# Patient Record
Sex: Male | Born: 1945 | Race: Black or African American | Hispanic: No | Smoking: Never smoker
Health system: Southern US, Community
[De-identification: ages and names within clinical notes are randomized; demographics above are authoritative.]

## PROBLEM LIST (undated history)

## (undated) DIAGNOSIS — G35D Multiple sclerosis, unspecified: Secondary | ICD-10-CM

## (undated) DIAGNOSIS — H409 Unspecified glaucoma: Secondary | ICD-10-CM

## (undated) DIAGNOSIS — F329 Major depressive disorder, single episode, unspecified: Secondary | ICD-10-CM

## (undated) DIAGNOSIS — I10 Essential (primary) hypertension: Secondary | ICD-10-CM

## (undated) DIAGNOSIS — B20 Human immunodeficiency virus [HIV] disease: Secondary | ICD-10-CM

## (undated) DIAGNOSIS — F32A Depression, unspecified: Secondary | ICD-10-CM

## (undated) DIAGNOSIS — Z21 Asymptomatic human immunodeficiency virus [HIV] infection status: Secondary | ICD-10-CM

## (undated) DIAGNOSIS — E781 Pure hyperglyceridemia: Secondary | ICD-10-CM

## (undated) DIAGNOSIS — R748 Abnormal levels of other serum enzymes: Secondary | ICD-10-CM

## (undated) DIAGNOSIS — M1990 Unspecified osteoarthritis, unspecified site: Secondary | ICD-10-CM

## (undated) DIAGNOSIS — Z8739 Personal history of other diseases of the musculoskeletal system and connective tissue: Secondary | ICD-10-CM

## (undated) DIAGNOSIS — I219 Acute myocardial infarction, unspecified: Secondary | ICD-10-CM

## (undated) DIAGNOSIS — G35 Multiple sclerosis: Secondary | ICD-10-CM

## (undated) DIAGNOSIS — F419 Anxiety disorder, unspecified: Secondary | ICD-10-CM

## (undated) DIAGNOSIS — B451 Cerebral cryptococcosis: Secondary | ICD-10-CM

## (undated) DIAGNOSIS — C61 Malignant neoplasm of prostate: Secondary | ICD-10-CM

## (undated) DIAGNOSIS — D72819 Decreased white blood cell count, unspecified: Secondary | ICD-10-CM

## (undated) HISTORY — DX: Cerebral cryptococcosis: B45.1

## (undated) HISTORY — DX: Pure hyperglyceridemia: E78.1

## (undated) HISTORY — PX: NECK SURGERY: SHX720

## (undated) HISTORY — DX: Abnormal levels of other serum enzymes: R74.8

## (undated) HISTORY — PX: PROSTATE BIOPSY: SHX241

## (undated) HISTORY — DX: Malignant neoplasm of prostate: C61

## (undated) HISTORY — DX: Decreased white blood cell count, unspecified: D72.819

---

## 2001-03-15 ENCOUNTER — Inpatient Hospital Stay (HOSPITAL_COMMUNITY): Admission: EM | Admit: 2001-03-15 | Discharge: 2001-03-16 | Payer: Self-pay | Admitting: *Deleted

## 2001-03-15 ENCOUNTER — Encounter: Payer: Self-pay | Admitting: *Deleted

## 2001-06-16 ENCOUNTER — Ambulatory Visit (HOSPITAL_COMMUNITY): Admission: RE | Admit: 2001-06-16 | Discharge: 2001-06-17 | Payer: Self-pay | Admitting: Family Medicine

## 2001-06-17 ENCOUNTER — Encounter: Payer: Self-pay | Admitting: Family Medicine

## 2003-03-16 ENCOUNTER — Ambulatory Visit (HOSPITAL_COMMUNITY): Admission: RE | Admit: 2003-03-16 | Discharge: 2003-03-16 | Payer: Self-pay | Admitting: Family Medicine

## 2003-03-16 ENCOUNTER — Encounter: Payer: Self-pay | Admitting: Family Medicine

## 2004-01-05 ENCOUNTER — Ambulatory Visit (HOSPITAL_COMMUNITY): Admission: RE | Admit: 2004-01-05 | Discharge: 2004-01-05 | Payer: Self-pay | Admitting: Internal Medicine

## 2005-03-16 ENCOUNTER — Emergency Department (HOSPITAL_COMMUNITY): Admission: EM | Admit: 2005-03-16 | Discharge: 2005-03-16 | Payer: Self-pay | Admitting: Emergency Medicine

## 2005-11-01 ENCOUNTER — Encounter (INDEPENDENT_AMBULATORY_CARE_PROVIDER_SITE_OTHER): Payer: Self-pay | Admitting: *Deleted

## 2005-11-01 ENCOUNTER — Other Ambulatory Visit: Admission: RE | Admit: 2005-11-01 | Discharge: 2005-11-01 | Payer: Self-pay | Admitting: Urology

## 2005-11-03 ENCOUNTER — Emergency Department (HOSPITAL_COMMUNITY): Admission: EM | Admit: 2005-11-03 | Discharge: 2005-11-03 | Payer: Self-pay | Admitting: Emergency Medicine

## 2006-01-03 ENCOUNTER — Emergency Department (HOSPITAL_COMMUNITY): Admission: EM | Admit: 2006-01-03 | Discharge: 2006-01-03 | Payer: Self-pay | Admitting: Emergency Medicine

## 2006-02-16 DIAGNOSIS — H469 Unspecified optic neuritis: Secondary | ICD-10-CM | POA: Insufficient documentation

## 2008-03-09 ENCOUNTER — Ambulatory Visit (HOSPITAL_COMMUNITY): Admission: RE | Admit: 2008-03-09 | Discharge: 2008-03-09 | Payer: Self-pay | Admitting: Family Medicine

## 2008-06-16 ENCOUNTER — Ambulatory Visit: Payer: Self-pay | Admitting: Gastroenterology

## 2008-06-16 LAB — CONVERTED CEMR LAB
ALT: 112 units/L — ABNORMAL HIGH (ref 0–53)
AST: 86 units/L — ABNORMAL HIGH (ref 0–37)
Absolute CD4: 297 #/uL — ABNORMAL LOW (ref 381–1469)
Albumin: 4.3 g/dL (ref 3.5–5.2)
Alkaline Phosphatase: 84 units/L (ref 39–117)
Anti Nuclear Antibody(ANA): NEGATIVE
Bilirubin, Direct: 0.1 mg/dL (ref 0.0–0.3)
CD4 T Helper %: 26 % — ABNORMAL LOW (ref 32–62)
Ferritin: 252 ng/mL (ref 22–322)
HCV Ab: NEGATIVE
Hep A Total Ab: NEGATIVE
Hepatitis B Surface Ag: NEGATIVE
Hepatitis B-Post: 5 milliintl units/mL
IgA: 398 mg/dL — ABNORMAL HIGH (ref 68–378)
IgG (Immunoglobin G), Serum: 1100 mg/dL (ref 694–1618)
IgM, Serum: 104 mg/dL (ref 60–263)
Indirect Bilirubin: 0.2 mg/dL (ref 0.0–0.9)
Iron: 98 ug/dL (ref 42–165)
Saturation Ratios: 23 % (ref 20–55)
TIBC: 418 ug/dL (ref 215–435)
Total Bilirubin: 0.3 mg/dL (ref 0.3–1.2)
Total Lymphocytes %: 44 % (ref 12–46)
Total Protein: 7.5 g/dL (ref 6.0–8.3)
Total lymphocyte count: 1144 cells/mcL (ref 700–3300)
UIBC: 320 ug/dL
WBC, lymph enumeration: 2.6 10*3/uL — ABNORMAL LOW (ref 4.0–10.5)

## 2008-06-23 ENCOUNTER — Ambulatory Visit: Payer: Self-pay | Admitting: Gastroenterology

## 2008-06-23 ENCOUNTER — Encounter: Payer: Self-pay | Admitting: Gastroenterology

## 2008-06-23 ENCOUNTER — Ambulatory Visit (HOSPITAL_COMMUNITY): Admission: RE | Admit: 2008-06-23 | Discharge: 2008-06-23 | Payer: Self-pay | Admitting: Gastroenterology

## 2008-06-23 HISTORY — PX: ESOPHAGOGASTRODUODENOSCOPY: SHX1529

## 2008-12-13 ENCOUNTER — Encounter: Payer: Self-pay | Admitting: Gastroenterology

## 2009-01-04 ENCOUNTER — Encounter: Payer: Self-pay | Admitting: Gastroenterology

## 2009-02-11 ENCOUNTER — Encounter (INDEPENDENT_AMBULATORY_CARE_PROVIDER_SITE_OTHER): Payer: Self-pay | Admitting: *Deleted

## 2009-02-14 DIAGNOSIS — Z8719 Personal history of other diseases of the digestive system: Secondary | ICD-10-CM | POA: Insufficient documentation

## 2009-02-14 DIAGNOSIS — H547 Unspecified visual loss: Secondary | ICD-10-CM | POA: Insufficient documentation

## 2009-02-14 DIAGNOSIS — M7150 Other bursitis, not elsewhere classified, unspecified site: Secondary | ICD-10-CM | POA: Insufficient documentation

## 2009-02-14 DIAGNOSIS — R7401 Elevation of levels of liver transaminase levels: Secondary | ICD-10-CM | POA: Insufficient documentation

## 2009-02-14 DIAGNOSIS — R1030 Lower abdominal pain, unspecified: Secondary | ICD-10-CM | POA: Insufficient documentation

## 2009-02-14 DIAGNOSIS — K5900 Constipation, unspecified: Secondary | ICD-10-CM | POA: Insufficient documentation

## 2009-02-14 DIAGNOSIS — I10 Essential (primary) hypertension: Secondary | ICD-10-CM | POA: Insufficient documentation

## 2009-02-14 DIAGNOSIS — R740 Nonspecific elevation of levels of transaminase and lactic acid dehydrogenase [LDH]: Secondary | ICD-10-CM

## 2009-02-14 DIAGNOSIS — K921 Melena: Secondary | ICD-10-CM | POA: Insufficient documentation

## 2009-02-14 DIAGNOSIS — B20 Human immunodeficiency virus [HIV] disease: Secondary | ICD-10-CM | POA: Insufficient documentation

## 2009-02-14 DIAGNOSIS — R109 Unspecified abdominal pain: Secondary | ICD-10-CM | POA: Insufficient documentation

## 2009-02-15 ENCOUNTER — Ambulatory Visit: Payer: Self-pay | Admitting: Gastroenterology

## 2009-02-15 DIAGNOSIS — K625 Hemorrhage of anus and rectum: Secondary | ICD-10-CM | POA: Insufficient documentation

## 2009-02-18 ENCOUNTER — Ambulatory Visit (HOSPITAL_COMMUNITY): Admission: RE | Admit: 2009-02-18 | Discharge: 2009-02-18 | Payer: Self-pay | Admitting: Psychiatry

## 2009-03-10 ENCOUNTER — Ambulatory Visit (HOSPITAL_COMMUNITY): Admission: RE | Admit: 2009-03-10 | Discharge: 2009-03-10 | Payer: Self-pay | Admitting: Gastroenterology

## 2009-03-10 ENCOUNTER — Ambulatory Visit: Payer: Self-pay | Admitting: Gastroenterology

## 2009-03-10 ENCOUNTER — Encounter: Payer: Self-pay | Admitting: Gastroenterology

## 2009-03-10 HISTORY — PX: COLONOSCOPY: SHX174

## 2009-03-14 ENCOUNTER — Encounter (INDEPENDENT_AMBULATORY_CARE_PROVIDER_SITE_OTHER): Payer: Self-pay

## 2009-09-05 ENCOUNTER — Ambulatory Visit (HOSPITAL_COMMUNITY): Admission: RE | Admit: 2009-09-05 | Discharge: 2009-09-05 | Payer: Self-pay | Admitting: Family Medicine

## 2010-07-18 NOTE — Letter (Signed)
Summary: TCS ORDER  TCS ORDER   Imported By: Ave Filter 02/15/2009 13:28:21  _____________________________________________________________________  External Attachment:    Type:   Image     Comment:   External Document

## 2010-07-18 NOTE — Letter (Signed)
Summary: OFFICE  NOTES/LABS/[facility]  OFFICE  NOTES/[facility]S/[facility]   Imported By: Diana Eves 01/04/2009 11:02:31  _____________________________________________________________________  External Attachment:    Type:   Image     Comment:   External Document

## 2010-07-18 NOTE — Medication Information (Signed)
Summary: Kerry Solis rx  Kerry Solis rx   Imported By: Elinor Parkinson 12/13/2008 10:40:34  _____________________________________________________________________  External Attachment:    Type:   Image     Comment:   External Document  Appended Document: [name] rx - omeprazole    Prescriptions: OMEPRAZOLE 20 MG CPDR (OMEPRAZOLE) one by mouth 30 mins before breakfast  #30 x 11   Entered and Authorized by:   Leanna Battles. Dixon Boos   Signed by:   Leanna Battles Dixon Boos on 12/14/2008   Method used:   Electronically to        [facility] (retail)       [address]       [place], [place]  [address]       Ph: [phone]       Fax: [phone]   RxID:   [id number]

## 2010-07-18 NOTE — Letter (Signed)
Summary: Patient Notice, Colon Biopsy Results  [facility]  [address]   [place], [place] [address]   Phone: [phone]  Fax: [phone]       March 14, 2009   Kerry Solis [address] [place], [place]  [address] 11-22-1945    Dear [name],  I am pleased to inform you that the biopsies taken during your recent colonoscopy did not show any evidence of cancer upon pathologic examination.  Additional information/recommendations:   __You should have a repeat colonoscopy examination  in 5 years.  Please call us if you are having persistent problems or have questions about your condition that have not been fully answered at this time.  Sincerely,  Hendricks Limes LPN  [facility] Ph: [phone]    Fax: [phone]

## 2010-07-18 NOTE — Letter (Signed)
Summary: Generic Letter, Intro to Referring  [facility]  [address]   [place], [place] [address]   Phone: [phone]  Fax: [phone]      February 11, 2009             RE: Kerry Solis   1945-07-01                 [address]                 [place], [place]  [address]  Dear Appt Secretary,   This pt has been scheduled an appt for 02/15/2009 @ 10:00 w/ Tana Coast, PA.   Lmom with appt.          Sincerely,    Elinor Parkinson  [facility] Ph: [phone]   Fax: [phone]

## 2010-07-18 NOTE — Assessment & Plan Note (Signed)
Summary: ABD PAIN,CONSTIPATION,HEME+STOOL,CONSULT FOR COLONOSCOPY/AS   Visit Type:  f/u Referring Provider:  Lilyan Punt Primary Care Provider:  Lilyan Punt  Chief Complaint:  abd pain/constipation/blood in stool.  History of Present Illness: Patient is here at the request of Dr. Lilyan Punt for further evaluation of recent blood in the stool and constipation/abdominal pain. He had one episode of blood in the stool on August 24. He had been having some constipation and mild lower abdominal pain for about one week prior to this episode. Since this episode his stools have been soft and these had no further bleeding. He generally drinks milk to facilitate bowel movements. He has some mild lactose intolerance. His family history significant for colon cancer in 2 brothers. We tried to triage him for colonoscopy on February 02, 2009 but at that time he was not appear to schedule. His last colonoscopy was in 2005 by Dr. Karilyn Cota. He had 2 small diverticula in the sigmoid colon and external hemorrhoids.  He denies any problems swallowing, anorexia, heartburn, vomiting, melena. He continues to take naproxen every few days for his shoulder pain or leg pain. He no longer takes Advil on a regular basis. He takes aspirin every day.  Current Medications (verified): 1)  Omeprazole 20 Mg Cpdr (Omeprazole) .... One By Mouth 30 Mins Before Breakfast 2)  Alprazolam 0.25 Mg Tabs (Alprazolam) .... Two Times A Day As Needed 3)  Hydrocodone-Acetaminophen 5-500 Mg Tabs (Hydrocodone-Acetaminophen) .... Every 4 Hours As Needed 4)  Naproxen 500 Mg Tabs (Naproxen) .... Two Times A Day 5)  Lisinopril 5 Mg Tabs (Lisinopril) .... As Directed 6)  Amitriptyline Hcl 25 Mg Tabs (Amitriptyline Hcl) .... Take 2 At Bedtime 7)  Vitamin B-6 100 Mg Tabs (Pyridoxine Hcl) .... Once Daily 8)  Hydrochlorothiazide 25 Mg Tabs (Hydrochlorothiazide) .... Once Daily 9)  Multivitamins  Tabs (Multiple Vitamin) .... Once Daily 10)  Folic Acid  400 Mcg Tabs (Folic Acid) .... Once Daily 11)  Aspirin 81 Mg Tabs (Aspirin) .... Once Daily 12)  Advil 200 Mg Tabs (Ibuprofen) .... As Needed 13)  Rebif 22 Mcg/0.78ml Soln (Interferon Beta-1a) .... Take 3 Times A Week 14)  Fish Oil 1000 Mg .... Take Two Tablets in The Am 15)  Allopurinol 300 Mg Tabs (Allopurinol) .... Take 1 Tablet By Mouth Once A Day 16)  Atripla 600-200-300 Mg Tabs (Efavirenz-Emtricitab-Tenofovir) .... 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 Cira Servant, patent Schatzki ring, moderate gastritis with small ulceration, path negative for H.Pylori and c/w chronic gastritis. Gallstones Right shoulder bursitis  Family History: 2 brothers with colon cancer, multiple family members with colon polyps. Sister with liver disease.  Social History: Marital Status:Divorced No tob, alcohol, drug use.  Review of Systems General:  Denies fever, chills, sweats, fatigue, weakness, and weight loss. Eyes:  Complains of vision loss; chronic left eye. ENT:  Denies nasal congestion, loss of smell, hoarseness, and difficulty swallowing. CV:  Denies chest pains, angina, palpitations, dyspnea on exertion, and peripheral edema. Resp:  Denies dyspnea at rest, dyspnea with exercise, and cough. GI:  See HPI. GU:  Denies urinary burning and blood in urine. MS:  Complains of joint pain / LOM and shoulder pain / LOM hand / wrist pain (CTS). Derm:  Denies rash and itching. Neuro:  Complains  of weakness; denies paralysis, frequent headaches, memory loss, and confusion. Psych:  Denies depression and anxiety. Endo:  Denies unusual weight change. Heme:  Complains of bleeding; denies bruising. Allergy:  Denies hives.  Vital Signs:  Patient  profile:   65 year old male Height:      69 inches Weight:      194 pounds BMI:     28.75 Temp:     97.9 degrees F oral Pulse rate:   72 / minute BP sitting:   140 / 90  (left arm) Cuff size:   regular  Vitals Entered By: Cloria Spring LPN (February 15, 2009 9:57 AM)  Physical Exam  General:  Well developed, well nourished, no acute distress. Head:  Normocephalic and atraumatic. Eyes:  Conjunctivae pink, no scleral icterus.  Mouth:  Oropharyngeal mucosa moist, pink.  No lesions, erythema or exudate.    Neck:  Supple; no masses or thyromegaly. Lungs:  Clear throughout to auscultation. Heart:  Regular rate and rhythm; no murmurs, rubs,  or bruits. Abdomen:  Bowel sounds normal.  Abdomen is soft, nontender, nondistended.  No rebound or guarding.  No hepatosplenomegaly, masses or hernias.  No abdominal bruits.  Extremities:  No clubbing, cyanosis, edema or deformities noted. Neurologic:  Alert and  oriented x4;  grossly normal neurologically. Skin:  Intact without significant lesions or rashes. Psych:  Alert and cooperative. Normal mood and affect.  Impression & Recommendations:  Problem # 1:  RECTAL BLEEDING (ICD-569.3)  recent rectal bleeding, single episode, in the setting of constipation. Constipation and abdominal pain have resolved. Family history significant for colon cancer in 2 first-degree relatives. Patient is due for high risk screening colonoscopy. Given recent events would pursue diagnostic colonoscopy at this time. He likely had bleeding from benign source such as hemorrhoids or fissure.  Colonoscopy to be performed in near future.  Risks, alternatives, and benefits including but not limited to the risk of reaction to medication, bleeding, infection, and perforation were addressed.  Patient voiced understanding and provided verbal consent.   Orders: Est. Patient Level IV ([address])  Problem # 2:  CONSTIPATION (ICD-564.00)  encouraged high fiber diet. He may use Colace 100  mg b.i.d. as needed for MiraLax 17 g daily p.r.n.  Orders: Est. Patient Level IV ([address])  Problem # 3:  LIVER FUNCTION TESTS, ABNORMAL, HX OF (ICD-V12.2)  he has history of elevated transaminases. He is due for recheck now. He likely has increase in transaminases secondary to steatosis and/or change in his antiviral medication for HIV. Labs from December 2009 showed lack of immunity for hepatitis A or hepatitis B. Patient does not recall whether or not he completed hepatitis B vaccination previously, however his hepatitis B surface antibody level was only 5 (level greater than 10 suggest immunity). He has an upcoming appointment at [facility] for followup of his HIV. I've asked that he verify whether or not he has been vaccinated previously for hepatitis B and if he has underwent vaccination to discuss whether or not he should be revaccinated. He could have vaccination done through his local PCP for hepatitis A and B. if needed.  Orders: Est. Patient Level IV ([address]) T-Hepatic Function ([id number]) CC:  Dr. Demetria Pore at [facility]  Appended Document: ABD PAIN,CONSTIPATION,HEME+STOOL,CONSULT FOR COLONOSCOPY/AS called pt to get him to have labs done. he stated he just had them done at Dr. Cathlyn Parsons office and at [facility]. He stated he would call and have them send [place] a copy to see if he needed it  done again.   Appended Document: ABD PAIN,CONSTIPATION,HEME+STOOL,CONSULT FOR COLONOSCOPY/AS Please f/u.  We have not received labs from PCP or [facility].  Let's call for most recent labs.  Appended Document: ABD PAIN,CONSTIPATION,HEME+STOOL,CONSULT FOR COLONOSCOPY/AS Labs received from PCP were old.  Patient says he had bw done at [facility] in the last two weeks.  Let's see if we can get a copy from them.  He says they are taking care of the hep a and b vaccines at [facility].  Appended Document: ABD PAIN,CONSTIPATION,HEME+STOOL,CONSULT FOR COLONOSCOPY/AS I've requested labs from [facility] again. Sent request as ASAP  Appended  Document: ABD PAIN,CONSTIPATION,HEME+STOOL,CONSULT FOR COLONOSCOPY/AS I called pt to let him know that I needed a signed release of info in order to get [facility] records sent to [place]. He told me to call "Dr Lorin Picket" that he had everything that I needed on him. I called Dr Fletcher Anon office and they are not able to release records that didnt originate from their practice.Marland KitchenMarland KitchenI instead mailed Mr Bottger a release for him to sign.

## 2010-10-31 NOTE — Consult Note (Signed)
NAME:  Kerry Solis, Kerry Solis               ACCOUNT NO.:  [id number]   MEDICAL RECORD NO.:  [id number]          PATIENT TYPE:  AMB   LOCATION:  DAY                           FACILITY:  [facility]   PHYSICIAN:  Kassie Mends, M.D.      DATE OF BIRTH:  1945-08-01   DATE OF CONSULTATION:  DATE OF DISCHARGE:                                 CONSULTATION   REFERRING PHYSICIAN:  Scott A. Luking, MD.   REASON FOR CONSULTATION:  Abdominal pain and elevated liver enzymes.   HISTORY OF PRESENT ILLNESS:  Kerry Solis is a 65 year old male who was  diagnosed with HIV in 1998.  He is currently on antiretroviral therapy.  He reports that his CD4 count was greater than 200.  He has been having  problems with his teeth and when he has pain in his belly, he needs to  eat to make it go away.  He denies any nausea, vomiting, or problems of  swallowing.  He reports having a colonoscopy 2 years ago at [facility]  He denies any rectal bleeding or blood in his stool.  The abdominal pain is a soreness which is located around his navel.  It  does not radiate.  He has had the symptoms for 5-6 months.  He describes  them as a little worrisome due to the pain being more diffuse.  He has  1-2 bowel movements a day which are hard and he has to strain to get  them out.  Denies any heartburn or indigestion.  He does sometimes get a  burning sensation if he is hungry and when he eats, it gets better.  He  is on aspirin.  He uses a 2-8 Advil daily.  He is also taking naproxen.  He has a known history of an AST of 49 and an ALT of 42 in 2002.  In  2006, he had abdominal imaging, showed sigmoid colon diverticulosis.   PAST MEDICAL HISTORY:  1. Gallstones.  2. Right shoulder bursitis causing recurrent right shoulder pain.  3. 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  4. Hypertension.   PAST SURGICAL HISTORY:  None.   ALLERGIES:  No known drug allergies.   MEDICATIONS:  1. Fluconazole  200 mg daily.  2. Roxicet 5/325  daily.  3. Alprazolam.  4. Hydrocodone 5/500 every 4 hours as need.  5. Naproxen 500 mg b.i.d.  6. Lisinopril 2.5 mg daily.  7. Amitriptyline 50 mg every night.  8. Vitamin B6.  9. Hydrochlorothiazide 25 mg daily.  10.Multivitamin.  11.Folic acid.  12.Advil 2-8 per day.  13.Aspirin 81 mg daily.  14.Norvir 100 mg daily.  15.Rebif injection 3 times a week.   FAMILY HISTORY:  His sister had cystic liver disease.  He reports having  2 brothers with colon cancer.  He has a family history of colon polyps.   SOCIAL HISTORY:  He is divorced.  He used to work in Engineering geologist.  He has  never smoked.  He states he drinks a beer to help him go to sleep.  His  last consumption was a month  ago.  He denies any blood transfusion, IV  drug use, or snorting cocaine.   PHYSICAL EXAMINATION:  VITAL SIGNS:  Weight 260 pounds, height 55.9  inches, BMI 30.4 (obese ) temperature 97.9, blood pressure 142/90, and  pulse 80.  GENERAL:  He is in no apparent distress.  He is alert and oriented  x4.HEENT:  Atraumatic and normocephalic.  Pupils equal, react to light.  Mouth, no oral lesions.  Posterior oropharynx without erythema or  exudate.NECK:  Full range of ration.  No lymphadenopathy.LUNGS:  Clear  to auscultation bilaterally.  CARDIOVASCULAR:  Regular rhythm.  No murmur.  Normal S1 and S2.ABDOMEN:  He has mild tenderness to palpation in the epigastrium without rebound  or guarding.  EXTREMITIES:  No cyanosis or edema.NEUROLOGIC:  No focal neurologic  deficit.   LABORATORY DATA:  In November 2009, AST is 76 ( 0-37), ALT 88 (0-53),  albumin 4.4, total bilirubin 0.3, and alk phos is 79.   RADIOGRAPHIC STUDIES:  In September 2009, abdominal ultrasound showed  fatty liver disease and multiple gallstones.  Common bile duct is 2.7  mm.   ASSESSMENT:  Kerry Solis is a 65 year old male who has evidence of  hepatic steatosis on ultrasound and mildly elevated liver enzymes.  He  has steatohepatitis likely  secondary to multiple factors.  The factors  include:  periodic alcohol use, obesity, and chronic antiretroviral  therapy. Thank you for allowing me to see Mr. Wileman in consultation.  My recommendations follow.   RECOMMENDATION:  1. For his complaint of abdominal pain which was related by food and      the likelihood of H. pylori gastritis or peptic ulcer disease and a      low likelihood of gastric malignancy, he will be scheduled for an      EGD with biopsy next week.  2. He is to add omeprazole 20 mg daily.  3. I did advise him that he should discontinue the use of Advil.  4. He may continue the aspirin and the naproxen until his evaluation      for his abdominal pain is complete.  5. Will check hepatitis C antibody, hepatitis A, IgG, and hepatitis B      surface antigen.  He also will have quantitative immunoglobulin and      ANA and anti-smooth muscle antibody.  6. He has a follow up appointment to see me in 2 months.  7. He has 2 first degree relatives with colon cancer, and he should      have a screening colonoscopy every 5 years. He dclined to have a      colonoscopy at this time.  8. He also has constipation and he is given discharge instructions on      the management of constipation in a handout.  He is asked to drink      6-8 cups of water daily.  He should follow a high-fiber diet.  He      is given a      handout on a high-fiber diet.  He is asked to use Fibersure 2 times      a day.  He may add milk of magnesia or MiraLax once or twice a day      if he is not having a satisfactory bowel movement.  I also checked      his CD4 count.  I will obtain records from [facility] as well      as [facility]  Kassie Mends, M.D.  Electronically Signed     SM/MEDQ  D:  06/16/2008  T:  06/17/2008  Job:  [id number]   cc:   Lorin Picket A. Gerda Diss, MD  Fax: [phone]

## 2010-10-31 NOTE — Op Note (Signed)
NAME:  Kerry Solis, Kerry Solis               ACCOUNT NO.:  [id number]   MEDICAL RECORD NO.:  [id number]          PATIENT TYPE:  AMB   LOCATION:  DAY                           FACILITY:  [facility]   PHYSICIAN:  Kassie Mends, M.D.      DATE OF BIRTH:  [birth year]   DATE OF PROCEDURE:  06/23/2008  DATE OF DISCHARGE:                               OPERATIVE REPORT   REFERRING PHYSICIAN:  Scott A. Gerda Diss, MD   PROCEDURE:  Esophagogastroduodenoscopy with cold forceps biopsy.   INDICATIONS FOR EXAM:  Kerry Solis is a 65 year old male who presents  with abdominal pain, which is located in the center of his abdomen.  He  uses aspirin, Advil, and naproxen.  He occasionally consumes alcohol.  He has also seen as an outpatient for elevated liver enzymes with an AST  of 49 and ALT of 42 in 2002 and repeat liver enzymes in November 2009  showed an AST of 76 and an ALT of 88.  This lab evaluation included an  iron saturation of 23, total bili 0.3, alk phos 84, AST 86, ALT 112,  albumin 4.3, ferritin 252.  Hepatitis B surface antigen negative.  Total  hepatitis A antibody negative.  Hepatitis C antibody negative.  Hepatitis B surface antibody 5.0.  IgG 1100.  IgA slightly elevated at  398 (see 68-378), IgM 104.  ANA negative.  Absolute CD4 count 297.  Smooth muscle antibody IgG less than 20.   FINDINGS:  1. Patent Schatzki ring.  Otherwise, no evidence of Barrett's, mass,      erosions, or ulcerations in the esophagus.  2. Multiple gastric erosions and small ulcers seen in the proximal      stomach and in the antrum.  There was evidence of active oozing.      Biopsies were obtained via cold forceps to evaluate for H. Pylori      gastritis.  3. Mild erythema at the junction of D1 and D2.  Otherwise, the      duodenal bulb and the second portion of the duodenum were without      ulceration or erosion.   DIAGNOSES:  1. Moderate gastritis.  2. Patent Schatzki ring.   RECOMMENDATIONS:  1. He should continue  omeprazole indefinitely.  He is asked not to      take Advil or naproxen until July 08, 2008.  He should hold his      aspirin until July 25, 2008.  2. He should avoid alcohol completely and we will recheck his liver      enzymes when he follows up in 8 weeks for his abdominal pain.  If      his liver enzyme elevation persist, then we would consider liver      biopsy, although I think that he has steatohepatitis secondary to      obesity and anti-retroviral therapy.  3. It is okay for him to use Tylenol 650 mg every 6 hours as needed      for pain.   MEDICATIONS:  1. Demerol 50 mg IV.  2. Versed 4 mg IV.  PROCEDURE TECHNIQUE:  Physical exam was performed.  Informed consent was  obtained from the patient after explaining the benefits, risks, and  alternatives to the procedure.  The patient was connected to monitor and  placed in left lateral position.  Continuous oxygen was provided by  nasal cannula and IV medicine administered through an indwelling  cannula.  After administration of sedation, the patient's esophagus was  intubated and scope was advanced under direct visualization to the  second portion of the duodenum.  The scope was removed slowly by  carefully examining the color, texture, anatomy, and integrity of the  mucosa on the way out.  The patient was recovered in endoscopy and  discharged home in satisfactory condition.   PATH:  Chronic gastritis.      Kassie Mends, M.D.  Electronically Signed     SM/MEDQ  D:  06/23/2008  T:  06/23/2008  Job:  [id number]   cc:   Lorin Picket A. Gerda Diss, MD  Fax: [phone]   [facility]

## 2010-11-03 NOTE — Op Note (Signed)
NAME:  Kerry Solis, Kerry Solis                         ACCOUNT NO.:  [id number]   MEDICAL RECORD NO.:  [id number]                   PATIENT TYPE:  AMB   LOCATION:  DAY                                  FACILITY:  [facility]   PHYSICIAN:  Lionel December, M.D.                 DATE OF BIRTH:  1945/12/15   DATE OF PROCEDURE:  01/05/2004  DATE OF DISCHARGE:                                 OPERATIVE REPORT   PROCEDURE:  Total colonoscopy.   INDICATIONS:  Kerry Solis is a 65 year old African American male with a recent  history of constipation who has responded to a high fiber diet and Colace.  He is undergoing high risk colonoscopy.  His last exam was in 1997.  The  family history is positive for colon carcinoma in two of his older brothers.  One is deceased.   The procedure is reviewed with the patient.  Informed consent was obtained.  He has a family history of colon carcinoma.  Two of his brothers have had  this diagnosis.  He also has intermittent hematochezia felt to be secondary  to hemorrhoids.  The procedure is reviewed with the patient.  Informed  consent was obtained.   PREOPERATIVE MEDICATIONS:  Demerol 50 mg IV, Versed 5 mg IV.   FINDINGS:  The procedure is performed in the endoscopy suite.  The patient's  vital signs and O2 saturations were monitored during the procedure and  remained stable.  The patient was placed in the left lateral decubitus  position and the rectal exam was performed.  No abnormality noted on  external or digital exam.  The Olympus video scope was placed in the rectum  and advanced under direct vision into the sigmoid colon and beyond.  The  preparation was satisfactory.  Two small diverticula were noted at the  sigmoid colon.  The scope was advanced to the cecum which was identified by  the ileocecal valve and appendiceal orifice.  Pictures were taken for the  record.  As the scope was withdrawn, the colonic mucosa was once again  carefully examined.  There were no polyps  and/or tumor masses.  The rectal  mucosa was normal.  The scope was retroflexed to examine the anorectal  junction and small hemorrhoids were noted below the dentate line.  The  endoscope was straightened and withdrawn.  The patient tolerated the  procedure well.   FINAL DIAGNOSIS:  Two small diverticula at the sigmoid colon and external  hemorrhoids, otherwise normal colonoscopy.   RECOMMENDATIONS:  He should continue high fiber diet and Colace as before.  He should consider having the next screening exam in five years from now  given positive family history.      ___________________________________________  Lionel December, M.D.   NR/MEDQ  D:  01/05/2004  T:  01/05/2004  Job:  [id number]   cc:   Lorin Picket A. Gerda Diss, M.D.  [address]  [place]  [place] [address]  Fax: [phone]

## 2011-06-06 ENCOUNTER — Emergency Department (HOSPITAL_COMMUNITY): Payer: Medicare Other

## 2011-06-06 ENCOUNTER — Emergency Department (HOSPITAL_COMMUNITY)
Admission: EM | Admit: 2011-06-06 | Discharge: 2011-06-06 | Disposition: A | Discharge status: Home / Self Care | Payer: Medicare Other | Attending: Emergency Medicine | Admitting: Emergency Medicine

## 2011-06-06 ENCOUNTER — Encounter: Payer: Self-pay | Admitting: Emergency Medicine

## 2011-06-06 DIAGNOSIS — Z79899 Other long term (current) drug therapy: Secondary | ICD-10-CM | POA: Insufficient documentation

## 2011-06-06 DIAGNOSIS — G35 Multiple sclerosis: Secondary | ICD-10-CM | POA: Insufficient documentation

## 2011-06-06 DIAGNOSIS — I10 Essential (primary) hypertension: Secondary | ICD-10-CM | POA: Insufficient documentation

## 2011-06-06 DIAGNOSIS — K8020 Calculus of gallbladder without cholecystitis without obstruction: Secondary | ICD-10-CM | POA: Insufficient documentation

## 2011-06-06 DIAGNOSIS — K5730 Diverticulosis of large intestine without perforation or abscess without bleeding: Secondary | ICD-10-CM | POA: Insufficient documentation

## 2011-06-06 DIAGNOSIS — R109 Unspecified abdominal pain: Secondary | ICD-10-CM | POA: Insufficient documentation

## 2011-06-06 DIAGNOSIS — Z862 Personal history of diseases of the blood and blood-forming organs and certain disorders involving the immune mechanism: Secondary | ICD-10-CM

## 2011-06-06 DIAGNOSIS — K7689 Other specified diseases of liver: Secondary | ICD-10-CM | POA: Insufficient documentation

## 2011-06-06 DIAGNOSIS — B20 Human immunodeficiency virus [HIV] disease: Secondary | ICD-10-CM | POA: Insufficient documentation

## 2011-06-06 HISTORY — DX: Asymptomatic human immunodeficiency virus (hiv) infection status: Z21

## 2011-06-06 HISTORY — DX: Human immunodeficiency virus (HIV) disease: B20

## 2011-06-06 HISTORY — DX: Multiple sclerosis, unspecified: G35.D

## 2011-06-06 HISTORY — DX: Essential (primary) hypertension: I10

## 2011-06-06 HISTORY — DX: Multiple sclerosis: G35

## 2011-06-06 LAB — DIFFERENTIAL
Basophils Absolute: 0 10*3/uL (ref 0.0–0.1)
Basophils Relative: 0 % (ref 0–1)
Eosinophils Absolute: 0 10*3/uL (ref 0.0–0.7)
Eosinophils Relative: 1 % (ref 0–5)
Lymphocytes Relative: 20 % (ref 12–46)
Lymphs Abs: 0.9 10*3/uL (ref 0.7–4.0)
Monocytes Absolute: 0.4 10*3/uL (ref 0.1–1.0)
Monocytes Relative: 9 % (ref 3–12)
Neutro Abs: 3.2 10*3/uL (ref 1.7–7.7)
Neutrophils Relative %: 71 % (ref 43–77)

## 2011-06-06 LAB — COMPREHENSIVE METABOLIC PANEL
ALT: 64 U/L — ABNORMAL HIGH (ref 0–53)
AST: 72 U/L — ABNORMAL HIGH (ref 0–37)
Albumin: 4 g/dL (ref 3.5–5.2)
Alkaline Phosphatase: 100 U/L (ref 39–117)
BUN: 18 mg/dL (ref 6–23)
CO2: 26 mEq/L (ref 19–32)
Calcium: 10.1 mg/dL (ref 8.4–10.5)
Chloride: 102 mEq/L (ref 96–112)
Creatinine, Ser: 1 mg/dL (ref 0.50–1.35)
GFR calc Af Amer: 88 mL/min — ABNORMAL LOW (ref >90)
GFR calc non Af Amer: 76 mL/min — ABNORMAL LOW (ref >90)
Glucose, Bld: 157 mg/dL — ABNORMAL HIGH (ref 70–99)
Potassium: 3.7 mEq/L (ref 3.5–5.1)
Sodium: 138 mEq/L (ref 135–145)
Total Bilirubin: 0.2 mg/dL — ABNORMAL LOW (ref 0.3–1.2)
Total Protein: 8.2 g/dL (ref 6.0–8.3)

## 2011-06-06 LAB — URINALYSIS, ROUTINE W REFLEX MICROSCOPIC
Bilirubin Urine: NEGATIVE
Glucose, UA: NEGATIVE mg/dL
Hgb urine dipstick: NEGATIVE
Ketones, ur: NEGATIVE mg/dL
Leukocytes, UA: NEGATIVE
Nitrite: NEGATIVE
Protein, ur: NEGATIVE mg/dL
Specific Gravity, Urine: 1.015 (ref 1.005–1.030)
Urobilinogen, UA: 0.2 mg/dL (ref 0.0–1.0)
pH: 7 (ref 5.0–8.0)

## 2011-06-06 LAB — CBC
HCT: 39 % (ref 39.0–52.0)
Hemoglobin: 12.9 g/dL — ABNORMAL LOW (ref 13.0–17.0)
MCH: 30.4 pg (ref 26.0–34.0)
MCHC: 33.1 g/dL (ref 30.0–36.0)
MCV: 91.8 fL (ref 78.0–100.0)
Platelets: 214 10*3/uL (ref 150–400)
RBC: 4.25 MIL/uL (ref 4.22–5.81)
RDW: 14.7 % (ref 11.5–15.5)
WBC: 4.5 10*3/uL (ref 4.0–10.5)

## 2011-06-06 LAB — LIPASE, BLOOD: Lipase: 49 U/L (ref 11–59)

## 2011-06-06 MED ORDER — FAMOTIDINE IN NACL 20-0.9 MG/50ML-% IV SOLN
20.0000 mg | Freq: Once | INTRAVENOUS | Status: AC
  Administered 2011-06-06: 20 mg via INTRAVENOUS
  Filled 2011-06-06: qty 50

## 2011-06-06 MED ORDER — HYDROMORPHONE HCL PF 1 MG/ML IJ SOLN
1.0000 mg | Freq: Once | INTRAMUSCULAR | Status: AC
  Administered 2011-06-06: 1 mg via INTRAVENOUS
  Filled 2011-06-06: qty 1

## 2011-06-06 MED ORDER — ONDANSETRON HCL 4 MG/2ML IJ SOLN
4.0000 mg | Freq: Once | INTRAMUSCULAR | Status: AC
  Administered 2011-06-06: 4 mg via INTRAVENOUS
  Filled 2011-06-06: qty 2

## 2011-06-06 MED ORDER — ONDANSETRON HCL 4 MG PO TABS: 8.0000 mg | ORAL_TABLET | Freq: Four times a day (QID) | ORAL | Status: AC

## 2011-06-06 MED ORDER — IOHEXOL 300 MG/ML  SOLN
100.0000 mL | Freq: Once | INTRAMUSCULAR | Status: AC | PRN
  Administered 2011-06-06: 100 mL via INTRAVENOUS

## 2011-06-06 MED ORDER — SODIUM CHLORIDE 0.9 % IV SOLN: 999.0000 mL | INTRAVENOUS | Status: DC

## 2011-06-06 MED ORDER — MORPHINE SULFATE 4 MG/ML IJ SOLN
4.0000 mg | Freq: Once | INTRAMUSCULAR | Status: AC
  Administered 2011-06-06: 4 mg via INTRAVENOUS
  Filled 2011-06-06: qty 1

## 2011-06-06 MED ORDER — OXYCODONE-ACETAMINOPHEN 5-325 MG PO TABS: 2.0000 | ORAL_TABLET | ORAL | Status: DC | PRN

## 2011-06-06 NOTE — ED Notes (Signed)
Pt states abd pain started 12/17. Pain increased 12/18 along with nausea and vomiting that started at 2200

## 2011-06-06 NOTE — ED Provider Notes (Signed)
History     CSN: [id number] Arrival date & time: 06/06/2011 12:52 AM   First MD Initiated Contact with Patient 06/06/11 0128      Chief Complaint  Patient presents with  . Abdominal Pain  . Emesis    (Consider location/radiation/quality/duration/timing/severity/associated sxs/prior treatment) HPI  Past Medical History  Diagnosis Date  . Multiple sclerosis   . Hypertension   . HIV (human immunodeficiency virus infection)     History reviewed. No pertinent past surgical history.  History reviewed. No pertinent family history.  History  Substance Use Topics  . Smoking status: Never Smoker   . Smokeless tobacco: Not on file  . Alcohol Use: No      Review of Systems  Allergies  Review of patient's allergies indicates no known allergies.  Home Medications   Current Outpatient Rx  Name Route Sig Dispense Refill  . ALLOPURINOL 300 MG PO TABS Oral Take 300 mg by mouth daily.      Marland Kitchen AMITRIPTYLINE HCL 50 MG PO TABS Oral Take 50 mg by mouth at bedtime.      . ASPIRIN 81 MG PO TABS Oral Take 81 mg by mouth daily.      Marland Kitchen CARBAMAZEPINE ER 100 MG PO CP12 Oral Take 100 mg by mouth 2 (two) times daily.      . EFAVIRENZ-EMTRICITAB-TENOFOVIR 600-200-300 MG PO TABS Oral Take 1 tablet by mouth at bedtime.      . OMEGA-3 FATTY ACIDS 1000 MG PO CAPS Oral Take 2 g by mouth daily.      Marland Kitchen FOLIC ACID 400 MCG PO TABS Oral Take 400 mcg by mouth daily.      Marland Kitchen HYDROCHLOROTHIAZIDE 25 MG PO TABS Oral Take 25 mg by mouth daily.      . INTERFERON BETA-1B 0.3 MG [place] SOLR Subcutaneous Inject 44 mg into the skin every Monday, Wednesday, and Friday.      Marland Kitchen LISINOPRIL 10 MG PO TABS Oral Take 10 mg by mouth daily.      . CENTRUM PO CHEW Oral Chew 1 tablet by mouth daily.      Marland Kitchen OMEPRAZOLE 20 MG PO CPDR Oral Take 20 mg by mouth daily.      Marland Kitchen PRAVASTATIN SODIUM 20 MG PO TABS Oral Take 20 mg by mouth daily.      Marland Kitchen PYRIDOXINE HCL 100 MG PO TABS Oral Take 100 mg by mouth daily.      Marland Kitchen ONDANSETRON HCL 4  MG PO TABS Oral Take 2 tablets (8 mg total) by mouth every 6 (six) hours. 12 tablet 0  . OXYCODONE-ACETAMINOPHEN 5-325 MG PO TABS Oral Take 2 tablets by mouth every 4 (four) hours as needed for pain. 20 tablet 0    BP 174/90  Pulse 83  Temp(Src) 98.6 F (37 C) (Oral)  Resp 18  Ht 5\' 8"  (1.727 m)  Wt 210 lb (95.255 kg)  BMI 31.93 kg/m2  SpO2 95%  Physical Exam  ED Course  Procedures (including critical care time)  Labs Reviewed  CBC - Abnormal; Notable for the following:    Hemoglobin 12.9 (*)    All other components within normal limits  COMPREHENSIVE METABOLIC PANEL - Abnormal; Notable for the following:    Glucose, Bld 157 (*)    AST 72 (*)    ALT 64 (*)    Total Bilirubin 0.2 (*)    GFR calc non Af Amer 76 (*)    GFR calc Af Amer 88 (*)    All  other components within normal limits  URINALYSIS, ROUTINE W REFLEX MICROSCOPIC - Abnormal; Notable for the following:    Color, Urine AMBER (*) BIOCHEMICALS MAY BE AFFECTED BY COLOR   All other components within normal limits  DIFFERENTIAL  LIPASE, BLOOD   US Abdomen Complete  06/06/2011  *RADIOLOGY REPORT*  Clinical Data:  Gallstones.  Abdominal pain.  COMPLETE ABDOMINAL ULTRASOUND  Comparison:  CT abdomen and pelvis 06/06/2011 5:07 a.m.  Findings:  Gallbladder:  As on CT scan, a few small stones are identified within the gallbladder.  Sludge is also present.  There is some pericholecystic fluid.  Gallbladder wall  is thickened at 0.6 cm. Sonographer reports negative Murphy's sign.  Common bile duct:  Measures 0.5 cm.  Liver:  No focal lesion identified.  Within normal limits in parenchymal echogenicity.  IVC:  Appears normal.  Pancreas:  Not visualized due to overlying bowel gas.  Spleen:  Measures 5.3 cm and appears normal.  Right Kidney:  Measures 11.0 cm and appears normal.  Left Kidney:  Measures 10.7 cm and appears normal.  Abdominal aorta:  No aneurysm identified.  IMPRESSION: Despite reported negative sonographic Murphy's  sign, sludge and stones in the gallbladder with wall thickening and pericholecystic fluid are compatible with acute cholecystitis.  Original Report Authenticated By: Bernadene Bell. Maricela Curet, M.D.   Ct Abdomen Pelvis W Contrast  06/06/2011  *RADIOLOGY REPORT*  Clinical Data: Abdominal pain, emesis.  CT ABDOMEN AND PELVIS WITH CONTRAST  Technique:  Multidetector CT imaging of the abdomen and pelvis was performed following the standard protocol during bolus administration of intravenous contrast.  Contrast: OMNIPAQUE IOHEXOL 300 MG/ML IV SOLN  Comparison: 03/16/2005  Findings: Calcified nodule left lower lobe.  Heart size within normal limits.  Left hilar calcified lymph node.  Low attenuation of the liver is in keeping with fatty infiltration. There are several tiny gallstones.  No gallbladder wall thickening or pericholecystic fluid.  Unremarkable spleen, pancreas, adrenal glands.  Lobular renal contours.  Punctate calcification within the right kidney periphery is similar to prior.  No hydronephrosis or hydroureter.  Nonobstructing lower pole left renal stone.  Too small further characterize hypodensity of the lower pole of the left kidney.  No bowel obstruction.  Colonic diverticulosis without CT evidence for diverticulitis.  Appendix within normal limits.  No lymphadenopathy.  Thin-walled bladder.  Bilateral fat containing inguinal hernias.  There is scattered atherosclerotic calcification of the aorta and its branches. No aneurysmal dilatation.  No acute osseous abnormality.  IMPRESSION: No acute CT abnormality identified.  Hepatic steatosis.  Cholelithiasis.  Colonic diverticulosis.  Nonobstructing lower pole left renal stone.  Original Report Authenticated By: Waneta Martins, M.D.     1. Abdominal pain   2. HIV INFECTION   3. LIVER FUNCTION TESTS, ABNORMAL, HX OF       MDM  Recheck at 0900:  No acute abdomen. Feeling much better. Discussed ultrasound findings with general surgeon. We feel  patient can be discharged home.  Medications for pain and nausea. Will return if worse.        Donnetta Hutching, MD 06/06/11 [phone]

## 2011-06-06 NOTE — ED Notes (Signed)
Patient complaining of abdominal pain x 2 days, vomiting started tonight.

## 2011-06-06 NOTE — ED Provider Notes (Signed)
History     CSN: [id number] Arrival date & time: 06/06/2011 12:52 AM   First MD Initiated Contact with Patient 06/06/11 0128      Chief Complaint  Patient presents with  . Abdominal Pain  . Emesis    (Consider location/radiation/quality/duration/timing/severity/associated sxs/prior treatment) Patient is a 65 y.o. male presenting with abdominal pain and vomiting. The history is provided by the patient.  Abdominal Pain The primary symptoms of the illness include abdominal pain, nausea and vomiting. The primary symptoms of the illness do not include fever, fatigue, diarrhea or dysuria. The current episode started yesterday. The onset of the illness was gradual. The problem has been gradually worsening.  Pain Location: upper and lower abdomen. The abdominal pain does not radiate. The severity of the abdominal pain is 10/10. Exacerbated by: does not get worse with eating or drinking.  Vomiting occurs 2 to 5 times per day. The emesis contains stomach contents.  Emesis  Associated symptoms include abdominal pain. Pertinent negatives include no diarrhea and no fever.    Past Medical History  Diagnosis Date  . Multiple sclerosis   . Hypertension   . HIV (human immunodeficiency virus infection)     History reviewed. No pertinent past surgical history.  History reviewed. No pertinent family history.  History  Substance Use Topics  . Smoking status: Never Smoker   . Smokeless tobacco: Not on file  . Alcohol Use: No      Review of Systems  Constitutional: Negative for fever and fatigue.  Gastrointestinal: Positive for nausea, vomiting and abdominal pain. Negative for diarrhea.  Genitourinary: Negative for dysuria.  All other systems reviewed and are negative.    Allergies  Review of patient's allergies indicates no known allergies.  Home Medications   Current Outpatient Rx  Name Route Sig Dispense Refill  . ALLOPURINOL 300 MG PO TABS Oral Take 300 mg by mouth daily.       Marland Kitchen AMITRIPTYLINE HCL 50 MG PO TABS Oral Take 50 mg by mouth at bedtime.      . ASPIRIN 81 MG PO TABS Oral Take 81 mg by mouth daily.      Marland Kitchen CARBAMAZEPINE ER 100 MG PO CP12 Oral Take 100 mg by mouth 2 (two) times daily.      . EFAVIRENZ-EMTRICITAB-TENOFOVIR 600-200-300 MG PO TABS Oral Take 1 tablet by mouth at bedtime.      . OMEGA-3 FATTY ACIDS 1000 MG PO CAPS Oral Take 2 g by mouth daily.      Marland Kitchen FOLIC ACID 400 MCG PO TABS Oral Take 400 mcg by mouth daily.      Marland Kitchen HYDROCHLOROTHIAZIDE 25 MG PO TABS Oral Take 25 mg by mouth daily.      . INTERFERON BETA-1B 0.3 MG [place] SOLR Subcutaneous Inject 44 mg into the skin every Monday, Wednesday, and Friday.      Marland Kitchen LISINOPRIL 10 MG PO TABS Oral Take 10 mg by mouth daily.      . CENTRUM PO CHEW Oral Chew 1 tablet by mouth daily.      Marland Kitchen OMEPRAZOLE 20 MG PO CPDR Oral Take 20 mg by mouth daily.      Marland Kitchen PRAVASTATIN SODIUM 20 MG PO TABS Oral Take 20 mg by mouth daily.      Marland Kitchen PYRIDOXINE HCL 100 MG PO TABS Oral Take 100 mg by mouth daily.        BP 157/79  Pulse 86  Temp(Src) 98.6 F (37 C) (Oral)  Resp 14  Ht  5\' 8"  (1.727 m)  Wt 210 lb (95.255 kg)  BMI 31.93 kg/m2  SpO2 100%  Physical Exam  Nursing note and vitals reviewed. Constitutional: He appears well-developed and well-nourished. No distress.  HENT:  Head: Normocephalic and atraumatic.  Right Ear: External ear normal.  Left Ear: External ear normal.  Eyes: Conjunctivae are normal. Right eye exhibits no discharge. Left eye exhibits no discharge. No scleral icterus.  Neck: Neck supple. No tracheal deviation present.  Cardiovascular: Normal rate, regular rhythm and intact distal pulses.   Pulmonary/Chest: Effort normal and breath sounds normal. No stridor. No respiratory distress. He has no wheezes. He has no rales.  Abdominal: Soft. Bowel sounds are normal. He exhibits no distension, no ascites, no pulsatile midline mass and no mass. There is generalized tenderness. There is no rebound and no  guarding. No hernia.       Diffusely tender  Musculoskeletal: He exhibits no edema and no tenderness.  Neurological: He is alert. He has normal strength. No sensory deficit. Cranial nerve deficit:  no gross defecits noted. He exhibits normal muscle tone. He displays no seizure activity. Coordination normal.  Skin: Skin is warm and dry. No rash noted.  Psychiatric: He has a normal mood and affect.    ED Course  Procedures (including critical care time)  Labs Reviewed  CBC - Abnormal; Notable for the following:    Hemoglobin 12.9 (*)    All other components within normal limits  COMPREHENSIVE METABOLIC PANEL - Abnormal; Notable for the following:    Glucose, Bld 157 (*)    AST 72 (*)    ALT 64 (*)    Total Bilirubin 0.2 (*)    GFR calc non Af Amer 76 (*)    GFR calc Af Amer 88 (*)    All other components within normal limits  URINALYSIS, ROUTINE W REFLEX MICROSCOPIC - Abnormal; Notable for the following:    Color, Urine AMBER (*) BIOCHEMICALS MAY BE AFFECTED BY COLOR   All other components within normal limits  DIFFERENTIAL  LIPASE, BLOOD   Ct Abdomen Pelvis W Contrast  06/06/2011  *RADIOLOGY REPORT*  Clinical Data: Abdominal pain, emesis.  CT ABDOMEN AND PELVIS WITH CONTRAST  Technique:  Multidetector CT imaging of the abdomen and pelvis was performed following the standard protocol during bolus administration of intravenous contrast.  Contrast: OMNIPAQUE IOHEXOL 300 MG/ML IV SOLN  Comparison: 03/16/2005  Findings: Calcified nodule left lower lobe.  Heart size within normal limits.  Left hilar calcified lymph node.  Low attenuation of the liver is in keeping with fatty infiltration. There are several tiny gallstones.  No gallbladder wall thickening or pericholecystic fluid.  Unremarkable spleen, pancreas, adrenal glands.  Lobular renal contours.  Punctate calcification within the right kidney periphery is similar to prior.  No hydronephrosis or hydroureter.  Nonobstructing lower  pole left renal stone.  Too small further characterize hypodensity of the lower pole of the left kidney.  No bowel obstruction.  Colonic diverticulosis without CT evidence for diverticulitis.  Appendix within normal limits.  No lymphadenopathy.  Thin-walled bladder.  Bilateral fat containing inguinal hernias.  There is scattered atherosclerotic calcification of the aorta and its branches. No aneurysmal dilatation.  No acute osseous abnormality.  IMPRESSION: No acute CT abnormality identified.  Hepatic steatosis.  Cholelithiasis.  Colonic diverticulosis.  Nonobstructing lower pole left renal stone.  Original Report Authenticated By: Waneta Martins, M.D.       MDM  Pt with slight increase in LFTs and  cholelithiasis noted on ct scan.  Pain is diffuse and not located solely in RUQ.  Will hold pt in the ED and obtain and [place] this am for further evaluation. 7:07 AM Pt still having pain.  Pending [place].    Will turn over case to Dr Adriana Simas.       Celene Kras, MD 06/06/11 [phone]

## 2011-06-08 ENCOUNTER — Inpatient Hospital Stay (HOSPITAL_COMMUNITY)
Admission: EM | Admit: 2011-06-08 | Discharge: 2011-06-12 | DRG: 417 | Disposition: A | Discharge status: Home / Self Care | Payer: Medicare Other | Attending: General Surgery | Admitting: General Surgery

## 2011-06-08 ENCOUNTER — Encounter (HOSPITAL_COMMUNITY): Payer: Self-pay | Admitting: Emergency Medicine

## 2011-06-08 ENCOUNTER — Other Ambulatory Visit: Payer: Self-pay

## 2011-06-08 DIAGNOSIS — I10 Essential (primary) hypertension: Secondary | ICD-10-CM | POA: Diagnosis present

## 2011-06-08 DIAGNOSIS — R6510 Systemic inflammatory response syndrome (SIRS) of non-infectious origin without acute organ dysfunction: Secondary | ICD-10-CM

## 2011-06-08 DIAGNOSIS — G35 Multiple sclerosis: Secondary | ICD-10-CM | POA: Diagnosis present

## 2011-06-08 DIAGNOSIS — E876 Hypokalemia: Secondary | ICD-10-CM | POA: Diagnosis present

## 2011-06-08 DIAGNOSIS — K810 Acute cholecystitis: Secondary | ICD-10-CM

## 2011-06-08 DIAGNOSIS — E860 Dehydration: Secondary | ICD-10-CM | POA: Diagnosis present

## 2011-06-08 DIAGNOSIS — K8000 Calculus of gallbladder with acute cholecystitis without obstruction: Principal | ICD-10-CM | POA: Diagnosis present

## 2011-06-08 DIAGNOSIS — B20 Human immunodeficiency virus [HIV] disease: Secondary | ICD-10-CM | POA: Diagnosis present

## 2011-06-08 HISTORY — DX: Acute myocardial infarction, unspecified: I21.9

## 2011-06-08 LAB — URINE MICROSCOPIC-ADD ON

## 2011-06-08 LAB — URINALYSIS, ROUTINE W REFLEX MICROSCOPIC
Glucose, UA: NEGATIVE mg/dL
Ketones, ur: NEGATIVE mg/dL
Leukocytes, UA: NEGATIVE
Nitrite: NEGATIVE
Protein, ur: 100 mg/dL — AB
Specific Gravity, Urine: >1.03 — ABNORMAL HIGH (ref 1.005–1.030)
Urobilinogen, UA: 0.2 mg/dL (ref 0.0–1.0)
pH: 5.5 (ref 5.0–8.0)

## 2011-06-08 LAB — LACTIC ACID, PLASMA: Lactic Acid, Venous: 1.4 mmol/L (ref 0.5–2.2)

## 2011-06-08 LAB — COMPREHENSIVE METABOLIC PANEL
ALT: 41 U/L (ref 0–53)
AST: 55 U/L — ABNORMAL HIGH (ref 0–37)
Albumin: 3.3 g/dL — ABNORMAL LOW (ref 3.5–5.2)
Alkaline Phosphatase: 81 U/L (ref 39–117)
BUN: 40 mg/dL — ABNORMAL HIGH (ref 6–23)
CO2: 21 mEq/L (ref 19–32)
Calcium: 9.2 mg/dL (ref 8.4–10.5)
Chloride: 100 mEq/L (ref 96–112)
Creatinine, Ser: 1.89 mg/dL — ABNORMAL HIGH (ref 0.50–1.35)
GFR calc Af Amer: 41 mL/min — ABNORMAL LOW (ref >90)
GFR calc non Af Amer: 35 mL/min — ABNORMAL LOW (ref >90)
Glucose, Bld: 159 mg/dL — ABNORMAL HIGH (ref 70–99)
Potassium: 3.5 mEq/L (ref 3.5–5.1)
Sodium: 136 mEq/L (ref 135–145)
Total Bilirubin: 0.7 mg/dL (ref 0.3–1.2)
Total Protein: 8.3 g/dL (ref 6.0–8.3)

## 2011-06-08 LAB — CBC
HCT: 39.7 % (ref 39.0–52.0)
Hemoglobin: 12.8 g/dL — ABNORMAL LOW (ref 13.0–17.0)
MCH: 29.6 pg (ref 26.0–34.0)
MCHC: 32.2 g/dL (ref 30.0–36.0)
MCV: 91.9 fL (ref 78.0–100.0)
Platelets: 194 10*3/uL (ref 150–400)
RBC: 4.32 MIL/uL (ref 4.22–5.81)
RDW: 14.9 % (ref 11.5–15.5)
WBC: 17.4 10*3/uL — ABNORMAL HIGH (ref 4.0–10.5)

## 2011-06-08 LAB — CARDIAC PANEL(CRET KIN+CKTOT+MB+TROPI)
CK, MB: 1.7 ng/mL (ref 0.3–4.0)
Relative Index: 0.2 (ref 0.0–2.5)
Total CK: 696 U/L — ABNORMAL HIGH (ref 7–232)
Troponin I: <0.3 ng/mL (ref <0.30)

## 2011-06-08 MED ORDER — CHLORHEXIDINE GLUCONATE 0.12 % MT SOLN
15.0000 mL | Freq: Two times a day (BID) | OROMUCOSAL | Status: DC
  Administered 2011-06-09 – 2011-06-10 (×2): 15 mL via OROMUCOSAL
  Filled 2011-06-08 (×2): qty 15

## 2011-06-08 MED ORDER — LACTATED RINGERS IV SOLN
INTRAVENOUS | Status: DC
  Administered 2011-06-08: 950 mL via INTRAVENOUS
  Administered 2011-06-09: via INTRAVENOUS
  Administered 2011-06-09 – 2011-06-10 (×3): 950 mL via INTRAVENOUS

## 2011-06-08 MED ORDER — ONDANSETRON HCL 4 MG/2ML IJ SOLN: 4.0000 mg | Freq: Three times a day (TID) | INTRAMUSCULAR | Status: AC | PRN

## 2011-06-08 MED ORDER — HYDROMORPHONE HCL PF 1 MG/ML IJ SOLN
1.0000 mg | INTRAMUSCULAR | Status: DC | PRN
  Administered 2011-06-09 – 2011-06-10 (×5): 1 mg via INTRAVENOUS
  Filled 2011-06-08 (×5): qty 1

## 2011-06-08 MED ORDER — INTERFERON BETA-1A 44 MCG/0.5ML ~~LOC~~ SOLN
44.0000 ug | SUBCUTANEOUS | Status: DC
  Administered 2011-06-08 – 2011-06-11 (×2): 44 ug via SUBCUTANEOUS

## 2011-06-08 MED ORDER — SODIUM CHLORIDE 0.9 % IV SOLN: INTRAVENOUS | Status: AC

## 2011-06-08 MED ORDER — BIOTENE DRY MOUTH MT LIQD
15.0000 mL | Freq: Two times a day (BID) | OROMUCOSAL | Status: DC
  Administered 2011-06-08 – 2011-06-09 (×3): 15 mL via OROMUCOSAL

## 2011-06-08 MED ORDER — SODIUM CHLORIDE 0.9 % IV SOLN
1.0000 g | INTRAVENOUS | Status: DC
  Administered 2011-06-08 – 2011-06-11 (×4): 1 g via INTRAVENOUS
  Filled 2011-06-08 (×6): qty 1

## 2011-06-08 MED ORDER — ENOXAPARIN SODIUM 40 MG/0.4ML ~~LOC~~ SOLN
40.0000 mg | SUBCUTANEOUS | Status: DC
  Administered 2011-06-08 – 2011-06-11 (×4): 40 mg via SUBCUTANEOUS
  Filled 2011-06-08 (×4): qty 0.4

## 2011-06-08 MED ORDER — ONDANSETRON HCL 4 MG/2ML IJ SOLN: 4.0000 mg | Freq: Four times a day (QID) | INTRAMUSCULAR | Status: DC | PRN

## 2011-06-08 MED ORDER — HYDROMORPHONE HCL PF 1 MG/ML IJ SOLN
1.0000 mg | INTRAMUSCULAR | Status: AC | PRN
  Administered 2011-06-08: 1 mg via INTRAVENOUS
  Filled 2011-06-08: qty 1

## 2011-06-08 MED ORDER — SODIUM CHLORIDE 0.9 % IV SOLN: 1000.0000 mL | Freq: Once | INTRAVENOUS | Status: DC

## 2011-06-08 MED ORDER — SODIUM CHLORIDE 0.9 % IV BOLUS (SEPSIS)
1000.0000 mL | Freq: Once | INTRAVENOUS | Status: AC
  Administered 2011-06-08: 1000 mL via INTRAVENOUS

## 2011-06-08 MED ORDER — PANTOPRAZOLE SODIUM 40 MG IV SOLR
40.0000 mg | Freq: Every day | INTRAVENOUS | Status: DC
  Administered 2011-06-08 – 2011-06-11 (×4): 40 mg via INTRAVENOUS
  Filled 2011-06-08 (×4): qty 40

## 2011-06-08 MED ORDER — PIPERACILLIN-TAZOBACTAM 3.375 G IVPB
3.3750 g | Freq: Once | INTRAVENOUS | Status: AC
  Administered 2011-06-08: 3.375 g via INTRAVENOUS
  Filled 2011-06-08: qty 50

## 2011-06-08 NOTE — ED Provider Notes (Signed)
I saw and evaluated the patient, reviewed the resident's note and I agree with the findings and plan.   .Face to face Exam:  General:  Awake HEENT:  Atraumatic Resp:  Normal effort Abd:  Nondistended Neuro:No focal weakness Lymph: No adenopathy     Date: 06/08/2011  Rate: 123  Rhythm: sinus tachycardia  QRS Axis: left  Intervals: normal  ST/T Wave abnormalities: normal  Conduction Disutrbances:left anterior fascicular block:   Old EKG Reviewed: none available      Nelia Shi, MD 06/08/11 1530

## 2011-06-08 NOTE — ED Notes (Signed)
Pt c/o weakness since 12/19, pt seen here for the same then.

## 2011-06-08 NOTE — ED Provider Notes (Signed)
History     CSN: [id number]  Arrival date & time 06/08/11  0807   First MD Initiated Contact with Patient 06/08/11 0818      Chief Complaint  Patient presents with  . Weakness   HPI Pt is a 65 year old male with HIV and MS who presented to the ED on 12/19 with abdominal pain.  He had imaging that was consistent with acute cholecystitis but he was very resistant to the idea of admission and so went home with meds for nausea and pain control.  Beginning yesterday afternoon his friend noticed that he seemed weaker and not quite himself.  This morning he was not really able to get up and around so his friend insisted that he come in to the ER.  Pt was resistant to this idea as he has an appointment with his PCP this afternoon, but his friend managed to get him to come in.  Pt currently denies abd pain, although he will admit to diffuse abd pain on and off for the last several days, denies SOB, emesis, cough, visual changes, diarrhea, or edema.  Past Medical History  Diagnosis Date  . Multiple sclerosis   . Hypertension   . HIV (human immunodeficiency virus infection)     History reviewed. No pertinent past surgical history.  History reviewed. No pertinent family history.  History  Substance Use Topics  . Smoking status: Never Smoker   . Smokeless tobacco: Not on file  . Alcohol Use: No      Review of Systems  Constitutional: Positive for appetite change and fatigue. Negative for fever, chills and diaphoresis.  HENT: Negative.   Eyes: Negative.   Respiratory: Negative.   Cardiovascular: Negative.   Gastrointestinal: Positive for abdominal pain. Negative for nausea, vomiting, diarrhea, blood in stool and abdominal distention.  Genitourinary: Negative.   Musculoskeletal: Negative.   Skin: Negative.   Neurological: Positive for weakness. Negative for dizziness, seizures, facial asymmetry, speech difficulty and headaches.  Hematological: Negative.     Allergies  Review of  patient's allergies indicates no known allergies.  Home Medications   Current Outpatient Rx  Name Route Sig Dispense Refill  . ASPIRIN EC 81 MG PO TBEC Oral Take 81 mg by mouth daily.      . OMEGA-3 FATTY ACIDS 1000 MG PO CAPS Oral Take 2 g by mouth daily.      Marland Kitchen FOLIC ACID 400 MCG PO TABS Oral Take 400 mcg by mouth daily.      . CENTRUM PO CHEW Oral Chew 1 tablet by mouth daily.      Marland Kitchen ONDANSETRON HCL 4 MG PO TABS Oral Take 2 tablets (8 mg total) by mouth every 6 (six) hours. 12 tablet 0  . OXYCODONE-ACETAMINOPHEN 5-325 MG PO TABS Oral Take 2 tablets by mouth every 4 (four) hours as needed for pain. 20 tablet 0  . PYRIDOXINE HCL 100 MG PO TABS Oral Take 100 mg by mouth daily.      . ALLOPURINOL 300 MG PO TABS Oral Take 300 mg by mouth daily.      Marland Kitchen AMITRIPTYLINE HCL 50 MG PO TABS Oral Take 50 mg by mouth at bedtime.      Marland Kitchen CARBAMAZEPINE ER 100 MG PO CP12 Oral Take 100 mg by mouth 2 (two) times daily.      . EFAVIRENZ-EMTRICITAB-TENOFOVIR 600-200-300 MG PO TABS Oral Take 1 tablet by mouth at bedtime.      Marland Kitchen HYDROCHLOROTHIAZIDE 25 MG PO TABS Oral Take  25 mg by mouth daily.      . INTERFERON BETA-1B 0.3 MG [employer] SOLR Subcutaneous Inject 44 mg into the skin every Monday, Wednesday, and Friday.      Marland Kitchen LISINOPRIL 10 MG PO TABS Oral Take 10 mg by mouth daily.      Marland Kitchen OMEPRAZOLE 20 MG PO CPDR Oral Take 20 mg by mouth daily.      Marland Kitchen PRAVASTATIN SODIUM 20 MG PO TABS Oral Take 20 mg by mouth daily.        BP 115/39  Pulse 111  Temp(Src) 97.3 F (36.3 C) (Oral)  Resp 39  Ht 5\' 8"  (1.727 m)  Wt 200 lb (90.719 kg)  BMI 30.41 kg/m2  SpO2 94%  Physical Exam  Constitutional: He appears well-developed and well-nourished. No distress.  HENT:  Head: Normocephalic and atraumatic.  Right Ear: External ear normal.  Left Ear: External ear normal.  Nose: Nose normal.  Mouth/Throat: Oropharynx is clear and moist.  Eyes: Conjunctivae and EOM are normal.  Neck: Normal range of motion. Neck supple.   Cardiovascular: Regular rhythm and normal heart sounds.        Mildly tachycardic  Pulmonary/Chest: Effort normal.       Bilateral basal crackles, no other focal findings  Abdominal: Soft. Bowel sounds are normal. He exhibits no distension. There is no tenderness. There is no rebound and no guarding.  Musculoskeletal: Normal range of motion. He exhibits no edema.  Neurological:       Pt is sleepy but arousible, very weak throughout but no focal neuro findingins  Skin: Skin is warm and dry.    ED Course  Procedures (including critical care time)  Labs Reviewed  CBC - Abnormal; Notable for the following:    WBC 17.4 (*)    Hemoglobin 12.8 (*)    All other components within normal limits  COMPREHENSIVE METABOLIC PANEL - Abnormal; Notable for the following:    Glucose, Bld 159 (*)    BUN 40 (*)    Creatinine, Ser 1.89 (*)    Albumin 3.3 (*)    AST 55 (*)    GFR calc non Af Amer 35 (*)    GFR calc Af Amer 41 (*)    All other components within normal limits  URINALYSIS, ROUTINE W REFLEX MICROSCOPIC - Abnormal; Notable for the following:    Specific Gravity, Urine >1.030 (*)    Hgb urine dipstick LARGE (*)    Bilirubin Urine SMALL (*)    Protein, ur 100 (*)    All other components within normal limits  CARDIAC PANEL(CRET KIN+CKTOT+MB+TROPI) - Abnormal; Notable for the following:    Total CK 696 (*)    All other components within normal limits  URINE MICROSCOPIC-ADD ON - Abnormal; Notable for the following:    Casts HYALINE CASTS (*)    All other components within normal limits  LACTIC ACID, PLASMA  CULTURE, BLOOD (ROUTINE X 2)  CULTURE, BLOOD (ROUTINE X 2)    Date: 06/08/2011  Rate: 123  Rhythm: sinus tachycardia  QRS Axis: normal  Intervals: normal  ST/T Wave abnormalities: normal  Conduction Disutrbances:none  Narrative Interpretation: Sinus tach with no acute ST segment changes  Old EKG Reviewed: none available   No results found.   No diagnosis  found.    MDM  Pt appears to have acute cholecystitis and is worsening.  Have spoken with Dr. Dian Situ who will be admitting the patient.  I have also spoken with the patient's PCP who  would be happy to answer any questions that may arise.  If the patient requires additional medical intervention, he requests that the hospitalist service be consulted.        Majel Homer, MD 06/08/11 1150

## 2011-06-09 LAB — CBC
HCT: 32.3 % — ABNORMAL LOW (ref 39.0–52.0)
Hemoglobin: 10.7 g/dL — ABNORMAL LOW (ref 13.0–17.0)
MCH: 30.7 pg (ref 26.0–34.0)
MCHC: 33.1 g/dL (ref 30.0–36.0)
MCV: 92.8 fL (ref 78.0–100.0)
Platelets: 167 10*3/uL (ref 150–400)
RBC: 3.48 MIL/uL — ABNORMAL LOW (ref 4.22–5.81)
RDW: 15.2 % (ref 11.5–15.5)
WBC: 12.7 10*3/uL — ABNORMAL HIGH (ref 4.0–10.5)

## 2011-06-09 LAB — BASIC METABOLIC PANEL
BUN: 31 mg/dL — ABNORMAL HIGH (ref 6–23)
CO2: 25 mEq/L (ref 19–32)
Calcium: 8.9 mg/dL (ref 8.4–10.5)
Chloride: 110 mEq/L (ref 96–112)
Creatinine, Ser: 1.33 mg/dL (ref 0.50–1.35)
GFR calc Af Amer: 62 mL/min — ABNORMAL LOW (ref >90)
GFR calc non Af Amer: 54 mL/min — ABNORMAL LOW (ref >90)
Glucose, Bld: 121 mg/dL — ABNORMAL HIGH (ref 70–99)
Potassium: 3.4 mEq/L — ABNORMAL LOW (ref 3.5–5.1)
Sodium: 143 mEq/L (ref 135–145)

## 2011-06-09 LAB — SURGICAL PCR SCREEN
MRSA, PCR: NEGATIVE
Staphylococcus aureus: POSITIVE — AB

## 2011-06-09 MED ORDER — CHLORHEXIDINE GLUCONATE 4 % EX LIQD
1.0000 "application " | Freq: Once | CUTANEOUS | Status: DC
  Filled 2011-06-09: qty 15

## 2011-06-09 MED ORDER — POTASSIUM CHLORIDE 10 MEQ/100ML IV SOLN
10.0000 meq | INTRAVENOUS | Status: AC
  Administered 2011-06-09 (×4): 10 meq via INTRAVENOUS
  Filled 2011-06-09 (×4): qty 100

## 2011-06-09 MED ORDER — ACETAMINOPHEN 325 MG PO TABS
650.0000 mg | ORAL_TABLET | Freq: Once | ORAL | Status: AC
  Administered 2011-06-09: 650 mg via ORAL
  Filled 2011-06-09: qty 2

## 2011-06-09 NOTE — H&P (Signed)
Kerry Solis is an 65 y.o. male.   Chief Complaint: RUQ pain  HPI: Patient has had some RUQ pain over the last several weeks.  No similar symptoms in the past.  Was seen in the ED earlier in the week and was noted to have acute cholecystitis the patient requested outpatient management. His symptomatology continued to worsen and based on family encouragement return to the emergency department. He has had some subjective fevers and chills other better now. Some associated nausea but no emesis. No change in bowel movements. No melena no hematochezia. No acholic stools. No history of jaundice. Pain is described as colicky in the right upper quadrant. Has improved since bowel rest. No significant family history of biliary disease. No significant change noted with fatty greasy foods.  Past Medical History  Diagnosis Date  . Multiple sclerosis   . Hypertension   . HIV (human immunodeficiency virus infection)   . Myocardial infarction     History reviewed. No pertinent past surgical history.  History reviewed. No pertinent family history. Social History:  reports that he has never smoked. He does not have any smokeless tobacco history on file. He reports that he does not drink alcohol or use illicit drugs.  Allergies: No Known Allergies  Medications Prior to Admission  Medication Dose Route Frequency Provider Last Rate Last Dose  . 0.9 %  sodium chloride infusion   Intravenous STAT Majel Homer, MD      . 0.9 %  sodium chloride infusion  1,000 mL Intravenous Once Majel Homer, MD      . antiseptic oral rinse (BIOTENE) solution 15 mL  15 mL Mouth Rinse q12n4p Fabio Bering, MD   15 mL at 06/08/11 1720  . chlorhexidine (PERIDEX) 0.12 % solution 15 mL  15 mL Mouth Rinse BID Fabio Bering, MD   15 mL at 06/09/11 0854  . enoxaparin (LOVENOX) injection 40 mg  40 mg Subcutaneous Q24H Fabio Bering, MD   40 mg at 06/08/11 1719  . ertapenem (INVANZ) 1 g in sodium chloride 0.9 % 50 mL IVPB  1 g  Intravenous Q24H Fabio Bering, MD   1 g at 06/08/11 1720  . HYDROmorphone (DILAUDID) injection 1 mg  1 mg Intravenous Q4H PRN Majel Homer, MD   1 mg at 06/08/11 2158  . HYDROmorphone (DILAUDID) injection 1-2 mg  1-2 mg Intravenous Q4H PRN Fabio Bering, MD   1 mg at 06/09/11 1049  . interferon beta-1a (REBIF) injection 44 mcg  44 mcg Subcutaneous 3 times weekly Fabio Bering, MD   44 mcg at 06/08/11 1643  . lactated ringers infusion   Intravenous Continuous Fabio Bering, MD 125 mL/hr at 06/09/11 0906 950 mL at 06/09/11 0906  . ondansetron (ZOFRAN) injection 4 mg  4 mg Intravenous Q8H PRN Majel Homer, MD      . ondansetron [facility]) injection 4 mg  4 mg Intravenous Q6H PRN Fabio Bering, MD      . pantoprazole (PROTONIX) injection 40 mg  40 mg Intravenous QHS Fabio Bering, MD   40 mg at 06/08/11 2251  . piperacillin-tazobactam (ZOSYN) IVPB 3.375 g  3.375 g Intravenous Once Majel Homer, MD 12.5 mL/hr at 06/08/11 1109 3.375 g at 06/08/11 1109  . potassium chloride 10 mEq in 100 mL IVPB  10 mEq Intravenous Q1 Hr x 4 Fabio Bering, MD   10 mEq at 06/09/11 1050  . sodium chloride 0.9 % bolus 1,000 mL  1,000 mL Intravenous Once Nelia Shi, MD   1,000 mL at 06/08/11 0858  . sodium chloride 0.9 % bolus 1,000 mL  1,000 mL Intravenous Once Majel Homer, MD   1,000 mL at 06/08/11 1030   Medications Prior to Admission  Medication Sig Dispense Refill  . allopurinol (ZYLOPRIM) 300 MG tablet Take 300 mg by mouth daily.        Marland Kitchen amitriptyline (ELAVIL) 50 MG tablet Take 50 mg by mouth at bedtime.        Marland Kitchen efavirenz-emtrictabine-tenofovir (ATRIPLA) 600-200-300 MG per tablet Take 1 tablet by mouth at bedtime.        . fish oil-omega-3 fatty acids 1000 MG capsule Take 2 g by mouth daily.        . folic acid (FOLVITE) 400 MCG tablet Take 400 mcg by mouth daily.        . hydrochlorothiazide (HYDRODIURIL) 25 MG tablet Take 25 mg by mouth daily.        . interferon beta-1b (BETASERON) 0.3 MG injection  Inject 44 mg into the skin every Monday, Wednesday, and Friday.        Marland Kitchen lisinopril (PRINIVIL,ZESTRIL) 10 MG tablet Take 10 mg by mouth daily.        . multivitamin-iron-minerals-folic acid (CENTRUM) chewable tablet Chew 1 tablet by mouth daily.        Marland Kitchen omeprazole (PRILOSEC) 20 MG capsule Take 20 mg by mouth daily.        . ondansetron (ZOFRAN) 4 MG tablet Take 2 tablets (8 mg total) by mouth every 6 (six) hours.  12 tablet  0  . oxyCODONE-acetaminophen (PERCOCET) 5-325 MG per tablet Take 2 tablets by mouth every 4 (four) hours as needed for pain.  20 tablet  0  . pravastatin (PRAVACHOL) 20 MG tablet Take 20 mg by mouth daily.        Marland Kitchen pyridoxine (B-6) 100 MG tablet Take 100 mg by mouth daily.          Results for orders placed during the hospital encounter of 06/08/11 (from the past 48 hour(s))  CBC     Status: Abnormal   Collection Time   06/08/11  8:38 AM      Component Value Range Comment   WBC 17.4 (*) 4.0 - 10.5 (K/uL)    RBC 4.32  4.22 - 5.81 (MIL/uL)    Hemoglobin 12.8 (*) 13.0 - 17.0 (g/dL)    HCT 16.1  09.6 - 04.5 (%)    MCV 91.9  78.0 - 100.0 (fL)    MCH 29.6  26.0 - 34.0 (pg)    MCHC 32.2  30.0 - 36.0 (g/dL)    RDW 40.9  81.1 - 91.4 (%)    Platelets 194  150 - 400 (K/uL)   COMPREHENSIVE METABOLIC PANEL     Status: Abnormal   Collection Time   06/08/11  8:38 AM      Component Value Range Comment   Sodium 136  135 - 145 (mEq/L)    Potassium 3.5  3.5 - 5.1 (mEq/L)    Chloride 100  96 - 112 (mEq/L)    CO2 21  19 - 32 (mEq/L)    Glucose, Bld 159 (*) 70 - 99 (mg/dL)    BUN 40 (*) 6 - 23 (mg/dL)    Creatinine, Ser 7.82 (*) 0.50 - 1.35 (mg/dL)    Calcium 9.2  8.4 - 10.5 (mg/dL)    Total Protein 8.3  6.0 - 8.3 (g/dL)  Albumin 3.3 (*) 3.5 - 5.2 (g/dL)    AST 55 (*) 0 - 37 (U/L)    ALT 41  0 - 53 (U/L)    Alkaline Phosphatase 81  39 - 117 (U/L)    Total Bilirubin 0.7  0.3 - 1.2 (mg/dL)    GFR calc non Af Amer 35 (*) >90 (mL/min)    GFR calc Af Amer 41 (*) >90 (mL/min)     CARDIAC PANEL(CRET KIN+CKTOT+MB+TROPI)     Status: Abnormal   Collection Time   06/08/11  8:38 AM      Component Value Range Comment   Total CK 696 (*) 7 - 232 (U/L)    CK, MB 1.7  0.3 - 4.0 (ng/mL)    Troponin I <0.30  <0.30 (ng/mL)    Relative Index 0.2  0.0 - 2.5    CULTURE, BLOOD (ROUTINE X 2)     Status: Normal (Preliminary result)   Collection Time   06/08/11  9:04 AM      Component Value Range Comment   Specimen Description Blood LEFT ARM      Special Requests BOTTLES DRAWN AEROBIC AND ANAEROBIC 5 CC EACH      Culture NO GROWTH 1 DAY      Report Status PENDING     LACTIC ACID, PLASMA     Status: Normal   Collection Time   06/08/11  9:11 AM      Component Value Range Comment   Lactic Acid, Venous 1.4  0.5 - 2.2 (mmol/L)   CULTURE, BLOOD (ROUTINE X 2)     Status: Normal (Preliminary result)   Collection Time   06/08/11  9:16 AM      Component Value Range Comment   Specimen Description Blood RIGHT WRIST      Special Requests BOTTLES DRAWN AEROBIC AND ANAEROBIC 5 CC EACH      Culture NO GROWTH 1 DAY      Report Status PENDING     URINALYSIS, ROUTINE W REFLEX MICROSCOPIC     Status: Abnormal   Collection Time   06/08/11  9:53 AM      Component Value Range Comment   Color, Urine YELLOW  YELLOW     APPearance CLEAR  CLEAR     Specific Gravity, Urine >1.030 (*) 1.005 - 1.030     pH 5.5  5.0 - 8.0     Glucose, UA NEGATIVE  NEGATIVE (mg/dL)    Hgb urine dipstick LARGE (*) NEGATIVE     Bilirubin Urine SMALL (*) NEGATIVE     Ketones, ur NEGATIVE  NEGATIVE (mg/dL)    Protein, ur 213 (*) NEGATIVE (mg/dL)    Urobilinogen, UA 0.2  0.0 - 1.0 (mg/dL)    Nitrite NEGATIVE  NEGATIVE     Leukocytes, UA NEGATIVE  NEGATIVE    URINE MICROSCOPIC-ADD ON     Status: Abnormal   Collection Time   06/08/11  9:53 AM      Component Value Range Comment   Squamous Epithelial / LPF RARE  RARE     WBC, UA 0-2  <3 (WBC/hpf)    RBC / HPF 21-50  <3 (RBC/hpf)    Bacteria, UA RARE  RARE     Casts  HYALINE CASTS (*) NEGATIVE    BASIC METABOLIC PANEL     Status: Abnormal   Collection Time   06/09/11  6:35 AM      Component Value Range Comment   Sodium 143  135 - 145 (mEq/L) DELTA  CHECK NOTED   Potassium 3.4 (*) 3.5 - 5.1 (mEq/L)    Chloride 110  96 - 112 (mEq/L) DELTA CHECK NOTED   CO2 25  19 - 32 (mEq/L)    Glucose, Bld 121 (*) 70 - 99 (mg/dL)    BUN 31 (*) 6 - 23 (mg/dL)    Creatinine, Ser 1.61  0.50 - 1.35 (mg/dL)    Calcium 8.9  8.4 - 10.5 (mg/dL)    GFR calc non Af Amer 54 (*) >90 (mL/min)    GFR calc Af Amer 62 (*) >90 (mL/min)   CBC     Status: Abnormal   Collection Time   06/09/11  6:35 AM      Component Value Range Comment   WBC 12.7 (*) 4.0 - 10.5 (K/uL)    RBC 3.48 (*) 4.22 - 5.81 (MIL/uL)    Hemoglobin 10.7 (*) 13.0 - 17.0 (g/dL)    HCT 09.6 (*) 04.5 - 52.0 (%)    MCV 92.8  78.0 - 100.0 (fL)    MCH 30.7  26.0 - 34.0 (pg)    MCHC 33.1  30.0 - 36.0 (g/dL)    RDW 40.9  81.1 - 91.4 (%)    Platelets 167  150 - 400 (K/uL)    No results found.  Review of Systems  Constitutional: Negative for fever, chills and weight loss.  Eyes: Negative.  Negative for blurred vision and double vision.  Respiratory: Negative.   Cardiovascular: Negative.   Gastrointestinal: Positive for heartburn, nausea and abdominal pain (Better now but with 1-2 wk history of RUQ pain). Negative for vomiting, diarrhea, constipation, blood in stool and melena.  Genitourinary: Negative.   Musculoskeletal: Negative.   Skin: Negative.   Neurological: Positive for headaches.  Endo/Heme/Allergies: Negative.   Psychiatric/Behavioral: Negative.     Blood pressure 120/70, pulse 109, temperature 100.9 F (38.3 C), temperature source Oral, resp. rate 28, height 5\' 9"  (1.753 m), weight 90.71 kg (199 lb 15.7 oz), SpO2 92.00%. Physical Exam  Constitutional: He is oriented to person, place, and time. He appears well-developed and well-nourished. No distress.       obese  HENT:  Head: Normocephalic and  atraumatic.  Eyes: Conjunctivae are normal. Pupils are equal, round, and reactive to light. No scleral icterus.  Neck: Normal range of motion. Neck supple. No tracheal deviation present. No thyromegaly present.  Cardiovascular: Normal rate, regular rhythm and normal heart sounds.   Respiratory: Effort normal and breath sounds normal. No respiratory distress. He has no wheezes.  GI: Soft. Bowel sounds are normal. He exhibits no distension and no mass. There is tenderness (mild RUQ tenderness.  No Murphy's sign..No peritoneal signs.). There is no rebound and no guarding.  Musculoskeletal: Normal range of motion.  Lymphadenopathy:    He has no cervical adenopathy.  Neurological: He is alert and oriented to person, place, and time.  Skin: Skin is warm and dry.     Assessment/Plan Acute cholecystitis. At this point did discuss with the patient his previous ultrasound and CT evaluations. Patient initially presented with dehydration but this has improved fluid resuscitation. He remains hypokalemic and this is currently being replaced. At this point we'll continue bowel rest. Surgical options were discussed with the patient including but not limited to risk of bleeding infection bile leak common bile duct injury as well as intraoperative cardiac and pulmonary events. At this point upon further resuscitation will plan to proceed to the operating room tomorrow. We'll continue IV antibiotics. At this point we'll  continue clears as tolerated until midnight tonight. Patient's and family 's questions and concerns were addressed at length.  Anissia Wessells C 06/09/2011, 11:47 AM

## 2011-06-10 ENCOUNTER — Other Ambulatory Visit: Payer: Self-pay | Admitting: General Surgery

## 2011-06-10 ENCOUNTER — Encounter (HOSPITAL_COMMUNITY): Payer: Self-pay | Admitting: Anesthesiology

## 2011-06-10 ENCOUNTER — Encounter (HOSPITAL_COMMUNITY)
Admission: EM | Disposition: A | Discharge status: Home / Self Care | Payer: Self-pay | Source: Home / Self Care | Attending: General Surgery

## 2011-06-10 ENCOUNTER — Other Ambulatory Visit: Payer: Self-pay

## 2011-06-10 ENCOUNTER — Inpatient Hospital Stay (HOSPITAL_COMMUNITY): Payer: Medicare Other | Admitting: Anesthesiology

## 2011-06-10 HISTORY — PX: CHOLECYSTECTOMY: SHX55

## 2011-06-10 LAB — BASIC METABOLIC PANEL
BUN: 21 mg/dL (ref 6–23)
CO2: 23 mEq/L (ref 19–32)
Calcium: 8.6 mg/dL (ref 8.4–10.5)
Chloride: 104 mEq/L (ref 96–112)
Creatinine, Ser: 1.04 mg/dL (ref 0.50–1.35)
GFR calc Af Amer: 84 mL/min — ABNORMAL LOW (ref >90)
GFR calc non Af Amer: 72 mL/min — ABNORMAL LOW (ref >90)
Glucose, Bld: 108 mg/dL — ABNORMAL HIGH (ref 70–99)
Potassium: 3.3 mEq/L — ABNORMAL LOW (ref 3.5–5.1)
Sodium: 137 mEq/L (ref 135–145)

## 2011-06-10 LAB — CBC
HCT: 30.4 % — ABNORMAL LOW (ref 39.0–52.0)
Hemoglobin: 10.1 g/dL — ABNORMAL LOW (ref 13.0–17.0)
MCH: 30.6 pg (ref 26.0–34.0)
MCHC: 33.2 g/dL (ref 30.0–36.0)
MCV: 92.1 fL (ref 78.0–100.0)
Platelets: 172 10*3/uL (ref 150–400)
RBC: 3.3 MIL/uL — ABNORMAL LOW (ref 4.22–5.81)
RDW: 15 % (ref 11.5–15.5)
WBC: 8.7 10*3/uL (ref 4.0–10.5)

## 2011-06-10 LAB — DIFFERENTIAL
Basophils Absolute: 0 10*3/uL (ref 0.0–0.1)
Basophils Relative: 0 % (ref 0–1)
Eosinophils Absolute: 0 10*3/uL (ref 0.0–0.7)
Eosinophils Relative: 0 % (ref 0–5)
Lymphocytes Relative: 9 % — ABNORMAL LOW (ref 12–46)
Lymphs Abs: 0.8 10*3/uL (ref 0.7–4.0)
Monocytes Absolute: 1 10*3/uL (ref 0.1–1.0)
Monocytes Relative: 12 % (ref 3–12)
Neutro Abs: 6.9 10*3/uL (ref 1.7–7.7)
Neutrophils Relative %: 79 % — ABNORMAL HIGH (ref 43–77)

## 2011-06-10 SURGERY — LAPAROSCOPIC CHOLECYSTECTOMY
Anesthesia: General

## 2011-06-10 SURGERY — LAPAROSCOPIC CHOLECYSTECTOMY
Anesthesia: General | Wound class: Contaminated

## 2011-06-10 MED ORDER — BUPIVACAINE HCL (PF) 0.5 % IJ SOLN
INTRAMUSCULAR | Status: AC
  Filled 2011-06-10: qty 30

## 2011-06-10 MED ORDER — GLYCOPYRROLATE 0.2 MG/ML IJ SOLN
INTRAMUSCULAR | Status: AC
  Filled 2011-06-10: qty 1

## 2011-06-10 MED ORDER — CARBAMAZEPINE 100 MG PO CHEW
100.0000 mg | CHEWABLE_TABLET | Freq: Two times a day (BID) | ORAL | Status: DC
  Administered 2011-06-10 – 2011-06-12 (×5): 100 mg via ORAL
  Filled 2011-06-10 (×7): qty 1

## 2011-06-10 MED ORDER — ONDANSETRON HCL 4 MG/2ML IJ SOLN
INTRAMUSCULAR | Status: AC
  Filled 2011-06-10: qty 2

## 2011-06-10 MED ORDER — SUCCINYLCHOLINE CHLORIDE 20 MG/ML IJ SOLN
INTRAMUSCULAR | Status: AC
  Filled 2011-06-10: qty 1

## 2011-06-10 MED ORDER — GLYCOPYRROLATE 0.2 MG/ML IJ SOLN
INTRAMUSCULAR | Status: DC | PRN
  Administered 2011-06-10: .4 mg via INTRAVENOUS

## 2011-06-10 MED ORDER — ONDANSETRON HCL 4 MG/2ML IJ SOLN
INTRAMUSCULAR | Status: DC | PRN
  Administered 2011-06-10: 4 mg via INTRAVENOUS

## 2011-06-10 MED ORDER — HYDROCHLOROTHIAZIDE 25 MG PO TABS
25.0000 mg | ORAL_TABLET | Freq: Every day | ORAL | Status: DC
  Administered 2011-06-10 – 2011-06-12 (×3): 25 mg via ORAL
  Filled 2011-06-10 (×3): qty 1

## 2011-06-10 MED ORDER — HEMOSTATIC AGENTS (NO CHARGE) OPTIME
TOPICAL | Status: DC | PRN
  Administered 2011-06-10: 1 via TOPICAL

## 2011-06-10 MED ORDER — FENTANYL CITRATE 0.05 MG/ML IJ SOLN
INTRAMUSCULAR | Status: AC
  Filled 2011-06-10: qty 2

## 2011-06-10 MED ORDER — ROCURONIUM BROMIDE 100 MG/10ML IV SOLN
INTRAVENOUS | Status: DC | PRN
  Administered 2011-06-10: 25 mg via INTRAVENOUS
  Administered 2011-06-10: 10 mg via INTRAVENOUS
  Administered 2011-06-10: 5 mg via INTRAVENOUS

## 2011-06-10 MED ORDER — BUPIVACAINE HCL (PF) 0.5 % IJ SOLN
INTRAMUSCULAR | Status: DC | PRN
  Administered 2011-06-10: 10 mL

## 2011-06-10 MED ORDER — AMITRIPTYLINE HCL 25 MG PO TABS
50.0000 mg | ORAL_TABLET | Freq: Every day | ORAL | Status: DC
  Administered 2011-06-10 – 2011-06-11 (×2): 50 mg via ORAL
  Filled 2011-06-10 (×2): qty 2

## 2011-06-10 MED ORDER — NEOSTIGMINE METHYLSULFATE 1 MG/ML IJ SOLN
INTRAMUSCULAR | Status: DC | PRN
  Administered 2011-06-10: 3 mg via INTRAVENOUS

## 2011-06-10 MED ORDER — FENTANYL CITRATE 0.05 MG/ML IJ SOLN
INTRAMUSCULAR | Status: DC | PRN
  Administered 2011-06-10: 25 ug via INTRAVENOUS
  Administered 2011-06-10: 50 ug via INTRAVENOUS
  Administered 2011-06-10: 100 ug via INTRAVENOUS
  Administered 2011-06-10: 75 ug via INTRAVENOUS
  Administered 2011-06-10: 50 ug via INTRAVENOUS

## 2011-06-10 MED ORDER — FENTANYL CITRATE 0.05 MG/ML IJ SOLN
INTRAMUSCULAR | Status: AC
  Filled 2011-06-10: qty 5

## 2011-06-10 MED ORDER — SODIUM CHLORIDE 0.9 % IR SOLN
Status: DC | PRN
  Administered 2011-06-10: 1000 mL

## 2011-06-10 MED ORDER — LIDOCAINE HCL 1 % IJ SOLN
INTRAMUSCULAR | Status: DC | PRN
  Administered 2011-06-10: 25 mg via INTRADERMAL

## 2011-06-10 MED ORDER — SIMVASTATIN 10 MG PO TABS
10.0000 mg | ORAL_TABLET | Freq: Every day | ORAL | Status: DC
  Administered 2011-06-10 – 2011-06-11 (×2): 10 mg via ORAL
  Filled 2011-06-10 (×2): qty 1

## 2011-06-10 MED ORDER — CARBAMAZEPINE ER 100 MG PO CP12: 100.0000 mg | ORAL_CAPSULE | Freq: Two times a day (BID) | ORAL | Status: DC

## 2011-06-10 MED ORDER — OXYCODONE-ACETAMINOPHEN 5-325 MG PO TABS
1.0000 | ORAL_TABLET | ORAL | Status: DC | PRN
  Administered 2011-06-10 – 2011-06-11 (×2): 2 via ORAL
  Administered 2011-06-12: 1 via ORAL
  Filled 2011-06-10 (×2): qty 2
  Filled 2011-06-10: qty 1

## 2011-06-10 MED ORDER — CELECOXIB 100 MG PO CAPS
200.0000 mg | ORAL_CAPSULE | Freq: Two times a day (BID) | ORAL | Status: DC
  Administered 2011-06-10: 200 mg via ORAL
  Filled 2011-06-10 (×2): qty 1

## 2011-06-10 MED ORDER — MIDAZOLAM HCL 5 MG/5ML IJ SOLN
INTRAMUSCULAR | Status: DC | PRN
  Administered 2011-06-10: 2 mg via INTRAVENOUS

## 2011-06-10 MED ORDER — LISINOPRIL 10 MG PO TABS
10.0000 mg | ORAL_TABLET | Freq: Every day | ORAL | Status: DC
Start: 2011-06-10 — End: 2011-06-12
  Administered 2011-06-10 – 2011-06-12 (×3): 10 mg via ORAL
  Filled 2011-06-10 (×3): qty 1

## 2011-06-10 MED ORDER — ALLOPURINOL 300 MG PO TABS
300.0000 mg | ORAL_TABLET | Freq: Every day | ORAL | Status: DC
  Administered 2011-06-10 – 2011-06-12 (×3): 300 mg via ORAL
  Filled 2011-06-10 (×3): qty 1

## 2011-06-10 MED ORDER — SUCCINYLCHOLINE CHLORIDE 20 MG/ML IJ SOLN
INTRAMUSCULAR | Status: DC | PRN
  Administered 2011-06-10: 140 mg via INTRAVENOUS

## 2011-06-10 MED ORDER — MIDAZOLAM HCL 2 MG/2ML IJ SOLN
INTRAMUSCULAR | Status: AC
  Filled 2011-06-10: qty 2

## 2011-06-10 MED ORDER — CELECOXIB 100 MG PO CAPS
ORAL_CAPSULE | ORAL | Status: AC
  Filled 2011-06-10: qty 2

## 2011-06-10 MED ORDER — EFAVIRENZ-EMTRICITAB-TENOFOVIR 600-200-300 MG PO TABS
1.0000 | ORAL_TABLET | Freq: Every day | ORAL | Status: DC
  Administered 2011-06-10 – 2011-06-11 (×2): 1 via ORAL
  Filled 2011-06-10 (×3): qty 1

## 2011-06-10 MED ORDER — SODIUM CHLORIDE 0.9 % IR SOLN
Status: DC | PRN
  Administered 2011-06-10: 3000 mL

## 2011-06-10 MED ORDER — PROPOFOL 10 MG/ML IV BOLUS
INTRAVENOUS | Status: DC | PRN
  Administered 2011-06-10: 150 mg via INTRAVENOUS

## 2011-06-10 MED ORDER — HYDROMORPHONE HCL PF 1 MG/ML IJ SOLN
1.0000 mg | INTRAMUSCULAR | Status: DC | PRN
  Administered 2011-06-10: 2 mg via INTRAVENOUS
  Filled 2011-06-10: qty 2

## 2011-06-10 MED ORDER — LACTATED RINGERS IV SOLN
INTRAVENOUS | Status: DC | PRN
  Administered 2011-06-10 (×3): via INTRAVENOUS

## 2011-06-10 SURGICAL SUPPLY — 35 items
APL SKNCLS STERI-STRIP NONHPOA (GAUZE/BANDAGES/DRESSINGS)
APPLIER CLIP UNV 5X34 EPIX (ENDOMECHANICALS) ×1 IMPLANT
APR XCLPCLP 20M/L UNV 34X5 (ENDOMECHANICALS)
BAG HAMPER (MISCELLANEOUS) ×1 IMPLANT
BAG SPEC RTRVL LRG 6X4 10 (ENDOMECHANICALS)
BENZOIN TINCTURE PRP APPL 2/3 (GAUZE/BANDAGES/DRESSINGS) ×1 IMPLANT
CLOTH BEACON ORANGE TIMEOUT ST (SAFETY) ×1 IMPLANT
COVER LIGHT HANDLE STERIS (MISCELLANEOUS) ×2 IMPLANT
DECANTER SPIKE VIAL GLASS SM (MISCELLANEOUS) ×1 IMPLANT
DEVICE TROCAR PUNCTURE CLOSURE (ENDOMECHANICALS) ×1 IMPLANT
DURAPREP 26ML APPLICATOR (WOUND CARE) ×1 IMPLANT
ELECT REM PT RETURN 9FT ADLT (ELECTROSURGICAL)
ELECTRODE REM PT RTRN 9FT ADLT (ELECTROSURGICAL) ×1 IMPLANT
FILTER SMOKE EVAC LAPAROSHD (FILTER) ×1 IMPLANT
FORMALIN 10 PREFIL 120ML (MISCELLANEOUS) ×1 IMPLANT
GLOVE BIOGEL PI IND STRL 7.5 (GLOVE) ×1 IMPLANT
GLOVE BIOGEL PI INDICATOR 7.5 (GLOVE)
GLOVE ECLIPSE 7.0 STRL STRAW (GLOVE) ×1 IMPLANT
GOWN STRL REIN XL XLG (GOWN DISPOSABLE) ×3 IMPLANT
HEMOSTAT SNOW SURGICEL 2X4 (HEMOSTASIS) ×1 IMPLANT
INST SET LAPROSCOPIC AP (KITS) ×1 IMPLANT
IV NS IRRIG 3000ML ARTHROMATIC (IV SOLUTION) ×1 IMPLANT
KIT ROOM TURNOVER APOR (KITS) ×1 IMPLANT
KIT TROCAR LAP CHOLE (TROCAR) ×1 IMPLANT
MANIFOLD NEPTUNE II (INSTRUMENTS) ×1 IMPLANT
PACK LAP CHOLE LZT030E (CUSTOM PROCEDURE TRAY) ×1 IMPLANT
PAD ARMBOARD 7.5X6 YLW CONV (MISCELLANEOUS) ×1 IMPLANT
POUCH SPECIMEN RETRIEVAL 10MM (ENDOMECHANICALS) ×1 IMPLANT
SET BASIN LINEN APH (SET/KITS/TRAYS/PACK) ×1 IMPLANT
SET TUBE IRRIG SUCTION NO TIP (IRRIGATION / IRRIGATOR) IMPLANT
SLEEVE Z-THREAD 5X100MM (TROCAR) ×1 IMPLANT
STRIP CLOSURE SKIN 1/2X4 (GAUZE/BANDAGES/DRESSINGS) ×1 IMPLANT
SUT MNCRL AB 4-0 PS2 18 (SUTURE) ×2 IMPLANT
SUT VIC AB 2-0 CT2 27 (SUTURE) ×2 IMPLANT
WARMER LAPAROSCOPE (MISCELLANEOUS) ×1 IMPLANT

## 2011-06-10 SURGICAL SUPPLY — 45 items
APL SKNCLS STERI-STRIP NONHPOA (GAUZE/BANDAGES/DRESSINGS) ×1
APPLIER CLIP UNV 5X34 EPIX (ENDOMECHANICALS) ×2 IMPLANT
APR XCLPCLP 20M/L UNV 34X5 (ENDOMECHANICALS) ×1
BAG HAMPER (MISCELLANEOUS) ×2 IMPLANT
BAG SPEC RTRVL LRG 6X4 10 (ENDOMECHANICALS) ×1
BENZOIN TINCTURE PRP APPL 2/3 (GAUZE/BANDAGES/DRESSINGS) ×2 IMPLANT
CLOTH BEACON ORANGE TIMEOUT ST (SAFETY) ×2 IMPLANT
COVER LIGHT HANDLE STERIS (MISCELLANEOUS) ×4 IMPLANT
DECANTER SPIKE VIAL GLASS SM (MISCELLANEOUS) ×1 IMPLANT
DEVICE TROCAR PUNCTURE CLOSURE (ENDOMECHANICALS) ×2 IMPLANT
DURAPREP 26ML APPLICATOR (WOUND CARE) ×2 IMPLANT
ELECT REM PT RETURN 9FT ADLT (ELECTROSURGICAL) ×2
ELECTRODE REM PT RTRN 9FT ADLT (ELECTROSURGICAL) ×1 IMPLANT
FILTER SMOKE EVAC LAPAROSHD (FILTER) ×2 IMPLANT
FORMALIN 10 PREFIL 120ML (MISCELLANEOUS) ×2 IMPLANT
GAUZE SPONGE 2X2 8PLY STRL LF (GAUZE/BANDAGES/DRESSINGS) IMPLANT
GLOVE BIOGEL PI IND STRL 7.0 (GLOVE) IMPLANT
GLOVE BIOGEL PI IND STRL 7.5 (GLOVE) ×1 IMPLANT
GLOVE BIOGEL PI INDICATOR 7.0 (GLOVE) ×1
GLOVE BIOGEL PI INDICATOR 7.5 (GLOVE) ×1
GLOVE ECLIPSE 7.0 STRL STRAW (GLOVE) ×2 IMPLANT
GLOVE SS BIOGEL STRL SZ 6.5 (GLOVE) IMPLANT
GLOVE SUPERSENSE BIOGEL SZ 6.5 (GLOVE) ×1
GOWN BRE IMP SLV AUR XL STRL (GOWN DISPOSABLE) ×3 IMPLANT
GOWN STRL REIN XL XLG (GOWN DISPOSABLE) ×3 IMPLANT
HEMOSTAT SNOW SURGICEL 2X4 (HEMOSTASIS) ×2 IMPLANT
INST SET LAPROSCOPIC AP (KITS) ×2 IMPLANT
IV NS IRRIG 3000ML ARTHROMATIC (IV SOLUTION) ×3 IMPLANT
KIT ROOM TURNOVER APOR (KITS) ×2 IMPLANT
KIT TROCAR LAP CHOLE (TROCAR) ×2 IMPLANT
MANIFOLD NEPTUNE II (INSTRUMENTS) ×2 IMPLANT
NS IRRIG 1000ML POUR BTL (IV SOLUTION) ×1 IMPLANT
PACK LAP CHOLE LZT030E (CUSTOM PROCEDURE TRAY) ×2 IMPLANT
PAD ARMBOARD 7.5X6 YLW CONV (MISCELLANEOUS) ×2 IMPLANT
POUCH SPECIMEN RETRIEVAL 10MM (ENDOMECHANICALS) ×2 IMPLANT
SET BASIN LINEN APH (SET/KITS/TRAYS/PACK) ×2 IMPLANT
SET TUBE IRRIG SUCTION NO TIP (IRRIGATION / IRRIGATOR) ×1 IMPLANT
SLEEVE Z-THREAD 5X100MM (TROCAR) ×3 IMPLANT
SPONGE GAUZE 2X2 STER 10/PKG (GAUZE/BANDAGES/DRESSINGS) ×4
STAPLER VISISTAT (STAPLE) ×1 IMPLANT
STRIP CLOSURE SKIN 1/2X4 (GAUZE/BANDAGES/DRESSINGS) ×2 IMPLANT
SUT MNCRL AB 4-0 PS2 18 (SUTURE) ×3 IMPLANT
SUT VIC AB 2-0 CT2 27 (SUTURE) ×3 IMPLANT
TAPE CLOTH SURG 4X10 WHT LF (GAUZE/BANDAGES/DRESSINGS) ×1 IMPLANT
WARMER LAPAROSCOPE (MISCELLANEOUS) ×2 IMPLANT

## 2011-06-10 NOTE — Progress Notes (Signed)
PT SPIKED A TEMP OF 102.6 ORAL AND WRITER CALLED DR. ZIEGLER TO MAKE AWARE AND RECEIVED NEW ORDERS AND FOLLOWED.  PT MADE AWARE OF NEW ORDERS.

## 2011-06-10 NOTE — Transfer of Care (Signed)
Immediate Anesthesia Transfer of Care Note  Patient: Kerry Solis  Procedure(s) Performed:  LAPAROSCOPIC CHOLECYSTECTOMY  Patient Location: PACU  Anesthesia Type: General  Level of Consciousness: awake and patient cooperative  Airway & Oxygen Therapy: Patient Spontanous Breathing and Patient connected to face mask oxygen  Post-op Assessment: Report given to PACU RN, Post -op Vital signs reviewed and stable and Patient moving all extremities  Post vital signs: Reviewed and stable  Complications: No apparent anesthesia complications

## 2011-06-10 NOTE — Anesthesia Postprocedure Evaluation (Signed)
  Anesthesia Post-op Note  Patient: Kerry Solis  Procedure(s) Performed:  LAPAROSCOPIC CHOLECYSTECTOMY  Patient Location: PACU  Anesthesia Type: General  Level of Consciousness: awake, alert , oriented and patient cooperative  Airway and Oxygen Therapy: Patient Spontanous Breathing  Post-op Pain: none  Post-op Assessment: Post-op Vital signs reviewed, Patient's Cardiovascular Status Stable, Respiratory Function Stable, Patent Airway and No signs of Nausea or vomiting  Post-op Vital Signs: Reviewed and stable  Complications: No apparent anesthesia complications

## 2011-06-10 NOTE — Anesthesia Preprocedure Evaluation (Addendum)
Anesthesia Evaluation   Patient awake    Reviewed: Allergy & Precautions, H&P , NPO status , Patient's Chart, lab work & pertinent test results  History of Anesthesia Complications Negative for: history of anesthetic complications  Airway       Dental   Pulmonary shortness of breath and with exertion,          Cardiovascular Exercise Tolerance: Poor hypertension, Pt. on medications + Past MI     Neuro/Psych  Neuromuscular disease  C-spine cleared    GI/Hepatic GERD-  Medicated and Controlled,  Endo/Other    Renal/GU      Musculoskeletal   Abdominal   Peds  Hematology   Anesthesia Other Findings   Reproductive/Obstetrics                           Anesthesia Physical Anesthesia Plan  ASA: III  Anesthesia Plan: General   Post-op Pain Management:    Induction: Intravenous, Rapid sequence and Cricoid pressure planned  Airway Management Planned: Oral ETT  Additional Equipment:   Intra-op Plan:   Post-operative Plan: Extubation in OR  Informed Consent:   Plan Discussed with: Anesthesiologist  Anesthesia Plan Comments: (ROM neck is excellent;Hx of MS presents with minimal leg weakness and Left eye decreased vision.)        Anesthesia Quick Evaluation

## 2011-06-10 NOTE — Op Note (Signed)
Patient:  Kerry Solis  DOB:  11/01/1945  MRN:  [id number]   Preop Diagnosis:  Acute cholecystitis  Postop Diagnosis:  Same  Procedure:  Laparoscopic cholecystectomy  Surgeon:  Dr. Tilford Pillar  Anes:  General endotracheal, 0.5% Sensorcaine plain  Indications:  Patient is a 65 year old male presented to [facility] emergency department right upper quadrant abdominal pain. Workup was consistent for acute cholecystitis. Patient was admitted for continued resuscitation and treatment. He was advised the risks benefits and alternatives of a laparoscopic possible open cholecystectomy. Risks benefits alternatives were discussed at length including but not limited to risk of bleeding, infection, bile leak, small bowel injury, common bile duct injury, intraoperative cardiac and pulmonary events. Patient's questions and concerns were addressed the patient was consented for the planned procedure.  Procedure note:  Patient was taken to the or was placed in supine position on the or table. At this point the general anesthetic is administered and the patient was endotracheally intubated by the nurse anesthetist. At this point his abdomen is prepped with DuraPrep solution and draped in standard fashion. A stab incision was created supraumbilically with 11 blade scalpel. Additional dissection down through subcuticular tissues carried using a Coker clamp was utilized grasp the anterior abdominal wall fascia and lift this anteriorly. A Veress needle is inserted. Saline drop test is utilized firm intraperitoneal placement and then pneumoperitoneum was initiated. Once sufficient pneumoperitoneum was obtained an 11 mm trochars inserted over a laparoscope allowing visualization the trocar entering into the peritoneal cavity. At this point the inner cannula was removed the laparoscope was reinserted there is no evidence of any varus or trocar placement injury. At this point the remaining trochars replaced with a 5 mm  trocar in the epigastrium a 5 mm troponin midline and a 5 mm trocar in the right lateral abdominal wall. Patient's placed into a reverse Trendelenburg left lateral decubitus position. Due to the tight nature of the gallbladder it was aspirated with a Weck needle. The returning aspirate was quite odorous and thickened with a purulent green appearance. At this point the fundus is grasped and lifted up and over the right lobe the liver with a regular grasper. Blunt dissection is carried out to dissect the peritoneal flexion off the infundibulum exposing both the cystic duct and cystic artery. A window was created behind both the structures. 3 endoclips placed proximally one distally and the cystic duct and the cystic duct was divided changed to 2 most distal clips. Similarly the cystic artery is identified. 2 endoclips placed proximally one distally and the cystic artery was divided between 2 most distal clips. At this point electrocautery was utilized dissect the gallbladder free from the gallbladder fossa. Hemostasis obtained using electrocautery during this dissection. At this point the 10 mm scope is exchanged for a 5 mm scope and the Endo Catch bag was inserted. The gallbladder is placed into an Endo Catch bag and placed into the right lower quadrant. At this point inspection of the gallbladder fossa indicated good hemostasis. The surgical field was copiously irrigated with sterile saline. At the returning aspirate returning clear. At this point the endoclips were inspected there is no SA bleeding or bile leak. Due to the raw nature of the gallbladder fossa I did opt to place a piece of Surgicel snow into the gallbladder fossa.  At this point I turned my attention to closure. Using Endo Close suture passing device a 2-0 Vicryl suture was passed through the umbilical trocar site. The gallbladder was  retrieved was removed through the umbilical trocar site and intact Endo Catch bag. Due to the size of the  gallbladder the trocar site did have to be enlarge both sharply and bluntly to adequately remove the gallbladder. Once free the gallbladder was placed in the back table was sent as a perm specimen to pathology. At this point the pneumoperitoneum was evacuated the trochars were removed. The local anesthetic was instilled. The Vicryl suture was secured. The skin edges reapproximated with skin staples. The skin was washed dried moist dry towel and sterile dressings were placed. The drapes removed the dressings were secured the patient was allowed to come out of general anesthetic was transferred to PACU in stable condition. At the conclusion of procedure all instrument, sponge, needle counts are correct. Patient tolerated procedure extremely well.  Complications:  None apparent  EBL:  150 ML's  Specimen:  Gallbladder

## 2011-06-10 NOTE — Progress Notes (Signed)
Day of Surgery  Subjective: No acute change   Objective: Vital signs in last 24 hours: Temp:  [99.5 F (37.5 Solis)-102.2 F (39 Solis)] 102.2 F (39 Solis) (12/23 0518) Pulse Rate:  [96-100] 96  (12/23 0518) Resp:  [20] 20  (12/23 0518) BP: (116-149)/(68-77) 149/77 mmHg (12/23 0518) SpO2:  [90 %-96 %] 92 % (12/23 0518) Last BM Date: 06/09/11  Intake/Output from previous day: 12/22 0701 - 12/23 0700 In: 4541.3 [I.V.:4531.3; IV Piggyback:10] Out: 1400 [Urine:1400] Intake/Output this shift:    General appearance: alert and no distress Resp: clear to auscultation bilaterally Cardio: regular rate and rhythm GI: mild RUQ pain.  No peritoneal signs  Lab Results:   [facility] 06/10/11 0620 06/09/11 0635  WBC 8.7 12.7*  HGB 10.1* 10.7*  HCT 30.4* 32.3*  PLT 172 167   BMET  Basename 06/10/11 0620 06/09/11 0635  NA 137 143  K 3.3* 3.4*  CL 104 110  CO2 23 25  GLUCOSE 108* 121*  BUN 21 31*  CREATININE 1.04 1.33  CALCIUM 8.6 8.9   PT/INR No results found for this basename: LABPROT:2,INR:2 in the last 72 hours ABG No results found for this basename: PHART:2,PCO2:2,PO2:2,HCO3:2 in the last 72 hours  Studies/Results: No results found.  Anti-infectives: Anti-infectives     Start     Dose/Rate Route Frequency Ordered Stop   06/08/11 1300   ertapenem (INVANZ) 1 g in sodium chloride 0.9 % 50 mL IVPB        1 g 100 mL/hr over 30 Minutes Intravenous Every 24 hours 06/08/11 1301     06/08/11 1100   piperacillin-tazobactam (ZOSYN) IVPB 3.375 g        3.375 g 12.5 mL/hr over 240 Minutes Intravenous  Once 06/08/11 1058 06/08/11 1509          Assessment/Plan: s/p Procedure(s): LAPAROSCOPIC CHOLECYSTECTOMY acute chole.  To the OR as discussed with the patient.  Risks, benefits and alternatives again discussed.  Will proceed.  LOS: 2 days    Kerry Solis 06/10/2011

## 2011-06-10 NOTE — Anesthesia Procedure Notes (Signed)
Procedure Name: Intubation Date/Time: 06/10/2011 9:29 AM Performed by: Despina Hidden Pre-anesthesia Checklist: Suction available, Emergency Drugs available, Patient being monitored and Patient identified Patient Re-evaluated:Patient Re-evaluated prior to inductionOxygen Delivery Method: Circle System Utilized Preoxygenation: Pre-oxygenation with 100% oxygen Intubation Type: Rapid sequence and Circoid Pressure applied Ventilation: Mask ventilation without difficulty Laryngoscope Size: 3 and Mac Grade View: Grade I Tube type: Oral Tube size: 8.0 mm Number of attempts: 1 Airway Equipment and Method: patient positioned with wedge pillow and stylet Placement Confirmation: positive ETCO2,  breath sounds checked- equal and bilateral and ETT inserted through vocal cords under direct vision Secured at: 22 cm Tube secured with: Tape Dental Injury: Teeth and Oropharynx as per pre-operative assessment

## 2011-06-11 LAB — CBC
HCT: 29.2 % — ABNORMAL LOW (ref 39.0–52.0)
Hemoglobin: 9.6 g/dL — ABNORMAL LOW (ref 13.0–17.0)
MCH: 30.2 pg (ref 26.0–34.0)
MCHC: 32.9 g/dL (ref 30.0–36.0)
MCV: 91.8 fL (ref 78.0–100.0)
Platelets: 181 10*3/uL (ref 150–400)
RBC: 3.18 MIL/uL — ABNORMAL LOW (ref 4.22–5.81)
RDW: 14.7 % (ref 11.5–15.5)
WBC: 7.6 10*3/uL (ref 4.0–10.5)

## 2011-06-11 LAB — BASIC METABOLIC PANEL
BUN: 21 mg/dL (ref 6–23)
CO2: 25 mEq/L (ref 19–32)
Calcium: 8.6 mg/dL (ref 8.4–10.5)
Chloride: 105 mEq/L (ref 96–112)
Creatinine, Ser: 1.13 mg/dL (ref 0.50–1.35)
GFR calc Af Amer: 76 mL/min — ABNORMAL LOW (ref >90)
GFR calc non Af Amer: 65 mL/min — ABNORMAL LOW (ref >90)
Glucose, Bld: 118 mg/dL — ABNORMAL HIGH (ref 70–99)
Potassium: 3.3 mEq/L — ABNORMAL LOW (ref 3.5–5.1)
Sodium: 139 mEq/L (ref 135–145)

## 2011-06-11 MED ORDER — OXYCODONE-ACETAMINOPHEN 5-325 MG PO TABS: 1.0000 | ORAL_TABLET | ORAL | Status: AC | PRN

## 2011-06-11 MED ORDER — CELECOXIB 100 MG PO CAPS
200.0000 mg | ORAL_CAPSULE | Freq: Two times a day (BID) | ORAL | Status: DC
  Administered 2011-06-11 – 2011-06-12 (×3): 200 mg via ORAL
  Filled 2011-06-11 (×5): qty 2

## 2011-06-11 NOTE — Progress Notes (Signed)
UR Chart Review Completed  

## 2011-06-11 NOTE — Addendum Note (Signed)
Addendum  created 06/11/11 1048 by Minerva Areola, CRNA   Modules edited:Notes Section

## 2011-06-11 NOTE — Progress Notes (Signed)
1 Day Post-Op  Subjective: Weak. No significant pain.  Tolerating clears.  No nausea.  Objective: Vital signs in last 24 hours: Temp:  [98.4 F (36.9 C)-103 F (39.4 C)] 99.8 F (37.7 C) (12/24 0612) Pulse Rate:  [63-104] 87  (12/24 0612) Resp:  [20-25] 20  (12/24 0612) BP: (106-177)/(57-85) 123/68 mmHg (12/24 0612) SpO2:  [85 %-97 %] 90 % (12/24 0612) Last BM Date: 06/10/11  Intake/Output from previous day: 12/23 0701 - 12/24 0700 In: 1300 [I.V.:1300] Out: 300 [Urine:300] Intake/Output this shift: Total I/O In: 180 [P.O.:180] Out: -   General appearance: alert and no distress GI: Soft, mod expected tenderness.  Inc c/d/i.  Lab Results:   [facility] 06/11/11 0628 06/10/11 0620  WBC 7.6 8.7  HGB 9.6* 10.1*  HCT 29.2* 30.4*  PLT 181 172   BMET  Basename 06/11/11 0628 06/10/11 0620  NA 139 137  K 3.3* 3.3*  CL 105 104  CO2 25 23  GLUCOSE 118* 108*  BUN 21 21  CREATININE 1.13 1.04  CALCIUM 8.6 8.6   PT/INR No results found for this basename: LABPROT:2,INR:2 in the last 72 hours ABG No results found for this basename: PHART:2,PCO2:2,PO2:2,HCO3:2 in the last 72 hours  Studies/Results: No results found.  Anti-infectives: Anti-infectives     Start     Dose/Rate Route Frequency Ordered Stop   06/10/11 2200  efavirenz-emtrictabine-tenofovir (ATRIPLA) 600-200-300 MG per tablet 1 tablet       1 tablet Oral Daily at bedtime 06/10/11 1205     06/08/11 1300   ertapenem (INVANZ) 1 g in sodium chloride 0.9 % 50 mL IVPB        1 g 100 mL/hr over 30 Minutes Intravenous Every 24 hours 06/08/11 1301     06/08/11 1100   piperacillin-tazobactam (ZOSYN) IVPB 3.375 g        3.375 g 12.5 mL/hr over 240 Minutes Intravenous  Once 06/08/11 1058 06/08/11 1509          Assessment/Plan: s/p Procedure(s): LAPAROSCOPIC CHOLECYSTECTOMY Continue to advance diet.  Stable.  Possible d/c in next 24 hours.  LOS: 3 days    Kerry Solis C 06/11/2011

## 2011-06-11 NOTE — Anesthesia Postprocedure Evaluation (Signed)
Anesthesia Post Note  Patient: Kerry Solis  Procedure(s) Performed:  LAPAROSCOPIC CHOLECYSTECTOMY  Anesthesia type: General  Patient location: 334  Post pain: Pain level controlled  Post assessment: Post-op Vital signs reviewed, Patient's Cardiovascular Status Stable, Respiratory Function Stable, Patent Airway, No signs of Nausea or vomiting and Pain level controlled  Last Vitals:  Filed Vitals:   06/11/11 0612  BP: 123/68  Pulse: 87  Temp: 37.7 C  Resp: 20    Post vital signs: Reviewed and stable  Level of consciousness: awake and alert   Complications: No apparent anesthesia complications

## 2011-06-12 LAB — CBC
HCT: 27.6 % — ABNORMAL LOW (ref 39.0–52.0)
Hemoglobin: 9.2 g/dL — ABNORMAL LOW (ref 13.0–17.0)
MCH: 30.5 pg (ref 26.0–34.0)
MCHC: 33.3 g/dL (ref 30.0–36.0)
MCV: 91.4 fL (ref 78.0–100.0)
Platelets: 187 10*3/uL (ref 150–400)
RBC: 3.02 MIL/uL — ABNORMAL LOW (ref 4.22–5.81)
RDW: 14.5 % (ref 11.5–15.5)
WBC: 6.5 10*3/uL (ref 4.0–10.5)

## 2011-06-12 LAB — BASIC METABOLIC PANEL
BUN: 21 mg/dL (ref 6–23)
CO2: 25 mEq/L (ref 19–32)
Calcium: 8.7 mg/dL (ref 8.4–10.5)
Chloride: 103 mEq/L (ref 96–112)
Creatinine, Ser: 1.16 mg/dL (ref 0.50–1.35)
GFR calc Af Amer: 73 mL/min — ABNORMAL LOW (ref >90)
GFR calc non Af Amer: 63 mL/min — ABNORMAL LOW (ref >90)
Glucose, Bld: 98 mg/dL (ref 70–99)
Potassium: 3 mEq/L — ABNORMAL LOW (ref 3.5–5.1)
Sodium: 138 mEq/L (ref 135–145)

## 2011-06-12 MED ORDER — OXYCODONE-ACETAMINOPHEN 5-325 MG PO TABS: 1.0000 | ORAL_TABLET | ORAL | Status: AC | PRN

## 2011-06-12 MED ORDER — PANTOPRAZOLE SODIUM 40 MG PO TBEC: 40.0000 mg | DELAYED_RELEASE_TABLET | Freq: Every day | ORAL | Status: DC

## 2011-06-12 NOTE — Discharge Summary (Signed)
Physician Discharge Summary  Patient ID: Kerry Solis MRN: [id number] DOB/AGE: 67-Sep-1947 67 y.o.  Admit date: 06/08/2011 Discharge date: 06/12/2011  Admission Diagnoses: Acute cholecystitis, cholelithiasis  Discharge Diagnoses: Acute cholecystitis, cholelithiasis Active Problems:  * No active hospital problems. *    Discharged Condition: good  Hospital Course: Patient is a 65 year old black male with multiple medical problems including multiple sclerosis and HIV positivity who was admitted to the hospital on 06/08/2011 with acute cholecystitis secondary to cholelithiasis. He subsequently was taken to the operating room by Dr. Tilford Pillar on 06/10/2011 and underwent laparoscopic cholecystectomy. Tolerated the procedure well. Postoperative course has been unremarkable. His diet was advanced at difficulty. He is being discharged home in good improving condition on 06/12/2011.  Treatments: surgery: Laparoscopic cholecystectomy on 06/10/2011  Discharge Exam: Blood pressure 126/73, pulse 77, temperature 98.2 F (36.8 C), temperature source Oral, resp. rate 18, height 5\' 9"  (1.753 m), weight 90.71 kg (199 lb 15.7 oz), SpO2 93.00%. General appearance: alert, cooperative and no distress Resp: clear to auscultation bilaterally Cardio: regular rate and rhythm, S1, S2 normal, no murmur, click, rub or gallop GI: Soft, nondistended. Incisions healing well. No rigidity noted.  Disposition: Home  Medication List  As of 06/12/2011 10:21 AM   START taking these medications         * oxyCODONE-acetaminophen 5-325 MG per tablet   Commonly known as: PERCOCET   Take 1-2 tablets by mouth every 4 (four) hours as needed.     * Notice: This list has 1 medication(s) that are the same as other medications prescribed for you. Read the directions carefully, and ask your doctor or other care provider to review them with you.       CHANGE how you take these medications         *  oxyCODONE-acetaminophen 5-325 MG per tablet   Commonly known as: PERCOCET   Take 1-2 tablets by mouth every 4 (four) hours as needed for pain.   What changed: dose     * Notice: This list has 1 medication(s) that are the same as other medications prescribed for you. Read the directions carefully, and ask your doctor or other care provider to review them with you.       CONTINUE taking these medications         allopurinol 300 MG tablet   Commonly known as: ZYLOPRIM      amitriptyline 50 MG tablet   Commonly known as: ELAVIL      aspirin EC 81 MG tablet      carbamazepine 100 MG chewable tablet   Commonly known as: TEGRETOL      efavirenz-emtrictabine-tenofovir 600-200-300 MG per tablet   Commonly known as: ATRIPLA      fish oil-omega-3 fatty acids 1000 MG capsule      folic acid 400 MCG tablet   Commonly known as: FOLVITE      hydrochlorothiazide 25 MG tablet   Commonly known as: HYDRODIURIL      interferon beta-1b 0.3 MG injection   Commonly known as: BETASERON      lisinopril 10 MG tablet   Commonly known as: PRINIVIL,ZESTRIL      multivitamin-iron-minerals-folic acid chewable tablet      omeprazole 20 MG capsule   Commonly known as: PRILOSEC      ondansetron 4 MG tablet   Commonly known as: ZOFRAN   Take 2 tablets (8 mg total) by mouth every 6 (six) hours.      pravastatin 20  MG tablet   Commonly known as: PRAVACHOL      pyridoxine 100 MG tablet   Commonly known as: B-6      triamcinolone cream 0.1 %   Commonly known as: KENALOG          Where to get your medications    These are the prescriptions that you need to pick up.   You may get these medications from any pharmacy.         oxyCODONE-acetaminophen 5-325 MG per tablet           Follow-up Information    Follow up with Fabio Bering, MD. Make an appointment on 06/21/2011.   Contact information:   [address] [place] [address] [phone]           Signed: Dalia Heading 06/12/2011, 10:21 AM

## 2011-06-12 NOTE — Progress Notes (Signed)
Writer discussed and reviewed discharge instructions and medications, verbalized understanding.  Encouraged to call with any questions that may arise.  Pt took all belongings and wheel chaired out in stable condition via staff member.  Pt in stable condition at time of discharge and in no distress.

## 2011-06-13 LAB — CULTURE, BLOOD (ROUTINE X 2)
Culture: NO GROWTH
Culture: NO GROWTH

## 2011-06-18 ENCOUNTER — Encounter (HOSPITAL_COMMUNITY): Payer: Self-pay | Admitting: General Surgery

## 2011-06-21 ENCOUNTER — Encounter (HOSPITAL_COMMUNITY): Payer: Self-pay | Admitting: General Surgery

## 2011-08-23 DIAGNOSIS — G35 Multiple sclerosis: Secondary | ICD-10-CM | POA: Diagnosis not present

## 2011-09-26 DIAGNOSIS — H5700 Unspecified anomaly of pupillary function: Secondary | ICD-10-CM | POA: Diagnosis not present

## 2011-10-10 DIAGNOSIS — B20 Human immunodeficiency virus [HIV] disease: Secondary | ICD-10-CM | POA: Diagnosis not present

## 2011-10-10 DIAGNOSIS — Z792 Long term (current) use of antibiotics: Secondary | ICD-10-CM | POA: Diagnosis not present

## 2011-10-10 DIAGNOSIS — K7689 Other specified diseases of liver: Secondary | ICD-10-CM | POA: Diagnosis not present

## 2011-11-14 DIAGNOSIS — B354 Tinea corporis: Secondary | ICD-10-CM | POA: Diagnosis not present

## 2011-11-14 DIAGNOSIS — I10 Essential (primary) hypertension: Secondary | ICD-10-CM | POA: Diagnosis not present

## 2012-01-14 DIAGNOSIS — I10 Essential (primary) hypertension: Secondary | ICD-10-CM | POA: Diagnosis not present

## 2012-01-14 DIAGNOSIS — N400 Enlarged prostate without lower urinary tract symptoms: Secondary | ICD-10-CM | POA: Diagnosis not present

## 2012-01-14 DIAGNOSIS — E785 Hyperlipidemia, unspecified: Secondary | ICD-10-CM | POA: Diagnosis not present

## 2012-01-18 DIAGNOSIS — R5381 Other malaise: Secondary | ICD-10-CM | POA: Diagnosis not present

## 2012-01-18 DIAGNOSIS — Z79899 Other long term (current) drug therapy: Secondary | ICD-10-CM | POA: Diagnosis not present

## 2012-01-18 DIAGNOSIS — Z125 Encounter for screening for malignant neoplasm of prostate: Secondary | ICD-10-CM | POA: Diagnosis not present

## 2012-01-18 DIAGNOSIS — E782 Mixed hyperlipidemia: Secondary | ICD-10-CM | POA: Diagnosis not present

## 2012-02-11 ENCOUNTER — Emergency Department (HOSPITAL_COMMUNITY)
Admission: EM | Admit: 2012-02-11 | Discharge: 2012-02-11 | Disposition: A | Discharge status: Home / Self Care | Payer: Medicare Other | Attending: Emergency Medicine | Admitting: Emergency Medicine

## 2012-02-11 ENCOUNTER — Encounter (HOSPITAL_COMMUNITY): Payer: Self-pay

## 2012-02-11 ENCOUNTER — Emergency Department (HOSPITAL_COMMUNITY): Payer: Medicare Other

## 2012-02-11 DIAGNOSIS — J984 Other disorders of lung: Secondary | ICD-10-CM | POA: Insufficient documentation

## 2012-02-11 DIAGNOSIS — I10 Essential (primary) hypertension: Secondary | ICD-10-CM | POA: Insufficient documentation

## 2012-02-11 DIAGNOSIS — J40 Bronchitis, not specified as acute or chronic: Secondary | ICD-10-CM | POA: Diagnosis not present

## 2012-02-11 DIAGNOSIS — R05 Cough: Secondary | ICD-10-CM | POA: Insufficient documentation

## 2012-02-11 DIAGNOSIS — R0789 Other chest pain: Secondary | ICD-10-CM | POA: Diagnosis not present

## 2012-02-11 DIAGNOSIS — R059 Cough, unspecified: Secondary | ICD-10-CM | POA: Diagnosis not present

## 2012-02-11 DIAGNOSIS — I252 Old myocardial infarction: Secondary | ICD-10-CM | POA: Insufficient documentation

## 2012-02-11 DIAGNOSIS — G35 Multiple sclerosis: Secondary | ICD-10-CM | POA: Diagnosis not present

## 2012-02-11 DIAGNOSIS — Z79899 Other long term (current) drug therapy: Secondary | ICD-10-CM | POA: Insufficient documentation

## 2012-02-11 DIAGNOSIS — K219 Gastro-esophageal reflux disease without esophagitis: Secondary | ICD-10-CM | POA: Insufficient documentation

## 2012-02-11 DIAGNOSIS — Z21 Asymptomatic human immunodeficiency virus [HIV] infection status: Secondary | ICD-10-CM | POA: Insufficient documentation

## 2012-02-11 LAB — CBC WITH DIFFERENTIAL/PLATELET
Basophils Absolute: 0 10*3/uL (ref 0.0–0.1)
Basophils Relative: 0 % (ref 0–1)
Eosinophils Absolute: 0.1 10*3/uL (ref 0.0–0.7)
Eosinophils Relative: 2 % (ref 0–5)
HCT: 36.5 % — ABNORMAL LOW (ref 39.0–52.0)
Hemoglobin: 11.9 g/dL — ABNORMAL LOW (ref 13.0–17.0)
Lymphocytes Relative: 37 % (ref 12–46)
Lymphs Abs: 1.6 10*3/uL (ref 0.7–4.0)
MCH: 30.2 pg (ref 26.0–34.0)
MCHC: 32.6 g/dL (ref 30.0–36.0)
MCV: 92.6 fL (ref 78.0–100.0)
Monocytes Absolute: 0.4 10*3/uL (ref 0.1–1.0)
Monocytes Relative: 10 % (ref 3–12)
Neutro Abs: 2.2 10*3/uL (ref 1.7–7.7)
Neutrophils Relative %: 50 % (ref 43–77)
Platelets: 205 10*3/uL (ref 150–400)
RBC: 3.94 MIL/uL — ABNORMAL LOW (ref 4.22–5.81)
RDW: 14.7 % (ref 11.5–15.5)
WBC: 4.4 10*3/uL (ref 4.0–10.5)

## 2012-02-11 LAB — COMPREHENSIVE METABOLIC PANEL
ALT: 81 U/L — ABNORMAL HIGH (ref 0–53)
AST: 84 U/L — ABNORMAL HIGH (ref 0–37)
Albumin: 3.7 g/dL (ref 3.5–5.2)
Alkaline Phosphatase: 108 U/L (ref 39–117)
BUN: 20 mg/dL (ref 6–23)
CO2: 23 mEq/L (ref 19–32)
Calcium: 9.6 mg/dL (ref 8.4–10.5)
Chloride: 109 mEq/L (ref 96–112)
Creatinine, Ser: 1.09 mg/dL (ref 0.50–1.35)
GFR calc Af Amer: 79 mL/min — ABNORMAL LOW (ref >90)
GFR calc non Af Amer: 68 mL/min — ABNORMAL LOW (ref >90)
Glucose, Bld: 138 mg/dL — ABNORMAL HIGH (ref 70–99)
Potassium: 3.5 mEq/L (ref 3.5–5.1)
Sodium: 142 mEq/L (ref 135–145)
Total Bilirubin: 0.2 mg/dL — ABNORMAL LOW (ref 0.3–1.2)
Total Protein: 7.8 g/dL (ref 6.0–8.3)

## 2012-02-11 LAB — POCT I-STAT TROPONIN I: Troponin i, poc: 0.02 ng/mL (ref 0.00–0.08)

## 2012-02-11 NOTE — ED Notes (Signed)
Pt also reporting to this nurse that he has a "knot" on his upper right leg. Pt states it is painful to touch and when he walks.

## 2012-02-11 NOTE — ED Notes (Signed)
Pt discharged. Pt stable at time of discharge. pt has no questions regarding discharge at this time. Pt voiced understanding of discharge instructions.  

## 2012-02-11 NOTE — ED Provider Notes (Addendum)
History     CSN: [id number]  Arrival date & time 02/11/12  1324   First MD Initiated Contact with Patient 02/11/12 0455      Chief Complaint  Patient presents with  . Chest Pain    (Consider location/radiation/quality/duration/timing/severity/associated sxs/prior treatment) HPI Kerry Solis is a 66 y.o. male  With a h/o MS, HTN, HIV who presents to the Emergency Department complaining of sudden onset burning pain in the center of his chest that woke him at 3 AM associated with coughing and a nasty taste. Patient got up and drank some water which did not improve the burning. Denies fever, chills, vomiting, shortness of breath  PCP Dr. Gerda Diss   Past Medical History  Diagnosis Date  . Multiple sclerosis   . Hypertension   . HIV (human immunodeficiency virus infection)   . Myocardial infarction     Past Surgical History  Procedure Date  . Cholecystectomy 06/10/2011    Procedure: LAPAROSCOPIC CHOLECYSTECTOMY;  Surgeon: Fabio Bering, MD;  Location: [facility];  Service: General;  Laterality: N/A;  . Cholecystectomy 06/10/2011    Procedure: LAPAROSCOPIC CHOLECYSTECTOMY;  Surgeon: Fabio Bering, MD;  Location: [facility];  Service: General;  Laterality: N/A;    No family history on file.  History  Substance Use Topics  . Smoking status: Never Smoker   . Smokeless tobacco: Not on file  . Alcohol Use: No      Review of Systems  Constitutional: Negative for fever.       10 Systems reviewed and are negative for acute change except as noted in the HPI.  HENT: Negative for congestion.   Eyes: Negative for discharge and redness.  Respiratory: Positive for cough. Negative for shortness of breath.        Burning chest discomfort.  Cardiovascular: Negative for chest pain.  Gastrointestinal: Negative for vomiting and abdominal pain.  Musculoskeletal: Negative for back pain.  Skin: Negative for rash.  Neurological: Negative for syncope, numbness and headaches.    Psychiatric/Behavioral:       No behavior change.    Allergies  Review of patient's allergies indicates no known allergies.  Home Medications   Current Outpatient Rx  Name Route Sig Dispense Refill  . ALLOPURINOL 300 MG PO TABS Oral Take 300 mg by mouth daily.      Marland Kitchen AMITRIPTYLINE HCL 50 MG PO TABS Oral Take 50 mg by mouth at bedtime.      . ASPIRIN EC 81 MG PO TBEC Oral Take 81 mg by mouth daily.      Marland Kitchen CARBAMAZEPINE 100 MG PO CHEW Oral Chew 100 mg by mouth 2 (two) times daily.      . EFAVIRENZ-EMTRICITAB-TENOFOVIR 600-200-300 MG PO TABS Oral Take 1 tablet by mouth at bedtime.      . OMEGA-3 FATTY ACIDS 1000 MG PO CAPS Oral Take 2 g by mouth daily.      Marland Kitchen FOLIC ACID 400 MCG PO TABS Oral Take 400 mcg by mouth daily.      Marland Kitchen HYDROCHLOROTHIAZIDE 25 MG PO TABS Oral Take 25 mg by mouth daily.      . INTERFERON BETA-1B 0.3 MG [place] SOLR Subcutaneous Inject 44 mg into the skin every Monday, Wednesday, and Friday.      Marland Kitchen LISINOPRIL 10 MG PO TABS Oral Take 10 mg by mouth daily.      . CENTRUM PO CHEW Oral Chew 1 tablet by mouth daily.      Marland Kitchen OMEPRAZOLE 20 MG PO  CPDR Oral Take 20 mg by mouth daily.      Marland Kitchen PRAVASTATIN SODIUM 20 MG PO TABS Oral Take 20 mg by mouth daily.      Marland Kitchen PYRIDOXINE HCL 100 MG PO TABS Oral Take 100 mg by mouth daily.      . TRIAMCINOLONE ACETONIDE 0.1 % EX CREA Topical Apply 1 application topically 2 (two) times daily as needed. For itching       BP 175/72  Pulse 97  Temp 98.9 F (37.2 C) (Oral)  SpO2 97%  Physical Exam  Nursing note and vitals reviewed. Constitutional: He appears well-developed and well-nourished.       Awake, alert, nontoxic appearance.  HENT:  Head: Atraumatic.  Right Ear: External ear normal.  Left Ear: External ear normal.  Nose: Nose normal.  Mouth/Throat: Oropharynx is clear and moist.  Eyes: Conjunctivae and EOM are normal. Pupils are equal, round, and reactive to light. Right eye exhibits no discharge. Left eye exhibits no discharge.   Neck: Neck supple.  Cardiovascular: Normal heart sounds.   Pulmonary/Chest: Effort normal and breath sounds normal. He exhibits no tenderness.  Abdominal: Soft. There is no tenderness. There is no rebound.  Musculoskeletal: He exhibits no tenderness.       Baseline ROM, no obvious new focal weakness.  Neurological:       Mental status and motor strength appears baseline for patient and situation.  Skin: No rash noted.       Area of tenderness and swelling to right thigh c/w hematoma from injection site.  Psychiatric: He has a normal mood and affect.    ED Course  Procedures (including critical care time) Results for orders placed during the hospital encounter of 02/11/12  CBC WITH DIFFERENTIAL      Component Value Range   WBC 4.4  4.0 - 10.5 K/uL   RBC 3.94 (*) 4.22 - 5.81 MIL/uL   Hemoglobin 11.9 (*) 13.0 - 17.0 g/dL   HCT 78.2 (*) 95.6 - 21.3 %   MCV 92.6  78.0 - 100.0 fL   MCH 30.2  26.0 - 34.0 pg   MCHC 32.6  30.0 - 36.0 g/dL   RDW 08.6  57.8 - 46.9 %   Platelets 205  150 - 400 K/uL   Neutrophils Relative 50  43 - 77 %   Neutro Abs 2.2  1.7 - 7.7 K/uL   Lymphocytes Relative 37  12 - 46 %   Lymphs Abs 1.6  0.7 - 4.0 K/uL   Monocytes Relative 10  3 - 12 %   Monocytes Absolute 0.4  0.1 - 1.0 K/uL   Eosinophils Relative 2  0 - 5 %   Eosinophils Absolute 0.1  0.0 - 0.7 K/uL   Basophils Relative 0  0 - 1 %   Basophils Absolute 0.0  0.0 - 0.1 K/uL  COMPREHENSIVE METABOLIC PANEL      Component Value Range   Sodium 142  135 - 145 mEq/L   Potassium 3.5  3.5 - 5.1 mEq/L   Chloride 109  96 - 112 mEq/L   CO2 23  19 - 32 mEq/L   Glucose, Bld 138 (*) 70 - 99 mg/dL   BUN 20  6 - 23 mg/dL   Creatinine, Ser 6.29  0.50 - 1.35 mg/dL   Calcium 9.6  8.4 - 52.8 mg/dL   Total Protein 7.8  6.0 - 8.3 g/dL   Albumin 3.7  3.5 - 5.2 g/dL   AST 84 (*) 0 -  37 U/L   ALT 81 (*) 0 - 53 U/L   Alkaline Phosphatase 108  39 - 117 U/L   Total Bilirubin 0.2 (*) 0.3 - 1.2 mg/dL   GFR calc non Af  Amer 68 (*) >90 mL/min   GFR calc Af Amer 79 (*) >90 mL/min  POCT I-STAT TROPONIN I      Component Value Range   Troponin i, poc 0.02  0.00 - 0.08 ng/mL   Comment 3              Date: 02/11/2012  0443  Rate:91  Rhythm: normal sinus rhythm  QRS Axis: left  Intervals: QT prolonged  ST/T Wave abnormalities: normal  Conduction Disutrbances:first-degree A-V block   Narrative Interpretation:   Old EKG Reviewed: changes noted c/w/ 06/10/11 new 1st degree block Dg Chest Portable 1 View  02/11/2012  *RADIOLOGY REPORT*  Clinical Data: Chest pain  PORTABLE CHEST - 1 VIEW  Comparison: None.  Findings: Elevated right hemidiaphragm.  Mild interstitial coarsening/linear scarring at the bases. Mild hazy opacity along the periphery of the lungs bilaterally.  Calcified granuloma left lower lobe.  Cardiomediastinal contours within normal range. Aortic arch atherosclerotic calcification.  No pleural effusion or pneumothorax.  Multilevel degenerative changes.  No acute osseous finding.  IMPRESSION: Mild bronchitic changes and lung base scarring.  Mild hazy opacity along the periphery of the lungs bilaterally is favored to be artifact from overlying soft tissue rather than a lung process.  Consider PA and lateral views when the patient can tolerate for better characterization.   Original Report Authenticated By: Waneta Martins, M.D.     MDM  Patient presents with burning central chest pain associated with cough and reflux. Labs are unremarkable, chest xray negative, EKG normal.Given protonix. Dx testing d/w pt.  Questions answered.  Verb understanding, agreeable to d/c home with outpt f/u.  Pt stable in ED with no significant deterioration in condition.The patient appears reasonably screened and/or stabilized for discharge and I doubt any other medical condition or other [facility] requiring further screening, evaluation, or treatment in the ED at this time prior to discharge.  MDM Reviewed: nursing note and  vitals Interpretation: labs, ECG and x-ray           Nicoletta Dress. Colon Branch, MD 02/11/12 1610  Nicoletta Dress. Colon Branch, MD 02/11/12 0600

## 2012-02-11 NOTE — ED Notes (Signed)
Pt reports approx 30 minutes ago he developed sudden burning type pain in the center of his chest and down into upper abd, states he feels like he may pass out if he coughs, and also states he has a "nasty taste in his mouth"

## 2012-02-13 DIAGNOSIS — L02419 Cutaneous abscess of limb, unspecified: Secondary | ICD-10-CM | POA: Diagnosis not present

## 2012-02-13 DIAGNOSIS — K219 Gastro-esophageal reflux disease without esophagitis: Secondary | ICD-10-CM | POA: Diagnosis not present

## 2012-02-13 DIAGNOSIS — I10 Essential (primary) hypertension: Secondary | ICD-10-CM | POA: Diagnosis not present

## 2012-02-13 DIAGNOSIS — L03119 Cellulitis of unspecified part of limb: Secondary | ICD-10-CM | POA: Diagnosis not present

## 2012-02-25 DIAGNOSIS — G35 Multiple sclerosis: Secondary | ICD-10-CM | POA: Diagnosis not present

## 2012-02-27 DIAGNOSIS — N3943 Post-void dribbling: Secondary | ICD-10-CM | POA: Diagnosis not present

## 2012-02-27 DIAGNOSIS — N400 Enlarged prostate without lower urinary tract symptoms: Secondary | ICD-10-CM | POA: Diagnosis not present

## 2012-03-11 DIAGNOSIS — G35 Multiple sclerosis: Secondary | ICD-10-CM | POA: Diagnosis not present

## 2012-03-11 DIAGNOSIS — M47812 Spondylosis without myelopathy or radiculopathy, cervical region: Secondary | ICD-10-CM | POA: Diagnosis not present

## 2012-03-11 DIAGNOSIS — M4120 Other idiopathic scoliosis, site unspecified: Secondary | ICD-10-CM | POA: Diagnosis not present

## 2012-03-31 DIAGNOSIS — E785 Hyperlipidemia, unspecified: Secondary | ICD-10-CM | POA: Diagnosis not present

## 2012-03-31 DIAGNOSIS — N289 Disorder of kidney and ureter, unspecified: Secondary | ICD-10-CM | POA: Diagnosis not present

## 2012-03-31 DIAGNOSIS — Z23 Encounter for immunization: Secondary | ICD-10-CM | POA: Diagnosis not present

## 2012-03-31 DIAGNOSIS — R7309 Other abnormal glucose: Secondary | ICD-10-CM | POA: Diagnosis not present

## 2012-04-09 DIAGNOSIS — G35 Multiple sclerosis: Secondary | ICD-10-CM | POA: Diagnosis not present

## 2012-04-09 DIAGNOSIS — I10 Essential (primary) hypertension: Secondary | ICD-10-CM | POA: Diagnosis not present

## 2012-04-09 DIAGNOSIS — B20 Human immunodeficiency virus [HIV] disease: Secondary | ICD-10-CM | POA: Diagnosis not present

## 2012-04-09 DIAGNOSIS — Z792 Long term (current) use of antibiotics: Secondary | ICD-10-CM | POA: Diagnosis not present

## 2012-04-30 DIAGNOSIS — M545 Low back pain, unspecified: Secondary | ICD-10-CM | POA: Diagnosis not present

## 2012-04-30 DIAGNOSIS — R262 Difficulty in walking, not elsewhere classified: Secondary | ICD-10-CM | POA: Diagnosis not present

## 2012-05-07 DIAGNOSIS — R262 Difficulty in walking, not elsewhere classified: Secondary | ICD-10-CM | POA: Diagnosis not present

## 2012-05-07 DIAGNOSIS — M545 Low back pain, unspecified: Secondary | ICD-10-CM | POA: Diagnosis not present

## 2012-05-09 DIAGNOSIS — R262 Difficulty in walking, not elsewhere classified: Secondary | ICD-10-CM | POA: Diagnosis not present

## 2012-05-09 DIAGNOSIS — M545 Low back pain, unspecified: Secondary | ICD-10-CM | POA: Diagnosis not present

## 2012-05-13 DIAGNOSIS — M545 Low back pain, unspecified: Secondary | ICD-10-CM | POA: Diagnosis not present

## 2012-05-13 DIAGNOSIS — R262 Difficulty in walking, not elsewhere classified: Secondary | ICD-10-CM | POA: Diagnosis not present

## 2012-05-26 DIAGNOSIS — R262 Difficulty in walking, not elsewhere classified: Secondary | ICD-10-CM | POA: Diagnosis not present

## 2012-05-26 DIAGNOSIS — M545 Low back pain, unspecified: Secondary | ICD-10-CM | POA: Diagnosis not present

## 2012-05-27 DIAGNOSIS — M545 Low back pain, unspecified: Secondary | ICD-10-CM | POA: Diagnosis not present

## 2012-05-27 DIAGNOSIS — R262 Difficulty in walking, not elsewhere classified: Secondary | ICD-10-CM | POA: Diagnosis not present

## 2012-05-30 DIAGNOSIS — M545 Low back pain, unspecified: Secondary | ICD-10-CM | POA: Diagnosis not present

## 2012-05-30 DIAGNOSIS — R262 Difficulty in walking, not elsewhere classified: Secondary | ICD-10-CM | POA: Diagnosis not present

## 2012-06-03 DIAGNOSIS — R262 Difficulty in walking, not elsewhere classified: Secondary | ICD-10-CM | POA: Diagnosis not present

## 2012-06-03 DIAGNOSIS — M545 Low back pain, unspecified: Secondary | ICD-10-CM | POA: Diagnosis not present

## 2012-06-06 DIAGNOSIS — M545 Low back pain, unspecified: Secondary | ICD-10-CM | POA: Diagnosis not present

## 2012-06-06 DIAGNOSIS — R262 Difficulty in walking, not elsewhere classified: Secondary | ICD-10-CM | POA: Diagnosis not present

## 2012-06-17 DIAGNOSIS — R262 Difficulty in walking, not elsewhere classified: Secondary | ICD-10-CM | POA: Diagnosis not present

## 2012-06-17 DIAGNOSIS — M545 Low back pain, unspecified: Secondary | ICD-10-CM | POA: Diagnosis not present

## 2012-06-20 DIAGNOSIS — R262 Difficulty in walking, not elsewhere classified: Secondary | ICD-10-CM | POA: Diagnosis not present

## 2012-06-20 DIAGNOSIS — M545 Low back pain, unspecified: Secondary | ICD-10-CM | POA: Diagnosis not present

## 2012-06-24 DIAGNOSIS — M545 Low back pain, unspecified: Secondary | ICD-10-CM | POA: Diagnosis not present

## 2012-06-24 DIAGNOSIS — R262 Difficulty in walking, not elsewhere classified: Secondary | ICD-10-CM | POA: Diagnosis not present

## 2012-06-27 DIAGNOSIS — M545 Low back pain, unspecified: Secondary | ICD-10-CM | POA: Diagnosis not present

## 2012-06-27 DIAGNOSIS — R262 Difficulty in walking, not elsewhere classified: Secondary | ICD-10-CM | POA: Diagnosis not present

## 2012-07-02 DIAGNOSIS — M545 Low back pain, unspecified: Secondary | ICD-10-CM | POA: Diagnosis not present

## 2012-07-02 DIAGNOSIS — R262 Difficulty in walking, not elsewhere classified: Secondary | ICD-10-CM | POA: Diagnosis not present

## 2012-07-04 DIAGNOSIS — M545 Low back pain, unspecified: Secondary | ICD-10-CM | POA: Diagnosis not present

## 2012-07-04 DIAGNOSIS — R262 Difficulty in walking, not elsewhere classified: Secondary | ICD-10-CM | POA: Diagnosis not present

## 2012-07-11 DIAGNOSIS — M545 Low back pain, unspecified: Secondary | ICD-10-CM | POA: Diagnosis not present

## 2012-07-11 DIAGNOSIS — R262 Difficulty in walking, not elsewhere classified: Secondary | ICD-10-CM | POA: Diagnosis not present

## 2012-07-14 DIAGNOSIS — N289 Disorder of kidney and ureter, unspecified: Secondary | ICD-10-CM | POA: Diagnosis not present

## 2012-07-14 DIAGNOSIS — J069 Acute upper respiratory infection, unspecified: Secondary | ICD-10-CM | POA: Diagnosis not present

## 2012-07-14 DIAGNOSIS — I10 Essential (primary) hypertension: Secondary | ICD-10-CM | POA: Diagnosis not present

## 2012-07-15 DIAGNOSIS — R262 Difficulty in walking, not elsewhere classified: Secondary | ICD-10-CM | POA: Diagnosis not present

## 2012-07-15 DIAGNOSIS — M545 Low back pain, unspecified: Secondary | ICD-10-CM | POA: Diagnosis not present

## 2012-07-18 DIAGNOSIS — R262 Difficulty in walking, not elsewhere classified: Secondary | ICD-10-CM | POA: Diagnosis not present

## 2012-07-18 DIAGNOSIS — M545 Low back pain, unspecified: Secondary | ICD-10-CM | POA: Diagnosis not present

## 2012-07-22 DIAGNOSIS — M545 Low back pain, unspecified: Secondary | ICD-10-CM | POA: Diagnosis not present

## 2012-07-22 DIAGNOSIS — R262 Difficulty in walking, not elsewhere classified: Secondary | ICD-10-CM | POA: Diagnosis not present

## 2012-07-23 DIAGNOSIS — R5381 Other malaise: Secondary | ICD-10-CM | POA: Diagnosis not present

## 2012-07-23 DIAGNOSIS — E782 Mixed hyperlipidemia: Secondary | ICD-10-CM | POA: Diagnosis not present

## 2012-07-23 DIAGNOSIS — R5383 Other fatigue: Secondary | ICD-10-CM | POA: Diagnosis not present

## 2012-07-23 DIAGNOSIS — Z79899 Other long term (current) drug therapy: Secondary | ICD-10-CM | POA: Diagnosis not present

## 2012-07-25 DIAGNOSIS — R262 Difficulty in walking, not elsewhere classified: Secondary | ICD-10-CM | POA: Diagnosis not present

## 2012-07-25 DIAGNOSIS — M545 Low back pain, unspecified: Secondary | ICD-10-CM | POA: Diagnosis not present

## 2012-09-04 DIAGNOSIS — G35 Multiple sclerosis: Secondary | ICD-10-CM | POA: Diagnosis not present

## 2012-09-15 ENCOUNTER — Encounter: Payer: Self-pay | Admitting: *Deleted

## 2012-09-16 ENCOUNTER — Ambulatory Visit (INDEPENDENT_AMBULATORY_CARE_PROVIDER_SITE_OTHER): Payer: Medicare Other | Admitting: Family Medicine

## 2012-09-16 ENCOUNTER — Encounter: Payer: Self-pay | Admitting: Family Medicine

## 2012-09-16 VITALS — BP 150/90 | HR 70 | Ht 69.0 in | Wt 195.0 lb

## 2012-09-16 DIAGNOSIS — I10 Essential (primary) hypertension: Secondary | ICD-10-CM

## 2012-09-16 DIAGNOSIS — K029 Dental caries, unspecified: Secondary | ICD-10-CM

## 2012-09-16 MED ORDER — HYDROCODONE-ACETAMINOPHEN 5-325 MG PO TABS: 1.0000 | ORAL_TABLET | ORAL | Status: DC | PRN

## 2012-09-16 NOTE — Patient Instructions (Signed)
I would recommend connecting with [facility] dent[facility] in order to have this dental problem taking care of.  In July please call us for a wellness exam plus also lab work. Our staff can give you the necessary orders to do your lab work before your visit.

## 2012-09-16 NOTE — Progress Notes (Signed)
  Subjective:    Patient ID: Kerry Solis, male    DOB: April 07, 1946, 67 y.o.   MRN: [id number]  Hypertension Pertinent negatives include no chest pain, headaches or neck pain.  Dental Pain  This is a new problem. The current episode started in the past 7 days. The problem occurs constantly. The problem has been gradually worsening. The pain is at a severity of 5/10. The pain is moderate. Pertinent negatives include no fever. He has tried NSAIDs for the symptoms. The treatment provided mild relief.   Takes med regularly   Review of Systems  Constitutional: Negative for fever, activity change and appetite change.  HENT: Positive for dental problem. Negative for congestion, rhinorrhea and neck pain.   Eyes: Negative for discharge.  Respiratory: Negative for cough and wheezing.   Cardiovascular: Negative for chest pain.  Gastrointestinal: Negative for vomiting, abdominal pain and blood in stool.  Genitourinary: Negative for frequency and difficulty urinating.  Skin: Negative for rash.  Allergic/Immunologic: Negative for environmental allergies and food allergies.  Neurological: Negative for weakness and headaches.  Psychiatric/Behavioral: Negative for agitation.       Objective:   Physical Exam  Constitutional: He appears well-developed and well-nourished.  HENT:  Head: Normocephalic and atraumatic.  Nose: Nose normal.  Mouth/Throat: Oropharynx is clear and moist.  Severe dental caries  Eyes: EOM are normal. Pupils are equal, round, and reactive to light.  Neck: Normal range of motion. Neck supple. No thyromegaly present.  Cardiovascular: Normal rate, regular rhythm and normal heart sounds.   No murmur heard. Pulmonary/Chest: Effort normal and breath sounds normal. No respiratory distress. He has no wheezes.  Abdominal: Soft. Bowel sounds are normal. He exhibits no distension and no mass. There is no tenderness.  Musculoskeletal: Normal range of motion. He exhibits no edema.   Lymphadenopathy:    He has no cervical adenopathy.  Neurological: He is alert. He exhibits normal muscle tone.  Skin: Skin is warm and dry. No erythema.  Psychiatric: He has a normal mood and affect. His behavior is normal. Judgment normal.          Assessment & Plan:  No diagnosis found. He

## 2012-09-30 DIAGNOSIS — E785 Hyperlipidemia, unspecified: Secondary | ICD-10-CM | POA: Diagnosis not present

## 2012-09-30 DIAGNOSIS — K089 Disorder of teeth and supporting structures, unspecified: Secondary | ICD-10-CM | POA: Diagnosis not present

## 2012-09-30 DIAGNOSIS — I10 Essential (primary) hypertension: Secondary | ICD-10-CM | POA: Diagnosis not present

## 2012-09-30 DIAGNOSIS — G35 Multiple sclerosis: Secondary | ICD-10-CM | POA: Diagnosis not present

## 2012-09-30 DIAGNOSIS — Z792 Long term (current) use of antibiotics: Secondary | ICD-10-CM | POA: Diagnosis not present

## 2012-09-30 DIAGNOSIS — K0889 Other specified disorders of teeth and supporting structures: Secondary | ICD-10-CM | POA: Insufficient documentation

## 2012-09-30 DIAGNOSIS — B20 Human immunodeficiency virus [HIV] disease: Secondary | ICD-10-CM | POA: Diagnosis not present

## 2012-10-26 ENCOUNTER — Emergency Department (HOSPITAL_COMMUNITY)
Admission: EM | Admit: 2012-10-26 | Discharge: 2012-10-26 | Disposition: A | Discharge status: Home / Self Care | Payer: Medicare Other | Attending: Emergency Medicine | Admitting: Emergency Medicine

## 2012-10-26 ENCOUNTER — Encounter (HOSPITAL_COMMUNITY): Payer: Self-pay | Admitting: Emergency Medicine

## 2012-10-26 DIAGNOSIS — Z21 Asymptomatic human immunodeficiency virus [HIV] infection status: Secondary | ICD-10-CM | POA: Insufficient documentation

## 2012-10-26 DIAGNOSIS — Z79899 Other long term (current) drug therapy: Secondary | ICD-10-CM | POA: Diagnosis not present

## 2012-10-26 DIAGNOSIS — Z7982 Long term (current) use of aspirin: Secondary | ICD-10-CM | POA: Diagnosis not present

## 2012-10-26 DIAGNOSIS — R197 Diarrhea, unspecified: Secondary | ICD-10-CM | POA: Insufficient documentation

## 2012-10-26 DIAGNOSIS — I252 Old myocardial infarction: Secondary | ICD-10-CM | POA: Insufficient documentation

## 2012-10-26 DIAGNOSIS — N289 Disorder of kidney and ureter, unspecified: Secondary | ICD-10-CM | POA: Diagnosis not present

## 2012-10-26 DIAGNOSIS — Z862 Personal history of diseases of the blood and blood-forming organs and certain disorders involving the immune mechanism: Secondary | ICD-10-CM | POA: Insufficient documentation

## 2012-10-26 DIAGNOSIS — R109 Unspecified abdominal pain: Secondary | ICD-10-CM | POA: Insufficient documentation

## 2012-10-26 DIAGNOSIS — Z8669 Personal history of other diseases of the nervous system and sense organs: Secondary | ICD-10-CM | POA: Insufficient documentation

## 2012-10-26 DIAGNOSIS — Z8661 Personal history of infections of the central nervous system: Secondary | ICD-10-CM | POA: Insufficient documentation

## 2012-10-26 DIAGNOSIS — R5383 Other fatigue: Secondary | ICD-10-CM | POA: Diagnosis not present

## 2012-10-26 DIAGNOSIS — Z8639 Personal history of other endocrine, nutritional and metabolic disease: Secondary | ICD-10-CM | POA: Insufficient documentation

## 2012-10-26 DIAGNOSIS — R5381 Other malaise: Secondary | ICD-10-CM | POA: Insufficient documentation

## 2012-10-26 DIAGNOSIS — I10 Essential (primary) hypertension: Secondary | ICD-10-CM | POA: Insufficient documentation

## 2012-10-26 LAB — CBC WITH DIFFERENTIAL/PLATELET
Basophils Absolute: 0 10*3/uL (ref 0.0–0.1)
Basophils Relative: 0 % (ref 0–1)
Eosinophils Absolute: 0 10*3/uL (ref 0.0–0.7)
Eosinophils Relative: 0 % (ref 0–5)
HCT: 36.5 % — ABNORMAL LOW (ref 39.0–52.0)
Hemoglobin: 12.3 g/dL — ABNORMAL LOW (ref 13.0–17.0)
Lymphocytes Relative: 10 % — ABNORMAL LOW (ref 12–46)
Lymphs Abs: 1 10*3/uL (ref 0.7–4.0)
MCH: 30.5 pg (ref 26.0–34.0)
MCHC: 33.7 g/dL (ref 30.0–36.0)
MCV: 90.6 fL (ref 78.0–100.0)
Monocytes Absolute: 0.8 10*3/uL (ref 0.1–1.0)
Monocytes Relative: 8 % (ref 3–12)
Neutro Abs: 8.3 10*3/uL — ABNORMAL HIGH (ref 1.7–7.7)
Neutrophils Relative %: 82 % — ABNORMAL HIGH (ref 43–77)
Platelets: 163 10*3/uL (ref 150–400)
RBC: 4.03 MIL/uL — ABNORMAL LOW (ref 4.22–5.81)
RDW: 14.6 % (ref 11.5–15.5)
WBC: 10 10*3/uL (ref 4.0–10.5)

## 2012-10-26 LAB — URINALYSIS, ROUTINE W REFLEX MICROSCOPIC
Bilirubin Urine: NEGATIVE
Glucose, UA: NEGATIVE mg/dL
Hgb urine dipstick: NEGATIVE
Ketones, ur: NEGATIVE mg/dL
Leukocytes, UA: NEGATIVE
Nitrite: NEGATIVE
Protein, ur: NEGATIVE mg/dL
Specific Gravity, Urine: 1.025 (ref 1.005–1.030)
Urobilinogen, UA: 0.2 mg/dL (ref 0.0–1.0)
pH: 6 (ref 5.0–8.0)

## 2012-10-26 LAB — COMPREHENSIVE METABOLIC PANEL
ALT: 22 U/L (ref 0–53)
AST: 30 U/L (ref 0–37)
Albumin: 3.9 g/dL (ref 3.5–5.2)
Alkaline Phosphatase: 99 U/L (ref 39–117)
BUN: 26 mg/dL — ABNORMAL HIGH (ref 6–23)
CO2: 19 mEq/L (ref 19–32)
Calcium: 9.1 mg/dL (ref 8.4–10.5)
Chloride: 101 mEq/L (ref 96–112)
Creatinine, Ser: 1.83 mg/dL — ABNORMAL HIGH (ref 0.50–1.35)
GFR calc Af Amer: 42 mL/min — ABNORMAL LOW (ref >90)
GFR calc non Af Amer: 36 mL/min — ABNORMAL LOW (ref >90)
Glucose, Bld: 115 mg/dL — ABNORMAL HIGH (ref 70–99)
Potassium: 3.4 mEq/L — ABNORMAL LOW (ref 3.5–5.1)
Sodium: 137 mEq/L (ref 135–145)
Total Bilirubin: 0.3 mg/dL (ref 0.3–1.2)
Total Protein: 8.1 g/dL (ref 6.0–8.3)

## 2012-10-26 MED ORDER — LOPERAMIDE HCL 2 MG PO CAPS
4.0000 mg | ORAL_CAPSULE | Freq: Once | ORAL | Status: AC
  Administered 2012-10-26: 4 mg via ORAL
  Filled 2012-10-26: qty 2

## 2012-10-26 MED ORDER — SODIUM CHLORIDE 0.9 % IV BOLUS (SEPSIS)
1000.0000 mL | Freq: Once | INTRAVENOUS | Status: AC
  Administered 2012-10-26: 1000 mL via INTRAVENOUS

## 2012-10-26 NOTE — ED Notes (Signed)
Over last 2 days pt stats weakness and diarrhea. States he feels like he has no energy, pt denies any abdominal pain, nausea, and vomiting.

## 2012-10-26 NOTE — ED Notes (Signed)
Patient c/o diarrhea and feeling weak x 2 days.  Denies nausea and vomiting.

## 2012-10-26 NOTE — ED Notes (Signed)
Pt states unable to have BM at this time for cdiff sample

## 2012-10-26 NOTE — ED Provider Notes (Signed)
History     CSN: [id number]  Arrival date & time 10/26/12  0225   First MD Initiated Contact with Patient 10/26/12 0244      Chief Complaint  Patient presents with  . Diarrhea  . Fatigue    (Consider location/radiation/quality/duration/timing/severity/associated sxs/prior treatment) Patient is a 67 y.o. male presenting with diarrhea. The history is provided by the patient.  Diarrhea He had onset yesterday morning of severe diarrhea. He states that he was on the commode almost constantly through the morning. There is some crampy abdominal pain which has since resolved. He denies nausea or vomiting. He has had chills and sweats but no fever. He denies blood or mucus in the stool. He denies sick contacts. He completed a course of antibiotics for a dental abscess in about 5 days ago. Symptoms are severe. Nothing makes it better nothing makes it worse. Last episode of diarrhea was just before he left to come to the ED. He is feeling generally weak.  Past Medical History  Diagnosis Date  . Multiple sclerosis   . Hypertension   . HIV (human immunodeficiency virus infection)   . Myocardial infarction   . Leukopenia     Low CD4  . Cryptococcal meningitis   . Elevated liver enzymes   . High triglycerides     Past Surgical History  Procedure Laterality Date  . Cholecystectomy  06/10/2011    Procedure: LAPAROSCOPIC CHOLECYSTECTOMY;  Surgeon: Fabio Bering, MD;  Location: [facility];  Service: General;  Laterality: N/A;  . Cholecystectomy  06/10/2011    Procedure: LAPAROSCOPIC CHOLECYSTECTOMY;  Surgeon: Fabio Bering, MD;  Location: [facility];  Service: General;  Laterality: N/A;  . Neck surgery    . Esophagogastroduodenoscopy      Family History  Problem Relation Age of Onset  . Hypertension Mother   . Heart attack Mother   . Heart attack Father   . Cancer Sister     Breast  . Diabetes Sister   . Cancer Brother     Colon  . Diabetes Brother   . Cancer Brother     colon     History  Substance Use Topics  . Smoking status: Never Smoker   . Smokeless tobacco: Not on file  . Alcohol Use: No      Review of Systems  Gastrointestinal: Positive for diarrhea.  All other systems reviewed and are negative.    Allergies  Review of patient's allergies indicates no known allergies.  Home Medications   Current Outpatient Rx  Name  Route  Sig  Dispense  Refill  . allopurinol (ZYLOPRIM) 300 MG tablet   Oral   Take 300 mg by mouth daily.           Marland Kitchen amitriptyline (ELAVIL) 50 MG tablet   Oral   Take 50 mg by mouth at bedtime.           Marland Kitchen amLODipine (NORVASC) 5 MG tablet   Oral   Take 5 mg by mouth daily. 1/2 tablet daily         . aspirin EC 81 MG tablet   Oral   Take 81 mg by mouth daily.           . carbamazepine (TEGRETOL) 100 MG chewable tablet   Oral   Chew 100 mg by mouth 2 (two) times daily.           Marland Kitchen efavirenz-emtrictabine-tenofovir (ATRIPLA) 600-200-300 MG per tablet   Oral   Take 1 tablet  by mouth at bedtime.           . fish oil-omega-3 fatty acids 1000 MG capsule   Oral   Take 2 g by mouth daily.           . folic acid (FOLVITE) 400 MCG tablet   Oral   Take 400 mcg by mouth daily.           Marland Kitchen HYDROcodone-acetaminophen (NORCO/VICODIN) 5-325 MG per tablet   Oral   Take 1 tablet by mouth every 4 (four) hours as needed for pain (generic please).   30 tablet   1   . ibuprofen (ADVIL,MOTRIN) 200 MG tablet   Oral   Take 200 mg by mouth every 6 (six) hours as needed for pain.         Marland Kitchen interferon beta-1b (BETASERON) 0.3 MG injection   Subcutaneous   Inject 44 mg into the skin every Monday, Wednesday, and Friday.           Marland Kitchen lisinopril-hydrochlorothiazide (PRINZIDE,ZESTORETIC) 20-25 MG per tablet   Oral   Take 1 tablet by mouth daily.         . multivitamin-iron-minerals-folic acid (CENTRUM) chewable tablet   Oral   Chew 1 tablet by mouth daily.           Marland Kitchen omeprazole (PRILOSEC) 20 MG capsule    Oral   Take 20 mg by mouth daily.           . pravastatin (PRAVACHOL) 20 MG tablet   Oral   Take 20 mg by mouth daily.           Marland Kitchen pyridoxine (B-6) 100 MG tablet   Oral   Take 100 mg by mouth daily.           . ritonavir (NORVIR) 100 MG capsule   Oral   Take 100 mg by mouth daily.         Marland Kitchen triamcinolone cream (KENALOG) 0.1 %   Topical   Apply 1 application topically 2 (two) times daily as needed. For itching            BP 129/71  Pulse 104  Temp(Src) 99 F (37.2 C) (Oral)  Resp 20  Ht 5\' 9"  (1.753 m)  Wt 185 lb (83.915 kg)  BMI 27.31 kg/m2  SpO2 100%  Physical Exam  Nursing note and vitals reviewed.  67 year old male, resting comfortably and in no acute distress. Vital signs are significant for tachycardia with heart rate 104. Oxygen saturation is 100%, which is normal. Head is normocephalic and atraumatic. PERRLA, EOMI. Oropharynx is clear. Neck is nontender and supple without adenopathy or JVD. Back is nontender and there is no CVA tenderness. Lungs are clear without rales, wheezes, or rhonchi. Chest is nontender. Heart has regular rate and rhythm without murmur. Abdomen is soft, flat, nontender without masses or hepatosplenomegaly and peristalsis is hypoactive. Extremities have no cyanosis or edema, full range of motion is present. Skin is warm and dry without rash. Neurologic: Mental status is normal, cranial nerves are intact, there are no motor or sensory deficits.  ED Course  Procedures (including critical care time)  Results for orders placed during the hospital encounter of 10/26/12  CBC WITH DIFFERENTIAL      Result Value Range   WBC 10.0  4.0 - 10.5 K/uL   RBC 4.03 (*) 4.22 - 5.81 MIL/uL   Hemoglobin 12.3 (*) 13.0 - 17.0 g/dL   HCT 09.8 (*) 11.9 - 14.7 %  MCV 90.6  78.0 - 100.0 fL   MCH 30.5  26.0 - 34.0 pg   MCHC 33.7  30.0 - 36.0 g/dL   RDW 13.2  44.0 - 10.2 %   Platelets 163  150 - 400 K/uL   Neutrophils Relative 82 (*) 43 - 77 %    Neutro Abs 8.3 (*) 1.7 - 7.7 K/uL   Lymphocytes Relative 10 (*) 12 - 46 %   Lymphs Abs 1.0  0.7 - 4.0 K/uL   Monocytes Relative 8  3 - 12 %   Monocytes Absolute 0.8  0.1 - 1.0 K/uL   Eosinophils Relative 0  0 - 5 %   Eosinophils Absolute 0.0  0.0 - 0.7 K/uL   Basophils Relative 0  0 - 1 %   Basophils Absolute 0.0  0.0 - 0.1 K/uL  COMPREHENSIVE METABOLIC PANEL      Result Value Range   Sodium 137  135 - 145 mEq/L   Potassium 3.4 (*) 3.5 - 5.1 mEq/L   Chloride 101  96 - 112 mEq/L   CO2 19  19 - 32 mEq/L   Glucose, Bld 115 (*) 70 - 99 mg/dL   BUN 26 (*) 6 - 23 mg/dL   Creatinine, Ser 7.25 (*) 0.50 - 1.35 mg/dL   Calcium 9.1  8.4 - 36.6 mg/dL   Total Protein 8.1  6.0 - 8.3 g/dL   Albumin 3.9  3.5 - 5.2 g/dL   AST 30  0 - 37 U/L   ALT 22  0 - 53 U/L   Alkaline Phosphatase 99  39 - 117 U/L   Total Bilirubin 0.3  0.3 - 1.2 mg/dL   GFR calc non Af Amer 36 (*) >90 mL/min   GFR calc Af Amer 42 (*) >90 mL/min  URINALYSIS, ROUTINE W REFLEX MICROSCOPIC      Result Value Range   Color, Urine YELLOW  YELLOW   APPearance CLEAR  CLEAR   Specific Gravity, Urine 1.025  1.005 - 1.030   pH 6.0  5.0 - 8.0   Glucose, UA NEGATIVE  NEGATIVE mg/dL   Hgb urine dipstick NEGATIVE  NEGATIVE   Bilirubin Urine NEGATIVE  NEGATIVE   Ketones, ur NEGATIVE  NEGATIVE mg/dL   Protein, ur NEGATIVE  NEGATIVE mg/dL   Urobilinogen, UA 0.2  0.0 - 1.0 mg/dL   Nitrite NEGATIVE  NEGATIVE   Leukocytes, UA NEGATIVE  NEGATIVE     1. Diarrhea   2. Renal insufficiency       MDM  Diarrhea which could be due 2 viral enteritis. Need to consider possibility of Clostridium difficile with recent course of antibiotics. He'll be given IV fluids and oral loperamide. If he does produce a stool sample, specimen will be sent to test for Clostridium difficile.  He feels much better after above noted treatment. At this point, I have elected not to start empiric therapy with metronidazole. He is instructed to treat diarrhea  symptomatically with loperamide. However, the diarrhea persists, we'll need to consider starting metronidazole. Note is made of rising creatinine. He is to followup with his PCP to recheck that.      Dione Booze, MD 10/26/12 [phone]

## 2012-10-27 ENCOUNTER — Ambulatory Visit (INDEPENDENT_AMBULATORY_CARE_PROVIDER_SITE_OTHER): Payer: Medicare Other | Admitting: Family Medicine

## 2012-10-27 ENCOUNTER — Encounter: Payer: Self-pay | Admitting: Family Medicine

## 2012-10-27 VITALS — BP 130/76 | Temp 103.0°F | Wt 181.0 lb

## 2012-10-27 DIAGNOSIS — R7402 Elevation of levels of lactic acid dehydrogenase (LDH): Secondary | ICD-10-CM

## 2012-10-27 DIAGNOSIS — R197 Diarrhea, unspecified: Secondary | ICD-10-CM

## 2012-10-27 DIAGNOSIS — R7401 Elevation of levels of liver transaminase levels: Secondary | ICD-10-CM

## 2012-10-27 LAB — POCT URINALYSIS DIPSTICK
Spec Grav, UA: 1.02
pH, UA: 5

## 2012-10-27 MED ORDER — METRONIDAZOLE 500 MG PO TABS: 500.0000 mg | ORAL_TABLET | Freq: Three times a day (TID) | ORAL | Status: DC

## 2012-10-27 NOTE — Progress Notes (Signed)
  Subjective:    Patient ID: Kerry Solis, male    DOB: [birth year], 67 y.o.   MRN: [id number]  Diarrhea  This is a new problem. The current episode started in the past 7 days. The problem occurs more than 10 times per day. The problem has been unchanged. The stool consistency is described as mucous. The patient states that diarrhea awakens him from sleep. Associated symptoms include abdominal pain (vague), a fever and sweats. Pertinent negatives include no arthralgias, bloating, chills, coughing, headaches, myalgias, URI or vomiting. Nothing aggravates the symptoms. Risk factors include recent antibiotic use. He has tried anti-motility drug for the symptoms. The treatment provided mild relief. There is no history of bowel resection or inflammatory bowel disease. history of HIV      Review of Systems  Constitutional: Positive for fever and appetite change (decreased). Negative for chills.  HENT: Negative for congestion, neck pain and neck stiffness.   Respiratory: Negative for cough.   Cardiovascular: Negative for chest pain.  Gastrointestinal: Positive for abdominal pain (vague) and diarrhea. Negative for vomiting and bloating.  Musculoskeletal: Negative for myalgias, back pain and arthralgias.  Neurological: Negative for headaches.       Objective:   Physical Exam  Vitals reviewed. Constitutional: He appears well-developed and well-nourished.  HENT:  Head: Normocephalic and atraumatic.  Neck: Normal range of motion.  Cardiovascular: Normal rate.   Slight tachycardia  Pulmonary/Chest: Effort normal and breath sounds normal. No respiratory distress.  Abdominal:  Overall his abdominal exam soft all over no guarding rebound or tenderness  Musculoskeletal: He exhibits no edema.  Lymphadenopathy:    He has no cervical adenopathy.   I did do blood pressures laying and sitting there is no appreciable drop.       Assessment & Plan:  Diarrhea-I. Am concerned about the possibility of  C. Difficile. I encouraged him greatly to start on Flagyl 3 times daily for the next 7 days we'll recheck him tomorrow he will get lab work including CBC and metabolic 7. I told him if he gets worse he needs to go to emergency department.  He will hold on nonessential medications I do recommend that he take his HIV medicine he may hold on the other medicines for a couple days

## 2012-10-27 NOTE — Patient Instructions (Addendum)
Over the next several days to try to increase edema in the liquids you're taking in. I would recommend taking in Gatorade with walker. I would use 2 parts Gatorade for every one part water. For instance if you drink 4 ounces of Gatorade you need to mix in 2 ounces of water with it. This will help replenish your potassium sodium and sugars as well as rehydration you. Try to take enough liquids in so that your urine is a light yellow color. If you start running high fevers he needs to let us know.  Please do your lab work next week. Also if possible collections stool specimen. My nurse will give you the proper papers for this. As for warning signs if you start having bloody stools or high fevers or your urination decreases dramatically the next step would be going back to the emergency department for more fluids.  I am also starting you on a medication known as metronidazole. You will take one 3 times a day for the next week to help treat for the possibility of Clostridium difficile. If you are not back to her usual self by next week he ought to touch base with [place]. Followup again here in 2 weeks' time so we can recheck it.

## 2012-10-28 ENCOUNTER — Emergency Department (HOSPITAL_COMMUNITY)
Admission: EM | Admit: 2012-10-28 | Discharge: 2012-10-28 | Disposition: A | Discharge status: Home / Self Care | Payer: Medicare Other | Attending: Emergency Medicine | Admitting: Emergency Medicine

## 2012-10-28 ENCOUNTER — Ambulatory Visit: Payer: Medicare Other | Admitting: Family Medicine

## 2012-10-28 ENCOUNTER — Emergency Department (HOSPITAL_COMMUNITY): Payer: Medicare Other

## 2012-10-28 ENCOUNTER — Encounter (HOSPITAL_COMMUNITY): Payer: Self-pay

## 2012-10-28 DIAGNOSIS — Z21 Asymptomatic human immunodeficiency virus [HIV] infection status: Secondary | ICD-10-CM | POA: Insufficient documentation

## 2012-10-28 DIAGNOSIS — R630 Anorexia: Secondary | ICD-10-CM | POA: Diagnosis not present

## 2012-10-28 DIAGNOSIS — Z862 Personal history of diseases of the blood and blood-forming organs and certain disorders involving the immune mechanism: Secondary | ICD-10-CM | POA: Diagnosis not present

## 2012-10-28 DIAGNOSIS — R634 Abnormal weight loss: Secondary | ICD-10-CM | POA: Insufficient documentation

## 2012-10-28 DIAGNOSIS — I10 Essential (primary) hypertension: Secondary | ICD-10-CM | POA: Diagnosis not present

## 2012-10-28 DIAGNOSIS — R5381 Other malaise: Secondary | ICD-10-CM | POA: Insufficient documentation

## 2012-10-28 DIAGNOSIS — R531 Weakness: Secondary | ICD-10-CM

## 2012-10-28 DIAGNOSIS — Z79899 Other long term (current) drug therapy: Secondary | ICD-10-CM | POA: Insufficient documentation

## 2012-10-28 DIAGNOSIS — K7689 Other specified diseases of liver: Secondary | ICD-10-CM | POA: Diagnosis not present

## 2012-10-28 DIAGNOSIS — Z7982 Long term (current) use of aspirin: Secondary | ICD-10-CM | POA: Insufficient documentation

## 2012-10-28 DIAGNOSIS — Z8639 Personal history of other endocrine, nutritional and metabolic disease: Secondary | ICD-10-CM | POA: Insufficient documentation

## 2012-10-28 DIAGNOSIS — Z8669 Personal history of other diseases of the nervous system and sense organs: Secondary | ICD-10-CM | POA: Insufficient documentation

## 2012-10-28 DIAGNOSIS — D72819 Decreased white blood cell count, unspecified: Secondary | ICD-10-CM | POA: Insufficient documentation

## 2012-10-28 DIAGNOSIS — E876 Hypokalemia: Secondary | ICD-10-CM | POA: Insufficient documentation

## 2012-10-28 DIAGNOSIS — N200 Calculus of kidney: Secondary | ICD-10-CM | POA: Diagnosis not present

## 2012-10-28 DIAGNOSIS — R209 Unspecified disturbances of skin sensation: Secondary | ICD-10-CM | POA: Diagnosis not present

## 2012-10-28 DIAGNOSIS — I252 Old myocardial infarction: Secondary | ICD-10-CM | POA: Insufficient documentation

## 2012-10-28 DIAGNOSIS — E781 Pure hyperglyceridemia: Secondary | ICD-10-CM | POA: Insufficient documentation

## 2012-10-28 DIAGNOSIS — R5383 Other fatigue: Secondary | ICD-10-CM | POA: Diagnosis not present

## 2012-10-28 LAB — CBC WITH DIFFERENTIAL/PLATELET
Band Neutrophils: 0 % (ref 0–10)
Basophils Absolute: 0 10*3/uL (ref 0.0–0.1)
Basophils Relative: 0 % (ref 0–1)
Blasts: 0 %
Eosinophils Absolute: 0 10*3/uL (ref 0.0–0.7)
Eosinophils Relative: 0 % (ref 0–5)
HCT: 32.3 % — ABNORMAL LOW (ref 39.0–52.0)
Hemoglobin: 11.1 g/dL — ABNORMAL LOW (ref 13.0–17.0)
Lymphocytes Relative: 12 % (ref 12–46)
Lymphs Abs: 0.9 10*3/uL (ref 0.7–4.0)
MCH: 30.4 pg (ref 26.0–34.0)
MCHC: 34.4 g/dL (ref 30.0–36.0)
MCV: 88.5 fL (ref 78.0–100.0)
Metamyelocytes Relative: 0 %
Monocytes Absolute: 0.9 10*3/uL (ref 0.1–1.0)
Monocytes Relative: 12 % (ref 3–12)
Myelocytes: 0 %
Neutro Abs: 6 10*3/uL (ref 1.7–7.7)
Neutrophils Relative %: 76 % (ref 43–77)
Platelets: 179 10*3/uL (ref 150–400)
Promyelocytes Absolute: 0 %
RBC: 3.65 MIL/uL — ABNORMAL LOW (ref 4.22–5.81)
RDW: 14.2 % (ref 11.5–15.5)
WBC: 7.8 10*3/uL (ref 4.0–10.5)
nRBC: 0 /100 WBC

## 2012-10-28 LAB — BASIC METABOLIC PANEL
BUN: 40 mg/dL — ABNORMAL HIGH (ref 6–23)
BUN: 48 mg/dL — ABNORMAL HIGH (ref 6–23)
CO2: 19 mEq/L (ref 19–32)
CO2: 19 mEq/L (ref 19–32)
Calcium: 8.9 mg/dL (ref 8.4–10.5)
Calcium: 8.9 mg/dL (ref 8.4–10.5)
Chloride: 106 mEq/L (ref 96–112)
Chloride: 103 mEq/L (ref 96–112)
Creat: 1.86 mg/dL — ABNORMAL HIGH (ref 0.50–1.35)
Creatinine, Ser: 1.89 mg/dL — ABNORMAL HIGH (ref 0.50–1.35)
GFR calc Af Amer: 40 mL/min — ABNORMAL LOW (ref >90)
GFR calc non Af Amer: 35 mL/min — ABNORMAL LOW (ref >90)
Glucose, Bld: 103 mg/dL — ABNORMAL HIGH (ref 70–99)
Glucose, Bld: 111 mg/dL — ABNORMAL HIGH (ref 70–99)
Potassium: 3.6 mEq/L (ref 3.5–5.3)
Potassium: 2.6 mEq/L — CL (ref 3.5–5.1)
Sodium: 138 mEq/L (ref 135–145)
Sodium: 138 mEq/L (ref 135–145)

## 2012-10-28 LAB — URINALYSIS, ROUTINE W REFLEX MICROSCOPIC
Bilirubin Urine: NEGATIVE
Glucose, UA: NEGATIVE mg/dL
Ketones, ur: NEGATIVE mg/dL
Leukocytes, UA: NEGATIVE
Nitrite: NEGATIVE
Specific Gravity, Urine: 1.025 (ref 1.005–1.030)
Urobilinogen, UA: 0.2 mg/dL (ref 0.0–1.0)
pH: 5.5 (ref 5.0–8.0)

## 2012-10-28 LAB — TYPE AND SCREEN
ABO/RH(D): O POS
Antibody Screen: NEGATIVE

## 2012-10-28 LAB — HEPATIC FUNCTION PANEL
ALT: 26 U/L (ref 0–53)
ALT: 32 U/L (ref 0–53)
AST: 34 U/L (ref 0–37)
AST: 46 U/L — ABNORMAL HIGH (ref 0–37)
Albumin: 4.4 g/dL (ref 3.5–5.2)
Albumin: 3.6 g/dL (ref 3.5–5.2)
Alkaline Phosphatase: 82 U/L (ref 39–117)
Alkaline Phosphatase: 88 U/L (ref 39–117)
Bilirubin, Direct: 0.2 mg/dL (ref 0.0–0.3)
Bilirubin, Direct: 0.1 mg/dL (ref 0.0–0.3)
Indirect Bilirubin: 0.3 mg/dL (ref 0.0–0.9)
Indirect Bilirubin: 0.2 mg/dL — ABNORMAL LOW (ref 0.3–0.9)
Total Bilirubin: 0.5 mg/dL (ref 0.3–1.2)
Total Bilirubin: 0.3 mg/dL (ref 0.3–1.2)
Total Protein: 7.8 g/dL (ref 6.0–8.3)
Total Protein: 8.1 g/dL (ref 6.0–8.3)

## 2012-10-28 LAB — TROPONIN I: Troponin I: <0.3 ng/mL (ref <0.30)

## 2012-10-28 LAB — OCCULT BLOOD, POC DEVICE: Fecal Occult Bld: POSITIVE — AB

## 2012-10-28 LAB — URINE MICROSCOPIC-ADD ON

## 2012-10-28 MED ORDER — ONDANSETRON 8 MG PO TBDP: 8.0000 mg | ORAL_TABLET | Freq: Three times a day (TID) | ORAL | Status: DC | PRN

## 2012-10-28 MED ORDER — IBUPROFEN 800 MG PO TABS
800.0000 mg | ORAL_TABLET | Freq: Once | ORAL | Status: AC
  Administered 2012-10-28: 800 mg via ORAL
  Filled 2012-10-28: qty 1

## 2012-10-28 MED ORDER — POTASSIUM CHLORIDE CRYS ER 20 MEQ PO TBCR: 20.0000 meq | EXTENDED_RELEASE_TABLET | Freq: Two times a day (BID) | ORAL | Status: DC

## 2012-10-28 MED ORDER — POTASSIUM CHLORIDE 10 MEQ/100ML IV SOLN
10.0000 meq | Freq: Once | INTRAVENOUS | Status: AC
  Administered 2012-10-28: 10 meq via INTRAVENOUS
  Filled 2012-10-28: qty 100

## 2012-10-28 MED ORDER — IOHEXOL 300 MG/ML  SOLN
50.0000 mL | Freq: Once | INTRAMUSCULAR | Status: AC | PRN
  Administered 2012-10-28: 50 mL via ORAL

## 2012-10-28 MED ORDER — IOHEXOL 300 MG/ML  SOLN
80.0000 mL | Freq: Once | INTRAMUSCULAR | Status: AC | PRN
  Administered 2012-10-28: 80 mL via INTRAVENOUS

## 2012-10-28 MED ORDER — MORPHINE SULFATE 4 MG/ML IJ SOLN
6.0000 mg | Freq: Once | INTRAMUSCULAR | Status: AC
  Administered 2012-10-28: 6 mg via INTRAVENOUS
  Filled 2012-10-28: qty 1

## 2012-10-28 MED ORDER — MORPHINE SULFATE 2 MG/ML IJ SOLN
INTRAMUSCULAR | Status: AC
  Filled 2012-10-28: qty 1

## 2012-10-28 MED ORDER — SODIUM CHLORIDE 0.9 % IV SOLN
1000.0000 mL | Freq: Once | INTRAVENOUS | Status: AC
  Administered 2012-10-28: 1000 mL via INTRAVENOUS

## 2012-10-28 MED ORDER — POTASSIUM CHLORIDE CRYS ER 20 MEQ PO TBCR
40.0000 meq | EXTENDED_RELEASE_TABLET | Freq: Once | ORAL | Status: AC
  Administered 2012-10-28: 40 meq via ORAL
  Filled 2012-10-28: qty 2

## 2012-10-28 MED ORDER — SODIUM CHLORIDE 0.9 % IV SOLN
1000.0000 mL | INTRAVENOUS | Status: DC
  Administered 2012-10-28: 1000 mL via INTRAVENOUS

## 2012-10-28 MED ORDER — ONDANSETRON HCL 4 MG/2ML IJ SOLN
4.0000 mg | Freq: Once | INTRAMUSCULAR | Status: AC
  Administered 2012-10-28: 4 mg via INTRAVENOUS
  Filled 2012-10-28: qty 2

## 2012-10-28 NOTE — ED Notes (Addendum)
Per patient he started feeling weak and has had a loss of appetite since then. States that he has been constantly nauseous but has not vomited. Pt was seen here on Sunday, and was told by PCP to return if he did not start feeling better. Has had diarrhea several times a day since Friday. Last night patient states that he "passed out", does not know how long and does not know if he hit his head. Pt daughter-in-law states that before he started feeling bad on Friday he had removed 2 ticks from his right side.

## 2012-10-28 NOTE — ED Notes (Signed)
CRITICAL VALUE ALERT  Critical value received:  K+  Date of notification:  10/28/12  Time of notification:  1451  Critical value read back:yes  Nurse who received alert:  Lake Bells, RN  MD notified (1st page):  Dr Patria Mane  Time of first page:  [phone]

## 2012-10-28 NOTE — ED Notes (Signed)
Patient with no complaints at this time. Respirations even and unlabored. Skin warm/dry. Discharge instructions reviewed with patient at this time. Patient given opportunity to voice concerns/ask questions. IV removed per policy and band-aid applied to site. Patient discharged at this time and left Emergency Department with steady gait.  

## 2012-10-28 NOTE — ED Notes (Signed)
MD at bedside. 

## 2012-10-28 NOTE — ED Provider Notes (Signed)
History    This chart was scribed for Lyanne Co, MD by Marlyne Beards, ED Scribe. The patient was seen in room [facility]14/[facility]14. Patient's care was started at 1:05 PM.    CSN: [id number]  Arrival date & time 10/28/12  1138   First MD Initiated Contact with Patient 10/28/12 1305      Chief Complaint  Patient presents with  . Anorexia  . Fatigue     The history is provided by the patient and the spouse. No language interpreter was used.   HPI Comments: Kerry Solis is a 67 y.o. male with h/o HIV who presents to the Emergency Department complaining of moderate constant weakness with associated weight loss for the past 3 days. Pt states that he can not eat due to not having an appetite and has had lightheadedness with episodes of syncope. Pt states that he was walking around in the [place] and noticed he had a bunch of tics on him and shortly after his sx's started to occur. Pt states last night he started experiencing tingling in his lower extremities and that's all he remembered due to passing out. Pt states that he can not stand up in fear of hitting the floor due to weakness. Pt states that he weighed 210 lb about 3 weeks ago and now weighs 180 lbs. He states he has had some associated abdominal pain, nausea, diarrhea, headache, difficulty urinating, and black tarry stool. Pt denies fever, chills, cough, vomiting, SOB, and any other associated symptoms. Pt denies ever seeing a gastroenterologist. Pt saw an infectious disease doctor and was prescribed medications in which he has taken.  Pt's PCP is Dr. Gerda Diss  Past Medical History  Diagnosis Date  . Multiple sclerosis   . Hypertension   . HIV (human immunodeficiency virus infection)   . Myocardial infarction   . Leukopenia     Low CD4  . Cryptococcal meningitis   . Elevated liver enzymes   . High triglycerides     Past Surgical History  Procedure Laterality Date  . Cholecystectomy  06/10/2011    Procedure: LAPAROSCOPIC  CHOLECYSTECTOMY;  Surgeon: Fabio Bering, MD;  Location: [facility];  Service: General;  Laterality: N/A;  . Cholecystectomy  06/10/2011    Procedure: LAPAROSCOPIC CHOLECYSTECTOMY;  Surgeon: Fabio Bering, MD;  Location: [facility];  Service: General;  Laterality: N/A;  . Neck surgery    . Esophagogastroduodenoscopy      Family History  Problem Relation Age of Onset  . Hypertension Mother   . Heart attack Mother   . Heart attack Father   . Cancer Sister     Breast  . Diabetes Sister   . Cancer Brother     Colon  . Diabetes Brother   . Cancer Brother     colon    History  Substance Use Topics  . Smoking status: Never Smoker   . Smokeless tobacco: Not on file  . Alcohol Use: No      Review of Systems A complete 10 system review of systems was obtained and all systems are negative except as noted in the HPI and PMH.   Allergies  Review of patient's allergies indicates no known allergies.  Home Medications   Current Outpatient Rx  Name  Route  Sig  Dispense  Refill  . allopurinol (ZYLOPRIM) 300 MG tablet   Oral   Take 300 mg by mouth daily.           Marland Kitchen amitriptyline (ELAVIL) 50  MG tablet   Oral   Take 50 mg by mouth at bedtime.           Marland Kitchen amLODipine (NORVASC) 5 MG tablet   Oral   Take 5 mg by mouth daily. 1/2 tablet daily         . aspirin EC 81 MG tablet   Oral   Take 81 mg by mouth daily.           . carbamazepine (TEGRETOL) 100 MG chewable tablet   Oral   Chew 100 mg by mouth 2 (two) times daily.           Marland Kitchen efavirenz-emtrictabine-tenofovir (ATRIPLA) 600-200-300 MG per tablet   Oral   Take 1 tablet by mouth at bedtime.           . fish oil-omega-3 fatty acids 1000 MG capsule   Oral   Take 2 g by mouth daily.           . folic acid (FOLVITE) 400 MCG tablet   Oral   Take 400 mcg by mouth daily.           Marland Kitchen HYDROcodone-acetaminophen (NORCO/VICODIN) 5-325 MG per tablet   Oral   Take 1 tablet by mouth every 6 (six) hours as needed for  pain.         Marland Kitchen ibuprofen (ADVIL,MOTRIN) 200 MG tablet   Oral   Take 200 mg by mouth every 6 (six) hours as needed for pain.         Marland Kitchen interferon beta-1b (BETASERON) 0.3 MG injection   Subcutaneous   Inject 44 mg into the skin every Monday, Wednesday, and Friday.           Marland Kitchen lisinopril-hydrochlorothiazide (PRINZIDE,ZESTORETIC) 20-25 MG per tablet   Oral   Take 1 tablet by mouth daily.         . metroNIDAZOLE (FLAGYL) 500 MG tablet   Oral   Take 1 tablet (500 mg total) by mouth 3 (three) times daily.   21 tablet   0   . multivitamin-iron-minerals-folic acid (CENTRUM) chewable tablet   Oral   Chew 1 tablet by mouth daily.           Marland Kitchen omeprazole (PRILOSEC) 20 MG capsule   Oral   Take 20 mg by mouth daily.           . pravastatin (PRAVACHOL) 20 MG tablet   Oral   Take 20 mg by mouth daily.           Marland Kitchen pyridoxine (B-6) 100 MG tablet   Oral   Take 100 mg by mouth daily.           . ritonavir (NORVIR) 100 MG capsule   Oral   Take 100 mg by mouth daily.         Marland Kitchen triamcinolone cream (KENALOG) 0.1 %   Topical   Apply 1 application topically 2 (two) times daily as needed. For itching            BP 127/64  Pulse 82  Temp(Src) 98.5 F (36.9 C) (Oral)  Resp 19  SpO2 97%  Physical Exam  Nursing note and vitals reviewed. Constitutional: He is oriented to person, place, and time.  Pt is cachexia.    HENT:  Head: Normocephalic and atraumatic.  Eyes: EOM are normal.  Neck: Normal range of motion.  Cardiovascular: Normal rate, regular rhythm, normal heart sounds and intact distal pulses.   Pulmonary/Chest: Effort normal and breath  sounds normal. No respiratory distress.  Abdominal: Soft. He exhibits no distension. There is no tenderness.  Genitourinary: Rectum normal.  No hemorrhoids. No gross blood or brown stool.   Musculoskeletal: Normal range of motion.  Neurological: He is alert and oriented to person, place, and time.  Skin: Skin is warm and  dry.  Psychiatric: He has a normal mood and affect. Judgment normal.    ED Course  Procedures (including critical care time) DIAGNOSTIC STUDIES: Oxygen Saturation is 97% on room air, adequate by my interpretation.    COORDINATION OF CARE:  1:42 PM Discussed ED treatment with pt and pt agrees.    Labs Reviewed  CBC WITH DIFFERENTIAL - Abnormal; Notable for the following:    RBC 3.65 (*)    Hemoglobin 11.1 (*)    HCT 32.3 (*)    All other components within normal limits  BASIC METABOLIC PANEL - Abnormal; Notable for the following:    Potassium 2.6 (*)    Glucose, Bld 111 (*)    BUN 48 (*)    Creatinine, Ser 1.89 (*)    GFR calc non Af Amer 35 (*)    GFR calc Af Amer 40 (*)    All other components within normal limits  URINALYSIS, ROUTINE W REFLEX MICROSCOPIC - Abnormal; Notable for the following:    Hgb urine dipstick SMALL (*)    Protein, ur TRACE (*)    All other components within normal limits  HEPATIC FUNCTION PANEL - Abnormal; Notable for the following:    AST 46 (*)    Indirect Bilirubin 0.2 (*)    All other components within normal limits  OCCULT BLOOD, POC DEVICE - Abnormal; Notable for the following:    Fecal Occult Bld POSITIVE (*)    All other components within normal limits  TROPONIN I  URINE MICROSCOPIC-ADD ON  T-HELPER CELLS (CD4) COUNT  TYPE AND SCREEN   Dg Chest 2 View  10/28/2012  *RADIOLOGY REPORT*  Clinical Data: Fatigue.  CHEST - 2 VIEW  Comparison: Single view of the chest 02/11/2012.  Findings: Lungs are clear.  Heart size is normal.  No pneumothorax or pleural effusion.  IMPRESSION: No acute disease.   Original Report Authenticated By: Holley Dexter, M.D.    Ct Abdomen Pelvis W Contrast  10/28/2012  *RADIOLOGY REPORT*  Clinical Data: Anorexia, diarrhea, weakness  CT ABDOMEN AND PELVIS WITH CONTRAST  Technique:  Multidetector CT imaging of the abdomen and pelvis was performed following the standard protocol during bolus administration of  intravenous contrast.  Contrast: 50mL OMNIPAQUE IOHEXOL 300 MG/ML  SOLN, 80mL OMNIPAQUE IOHEXOL 300 MG/ML  SOLN  Comparison: 06/06/2011  Findings: Lung bases are unremarkable.  Sagittal images of the spine are unremarkable.  No destructive bony lesions are noted.  Mild hepatic fatty infiltration.  The patient is status post cholecystectomy.  Pancreas, spleen and adrenal glands are unremarkable.  No aortic aneurysm.  Kidneys show symmetrical enhancement.  No hydronephrosis or hydroureter.  Nonobstructive cortical calcification mid pole of the right kidney measures 3 mm.  Nonobstructive calcified calculus lower pole of the left kidney measures 6 mm.  No calcified ureteral calculi.  Probable cyst in lower pole of the left kidney posteriorly measures 1.2 cm stable from prior exam.  Descending and sigmoid colon diverticula are noted.  There is no evidence of acute diverticulitis.  No pericecal inflammation.  Normal appendix is clearly visualized axial image 57.  Small nonspecific lymph nodes are noted in the right lower quadrant mesentery the  largest measures 6 mm.  The terminal ileum is unremarkable.  There is nonspecific minimal enhancement of the wall in transverse colon and descending colon. Some intraluminal colonic fluid is noted.  Nonspecific mild colitis cannot be excluded.  Clinical correlation is necessary.  No distal colonic obstruction.  Prostate gland and seminal vesicles are unremarkable.  The urinary bladder is unremarkable.  Small bilateral inguinal scrotal canal hernia containing fat without evidence of acute complication.  IMPRESSION:  1.  Again noted mild hepatic fatty infiltration.  Status post cholecystectomy. 2.  No hydronephrosis or hydroureter.  Bilateral nonobstructive nephrolithiasis. 3.  No small bowel obstruction. 4.  Normal appendix. 5.  Nonspecific minimal enhancement of the wall and intraluminal fluid in the transverse colon and descending colon.  Nonspecific mild colitis cannot be  excluded.  Clinical correlation is necessary.   Original Report Authenticated By: Natasha Mead, M.D.    I personally reviewed the imaging tests through PACS system I reviewed available ER/hospitalization records through the EMR   1. Weakness   2. Hypokalemia   3. Weight loss, non-intentional       MDM  Patient's had diarrhea and nausea decreased oral intake.  He feels lightheaded likely from volume depletion.  Feels better after IV fluids.  Hemoglobin is stable.  Hemoccult is only trace heme positive.  Stools otherwise brown.  He has had a 30 pound weight loss over the past 3-4 weeks.  This is concerning.  Unit follow closely with his primary care physician regarding this.  Chest x-ray without abnormality.  CT scan demonstrates the possibility of colitis for which she started on Flagyl for as prescribed by his primary care physician.  The patient will need to followup closely with his PCP.  CD4 count has been sent out and will need to be followed up on.  The patient is hypokalemic in the emergency department.  He was route placed both orally and with IV.  He'll be sent home on 5 days of oral potassium replacement.  Home with nausea medicine.  Have encouraged patient to drink fluids at home and keep himself hydrated.  The patient does have renal insufficiency and his BUN and creatinine are consistent with his most recent ones in the past 2 days.  It is increased as compared to his BUN and creatinine 8 months ago.  His primary care physician is aware of this.  I will send a note to his primary care physician through Epic regarding the importance of close followup on his BUN and creatinine.      I personally performed the services described in this documentation, which was scribed in my presence. The recorded information has been reviewed and is accurate.      Lyanne Co, MD 10/28/12 [phone]

## 2012-10-29 LAB — T-HELPER CELLS (CD4) COUNT (NOT AT ARMC)
CD4 % Helper T Cell: 12 % — ABNORMAL LOW (ref 33–55)
CD4 T Cell Abs: 120 uL — ABNORMAL LOW (ref 400–2700)

## 2012-10-29 LAB — CLOSTRIDIUM DIFFICILE EIA: CDIFTX: NEGATIVE

## 2012-10-30 ENCOUNTER — Other Ambulatory Visit: Payer: Self-pay | Admitting: *Deleted

## 2012-10-30 DIAGNOSIS — E876 Hypokalemia: Secondary | ICD-10-CM

## 2012-10-31 ENCOUNTER — Ambulatory Visit (INDEPENDENT_AMBULATORY_CARE_PROVIDER_SITE_OTHER): Payer: Medicare Other | Admitting: Family Medicine

## 2012-10-31 ENCOUNTER — Encounter: Payer: Self-pay | Admitting: Family Medicine

## 2012-10-31 VITALS — BP 160/90 | Temp 98.5°F | Wt 177.0 lb

## 2012-10-31 DIAGNOSIS — R109 Unspecified abdominal pain: Secondary | ICD-10-CM | POA: Diagnosis not present

## 2012-10-31 DIAGNOSIS — E876 Hypokalemia: Secondary | ICD-10-CM

## 2012-10-31 DIAGNOSIS — R5381 Other malaise: Secondary | ICD-10-CM | POA: Diagnosis not present

## 2012-10-31 DIAGNOSIS — R197 Diarrhea, unspecified: Secondary | ICD-10-CM | POA: Diagnosis not present

## 2012-10-31 LAB — CBC WITH DIFFERENTIAL/PLATELET
Basophils Absolute: 0 10*3/uL (ref 0.0–0.1)
Basophils Relative: 0 % (ref 0–1)
Eosinophils Absolute: 0 10*3/uL (ref 0.0–0.7)
Eosinophils Relative: 0 % (ref 0–5)
HCT: 33.5 % — ABNORMAL LOW (ref 39.0–52.0)
Hemoglobin: 11.3 g/dL — ABNORMAL LOW (ref 13.0–17.0)
Lymphocytes Relative: 26 % (ref 12–46)
Lymphs Abs: 1.2 10*3/uL (ref 0.7–4.0)
MCH: 30.2 pg (ref 26.0–34.0)
MCHC: 33.7 g/dL (ref 30.0–36.0)
MCV: 89.6 fL (ref 78.0–100.0)
Monocytes Absolute: 0.6 10*3/uL (ref 0.1–1.0)
Monocytes Relative: 12 % (ref 3–12)
Neutro Abs: 2.7 10*3/uL (ref 1.7–7.7)
Neutrophils Relative %: 59 % (ref 43–77)
Platelets: 281 10*3/uL (ref 150–400)
RBC: 3.74 MIL/uL — ABNORMAL LOW (ref 4.22–5.81)
RDW: 14.8 % (ref 11.5–15.5)
WBC: 4.5 10*3/uL (ref 4.0–10.5)

## 2012-10-31 LAB — BASIC METABOLIC PANEL
BUN: 26 mg/dL — ABNORMAL HIGH (ref 6–23)
CO2: 20 mEq/L (ref 19–32)
Calcium: 9.3 mg/dL (ref 8.4–10.5)
Chloride: 110 mEq/L (ref 96–112)
Creat: 1.25 mg/dL (ref 0.50–1.35)
Glucose, Bld: 103 mg/dL — ABNORMAL HIGH (ref 70–99)
Potassium: 3.4 mEq/L — ABNORMAL LOW (ref 3.5–5.3)
Sodium: 141 mEq/L (ref 135–145)

## 2012-10-31 LAB — POCT URINALYSIS DIPSTICK
Protein, UA: 100
Spec Grav, UA: 1.02
pH, UA: 5

## 2012-10-31 MED ORDER — ONDANSETRON 8 MG PO TBDP: 8.0000 mg | ORAL_TABLET | Freq: Three times a day (TID) | ORAL | Status: DC | PRN

## 2012-10-31 NOTE — Patient Instructions (Signed)
Do not take lisinopril until instructed

## 2012-10-31 NOTE — Progress Notes (Signed)
  Subjective:    Patient ID: Kerry Solis, male    DOB: [birth year], 67 y.o.   MRN: [id number]  HPI There are multiple issues going on with this patient. Recently with gastroenteritis some vomiting and diarrhea. Frequent loose stools. Lower abdominal pain. Low-grade fever. Patient has HIV. Is on medication. In addition to all of this patient relates is getting over the vomiting Kerry Solis still having them nausea as well as diarrhea. Also with some mucousy stools. Feels weak and tired at times. Was seen in ER couple days ago his potassium was very low at that point. At times he gets up feeling weak and dizzy. Past medical history HIV, hypertension, reflux, hyperlipidemia Family history noncontributory Social does not smoke   Review of Systems  Constitutional: Positive for appetite change (Decreased). Negative for fever and activity change.  HENT: Negative for congestion, rhinorrhea and neck pain.   Eyes: Negative for discharge.  Respiratory: Negative for cough and wheezing.   Cardiovascular: Negative for chest pain.  Gastrointestinal: Positive for nausea and diarrhea (Watery). Negative for vomiting, abdominal pain and blood in stool.  Genitourinary: Negative for frequency and difficulty urinating.  Skin: Negative for rash.  Allergic/Immunologic: Negative for environmental allergies and food allergies.  Neurological: Negative for weakness and headaches.  Psychiatric/Behavioral: Negative for agitation.       Objective:   Physical Exam  Constitutional: He appears well-developed and well-nourished.  HENT:  Head: Normocephalic and atraumatic.  Mouth/Throat: Oropharynx is clear and moist.  Eyes: EOM are normal. Pupils are equal, round, and reactive to light.  Neck: Normal range of motion. Neck supple. No thyromegaly present.  Cardiovascular: Normal rate, regular rhythm and normal heart sounds.   No murmur heard. Pulmonary/Chest: Effort normal and breath sounds normal. No respiratory  distress. He has no wheezes.  Abdominal: Soft. Bowel sounds are normal. He exhibits no distension and no mass. There is no tenderness.  Musculoskeletal: Normal range of motion. He exhibits no edema.  Lymphadenopathy:    He has no cervical adenopathy.  Neurological: He is alert. He exhibits normal muscle tone.  Skin: Skin is warm and dry. No erythema.  Psychiatric: He has a normal mood and affect. His behavior is normal. Judgment normal.          Assessment & Plan:  #1 low potassium-I. Am concerned about this I encouraged patient to get adequate amount of fluids in including potassium via diluted Gatorade. We will recheck lab work #2 malaise and fatigue I think is related to this recent illness if not doing better may need gastroenterology consult for them to do sigmoidoscopy with biopsies. #3 severe diarrhea hopefully will get better with medications  Flagyl is being taking currently. Stool culture ordered. C. Difficile toxin was negative. May need ova and parasite testing as well if persists #4 HIV-I. Spoke with his HIV doctor they will see him in the next few weeks for followup they think it is previous testing with them on last visit looked good so they don't recommend further testing currently. His low CVL count is related to his illness. #5 abdominal pain is improving fevers going away hopefully patient will turn the corner he will give [place] update early next week how he is doing

## 2012-11-01 NOTE — Progress Notes (Signed)
Pt had office visit on 10-31-2012

## 2012-11-03 ENCOUNTER — Ambulatory Visit: Payer: Medicare Other | Admitting: Family Medicine

## 2012-11-03 DIAGNOSIS — R197 Diarrhea, unspecified: Secondary | ICD-10-CM | POA: Diagnosis not present

## 2012-11-04 ENCOUNTER — Telehealth: Payer: Self-pay | Admitting: Family Medicine

## 2012-11-04 LAB — FECAL LACTOFERRIN, QUANT: Lactoferrin: POSITIVE

## 2012-11-04 NOTE — Telephone Encounter (Signed)
Patient called wanted you to know feeling a lot better and eating now.

## 2012-11-04 NOTE — Telephone Encounter (Signed)
Pt to be seen in office the 23 of May.

## 2012-11-04 NOTE — Telephone Encounter (Signed)
Good, gald he is doing better . He should follow up with his doctor at [facility] plus he should follow up here in 4 weeks to check his weight and ov with me

## 2012-11-05 DIAGNOSIS — F411 Generalized anxiety disorder: Secondary | ICD-10-CM | POA: Diagnosis not present

## 2012-11-05 DIAGNOSIS — F329 Major depressive disorder, single episode, unspecified: Secondary | ICD-10-CM | POA: Diagnosis not present

## 2012-11-05 DIAGNOSIS — B20 Human immunodeficiency virus [HIV] disease: Secondary | ICD-10-CM | POA: Diagnosis not present

## 2012-11-05 DIAGNOSIS — K089 Disorder of teeth and supporting structures, unspecified: Secondary | ICD-10-CM | POA: Diagnosis not present

## 2012-11-05 DIAGNOSIS — F341 Dysthymic disorder: Secondary | ICD-10-CM | POA: Diagnosis not present

## 2012-11-05 DIAGNOSIS — Z792 Long term (current) use of antibiotics: Secondary | ICD-10-CM | POA: Diagnosis not present

## 2012-11-05 DIAGNOSIS — A0472 Enterocolitis due to Clostridium difficile, not specified as recurrent: Secondary | ICD-10-CM | POA: Diagnosis not present

## 2012-11-05 DIAGNOSIS — I10 Essential (primary) hypertension: Secondary | ICD-10-CM | POA: Diagnosis not present

## 2012-11-05 DIAGNOSIS — G35 Multiple sclerosis: Secondary | ICD-10-CM | POA: Diagnosis not present

## 2012-11-05 DIAGNOSIS — Z21 Asymptomatic human immunodeficiency virus [HIV] infection status: Secondary | ICD-10-CM | POA: Diagnosis not present

## 2012-11-07 ENCOUNTER — Ambulatory Visit (INDEPENDENT_AMBULATORY_CARE_PROVIDER_SITE_OTHER): Payer: Medicare Other | Admitting: Family Medicine

## 2012-11-07 ENCOUNTER — Encounter: Payer: Self-pay | Admitting: Family Medicine

## 2012-11-07 VITALS — BP 140/84 | HR 70 | Wt 183.0 lb

## 2012-11-07 DIAGNOSIS — D649 Anemia, unspecified: Secondary | ICD-10-CM

## 2012-11-07 LAB — STOOL CULTURE

## 2012-11-07 MED ORDER — OMEPRAZOLE 20 MG PO CPDR: 20.0000 mg | DELAYED_RELEASE_CAPSULE | Freq: Every day | ORAL | Status: DC

## 2012-11-07 NOTE — Patient Instructions (Signed)
Do your blood work in 10 days  Heme cards please do  Follow up in 2-3 weeks  If bloody or black stools call right away

## 2012-11-07 NOTE — Progress Notes (Signed)
  Subjective:    Patient ID: Kerry Solis, male    DOB: December 27, 1945, 67 y.o.   MRN: [id number]  HPIPatient in today for a follow up. Patient concerned about some of his labwork. Patient feels better but still weak and tired. Patient lost last script that was given and doesn't remember the name of the new medicine he was suppose to start. Patient overall doing much better. Patient does state that his hematocrit was slightly low when he saw his HIV doctor. They were wondering if he might have a GI bleed. He had a colonoscopy back in 2010 he does state that when he is having all these abdominal symptoms his stool was black for a couple days but now is back to normal. He denies any other particular problems denies any gross rectal bleeding sweats chills or fever  PMH HIV family history noncontributory Review of Systems    Denies wheezing difficulty breathing vomiting diarrhea or abdominal pain. States he feels much improved try to bring his weight back up. Objective:   Physical Exam His lungs are clear hearts regular abdomen soft no guarding rebound tenderness extremities no edema skin warm dry       Assessment & Plan:  Diarrhea-resolved Low hematocrit with possible recent GI bleed, omeprazole 20 mg daily, CBC, TIBC, ferritin in 10 days, followup office visit in 2-3 weeks, Hemoccult cards x3, may need colonoscopy and possibly EGD. Followup as above warning signs discussed call us sooner if any problems

## 2012-11-11 DIAGNOSIS — D649 Anemia, unspecified: Secondary | ICD-10-CM | POA: Diagnosis not present

## 2012-11-11 LAB — CBC WITH DIFFERENTIAL/PLATELET
Basophils Absolute: 0 10*3/uL (ref 0.0–0.1)
Basophils Relative: 0 % (ref 0–1)
Eosinophils Absolute: 0 10*3/uL (ref 0.0–0.7)
Eosinophils Relative: 1 % (ref 0–5)
HCT: 28.8 % — ABNORMAL LOW (ref 39.0–52.0)
Hemoglobin: 9.7 g/dL — ABNORMAL LOW (ref 13.0–17.0)
Lymphocytes Relative: 42 % (ref 12–46)
Lymphs Abs: 1.5 10*3/uL (ref 0.7–4.0)
MCH: 29.1 pg (ref 26.0–34.0)
MCHC: 33.7 g/dL (ref 30.0–36.0)
MCV: 86.5 fL (ref 78.0–100.0)
Monocytes Absolute: 0.5 10*3/uL (ref 0.1–1.0)
Monocytes Relative: 14 % — ABNORMAL HIGH (ref 3–12)
Neutro Abs: 1.5 10*3/uL — ABNORMAL LOW (ref 1.7–7.7)
Neutrophils Relative %: 43 % (ref 43–77)
Platelets: 300 10*3/uL (ref 150–400)
RBC: 3.33 MIL/uL — ABNORMAL LOW (ref 4.22–5.81)
RDW: 16.1 % — ABNORMAL HIGH (ref 11.5–15.5)
WBC: 3.5 10*3/uL — ABNORMAL LOW (ref 4.0–10.5)

## 2012-11-11 LAB — FERRITIN: Ferritin: 114 ng/mL (ref 22–322)

## 2012-11-11 LAB — IRON AND TIBC
%SAT: 25 % (ref 20–55)
Iron: 69 ug/dL (ref 42–165)
TIBC: 281 ug/dL (ref 215–435)
UIBC: 212 ug/dL (ref 125–400)

## 2012-11-12 ENCOUNTER — Other Ambulatory Visit (INDEPENDENT_AMBULATORY_CARE_PROVIDER_SITE_OTHER): Payer: Medicare Other | Admitting: *Deleted

## 2012-11-12 DIAGNOSIS — D649 Anemia, unspecified: Secondary | ICD-10-CM | POA: Diagnosis not present

## 2012-11-12 LAB — POC HEMOCCULT BLD/STL (HOME/3-CARD/SCREEN)
Card #2 Fecal Occult Blod, POC: NEGATIVE
Card #3 Fecal Occult Blood, POC: NEGATIVE
Fecal Occult Blood, POC: NEGATIVE

## 2012-12-03 ENCOUNTER — Ambulatory Visit (INDEPENDENT_AMBULATORY_CARE_PROVIDER_SITE_OTHER): Payer: Medicare Other | Admitting: Family Medicine

## 2012-12-03 ENCOUNTER — Encounter: Payer: Self-pay | Admitting: Family Medicine

## 2012-12-03 VITALS — BP 146/86 | Wt 190.4 lb

## 2012-12-03 DIAGNOSIS — I10 Essential (primary) hypertension: Secondary | ICD-10-CM | POA: Diagnosis not present

## 2012-12-03 MED ORDER — LISINOPRIL 5 MG PO TABS: 5.0000 mg | ORAL_TABLET | Freq: Every day | ORAL | Status: DC

## 2012-12-03 MED ORDER — AMLODIPINE BESYLATE 5 MG PO TABS: ORAL_TABLET | ORAL | Status: DC

## 2012-12-03 NOTE — Progress Notes (Signed)
  Subjective:    Patient ID: Kerry Solis, male    DOB: Jul 20, 1945, 67 y.o.   MRN: [id number]  HPI patient in today having recent illness lost weight diarrhea febrile illness actually doing much better now able to keep things down no vomiting or diarrhea wants to know whether not to reinitiate some of his medicines which we stopped when he was sick his weight is now back a past medical history hypertension hyperlipidemia HIV family history noncontributory ROS see per above denies chest tightness pressure pain shortness of breath    Review of Systems     Objective:   Physical Exam Neck no masses throat is normal lungs are clear no crackles heart is regular abdomen soft extremities no edema skin warm dry blood pressure slightly elevated on recheck       Assessment & Plan:  HTN-continue amlodipine 5 mg daily add lisinopril 5 mg daily. Patient was on lisinopril 20/25 that this would be too strong therefore lisinopril 5 mg daily followup patient again in 3-4 months patient will be careful not to gain too much weight.

## 2012-12-18 ENCOUNTER — Ambulatory Visit (INDEPENDENT_AMBULATORY_CARE_PROVIDER_SITE_OTHER): Payer: Medicare Other | Admitting: Family Medicine

## 2012-12-18 ENCOUNTER — Encounter: Payer: Self-pay | Admitting: Family Medicine

## 2012-12-18 VITALS — BP 154/92 | HR 70 | Wt 190.0 lb

## 2012-12-18 DIAGNOSIS — M25569 Pain in unspecified knee: Secondary | ICD-10-CM

## 2012-12-18 DIAGNOSIS — G8929 Other chronic pain: Secondary | ICD-10-CM | POA: Diagnosis not present

## 2012-12-18 NOTE — Progress Notes (Signed)
  Subjective:    Patient ID: Kerry Solis, male    DOB: 1946/01/27, 67 y.o.   MRN: [id number]  Knee Pain  The incident occurred more than 1 week ago. There was no injury mechanism. The pain is present in the right knee. The quality of the pain is described as aching. The pain is at a severity of 4/10. The pain is moderate.   sometimes pain is much more severe. Particularly if he turns or steps up on something her out of the vehicle. At times knee will feel like it is suddenly giving away. Patient has not fell. Pain extremely sharp at those times. Pain is been going on now for nearly 2 months. Over-the-counter medicines and not helping. Pain radiates to hip.  Walking a fair amount   Review of Systems Some history of arthritis no major knee injury ROS otherwise negative    Objective:   Physical Exam Alert no acute distress. HEENT normal. Lungs clear. Heart regular in rhythm knee distinct medial joint line tenderness. Mild swelling. No major effusion. No joint laxity. Some guarding with motion. Impression knee pain likely medial meniscus. Advised patient plan x-ray will not the best information. Plan pain medication discussed. MRI of the knee. Local measures discussed. Further recommendations based results. WSL       Assessment & Plan:

## 2012-12-25 ENCOUNTER — Ambulatory Visit (HOSPITAL_COMMUNITY)
Admission: RE | Admit: 2012-12-25 | Discharge: 2012-12-25 | Disposition: A | Discharge status: Home / Self Care | Payer: Medicare Other | Source: Ambulatory Visit | Attending: Family Medicine | Admitting: Family Medicine

## 2012-12-25 ENCOUNTER — Encounter (HOSPITAL_COMMUNITY): Payer: Self-pay

## 2012-12-25 DIAGNOSIS — M25561 Pain in right knee: Secondary | ICD-10-CM

## 2012-12-25 DIAGNOSIS — G8929 Other chronic pain: Secondary | ICD-10-CM

## 2012-12-25 DIAGNOSIS — M25569 Pain in unspecified knee: Secondary | ICD-10-CM | POA: Insufficient documentation

## 2012-12-31 ENCOUNTER — Other Ambulatory Visit: Payer: Self-pay

## 2012-12-31 DIAGNOSIS — M25569 Pain in unspecified knee: Secondary | ICD-10-CM

## 2013-01-08 ENCOUNTER — Other Ambulatory Visit: Payer: Self-pay | Admitting: *Deleted

## 2013-01-08 MED ORDER — HYDROCODONE-ACETAMINOPHEN 5-325 MG PO TABS: 1.0000 | ORAL_TABLET | Freq: Four times a day (QID) | ORAL | Status: DC | PRN

## 2013-01-13 ENCOUNTER — Encounter: Payer: Self-pay | Admitting: Family Medicine

## 2013-01-13 ENCOUNTER — Ambulatory Visit (INDEPENDENT_AMBULATORY_CARE_PROVIDER_SITE_OTHER): Payer: Medicare Other | Admitting: Family Medicine

## 2013-01-13 VITALS — BP 142/100 | Temp 98.8°F | Wt 198.0 lb

## 2013-01-13 DIAGNOSIS — J019 Acute sinusitis, unspecified: Secondary | ICD-10-CM

## 2013-01-13 MED ORDER — AZITHROMYCIN 250 MG PO TABS: ORAL_TABLET | ORAL | Status: DC

## 2013-01-13 MED ORDER — ALBUTEROL SULFATE HFA 108 (90 BASE) MCG/ACT IN AERS: 2.0000 | INHALATION_SPRAY | Freq: Four times a day (QID) | RESPIRATORY_TRACT | Status: DC | PRN

## 2013-01-13 NOTE — Progress Notes (Signed)
  Subjective:    Patient ID: Kerry Solis, male    DOB: 03-05-1946, 67 y.o.   MRN: [id number]  Cough This is a new problem. The current episode started in the past 7 days. The problem has been gradually worsening. Associated symptoms include headaches and nasal congestion.    Wheezing some Some sweats, no chills No vomiting   Review of Systems  Respiratory: Positive for cough.   Neurological: Positive for headaches.       Objective:   Physical Exam  Nursing note and vitals reviewed. Constitutional: He appears well-developed.  HENT:  Head: Normocephalic.  Mouth/Throat: Oropharynx is clear and moist. No oropharyngeal exudate.  Neck: Normal range of motion.  Cardiovascular: Normal rate, regular rhythm and normal heart sounds.   No murmur heard. Pulmonary/Chest: Effort normal and breath sounds normal. He has no wheezes.  Lymphadenopathy:    He has no cervical adenopathy.  Neurological: He exhibits normal muscle tone.  Skin: Skin is warm and dry.          Assessment & Plan:  URI with probable acute bronchitis-Z-Pak as directed followup of ongoing troubles warning signs discussed I don't feel a chest x-ray lab work indicated currently

## 2013-01-14 ENCOUNTER — Ambulatory Visit (INDEPENDENT_AMBULATORY_CARE_PROVIDER_SITE_OTHER): Payer: Medicare Other | Admitting: Orthopedic Surgery

## 2013-01-14 ENCOUNTER — Encounter: Payer: Self-pay | Admitting: Orthopedic Surgery

## 2013-01-14 VITALS — BP 178/106 | Ht 69.0 in | Wt 199.0 lb

## 2013-01-14 DIAGNOSIS — M8430XA Stress fracture, unspecified site, initial encounter for fracture: Secondary | ICD-10-CM | POA: Diagnosis not present

## 2013-01-14 DIAGNOSIS — M25561 Pain in right knee: Secondary | ICD-10-CM

## 2013-01-14 DIAGNOSIS — G8929 Other chronic pain: Secondary | ICD-10-CM | POA: Diagnosis not present

## 2013-01-14 DIAGNOSIS — M25569 Pain in unspecified knee: Secondary | ICD-10-CM | POA: Diagnosis not present

## 2013-01-14 NOTE — Patient Instructions (Addendum)
WEAR BRACE, CONTINUE USING CANE

## 2013-01-14 NOTE — Progress Notes (Signed)
  Subjective:    Kerry Solis is a 67 y.o. male who presents with knee pain involving the right knee. Onset was gradual, starting about 7 months ago. Inciting event: none known. Current symptoms include: giving out, locking and pain located medial joint line . Pain is aggravated by any weight bearing, going up and down stairs, rising after sitting, standing and walking. Patient has had no prior knee problems. Evaluation to date: plain films: normal and MRI: abnormal probable insufficiency fracture . Treatment to date: none.  The following portions of the patient's history were reviewed and updated as appropriate: allergies, current medications, past family history, past medical history, past social history, past surgical history and problem list.   Review of Systems A comprehensive review of systems was negative. except: Weight loss, wheezing, cough, pain on inspiration snoring recent diagnosis of bronchitis. Diarrhea. Joint or muscle pain. Depression. Right hip pain radiating to right ankle with giving way of right leg. History of MS. We'll discuss with his neurologist on his next visit.   Objective:    BP 178/106  Ht 5\' 9"  (1.753 m)  Wt 199 lb (90.266 kg)  BMI 29.37 kg/m2 General appearance is normal, the patient is alert and oriented x3 with normal mood and affect. He can walk with a cane he has a limp  On inspection the left knee has no effusion no tenderness and full range of motion. Collateral and cruciate ligaments are stable muscle tone is normal skin is intact without laceration  The right knee is tender over the medial joint line but there is no effusion. Active and passive range of motion are normal and unrestricted. Collateral ligaments and cruciate ligaments are normal muscle tone is normal skin is intact without laceration reflexes at the knee are 0 equal. Ankles 0 equal. Sensation is normal. Straight leg raise is normal. Normal cardiovascular flow and warmth in the joints and  limbs.  X-rays are essentially normal with the MRI does show a medial insufficiency fracture no cruciate ligament or meniscal damage. Medial femoral condyle that is where the subchondral insufficiency fracture is noted and this matches his clinical exam  Assessment:    Right Medial insufficiency fracture the femur    Plan:    Hinged knee brace to unload the medial compartment x6 weeks with a cane followup 6 weeks repeat exam

## 2013-02-11 DIAGNOSIS — M25569 Pain in unspecified knee: Secondary | ICD-10-CM | POA: Diagnosis not present

## 2013-02-11 DIAGNOSIS — G35 Multiple sclerosis: Secondary | ICD-10-CM | POA: Diagnosis not present

## 2013-02-11 DIAGNOSIS — K922 Gastrointestinal hemorrhage, unspecified: Secondary | ICD-10-CM | POA: Diagnosis not present

## 2013-02-11 DIAGNOSIS — I10 Essential (primary) hypertension: Secondary | ICD-10-CM | POA: Diagnosis not present

## 2013-02-11 DIAGNOSIS — Z792 Long term (current) use of antibiotics: Secondary | ICD-10-CM | POA: Diagnosis not present

## 2013-02-11 DIAGNOSIS — B20 Human immunodeficiency virus [HIV] disease: Secondary | ICD-10-CM | POA: Diagnosis not present

## 2013-03-03 ENCOUNTER — Encounter: Payer: Self-pay | Admitting: Orthopedic Surgery

## 2013-03-03 ENCOUNTER — Ambulatory Visit (INDEPENDENT_AMBULATORY_CARE_PROVIDER_SITE_OTHER): Payer: Medicare Other | Admitting: Orthopedic Surgery

## 2013-03-03 VITALS — BP 171/102 | Ht 69.0 in | Wt 199.0 lb

## 2013-03-03 DIAGNOSIS — M8430XD Stress fracture, unspecified site, subsequent encounter for fracture with routine healing: Secondary | ICD-10-CM

## 2013-03-03 DIAGNOSIS — Z4789 Encounter for other orthopedic aftercare: Secondary | ICD-10-CM | POA: Diagnosis not present

## 2013-03-03 NOTE — Patient Instructions (Signed)
Continue brace  Stop using cane

## 2013-03-03 NOTE — Progress Notes (Signed)
  Subjective:    Kerry Solis is a 67 y.o. male followup visit status post treatment for insufficiency fracture right knee medial femoral condyle documented by MRI  Patient reports improved symptoms with bracing and cane. Pain has actually been used only occasionally and he is happy with the brace no catching locking or giving way  Objective:    BP 171/102  Ht 5\' 9"  (1.753 m)  Wt 199 lb (90.266 kg)  BMI 29.37 kg/m2 Right knee full range of motion no effusion no tenderness knee stable motor exam normal skin intact negative rate Murray sign normal sensation         Assessment:  Resolution of right knee pain and insufficiency fracture Plan:   Recommend continue brace as needed followup as needed

## 2013-03-09 DIAGNOSIS — G35 Multiple sclerosis: Secondary | ICD-10-CM | POA: Diagnosis not present

## 2013-03-11 ENCOUNTER — Ambulatory Visit (INDEPENDENT_AMBULATORY_CARE_PROVIDER_SITE_OTHER): Payer: Medicare Other | Admitting: Family Medicine

## 2013-03-11 ENCOUNTER — Encounter: Payer: Self-pay | Admitting: Family Medicine

## 2013-03-11 VITALS — BP 168/90 | Ht 69.0 in | Wt 204.0 lb

## 2013-03-11 DIAGNOSIS — E785 Hyperlipidemia, unspecified: Secondary | ICD-10-CM | POA: Diagnosis not present

## 2013-03-11 DIAGNOSIS — Z23 Encounter for immunization: Secondary | ICD-10-CM

## 2013-03-11 DIAGNOSIS — R7309 Other abnormal glucose: Secondary | ICD-10-CM

## 2013-03-11 DIAGNOSIS — Z125 Encounter for screening for malignant neoplasm of prostate: Secondary | ICD-10-CM

## 2013-03-11 DIAGNOSIS — I10 Essential (primary) hypertension: Secondary | ICD-10-CM

## 2013-03-11 DIAGNOSIS — B20 Human immunodeficiency virus [HIV] disease: Secondary | ICD-10-CM | POA: Diagnosis not present

## 2013-03-11 DIAGNOSIS — M899 Disorder of bone, unspecified: Secondary | ICD-10-CM

## 2013-03-11 DIAGNOSIS — Z862 Personal history of diseases of the blood and blood-forming organs and certain disorders involving the immune mechanism: Secondary | ICD-10-CM | POA: Diagnosis not present

## 2013-03-11 DIAGNOSIS — R739 Hyperglycemia, unspecified: Secondary | ICD-10-CM

## 2013-03-11 DIAGNOSIS — M8580 Other specified disorders of bone density and structure, unspecified site: Secondary | ICD-10-CM

## 2013-03-11 DIAGNOSIS — E782 Mixed hyperlipidemia: Secondary | ICD-10-CM | POA: Insufficient documentation

## 2013-03-11 DIAGNOSIS — M8430XS Stress fracture, unspecified site, sequela: Secondary | ICD-10-CM

## 2013-03-11 MED ORDER — HYDROCODONE-ACETAMINOPHEN 5-325 MG PO TABS: 1.0000 | ORAL_TABLET | Freq: Four times a day (QID) | ORAL | Status: DC | PRN

## 2013-03-11 NOTE — Progress Notes (Signed)
  Subjective:    Patient ID: BROXTON BROADY, male    DOB: [birth year], 67 y.o.   MRN: [id number]  HPI Patient is here today for regular check up. Patient comes in today has multiple different things going on. He relates he sees his neurologist on a regular basis for his MS. He is been on steroids multiple different times. He was recently treated with a orthopedist for a stress fracture. Patient also has HIV he is followed by his infectious disease doctor and do things been under good control. He does have some history of elevated liver enzymes obesity tries to keep his weight in check. Patient also has history hypertension. Recent history of anemia secondary to gastric bleed he denies any signs of a gastric bleed currently. Patient also has intermittent orthopedic pains that he uses hydrocodone for but does not use it frequently. He states he has no concerns.   Past medical history family history reviewed.  Review of Systems See above.    Objective:   Physical Exam Thanks lungs are clear. Heart is regular. Abdomen soft. Extremities no edema. Skin warm dry. Neurologic grossly normal.       Assessment & Plan:  #1 HTN good control it was rechecked by myself 124/76 #2 history of elevated liver enzymes recheck liver profile await results #3 hyperlipidemia check lipid profile #4 history of hyperglycemia check glucose as well as hemoglobin A1c and urine micro-protein #5 orthopedic pain in the knees hydrocodone for occasional use 36 tablets, 5 mg/325 mg every 4-6 hours when necessary no refill

## 2013-03-18 ENCOUNTER — Other Ambulatory Visit: Payer: Self-pay | Admitting: Family Medicine

## 2013-03-18 ENCOUNTER — Ambulatory Visit (HOSPITAL_COMMUNITY)
Admission: RE | Admit: 2013-03-18 | Discharge: 2013-03-18 | Disposition: A | Discharge status: Home / Self Care | Payer: Medicare Other | Source: Ambulatory Visit | Attending: Family Medicine | Admitting: Family Medicine

## 2013-03-18 DIAGNOSIS — M8580 Other specified disorders of bone density and structure, unspecified site: Secondary | ICD-10-CM

## 2013-03-18 DIAGNOSIS — M899 Disorder of bone, unspecified: Secondary | ICD-10-CM | POA: Diagnosis not present

## 2013-03-18 DIAGNOSIS — M818 Other osteoporosis without current pathological fracture: Secondary | ICD-10-CM | POA: Insufficient documentation

## 2013-03-18 DIAGNOSIS — M810 Age-related osteoporosis without current pathological fracture: Secondary | ICD-10-CM | POA: Diagnosis not present

## 2013-03-19 LAB — VITAMIN D 25 HYDROXY (VIT D DEFICIENCY, FRACTURES): Vit D, 25-Hydroxy: 37 ng/mL (ref 30–89)

## 2013-03-25 ENCOUNTER — Other Ambulatory Visit: Payer: Self-pay | Admitting: Family Medicine

## 2013-03-25 NOTE — Progress Notes (Signed)
His bone density test shows osteopenia and osteoporosis he is already on extensive amount of medications. We will discuss further on his followup office visit

## 2013-03-27 ENCOUNTER — Other Ambulatory Visit: Payer: Self-pay

## 2013-03-27 MED ORDER — ALLOPURINOL 300 MG PO TABS: 300.0000 mg | ORAL_TABLET | Freq: Every day | ORAL | Status: DC

## 2013-04-09 ENCOUNTER — Ambulatory Visit (INDEPENDENT_AMBULATORY_CARE_PROVIDER_SITE_OTHER): Payer: Medicare Other | Admitting: Family Medicine

## 2013-04-09 ENCOUNTER — Encounter: Payer: Self-pay | Admitting: Family Medicine

## 2013-04-09 VITALS — BP 156/84 | Ht 69.0 in | Wt 206.8 lb

## 2013-04-09 DIAGNOSIS — I10 Essential (primary) hypertension: Secondary | ICD-10-CM

## 2013-04-09 MED ORDER — AMLODIPINE BESYLATE 10 MG PO TABS: ORAL_TABLET | ORAL | Status: DC

## 2013-04-09 NOTE — Progress Notes (Signed)
  Subjective:    Patient ID: Kerry Solis, male    DOB: 30-Apr-1946, 67 y.o.   MRN: [id number]  HPI Patient is here today for a check up. Patient relates he thinks everything is going well he brings his medicine and to review.  Wants to know if he should go back on the fish oil? He states he got his lab work although the electronic chart shows no evidence that was drawn other than a vitamin D level  He states everything is going well and has no concerns.  Denies chest tightness pressure pain shortness breath nausea vomiting diarrhea. Has history of HIV also history of MS Family history noncontributory social does not smoke Review of Systems    denies chest tightness pressure pain vomiting diarrhea Objective:   Physical Exam  Lungs are clear hearts regular pulse normal abdomen soft extremities no edema blood pressure is moderately elevated even on recheck      Assessment & Plan:  HTN-increase amlodipine 10 mg daily. Followup in about 6-8 weeks to recheck blood pressure. If any side effects or problems notify us. Continue lisinopril 5 mg daily. Low salt diet regular physical activity as well.

## 2013-04-09 NOTE — Patient Instructions (Addendum)
DASH Diet The DASH diet stands for "Dietary Approaches to Stop Hypertension." It is a healthy eating plan that has been shown to reduce high blood pressure (hypertension) in as little as 14 days, while also possibly providing other significant health benefits. These other health benefits include reducing the risk of breast cancer after menopause and reducing the risk of type 2 diabetes, heart disease, colon cancer, and stroke. Health benefits also include weight loss and slowing kidney failure in patients with chronic kidney disease.  DIET GUIDELINES  Limit salt (sodium). Your diet should contain less than 1500 mg of sodium daily.  Limit refined or processed carbohydrates. Your diet should include mostly whole grains. Desserts and added sugars should be used sparingly.  Include small amounts of heart-healthy fats. These types of fats include nuts, oils, and tub margarine. Limit saturated and trans fats. These fats have been shown to be harmful in the body. CHOOSING FOODS  The following food groups are based on a 2000 calorie diet. See your Registered Dietitian for individual calorie needs. Grains and Grain Products (6 to 8 servings daily)  Eat More Often: Whole-wheat bread, brown rice, whole-grain or wheat pasta, quinoa, popcorn without added fat or salt (air popped).  Eat Less Often: White bread, white pasta, white rice, cornbread. Vegetables (4 to 5 servings daily)  Eat More Often: Fresh, frozen, and canned vegetables. Vegetables may be raw, steamed, roasted, or grilled with a minimal amount of fat.  Eat Less Often/Avoid: Creamed or fried vegetables. Vegetables in a cheese sauce. Fruit (4 to 5 servings daily)  Eat More Often: All fresh, canned (in natural juice), or frozen fruits. Dried fruits without added sugar. One hundred percent fruit juice ( cup [237 mL] daily).  Eat Less Often: Dried fruits with added sugar. Canned fruit in light or heavy syrup. [employer], Fish, and Poultry (2  servings or less daily. One serving is 3 to 4 oz [85-114 g]).  Eat More Often: Ninety percent or leaner ground beef, tenderloin, sirloin. Round cuts of beef, chicken breast, [place] breast. All fish. Grill, bake, or broil your meat. Nothing should be fried.  Eat Less Often/Avoid: Fatty cuts of meat, [place], or chicken leg, thigh, or wing. Fried cuts of meat or fish. Dairy (2 to 3 servings)  Eat More Often: Low-fat or fat-free milk, low-fat plain or light yogurt, reduced-fat or part-skim cheese.  Eat Less Often/Avoid: Milk (whole, 2%).Whole milk yogurt. Full-fat cheeses. Nuts, Seeds, and Legumes (4 to 5 servings per week)  Eat More Often: All without added salt.  Eat Less Often/Avoid: Salted nuts and seeds, canned beans with added salt. Fats and Sweets (limited)  Eat More Often: Vegetable oils, tub margarines without trans fats, sugar-free gelatin. Mayonnaise and salad dressings.  Eat Less Often/Avoid: Coconut oils, palm oils, butter, stick margarine, cream, half and half, cookies, candy, pie. FOR MORE INFORMATION The Dash Diet Eating Plan: www.dashdiet.org Document Released: 05/24/2011 Document Revised: 08/27/2011 Document Reviewed: 05/24/2011 [facility] Patient Information 2014 [place], [place].   Amlodipine- increase to 10 mg daily. ( may take 2 daily of the 5mg  until you run out, the 10 mg was sent in )  Stop Tegretol ( carbamezipine )  If all goes well follow up in 6 weeks

## 2013-04-14 DIAGNOSIS — B20 Human immunodeficiency virus [HIV] disease: Secondary | ICD-10-CM | POA: Diagnosis not present

## 2013-04-14 DIAGNOSIS — E785 Hyperlipidemia, unspecified: Secondary | ICD-10-CM | POA: Diagnosis not present

## 2013-04-14 DIAGNOSIS — Z862 Personal history of diseases of the blood and blood-forming organs and certain disorders involving the immune mechanism: Secondary | ICD-10-CM | POA: Diagnosis not present

## 2013-04-14 DIAGNOSIS — Z125 Encounter for screening for malignant neoplasm of prostate: Secondary | ICD-10-CM | POA: Diagnosis not present

## 2013-04-14 DIAGNOSIS — R7309 Other abnormal glucose: Secondary | ICD-10-CM | POA: Diagnosis not present

## 2013-04-14 DIAGNOSIS — I10 Essential (primary) hypertension: Secondary | ICD-10-CM | POA: Diagnosis not present

## 2013-04-14 LAB — HEPATIC FUNCTION PANEL
ALT: 50 U/L (ref 0–53)
AST: 69 U/L — ABNORMAL HIGH (ref 0–37)
Albumin: 4.3 g/dL (ref 3.5–5.2)
Alkaline Phosphatase: 80 U/L (ref 39–117)
Bilirubin, Direct: 0.1 mg/dL (ref 0.0–0.3)
Indirect Bilirubin: 0.3 mg/dL (ref 0.0–0.9)
Total Bilirubin: 0.4 mg/dL (ref 0.3–1.2)
Total Protein: 7.5 g/dL (ref 6.0–8.3)

## 2013-04-14 LAB — BASIC METABOLIC PANEL
BUN: 11 mg/dL (ref 6–23)
CO2: 22 mEq/L (ref 19–32)
Calcium: 9.3 mg/dL (ref 8.4–10.5)
Chloride: 110 mEq/L (ref 96–112)
Creat: 1.05 mg/dL (ref 0.50–1.35)
Glucose, Bld: 95 mg/dL (ref 70–99)
Potassium: 4.2 mEq/L (ref 3.5–5.3)
Sodium: 142 mEq/L (ref 135–145)

## 2013-04-14 LAB — CBC
HCT: 35.2 % — ABNORMAL LOW (ref 39.0–52.0)
Hemoglobin: 12 g/dL — ABNORMAL LOW (ref 13.0–17.0)
MCH: 29.6 pg (ref 26.0–34.0)
MCHC: 34.1 g/dL (ref 30.0–36.0)
MCV: 86.9 fL (ref 78.0–100.0)
Platelets: 233 10*3/uL (ref 150–400)
RBC: 4.05 MIL/uL — ABNORMAL LOW (ref 4.22–5.81)
RDW: 14.7 % (ref 11.5–15.5)
WBC: 2.8 10*3/uL — ABNORMAL LOW (ref 4.0–10.5)

## 2013-04-14 LAB — MICROALBUMIN, URINE: Microalb, Ur: 6.55 mg/dL — ABNORMAL HIGH (ref 0.00–1.89)

## 2013-04-14 LAB — LIPID PANEL
Cholesterol: 212 mg/dL — ABNORMAL HIGH (ref 0–200)
HDL: 44 mg/dL (ref >39)
LDL Cholesterol: 142 mg/dL — ABNORMAL HIGH (ref 0–99)
Total CHOL/HDL Ratio: 4.8 Ratio
Triglycerides: 132 mg/dL (ref <150)
VLDL: 26 mg/dL (ref 0–40)

## 2013-04-14 LAB — PSA, MEDICARE: PSA: 1.89 ng/mL (ref <=4.00)

## 2013-04-14 LAB — HEMOGLOBIN A1C
Hgb A1c MFr Bld: 6.4 % — ABNORMAL HIGH (ref <5.7)
Mean Plasma Glucose: 137 mg/dL — ABNORMAL HIGH (ref <117)

## 2013-04-17 NOTE — Progress Notes (Signed)
Spoke with patient regarding:  Please tell the patient his blood work overall looks good but it does show prediabetes. Minimize starches and sugars. We will discuss this further when he follows up. Also his bad cholesterol is still elevated at 142 when he follows we will discuss increasing or potentially changing his cholesterol medicine. He should minimize sugars/breads/sugary drinks/potatoes. He may keep his appointment on December 4. If he wants to come sooner he can reschedule.      Patient verbalized understanding.

## 2013-04-21 ENCOUNTER — Telehealth: Payer: Self-pay | Admitting: Family Medicine

## 2013-04-21 NOTE — Telephone Encounter (Signed)
Error

## 2013-05-13 DIAGNOSIS — B20 Human immunodeficiency virus [HIV] disease: Secondary | ICD-10-CM | POA: Diagnosis not present

## 2013-05-13 DIAGNOSIS — Z792 Long term (current) use of antibiotics: Secondary | ICD-10-CM | POA: Diagnosis not present

## 2013-05-13 DIAGNOSIS — G35 Multiple sclerosis: Secondary | ICD-10-CM | POA: Diagnosis not present

## 2013-05-13 DIAGNOSIS — I10 Essential (primary) hypertension: Secondary | ICD-10-CM | POA: Diagnosis not present

## 2013-05-13 DIAGNOSIS — B451 Cerebral cryptococcosis: Secondary | ICD-10-CM | POA: Diagnosis not present

## 2013-05-21 ENCOUNTER — Encounter: Payer: Self-pay | Admitting: Family Medicine

## 2013-05-21 ENCOUNTER — Ambulatory Visit (INDEPENDENT_AMBULATORY_CARE_PROVIDER_SITE_OTHER): Payer: Medicare Other | Admitting: Family Medicine

## 2013-05-21 VITALS — BP 140/86 | Ht 69.0 in | Wt 206.0 lb

## 2013-05-21 DIAGNOSIS — E785 Hyperlipidemia, unspecified: Secondary | ICD-10-CM | POA: Diagnosis not present

## 2013-05-21 DIAGNOSIS — I10 Essential (primary) hypertension: Secondary | ICD-10-CM

## 2013-05-21 NOTE — Progress Notes (Signed)
   Subjective:    Patient ID: Kerry Solis, male    DOB: Mar 18, 1946, 67 y.o.   MRN: [id number]  HPI  Patient arrives for a follow upon blood pressure since increasing Norvasc. Patient states he is feeling much better since the medication adjustment.  PMH HTN. Hyperlipidemia. Patient does not smoke. Review of Systems    denies any chest tightness pressure pain shortness breath or swelling in the legs Objective:   Physical Exam His lungs are clear hearts regular pulse normal blood pressure good extremities no edema skin warm dry       Assessment & Plan:  HTN-much better control continue current measures  Hyperlipidemia paced H. the specialist is getting in the medicine for. Patient is concerned about this he stated he will call us with the name of the medicine once he gets it for [place] to review it before he would start on it. I told him that that would be fine.

## 2013-05-21 NOTE — Patient Instructions (Signed)
DASH Diet  The DASH diet stands for "Dietary Approaches to Stop Hypertension." It is a healthy eating plan that has been shown to reduce high blood pressure (hypertension) in as little as 14 days, while also possibly providing other significant health benefits. These other health benefits include reducing the risk of breast cancer after menopause and reducing the risk of type 2 diabetes, heart disease, colon cancer, and stroke. Health benefits also include weight loss and slowing kidney failure in patients with chronic kidney disease.   DIET GUIDELINES  · Limit salt (sodium). Your diet should contain less than 1500 mg of sodium daily.  · Limit refined or processed carbohydrates. Your diet should include mostly whole grains. Desserts and added sugars should be used sparingly.  · Include small amounts of heart-healthy fats. These types of fats include nuts, oils, and tub margarine. Limit saturated and trans fats. These fats have been shown to be harmful in the body.  CHOOSING FOODS   The following food groups are based on a 2000 calorie diet. See your Registered Dietitian for individual calorie needs.  Grains and Grain Products (6 to 8 servings daily)  · Eat More Often: Whole-wheat bread, brown rice, whole-grain or wheat pasta, quinoa, popcorn without added fat or salt (air popped).  · Eat Less Often: White bread, white pasta, white rice, cornbread.  Vegetables (4 to 5 servings daily)  · Eat More Often: Fresh, frozen, and canned vegetables. Vegetables may be raw, steamed, roasted, or grilled with a minimal amount of fat.  · Eat Less Often/Avoid: Creamed or fried vegetables. Vegetables in a cheese sauce.  Fruit (4 to 5 servings daily)  · Eat More Often: All fresh, canned (in natural juice), or frozen fruits. Dried fruits without added sugar. One hundred percent fruit juice (½ cup [237 mL] daily).  · Eat Less Often: Dried fruits with added sugar. Canned fruit in light or heavy syrup.  Lean Meats, Fish, and Poultry (2  servings or less daily. One serving is 3 to 4 oz [85-114 g]).  · Eat More Often: Ninety percent or leaner ground beef, tenderloin, sirloin. Round cuts of beef, chicken breast, turkey breast. All fish. Grill, bake, or broil your meat. Nothing should be fried.  · Eat Less Often/Avoid: Fatty cuts of meat, turkey, or chicken leg, thigh, or wing. Fried cuts of meat or fish.  Dairy (2 to 3 servings)  · Eat More Often: Low-fat or fat-free milk, low-fat plain or light yogurt, reduced-fat or part-skim cheese.  · Eat Less Often/Avoid: Milk (whole, 2%). Whole milk yogurt. Full-fat cheeses.  Nuts, Seeds, and Legumes (4 to 5 servings per week)  · Eat More Often: All without added salt.  · Eat Less Often/Avoid: Salted nuts and seeds, canned beans with added salt.  Fats and Sweets (limited)  · Eat More Often: Vegetable oils, tub margarines without trans fats, sugar-free gelatin. Mayonnaise and salad dressings.  · Eat Less Often/Avoid: Coconut oils, palm oils, butter, stick margarine, cream, half and half, cookies, candy, pie.  FOR MORE INFORMATION  The Dash Diet Eating Plan: www.dashdiet.org  Document Released: 05/24/2011 Document Revised: 08/27/2011 Document Reviewed: 05/24/2011  [employer]® Patient Information ©2014 [employer].

## 2013-08-03 ENCOUNTER — Telehealth: Payer: Self-pay | Admitting: Family Medicine

## 2013-08-03 MED ORDER — HYDROCODONE-ACETAMINOPHEN 5-325 MG PO TABS: 1.0000 | ORAL_TABLET | Freq: Four times a day (QID) | ORAL | Status: DC | PRN

## 2013-08-03 NOTE — Telephone Encounter (Signed)
Patient may have rx for #36 per [name].  Rx printed and left up front for patient pick up. Patient notified.

## 2013-08-03 NOTE — Telephone Encounter (Signed)
HYDROcodone-acetaminophen (NORCO/VICODIN) 5-325 MG per tablet  Needs a refill on this as soon as possible   Please call when ready to pick up

## 2013-08-20 ENCOUNTER — Ambulatory Visit (INDEPENDENT_AMBULATORY_CARE_PROVIDER_SITE_OTHER): Payer: Medicare Other | Admitting: Family Medicine

## 2013-08-20 ENCOUNTER — Encounter: Payer: Self-pay | Admitting: Family Medicine

## 2013-08-20 VITALS — BP 142/94 | Ht 68.0 in | Wt 210.0 lb

## 2013-08-20 DIAGNOSIS — E119 Type 2 diabetes mellitus without complications: Secondary | ICD-10-CM

## 2013-08-20 DIAGNOSIS — I10 Essential (primary) hypertension: Secondary | ICD-10-CM | POA: Diagnosis not present

## 2013-08-20 LAB — POCT GLYCOSYLATED HEMOGLOBIN (HGB A1C): Hemoglobin A1C: 6.3

## 2013-08-20 MED ORDER — CITALOPRAM HYDROBROMIDE 20 MG PO TABS: 20.0000 mg | ORAL_TABLET | Freq: Every day | ORAL | Status: DC
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
## 2013-08-20 NOTE — Patient Instructions (Signed)
Use 1/2 tablet daily  Recheck in 2 weeks  Call if problems

## 2013-08-31 DIAGNOSIS — G35 Multiple sclerosis: Secondary | ICD-10-CM | POA: Diagnosis not present

## 2013-09-03 ENCOUNTER — Encounter: Payer: Self-pay | Admitting: Family Medicine

## 2013-09-03 ENCOUNTER — Ambulatory Visit (INDEPENDENT_AMBULATORY_CARE_PROVIDER_SITE_OTHER): Payer: Medicare Other | Admitting: Family Medicine

## 2013-09-03 VITALS — BP 132/92 | Ht 68.0 in | Wt 208.0 lb

## 2013-09-03 DIAGNOSIS — I10 Essential (primary) hypertension: Secondary | ICD-10-CM

## 2013-09-03 NOTE — Progress Notes (Signed)
   Subjective:    Patient ID: Kerry Solis, male    DOB: 22-Apr-1946, 68 y.o.   MRN: [id number]  HPI Patient is here today for a recheck from 3/5 visit.  He said he is feeling better, however he is still in pain, all over his body, b/c he has cut back on his Tylenol intake.   No other concerns. He relates that he is actually doing much better compared where he was feels better energy better   Review of Systems Denies chest tightness pressure pain shortness breath nausea vomiting    Objective:   Physical Exam Lungs clear hearts regular neck no masses blood pressure slightly elevated       Assessment & Plan:  Blood pressure mildly elevated I rechecked it twice best reading was 144/76 I like to see a low bit better continue current medications watch salt in diet exercise  Depression-actually doing better on medication continue this medicine followup 3 months  Patient to check his blood pressure periodically as an outpatient

## 2013-09-09 DIAGNOSIS — F341 Dysthymic disorder: Secondary | ICD-10-CM | POA: Diagnosis not present

## 2013-09-09 DIAGNOSIS — Z792 Long term (current) use of antibiotics: Secondary | ICD-10-CM | POA: Diagnosis not present

## 2013-09-09 DIAGNOSIS — G35 Multiple sclerosis: Secondary | ICD-10-CM | POA: Diagnosis not present

## 2013-09-09 DIAGNOSIS — I10 Essential (primary) hypertension: Secondary | ICD-10-CM | POA: Diagnosis not present

## 2013-09-09 DIAGNOSIS — B20 Human immunodeficiency virus [HIV] disease: Secondary | ICD-10-CM | POA: Diagnosis not present

## 2013-10-19 ENCOUNTER — Telehealth: Payer: Self-pay | Admitting: Family Medicine

## 2013-10-19 MED ORDER — ALLOPURINOL 300 MG PO TABS: 300.0000 mg | ORAL_TABLET | Freq: Every day | ORAL | Status: DC

## 2013-10-19 MED ORDER — AMLODIPINE BESYLATE 10 MG PO TABS: ORAL_TABLET | ORAL | Status: DC

## 2013-10-19 NOTE — Telephone Encounter (Signed)
Done

## 2013-10-19 NOTE — Telephone Encounter (Signed)
Patient needs Rx for amlodipine besylate 10 mg and allopurinol 300 mg tab.

## 2013-11-04 ENCOUNTER — Other Ambulatory Visit: Payer: Self-pay | Admitting: Family Medicine

## 2013-11-14 ENCOUNTER — Emergency Department (HOSPITAL_COMMUNITY)
Admission: EM | Admit: 2013-11-14 | Discharge: 2013-11-14 | Disposition: A | Discharge status: Home / Self Care | Payer: Medicare Other | Attending: Emergency Medicine | Admitting: Emergency Medicine

## 2013-11-14 ENCOUNTER — Emergency Department (HOSPITAL_COMMUNITY): Payer: Medicare Other

## 2013-11-14 ENCOUNTER — Encounter (HOSPITAL_COMMUNITY): Payer: Self-pay | Admitting: Emergency Medicine

## 2013-11-14 DIAGNOSIS — Z862 Personal history of diseases of the blood and blood-forming organs and certain disorders involving the immune mechanism: Secondary | ICD-10-CM | POA: Insufficient documentation

## 2013-11-14 DIAGNOSIS — R071 Chest pain on breathing: Secondary | ICD-10-CM | POA: Diagnosis not present

## 2013-11-14 DIAGNOSIS — Z8619 Personal history of other infectious and parasitic diseases: Secondary | ICD-10-CM | POA: Insufficient documentation

## 2013-11-14 DIAGNOSIS — Z79899 Other long term (current) drug therapy: Secondary | ICD-10-CM | POA: Diagnosis not present

## 2013-11-14 DIAGNOSIS — G35 Multiple sclerosis: Secondary | ICD-10-CM | POA: Insufficient documentation

## 2013-11-14 DIAGNOSIS — I10 Essential (primary) hypertension: Secondary | ICD-10-CM | POA: Diagnosis not present

## 2013-11-14 DIAGNOSIS — S298XXA Other specified injuries of thorax, initial encounter: Secondary | ICD-10-CM | POA: Diagnosis not present

## 2013-11-14 DIAGNOSIS — Y9389 Activity, other specified: Secondary | ICD-10-CM | POA: Insufficient documentation

## 2013-11-14 DIAGNOSIS — I252 Old myocardial infarction: Secondary | ICD-10-CM | POA: Diagnosis not present

## 2013-11-14 DIAGNOSIS — Z8639 Personal history of other endocrine, nutritional and metabolic disease: Secondary | ICD-10-CM | POA: Insufficient documentation

## 2013-11-14 DIAGNOSIS — Z21 Asymptomatic human immunodeficiency virus [HIV] infection status: Secondary | ICD-10-CM | POA: Diagnosis not present

## 2013-11-14 DIAGNOSIS — R0789 Other chest pain: Secondary | ICD-10-CM

## 2013-11-14 DIAGNOSIS — Y92410 Unspecified street and highway as the place of occurrence of the external cause: Secondary | ICD-10-CM | POA: Insufficient documentation

## 2013-11-14 LAB — CBC WITH DIFFERENTIAL/PLATELET
Basophils Absolute: 0 10*3/uL (ref 0.0–0.1)
Basophils Relative: 0 % (ref 0–1)
Eosinophils Absolute: 0.1 10*3/uL (ref 0.0–0.7)
Eosinophils Relative: 1 % (ref 0–5)
HCT: 39.7 % (ref 39.0–52.0)
Hemoglobin: 13 g/dL (ref 13.0–17.0)
Lymphocytes Relative: 39 % (ref 12–46)
Lymphs Abs: 1.4 10*3/uL (ref 0.7–4.0)
MCH: 30 pg (ref 26.0–34.0)
MCHC: 32.7 g/dL (ref 30.0–36.0)
MCV: 91.5 fL (ref 78.0–100.0)
Monocytes Absolute: 0.5 10*3/uL (ref 0.1–1.0)
Monocytes Relative: 13 % — ABNORMAL HIGH (ref 3–12)
Neutro Abs: 1.7 10*3/uL (ref 1.7–7.7)
Neutrophils Relative %: 47 % (ref 43–77)
Platelets: 243 10*3/uL (ref 150–400)
RBC: 4.34 MIL/uL (ref 4.22–5.81)
RDW: 14.8 % (ref 11.5–15.5)
WBC: 3.6 10*3/uL — ABNORMAL LOW (ref 4.0–10.5)

## 2013-11-14 LAB — BASIC METABOLIC PANEL
BUN: 20 mg/dL (ref 6–23)
CO2: 20 mEq/L (ref 19–32)
Calcium: 9.4 mg/dL (ref 8.4–10.5)
Chloride: 105 mEq/L (ref 96–112)
Creatinine, Ser: 0.99 mg/dL (ref 0.50–1.35)
GFR calc Af Amer: >90 mL/min (ref >90)
GFR calc non Af Amer: 82 mL/min — ABNORMAL LOW (ref >90)
Glucose, Bld: 112 mg/dL — ABNORMAL HIGH (ref 70–99)
Potassium: 4.2 mEq/L (ref 3.7–5.3)
Sodium: 140 mEq/L (ref 137–147)

## 2013-11-14 LAB — CK: Total CK: 248 U/L — ABNORMAL HIGH (ref 7–232)

## 2013-11-14 MED ORDER — OXYCODONE-ACETAMINOPHEN 5-325 MG PO TABS: 1.0000 | ORAL_TABLET | ORAL | Status: DC | PRN

## 2013-11-14 MED ORDER — OXYCODONE-ACETAMINOPHEN 5-325 MG PO TABS
1.0000 | ORAL_TABLET | Freq: Once | ORAL | Status: AC
  Administered 2013-11-14: 1 via ORAL
  Filled 2013-11-14: qty 1
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
## 2013-11-16 ENCOUNTER — Telehealth: Payer: Self-pay | Admitting: Family Medicine
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
## 2013-12-04 ENCOUNTER — Encounter: Payer: Self-pay | Admitting: Family Medicine

## 2013-12-04 ENCOUNTER — Ambulatory Visit (INDEPENDENT_AMBULATORY_CARE_PROVIDER_SITE_OTHER): Payer: Medicare Other | Admitting: Family Medicine

## 2013-12-04 VITALS — BP 150/82 | Ht 68.0 in | Wt 209.0 lb

## 2013-12-04 DIAGNOSIS — R7309 Other abnormal glucose: Secondary | ICD-10-CM | POA: Diagnosis not present

## 2013-12-04 DIAGNOSIS — E785 Hyperlipidemia, unspecified: Secondary | ICD-10-CM | POA: Diagnosis not present

## 2013-12-04 DIAGNOSIS — R7303 Prediabetes: Secondary | ICD-10-CM | POA: Insufficient documentation

## 2013-12-04 DIAGNOSIS — I10 Essential (primary) hypertension: Secondary | ICD-10-CM | POA: Diagnosis not present

## 2013-12-04 MED ORDER — LISINOPRIL 10 MG PO TABS: 10.0000 mg | ORAL_TABLET | Freq: Every day | ORAL | Status: DC

## 2013-12-04 MED ORDER — OXYCODONE-ACETAMINOPHEN 5-325 MG PO TABS: 1.0000 | ORAL_TABLET | ORAL | Status: DC | PRN
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
## 2013-12-04 NOTE — Patient Instructions (Signed)
Increase lisinopril to 10 mg daily   Do your lab work

## 2013-12-28 DIAGNOSIS — R7309 Other abnormal glucose: Secondary | ICD-10-CM | POA: Diagnosis not present

## 2013-12-28 DIAGNOSIS — E785 Hyperlipidemia, unspecified: Secondary | ICD-10-CM | POA: Diagnosis not present

## 2013-12-28 DIAGNOSIS — I10 Essential (primary) hypertension: Secondary | ICD-10-CM | POA: Diagnosis not present

## 2013-12-28 LAB — LIPID PANEL
Cholesterol: 228 mg/dL — ABNORMAL HIGH (ref 0–200)
HDL: 48 mg/dL (ref >39)
LDL Cholesterol: 147 mg/dL — ABNORMAL HIGH (ref 0–99)
Total CHOL/HDL Ratio: 4.8 Ratio
Triglycerides: 165 mg/dL — ABNORMAL HIGH (ref <150)
VLDL: 33 mg/dL (ref 0–40)

## 2013-12-28 LAB — HEPATIC FUNCTION PANEL
ALT: 54 U/L — ABNORMAL HIGH (ref 0–53)
AST: 52 U/L — ABNORMAL HIGH (ref 0–37)
Albumin: 4.4 g/dL (ref 3.5–5.2)
Alkaline Phosphatase: 88 U/L (ref 39–117)
Bilirubin, Direct: 0.1 mg/dL (ref 0.0–0.3)
Indirect Bilirubin: 0.2 mg/dL (ref 0.2–1.2)
Total Bilirubin: 0.3 mg/dL (ref 0.2–1.2)
Total Protein: 7.6 g/dL (ref 6.0–8.3)

## 2013-12-28 LAB — HEMOGLOBIN A1C
Hgb A1c MFr Bld: 6.4 % — ABNORMAL HIGH (ref <5.7)
Mean Plasma Glucose: 137 mg/dL — ABNORMAL HIGH (ref <117)

## 2013-12-28 LAB — BASIC METABOLIC PANEL
BUN: 19 mg/dL (ref 6–23)
CO2: 27 mEq/L (ref 19–32)
Calcium: 9.6 mg/dL (ref 8.4–10.5)
Chloride: 107 mEq/L (ref 96–112)
Creat: 1.19 mg/dL (ref 0.50–1.35)
Glucose, Bld: 101 mg/dL — ABNORMAL HIGH (ref 70–99)
Potassium: 5.2 mEq/L (ref 3.5–5.3)
Sodium: 140 mEq/L (ref 135–145)

## 2014-01-13 ENCOUNTER — Other Ambulatory Visit: Payer: Self-pay | Admitting: Family Medicine

## 2014-01-25 ENCOUNTER — Ambulatory Visit (INDEPENDENT_AMBULATORY_CARE_PROVIDER_SITE_OTHER): Payer: Medicare Other | Admitting: Family Medicine

## 2014-01-25 ENCOUNTER — Encounter: Payer: Self-pay | Admitting: Family Medicine

## 2014-01-25 VITALS — BP 138/82 | Ht 68.0 in | Wt 208.0 lb

## 2014-01-25 DIAGNOSIS — I10 Essential (primary) hypertension: Secondary | ICD-10-CM | POA: Diagnosis not present

## 2014-01-25 DIAGNOSIS — Z23 Encounter for immunization: Secondary | ICD-10-CM

## 2014-01-25 DIAGNOSIS — E785 Hyperlipidemia, unspecified: Secondary | ICD-10-CM | POA: Diagnosis not present

## 2014-01-25 MED ORDER — PRAVASTATIN SODIUM 40 MG PO TABS: 40.0000 mg | ORAL_TABLET | Freq: Every evening | ORAL | Status: DC

## 2014-01-25 NOTE — Progress Notes (Signed)
   Subjective:    Patient ID: Kerry Solis, male    DOB: June 23, 1945, 68 y.o.   MRN: [id number]  Hypertension This is a chronic problem. The current episode started more than 1 year ago. Pertinent negatives include no chest pain. Risk factors for coronary artery disease include male gender. Treatments tried: norvasc and lisinopril. There are no compliance problems.    Patient has history hyperlipidemia no longer taking medication. He has also also history hypertension he tries to watch his diet. Also history HIV takes his medication follows up with specialist also MS this is stable he is taking his medications. Patient would like to discuss recent lab results.  Review of Systems  Constitutional: Negative for activity change, appetite change and fatigue.  HENT: Negative for congestion.   Respiratory: Negative for cough.   Cardiovascular: Negative for chest pain.  Gastrointestinal: Negative for abdominal pain.  Endocrine: Negative for polydipsia and polyphagia.  Neurological: Negative for weakness.  Psychiatric/Behavioral: Negative for confusion.       Objective:   Physical Exam  Vitals reviewed. Constitutional: He appears well-nourished. No distress.  Cardiovascular: Normal rate, regular rhythm and normal heart sounds.   No murmur heard. Pulmonary/Chest: Effort normal and breath sounds normal. No respiratory distress.  Musculoskeletal: He exhibits no edema.  Lymphadenopathy:    He has no cervical adenopathy.  Neurological: He is alert.  Psychiatric: His behavior is normal.          Assessment & Plan:  #1 hyperlipidemia he needs to get LDL below 100 restart medication recheck lab work again in 6-8 weeks #2 his overall orthopedic pain is doing better he is rarely using oxycodone  #3 HTN blood pressure under good control today.  #4 elevated liver enzymes this is related to medications and possibly fatty liver recheck this again in 6-8 weeks after starting statin

## 2014-01-26 ENCOUNTER — Encounter: Payer: Self-pay | Admitting: Gastroenterology

## 2014-02-08 ENCOUNTER — Encounter: Payer: Self-pay | Admitting: Family Medicine

## 2014-02-08 ENCOUNTER — Ambulatory Visit (INDEPENDENT_AMBULATORY_CARE_PROVIDER_SITE_OTHER): Payer: Medicare Other | Admitting: Family Medicine

## 2014-02-08 VITALS — BP 144/98 | Temp 99.2°F | Ht 68.5 in | Wt 207.0 lb

## 2014-02-08 DIAGNOSIS — J019 Acute sinusitis, unspecified: Secondary | ICD-10-CM | POA: Diagnosis not present

## 2014-02-08 MED ORDER — SULFAMETHOXAZOLE-TMP DS 800-160 MG PO TABS: 1.0000 | ORAL_TABLET | Freq: Two times a day (BID) | ORAL | Status: DC

## 2014-02-08 NOTE — Progress Notes (Signed)
   Subjective:    Patient ID: Kerry Solis, male    DOB: 1946/05/21, 68 y.o.   MRN: [id number]  Cough This is a new problem. The current episode started in the past 7 days. Associated symptoms include a fever, headaches, myalgias, nasal congestion, rhinorrhea and a sore throat. Pertinent negatives include no chest pain, ear pain or wheezing. Treatments tried: salt water. The treatment provided mild relief.   PMH HIV   Review of Systems  Constitutional: Positive for fever. Negative for activity change.  HENT: Positive for congestion, rhinorrhea and sore throat. Negative for ear pain.   Eyes: Negative for discharge.  Respiratory: Positive for cough. Negative for wheezing.   Cardiovascular: Negative for chest pain.  Musculoskeletal: Positive for myalgias.  Neurological: Positive for headaches.       Objective:   Physical Exam  Nursing note and vitals reviewed. Constitutional: He appears well-developed.  HENT:  Head: Normocephalic.  Mouth/Throat: Oropharynx is clear and moist. No oropharyngeal exudate.  Neck: Normal range of motion.  Cardiovascular: Normal rate, regular rhythm and normal heart sounds.   No murmur heard. Pulmonary/Chest: Effort normal and breath sounds normal. He has no wheezes.  Lymphadenopathy:    He has no cervical adenopathy.  Neurological: He exhibits normal muscle tone.  Skin: Skin is warm and dry.          Assessment & Plan:  Upper rest real this viral illness with underlying sinusitis with his HIV disease will go ahead and cover with antibiotics patient should gradually get better warning signs were discussed

## 2014-02-16 ENCOUNTER — Other Ambulatory Visit: Payer: Self-pay | Admitting: Family Medicine

## 2014-03-02 DIAGNOSIS — G35 Multiple sclerosis: Secondary | ICD-10-CM | POA: Diagnosis not present

## 2014-03-02 DIAGNOSIS — B20 Human immunodeficiency virus [HIV] disease: Secondary | ICD-10-CM | POA: Diagnosis not present

## 2014-03-02 DIAGNOSIS — Z6830 Body mass index (BMI) 30.0-30.9, adult: Secondary | ICD-10-CM | POA: Diagnosis not present

## 2014-03-02 DIAGNOSIS — E785 Hyperlipidemia, unspecified: Secondary | ICD-10-CM | POA: Diagnosis not present

## 2014-03-02 DIAGNOSIS — R5381 Other malaise: Secondary | ICD-10-CM | POA: Diagnosis not present

## 2014-03-02 DIAGNOSIS — I10 Essential (primary) hypertension: Secondary | ICD-10-CM | POA: Diagnosis not present

## 2014-03-03 ENCOUNTER — Telehealth: Payer: Self-pay | Admitting: Family Medicine

## 2014-03-03 MED ORDER — AMOXICILLIN-POT CLAVULANATE 875-125 MG PO TABS: 1.0000 | ORAL_TABLET | Freq: Two times a day (BID) | ORAL | Status: AC

## 2014-03-03 NOTE — Telephone Encounter (Signed)
Rx sent electronically to pharmacy. Patient notified. 

## 2014-03-03 NOTE — Telephone Encounter (Signed)
Augmentin 875 mg 1 twice a day for 10 days take with food if ongoing troubles may need additional medicine or may need to be rechecked. It is okay to take over-the-counter allergy tablets such as Claritin

## 2014-03-03 NOTE — Telephone Encounter (Signed)
Patient was prescribed Bactrim DS BID x 10 days on 02/08/14.

## 2014-03-03 NOTE — Telephone Encounter (Signed)
Patient just seen on 02/08/14 for sinusitis. He is still having chest congestion and head congestion, along with a sore throat.   [employer]

## 2014-03-11 DIAGNOSIS — E785 Hyperlipidemia, unspecified: Secondary | ICD-10-CM | POA: Diagnosis not present

## 2014-03-11 DIAGNOSIS — I10 Essential (primary) hypertension: Secondary | ICD-10-CM | POA: Diagnosis not present

## 2014-03-11 LAB — HEPATIC FUNCTION PANEL
ALT: 46 U/L (ref 0–53)
AST: 50 U/L — ABNORMAL HIGH (ref 0–37)
Albumin: 4.4 g/dL (ref 3.5–5.2)
Alkaline Phosphatase: 90 U/L (ref 39–117)
Bilirubin, Direct: 0.1 mg/dL (ref 0.0–0.3)
Indirect Bilirubin: 0.2 mg/dL (ref 0.2–1.2)
Total Bilirubin: 0.3 mg/dL (ref 0.2–1.2)
Total Protein: 7.9 g/dL (ref 6.0–8.3)

## 2014-03-11 LAB — LIPID PANEL
Cholesterol: 168 mg/dL (ref 0–200)
HDL: 41 mg/dL (ref >39)
LDL Cholesterol: 93 mg/dL (ref 0–99)
Total CHOL/HDL Ratio: 4.1 Ratio
Triglycerides: 170 mg/dL — ABNORMAL HIGH (ref <150)
VLDL: 34 mg/dL (ref 0–40)

## 2014-03-14 ENCOUNTER — Encounter: Payer: Self-pay | Admitting: Family Medicine

## 2014-03-16 ENCOUNTER — Ambulatory Visit: Payer: Medicare Other | Admitting: Family Medicine

## 2014-03-17 DIAGNOSIS — B20 Human immunodeficiency virus [HIV] disease: Secondary | ICD-10-CM | POA: Diagnosis not present

## 2014-03-17 DIAGNOSIS — I10 Essential (primary) hypertension: Secondary | ICD-10-CM | POA: Diagnosis not present

## 2014-03-17 DIAGNOSIS — Z792 Long term (current) use of antibiotics: Secondary | ICD-10-CM | POA: Diagnosis not present

## 2014-03-17 DIAGNOSIS — M1A00X0 Idiopathic chronic gout, unspecified site, without tophus (tophi): Secondary | ICD-10-CM | POA: Diagnosis not present

## 2014-03-17 DIAGNOSIS — F341 Dysthymic disorder: Secondary | ICD-10-CM | POA: Diagnosis not present

## 2014-03-17 DIAGNOSIS — G35 Multiple sclerosis: Secondary | ICD-10-CM | POA: Diagnosis not present

## 2014-03-17 DIAGNOSIS — K7689 Other specified diseases of liver: Secondary | ICD-10-CM | POA: Diagnosis not present

## 2014-03-30 LAB — HM DIABETES EYE EXAM

## 2014-04-20 ENCOUNTER — Other Ambulatory Visit: Payer: Self-pay | Admitting: *Deleted

## 2014-04-20 MED ORDER — LISINOPRIL 10 MG PO TABS: 10.0000 mg | ORAL_TABLET | Freq: Every day | ORAL | Status: DC

## 2014-04-26 ENCOUNTER — Other Ambulatory Visit: Payer: Self-pay | Admitting: Family Medicine

## 2014-04-30 ENCOUNTER — Encounter: Payer: Self-pay | Admitting: Family Medicine

## 2014-04-30 ENCOUNTER — Ambulatory Visit (INDEPENDENT_AMBULATORY_CARE_PROVIDER_SITE_OTHER): Payer: Medicare Other | Admitting: Family Medicine

## 2014-04-30 VITALS — BP 154/90 | Ht 68.5 in | Wt 207.0 lb

## 2014-04-30 DIAGNOSIS — M791 Myalgia: Secondary | ICD-10-CM

## 2014-04-30 DIAGNOSIS — R7309 Other abnormal glucose: Secondary | ICD-10-CM

## 2014-04-30 DIAGNOSIS — I10 Essential (primary) hypertension: Secondary | ICD-10-CM | POA: Diagnosis not present

## 2014-04-30 DIAGNOSIS — Z23 Encounter for immunization: Secondary | ICD-10-CM

## 2014-04-30 DIAGNOSIS — R7303 Prediabetes: Secondary | ICD-10-CM

## 2014-04-30 DIAGNOSIS — Z125 Encounter for screening for malignant neoplasm of prostate: Secondary | ICD-10-CM

## 2014-04-30 DIAGNOSIS — M609 Myositis, unspecified: Secondary | ICD-10-CM | POA: Diagnosis not present

## 2014-04-30 DIAGNOSIS — IMO0001 Reserved for inherently not codable concepts without codable children: Secondary | ICD-10-CM

## 2014-04-30 MED ORDER — LISINOPRIL-HYDROCHLOROTHIAZIDE 10-12.5 MG PO TABS: 1.0000 | ORAL_TABLET | Freq: Every day | ORAL | Status: DC

## 2014-04-30 MED ORDER — OXYCODONE-ACETAMINOPHEN 5-325 MG PO TABS: 1.0000 | ORAL_TABLET | ORAL | Status: DC | PRN

## 2014-04-30 NOTE — Progress Notes (Signed)
   Subjective:    Patient ID: Kerry Solis, male    DOB: 1945-09-04, 68 y.o.   MRN: [id number]  Hypertension This is a chronic problem. The current episode started more than 1 year ago. The problem has been gradually improving since onset. The problem is controlled. There are no associated agents to hypertension. There are no known risk factors for coronary artery disease. Treatments tried: amlodipine, lisinopril. The current treatment provides significant improvement. There are no compliance problems.   Patient needs a refill on his Oxycodone.   Patient states he has no other concerns at this time.   Patient states that he had had some problems with muscle aches discomforts especially with certain movements it is doing somewhat better now  Patient relates he uses oxycodone for chronic pain he does not use it frequently needs a refill it does help take the edge off his pain  He does state his try to watch diet and watch his sugars to keep his A1c under good control  Review of Systems     Objective:   Physical Exam Lungs clear heart regular pulse normal blood pressure elevated extremities no edema skin warm dry neck no masses       Assessment & Plan:  #1 HTN subpar control recommend increasing medication follow-up in a few weeks to recheck blood pressure  #2diabetes check A1c await the results  #3 recent myalgias muscle aches check creatinine kinase  #4 history of gout taking medication check uric acid level  #5 PSA for prostate cancer screening

## 2014-05-01 ENCOUNTER — Encounter: Payer: Self-pay | Admitting: Family Medicine

## 2014-05-01 LAB — CK: Total CK: 184 U/L (ref 7–232)

## 2014-05-01 LAB — HEMOGLOBIN A1C
Hgb A1c MFr Bld: 6.4 % — ABNORMAL HIGH (ref <5.7)
Mean Plasma Glucose: 137 mg/dL — ABNORMAL HIGH (ref <117)

## 2014-05-01 LAB — URIC ACID: Uric Acid, Serum: 4.9 mg/dL (ref 4.0–7.8)

## 2014-05-01 LAB — PSA: PSA: 2.39 ng/mL (ref <=4.00)

## 2014-05-25 ENCOUNTER — Encounter: Payer: Self-pay | Admitting: Family Medicine

## 2014-05-25 ENCOUNTER — Telehealth: Payer: Self-pay | Admitting: Family Medicine

## 2014-05-25 ENCOUNTER — Ambulatory Visit (INDEPENDENT_AMBULATORY_CARE_PROVIDER_SITE_OTHER): Payer: Medicare Other | Admitting: Family Medicine

## 2014-05-25 VITALS — BP 160/94 | Ht 68.5 in | Wt 205.0 lb

## 2014-05-25 DIAGNOSIS — I10 Essential (primary) hypertension: Secondary | ICD-10-CM | POA: Diagnosis not present

## 2014-05-25 NOTE — Progress Notes (Signed)
   Subjective:    Patient ID: Kerry Solis, male    DOB: 09-Jan-1946, 68 y.o.   MRN: [id number]  Hypertension This is a chronic problem. The current episode started more than 1 year ago. The problem has been gradually improving since onset. The problem is controlled. Pertinent negatives include no chest pain. There are no associated agents to hypertension. There are no known risk factors for coronary artery disease. Treatments tried: amlodipine. The current treatment provides significant improvement. There are no compliance problems.    Patient has no concerns at this time.    Review of Systems  Constitutional: Negative for activity change, appetite change and fatigue.  HENT: Negative for congestion.   Respiratory: Negative for cough.   Cardiovascular: Negative for chest pain.  Gastrointestinal: Negative for abdominal pain.  Endocrine: Negative for polydipsia and polyphagia.  Neurological: Negative for weakness.  Psychiatric/Behavioral: Negative for confusion.       Objective:   Physical Exam  Constitutional: He appears well-nourished. No distress.  Cardiovascular: Normal rate, regular rhythm and normal heart sounds.   No murmur heard. Pulmonary/Chest: Effort normal and breath sounds normal. No respiratory distress.  Musculoskeletal: He exhibits no edema.  Lymphadenopathy:    He has no cervical adenopathy.  Neurological: He is alert.  Psychiatric: His behavior is normal.  Vitals reviewed.         Assessment & Plan:  HTN not quite at goal we would like to see but this should get better watch diet minimize salt try to exercise try to keep weight and check follow-up again in a couple months time when he follows up if not at goal we will add additional medicines  Patient experiencing acute grief due to the tragic loss of his dog time spent discussing this with

## 2014-05-25 NOTE — Telephone Encounter (Signed)
See chart for his [facility] Recording Chart that he forgot to show you at this appt

## 2014-05-27 NOTE — Telephone Encounter (Signed)
Apparently patient may be having some glaucoma followed by eyes specialist

## 2014-07-01 ENCOUNTER — Encounter (HOSPITAL_COMMUNITY): Payer: Self-pay | Admitting: General Surgery

## 2014-07-15 DIAGNOSIS — I10 Essential (primary) hypertension: Secondary | ICD-10-CM | POA: Diagnosis not present

## 2014-07-15 DIAGNOSIS — E785 Hyperlipidemia, unspecified: Secondary | ICD-10-CM | POA: Diagnosis not present

## 2014-07-15 DIAGNOSIS — H4011X4 Primary open-angle glaucoma, indeterminate stage: Secondary | ICD-10-CM | POA: Diagnosis not present

## 2014-07-15 DIAGNOSIS — Z7982 Long term (current) use of aspirin: Secondary | ICD-10-CM | POA: Diagnosis not present

## 2014-07-15 DIAGNOSIS — H40001 Preglaucoma, unspecified, right eye: Secondary | ICD-10-CM | POA: Diagnosis not present

## 2014-07-17 DIAGNOSIS — H401124 Primary open-angle glaucoma, left eye, indeterminate stage: Secondary | ICD-10-CM | POA: Insufficient documentation

## 2014-07-19 ENCOUNTER — Emergency Department (HOSPITAL_COMMUNITY)
Admission: EM | Admit: 2014-07-19 | Discharge: 2014-07-19 | Disposition: A | Discharge status: Home / Self Care | Payer: Medicare Other | Attending: Emergency Medicine | Admitting: Emergency Medicine

## 2014-07-19 ENCOUNTER — Encounter (HOSPITAL_COMMUNITY): Payer: Self-pay | Admitting: *Deleted

## 2014-07-19 ENCOUNTER — Emergency Department (HOSPITAL_COMMUNITY): Payer: Medicare Other

## 2014-07-19 DIAGNOSIS — Z79899 Other long term (current) drug therapy: Secondary | ICD-10-CM | POA: Diagnosis not present

## 2014-07-19 DIAGNOSIS — S80212A Abrasion, left knee, initial encounter: Secondary | ICD-10-CM | POA: Insufficient documentation

## 2014-07-19 DIAGNOSIS — I252 Old myocardial infarction: Secondary | ICD-10-CM | POA: Diagnosis not present

## 2014-07-19 DIAGNOSIS — M25571 Pain in right ankle and joints of right foot: Secondary | ICD-10-CM | POA: Diagnosis not present

## 2014-07-19 DIAGNOSIS — S99911A Unspecified injury of right ankle, initial encounter: Secondary | ICD-10-CM | POA: Diagnosis not present

## 2014-07-19 DIAGNOSIS — E781 Pure hyperglyceridemia: Secondary | ICD-10-CM | POA: Diagnosis not present

## 2014-07-19 DIAGNOSIS — S80211A Abrasion, right knee, initial encounter: Secondary | ICD-10-CM | POA: Insufficient documentation

## 2014-07-19 DIAGNOSIS — Y998 Other external cause status: Secondary | ICD-10-CM | POA: Diagnosis not present

## 2014-07-19 DIAGNOSIS — W19XXXA Unspecified fall, initial encounter: Secondary | ICD-10-CM

## 2014-07-19 DIAGNOSIS — Y9289 Other specified places as the place of occurrence of the external cause: Secondary | ICD-10-CM | POA: Diagnosis not present

## 2014-07-19 DIAGNOSIS — W01198A Fall on same level from slipping, tripping and stumbling with subsequent striking against other object, initial encounter: Secondary | ICD-10-CM | POA: Diagnosis not present

## 2014-07-19 DIAGNOSIS — I10 Essential (primary) hypertension: Secondary | ICD-10-CM | POA: Insufficient documentation

## 2014-07-19 DIAGNOSIS — Y9389 Activity, other specified: Secondary | ICD-10-CM | POA: Insufficient documentation

## 2014-07-19 DIAGNOSIS — S8991XA Unspecified injury of right lower leg, initial encounter: Secondary | ICD-10-CM | POA: Diagnosis present

## 2014-07-19 DIAGNOSIS — M25561 Pain in right knee: Secondary | ICD-10-CM | POA: Diagnosis not present

## 2014-07-19 DIAGNOSIS — M25462 Effusion, left knee: Secondary | ICD-10-CM | POA: Diagnosis not present

## 2014-07-19 DIAGNOSIS — Z862 Personal history of diseases of the blood and blood-forming organs and certain disorders involving the immune mechanism: Secondary | ICD-10-CM | POA: Diagnosis not present

## 2014-07-19 DIAGNOSIS — H409 Unspecified glaucoma: Secondary | ICD-10-CM | POA: Diagnosis not present

## 2014-07-19 DIAGNOSIS — M25562 Pain in left knee: Secondary | ICD-10-CM

## 2014-07-19 DIAGNOSIS — Z8619 Personal history of other infectious and parasitic diseases: Secondary | ICD-10-CM | POA: Insufficient documentation

## 2014-07-19 HISTORY — DX: Unspecified glaucoma: H40.9

## 2014-07-19 NOTE — ED Notes (Signed)
Lt knee "gave way" and pt fell  Forward onto knees, rt  ankle , and both knees  Hurt.  No HI, alert,

## 2014-07-19 NOTE — ED Provider Notes (Signed)
CSN: [id number]     Arrival date & time 07/19/14  1817 History   First MD Initiated Contact with Patient 07/19/14 1841     Chief Complaint  Patient presents with  . Fall     (Consider location/radiation/quality/duration/timing/severity/associated sxs/prior Treatment) Patient is a 69 y.o. male presenting with fall. The history is provided by the patient.  Fall Pertinent negatives include no abdominal pain, no headaches and no shortness of breath.   patient status post fall while walking down his steps. It was just one step. Left knee gave out patient fell to his knees patient would complain of pain to both knees and right ankle. Patient able to drive here. Patient normally walks with a cane. Patient's tetanus is up-to-date.  Past Medical History  Diagnosis Date  . Multiple sclerosis   . Hypertension   . HIV (human immunodeficiency virus infection)   . Myocardial infarction   . Leukopenia     Low CD4  . Cryptococcal meningitis   . Elevated liver enzymes   . High triglycerides   . Glaucoma    Past Surgical History  Procedure Laterality Date  . Cholecystectomy  06/10/2011    Procedure: LAPAROSCOPIC CHOLECYSTECTOMY;  Surgeon: Donato Heinz, MD;  Location: [facility];  Service: General;  Laterality: N/A;  . Neck surgery    . Esophagogastroduodenoscopy     Family History  Problem Relation Age of Onset  . Hypertension Mother   . Heart attack Mother   . Heart attack Father   . Cancer Sister     Breast  . Diabetes Sister   . Cancer Brother     Colon  . Diabetes Brother   . Cancer Brother     colon   History  Substance Use Topics  . Smoking status: Never Smoker   . Smokeless tobacco: Not on file  . Alcohol Use: No    Review of Systems  Constitutional: Negative for fever.  HENT: Negative for congestion.   Eyes: Negative for visual disturbance.  Respiratory: Negative for shortness of breath.   Gastrointestinal: Negative for nausea, vomiting and abdominal pain.   Genitourinary: Negative for dysuria.  Musculoskeletal: Negative for back pain and neck pain.  Skin: Negative for rash.  Neurological: Negative for headaches.  Hematological: Does not bruise/bleed easily.  Psychiatric/Behavioral: Negative for confusion.      Allergies  Review of patient's allergies indicates no known allergies.  Home Medications   Prior to Admission medications   Medication Sig Start Date End Date Taking? Authorizing Provider  acetaminophen (TYLENOL) 500 MG tablet Take 1,000 mg by mouth 3 (three) times daily as needed for mild pain or moderate pain.    Historical Provider, MD  albuterol (PROVENTIL HFA;VENTOLIN HFA) 108 (90 BASE) MCG/ACT inhaler Inhale 2 puffs into the lungs every 6 (six) hours as needed for wheezing. 01/13/13   Kathyrn Drown, MD  allopurinol (ZYLOPRIM) 300 MG tablet TAKE (1) TABLET BY MOUTH ONCE DAILY FOR GOUT. 01/13/14   Mikey Kirschner, MD  amLODipine (NORVASC) 10 MG tablet TAKE (1) TABLET BY MOUTH DAILY FOR HIGH BLOOD PRESSURE. 02/16/14   Kathyrn Drown, MD  citalopram (CELEXA) 20 MG tablet TAKE 1 TABLET BY MOUTH ONCE DAILY. 04/26/14   Kathyrn Drown, MD  interferon beta-1b (BETASERON) 0.3 MG injection Inject 44 mg into the skin every Monday, Wednesday, and Friday.      Historical Provider, MD  lisinopril-hydrochlorothiazide (PRINZIDE,ZESTORETIC) 10-12.5 MG per tablet Take 1 tablet by mouth daily. 04/30/14  Kathyrn Drown, MD  oxyCODONE-acetaminophen (PERCOCET/ROXICET) 5-325 MG per tablet Take 1 tablet by mouth every 4 (four) hours as needed for severe pain. 04/30/14   Kathyrn Drown, MD  pravastatin (PRAVACHOL) 40 MG tablet Take 1 tablet (40 mg total) by mouth every evening. 01/25/14 01/25/15  Kathyrn Drown, MD   BP 147/76 mmHg  Pulse 102  Temp(Src) 97.9 F (36.6 C) (Oral)  Resp 16  Ht 5\' 9"  (1.753 m)  Wt 200 lb (90.719 kg)  BMI 29.52 kg/m2  SpO2 98% Physical Exam  Constitutional: He is oriented to person, place, and time. He appears  well-developed and well-nourished. No distress.  HENT:  Head: Normocephalic and atraumatic.  Mouth/Throat: Oropharynx is clear and moist.  Eyes: Conjunctivae and EOM are normal. Pupils are equal, round, and reactive to light.  Neck: Normal range of motion.  Cardiovascular: Normal rate, regular rhythm and normal heart sounds.   No murmur heard. Pulmonary/Chest: Effort normal and breath sounds normal. No respiratory distress.  Abdominal: Soft. Bowel sounds are normal. There is no tenderness.  Musculoskeletal: Normal range of motion. He exhibits tenderness.  Bilateral superficial abrasions to both knees. No effusion. Mild tenderness around the area abrasions. Mild tenderness to the lateral aspect of the right ankle but no sniffing swelling. Dorsalis pedis pulse and now both legs is 2+. Sensation intact.  Neurological: He is alert and oriented to person, place, and time. No cranial nerve deficit. He exhibits normal muscle tone. Coordination normal.  Skin: Skin is warm. No rash noted.  Nursing note and vitals reviewed.   ED Course  Procedures (including critical care time) Labs Review Labs Reviewed - No data to display  Imaging Review Dg Ankle Complete Right  07/19/2014   CLINICAL DATA:  Knee gave way.  Fall.  Right ankle pain.  EXAM: RIGHT ANKLE - COMPLETE 3+ VIEW  COMPARISON:  None.  FINDINGS: There is no evidence of fracture, dislocation, or joint effusion. There is no evidence of arthropathy or other focal bone abnormality. Soft tissues are unremarkable.  IMPRESSION: Negative.   Electronically Signed   By: Rolm Baptise M.D.   On: 07/19/2014 20:03   Dg Knee Complete 4 Views Left  07/19/2014   CLINICAL DATA:  Left knee gave way, fall.  Bilateral knee pain.  EXAM: LEFT KNEE - COMPLETE 4+ VIEW  COMPARISON:  None.  FINDINGS: Mild anterior soft tissue swelling. Small joint effusion. No fracture, subluxation or dislocation. Joint spaces are maintained.  IMPRESSION: Small joint effusion with anterior  soft tissue swelling. No underlying bony abnormality   Electronically Signed   By: Rolm Baptise M.D.   On: 07/19/2014 20:02   Dg Knee Complete 4 Views Right  07/19/2014   CLINICAL DATA:  Left knee weakness which resulted in fall.  EXAM: RIGHT KNEE - COMPLETE 4+ VIEW  COMPARISON:  None.  FINDINGS: No fracture of the proximal tibia or distal femur. Patella is normal. No joint effusion.  IMPRESSION: No fracture or dislocation.   Electronically Signed   By: Suzy Bouchard M.D.   On: 07/19/2014 20:03     EKG Interpretation None      MDM   Final diagnoses:  Fall, initial encounter  Knee pain, bilateral  Ankle pain, right    Patient status post fall. Did not injure his head. Complain of bilateral knee pain does have abrasions states that tetanus is up-to-date. Also complaining of some right ankle pain without significant swelling. Patient able to ambulate.  X-rays of both  knees without any bony injuries. Left knee with a little bit of fluid on it. Right knee normal right ankle normal. Patient we discharged home follow-up with his regular doctor he does have pain medication available at home.    Fredia Sorrow, MD 07/19/14 2102

## 2014-07-19 NOTE — Discharge Instructions (Signed)
As we discussed take it easy for a few days. Expect to be stiff and sore. Follow-up with your doctor if not improving. Take your pain medicine you're E have at home. No evidence of any fractures. So it is okay to walk.

## 2014-07-23 ENCOUNTER — Other Ambulatory Visit: Payer: Self-pay | Admitting: Family Medicine

## 2014-08-02 ENCOUNTER — Ambulatory Visit: Payer: Medicare Other | Admitting: Family Medicine

## 2014-08-10 ENCOUNTER — Ambulatory Visit (INDEPENDENT_AMBULATORY_CARE_PROVIDER_SITE_OTHER): Payer: Medicare Other | Admitting: Family Medicine

## 2014-08-10 ENCOUNTER — Encounter: Payer: Self-pay | Admitting: Family Medicine

## 2014-08-10 VITALS — BP 152/98 | Ht 68.5 in | Wt 202.0 lb

## 2014-08-10 DIAGNOSIS — R7401 Elevation of levels of liver transaminase levels: Secondary | ICD-10-CM

## 2014-08-10 DIAGNOSIS — I10 Essential (primary) hypertension: Secondary | ICD-10-CM

## 2014-08-10 DIAGNOSIS — R739 Hyperglycemia, unspecified: Secondary | ICD-10-CM | POA: Diagnosis not present

## 2014-08-10 DIAGNOSIS — R740 Nonspecific elevation of levels of transaminase and lactic acid dehydrogenase [LDH]: Secondary | ICD-10-CM

## 2014-08-10 DIAGNOSIS — R270 Ataxia, unspecified: Secondary | ICD-10-CM | POA: Diagnosis not present

## 2014-08-10 DIAGNOSIS — E785 Hyperlipidemia, unspecified: Secondary | ICD-10-CM | POA: Diagnosis not present

## 2014-08-10 DIAGNOSIS — R7309 Other abnormal glucose: Secondary | ICD-10-CM

## 2014-08-10 DIAGNOSIS — R7303 Prediabetes: Secondary | ICD-10-CM

## 2014-08-10 LAB — POCT GLYCOSYLATED HEMOGLOBIN (HGB A1C): Hemoglobin A1C: 6.7

## 2014-08-10 MED ORDER — OXYCODONE-ACETAMINOPHEN 5-325 MG PO TABS: 1.0000 | ORAL_TABLET | ORAL | Status: DC | PRN

## 2014-08-10 NOTE — Patient Instructions (Signed)
Diabetes Mellitus and Food It is important for you to manage your blood sugar (glucose) level. Your blood glucose level can be greatly affected by what you eat. Eating healthier foods in the appropriate amounts throughout the day at about the same time each day will help you control your blood glucose level. It can also help slow or prevent worsening of your diabetes mellitus. Healthy eating may even help you improve the level of your blood pressure and reach or maintain a healthy weight.  HOW CAN FOOD AFFECT ME? Carbohydrates Carbohydrates affect your blood glucose level more than any other type of food. Your dietitian will help you determine how many carbohydrates to eat at each meal and teach you how to count carbohydrates. Counting carbohydrates is important to keep your blood glucose at a healthy level, especially if you are using insulin or taking certain medicines for diabetes mellitus. Alcohol Alcohol can cause sudden decreases in blood glucose (hypoglycemia), especially if you use insulin or take certain medicines for diabetes mellitus. Hypoglycemia can be a life-threatening condition. Symptoms of hypoglycemia (sleepiness, dizziness, and disorientation) are similar to symptoms of having too much alcohol.  If your health care provider has given you approval to drink alcohol, do so in moderation and use the following guidelines:  Women should not have more than one drink per day, and men should not have more than two drinks per day. One drink is equal to:  12 oz of beer.  5 oz of wine.  1 oz of hard liquor.  Do not drink on an empty stomach.  Keep yourself hydrated. Have water, diet soda, or unsweetened iced tea.  Regular soda, juice, and other mixers might contain a lot of carbohydrates and should be counted. WHAT FOODS ARE NOT RECOMMENDED? As you make food choices, it is important to remember that all foods are not the same. Some foods have fewer nutrients per serving than other  foods, even though they might have the same number of calories or carbohydrates. It is difficult to get your body what it needs when you eat foods with fewer nutrients. Examples of foods that you should avoid that are high in calories and carbohydrates but low in nutrients include:  Trans fats (most processed foods list trans fats on the Nutrition Facts label).  Regular soda.  Juice.  Candy.  Sweets, such as cake, pie, doughnuts, and cookies.  Fried foods. WHAT FOODS CAN I EAT? Have nutrient-rich foods, which will nourish your body and keep you healthy. The food you should eat also will depend on several factors, including:  The calories you need.  The medicines you take.  Your weight.  Your blood glucose level.  Your blood pressure level.  Your cholesterol level. You also should eat a variety of foods, including:  Protein, such as meat, poultry, fish, tofu, nuts, and seeds (lean animal proteins are best).  Fruits.  Vegetables.  Dairy products, such as milk, cheese, and yogurt (low fat is best).  Breads, grains, pasta, cereal, rice, and beans.  Fats such as olive oil, trans fat-free margarine, canola oil, avocado, and olives. DOES EVERYONE WITH DIABETES MELLITUS HAVE THE SAME MEAL PLAN? Because every person with diabetes mellitus is different, there is not one meal plan that works for everyone. It is very important that you meet with a dietitian who will help you create a meal plan that is just right for you. Document Released: 03/01/2005 Document Revised: 06/09/2013 Document Reviewed: 05/01/2013 [employer] Patient Information 2015 [employer]. This   information is not intended to replace advice given to you by your health care provider. Make sure you discuss any questions you have with your health care provider.  

## 2014-08-10 NOTE — Progress Notes (Signed)
   Subjective:    Patient ID: Kerry Solis, male    DOB: [birth year], 69 y.o.   MRN: [id number]  Hypertension This is a chronic problem. The current episode started more than 1 year ago.   patient relates compliance with his medicine he does try to watch his diet.  He also has lipid issues. He takes his medication as directed. Concerns about weakness on left side of body. Started about 2 months ago. We talked at length about this he is using a cane but he is having some weakness on that side. He has had a couple times where the left leg is given way.  [place] on feb 1st. [facility]estic to ED. Still having bilateral knee pain and soreness in chest. He fell. Went to the ER. They evaluated him. Released him.  Needs refill on oxycodone 5/325. He denies using oxycodone excessively but needs a refill on it.  Prediabetes. A1C today 6.7 his A1c used to be under control now it's elevated. He states his diet could be better could be more physically active.   Review of Systems    he denies chest tightness pressure pain shortness of breath nausea vomiting diarrhea denies rectal bleeding Objective:   Physical Exam On physical exam his hearts regular pulse normal extremities no edema skin warm dry abdomen soft lungs are clear neck no masses Strength was tested. He has good 4+ strength bilateral. There is no appreciable differences for some left quadriceps weakness. This is a 4 over 5 rather than 4+. I observed him walking area he can walk with a cane fairly well turns fairly well his balance is good Romberg is negative. He cannot get out of a chair without assistance with his arms.       Assessment & Plan:  1. Hyperglycemia Has history of prediabetes A1c is now 6.7 he needs watch his diet closely exercise try to bring the spell if it is not down within 3 months time we will need to initiate other medications - POCT glycosylated hemoglobin (Hb A1C)  2. Essential hypertension Blood pressure overall doing good  check metabolic 7 await the results - Basic metabolic panel  3. Prediabetes See discussion above improve diet was recommended long with increase physical activity  4. Elevated transaminase level We will check liver profile. - Hepatic function panel  5. Ataxia Very important for this patient to work with strength and balance. I showed him techniques to do this. I believe his ataxia is related to weakness related to MS he is at risk of falling. He uses a cane. He was instructed if he ever had any serious fall immediately call 911 or go to ER. I asked him to practice getting out of a chair walking 402-3 minutes sitting back down getting back up again in repeating that at least 10 times at least 3 times a week. I'm hopeful that that will help build this up. I don't feel the patient has had a stroke. Certainly if he gets worse follow-up sooner otherwise follow-up 3 months.  6. Hyperlipidemia History hyperlipidemia check lab continue medication watch diet - Lipid panel

## 2014-08-12 DIAGNOSIS — H4011X4 Primary open-angle glaucoma, indeterminate stage: Secondary | ICD-10-CM | POA: Diagnosis not present

## 2014-08-12 DIAGNOSIS — H40001 Preglaucoma, unspecified, right eye: Secondary | ICD-10-CM | POA: Diagnosis not present

## 2014-08-16 ENCOUNTER — Other Ambulatory Visit: Payer: Self-pay | Admitting: Family Medicine

## 2014-08-27 ENCOUNTER — Other Ambulatory Visit: Payer: Self-pay | Admitting: Family Medicine

## 2014-09-22 DIAGNOSIS — I10 Essential (primary) hypertension: Secondary | ICD-10-CM | POA: Diagnosis not present

## 2014-09-22 DIAGNOSIS — K7581 Nonalcoholic steatohepatitis (NASH): Secondary | ICD-10-CM | POA: Diagnosis not present

## 2014-09-22 DIAGNOSIS — B20 Human immunodeficiency virus [HIV] disease: Secondary | ICD-10-CM | POA: Diagnosis not present

## 2014-09-22 DIAGNOSIS — Z792 Long term (current) use of antibiotics: Secondary | ICD-10-CM | POA: Diagnosis not present

## 2014-09-22 DIAGNOSIS — K088 Other specified disorders of teeth and supporting structures: Secondary | ICD-10-CM | POA: Diagnosis not present

## 2014-09-22 DIAGNOSIS — G35 Multiple sclerosis: Secondary | ICD-10-CM | POA: Diagnosis not present

## 2014-09-27 DIAGNOSIS — G35 Multiple sclerosis: Secondary | ICD-10-CM | POA: Diagnosis not present

## 2014-09-29 DIAGNOSIS — R7309 Other abnormal glucose: Secondary | ICD-10-CM | POA: Diagnosis not present

## 2014-09-29 DIAGNOSIS — E785 Hyperlipidemia, unspecified: Secondary | ICD-10-CM | POA: Diagnosis not present

## 2014-09-29 DIAGNOSIS — I10 Essential (primary) hypertension: Secondary | ICD-10-CM | POA: Diagnosis not present

## 2014-09-29 DIAGNOSIS — R740 Nonspecific elevation of levels of transaminase and lactic acid dehydrogenase [LDH]: Secondary | ICD-10-CM | POA: Diagnosis not present

## 2014-09-30 ENCOUNTER — Other Ambulatory Visit: Payer: Self-pay | Admitting: *Deleted

## 2014-09-30 DIAGNOSIS — D649 Anemia, unspecified: Secondary | ICD-10-CM

## 2014-09-30 DIAGNOSIS — R748 Abnormal levels of other serum enzymes: Secondary | ICD-10-CM

## 2014-09-30 LAB — BASIC METABOLIC PANEL
BUN/Creatinine Ratio: 18 (ref 10–22)
BUN: 20 mg/dL (ref 8–27)
CO2: 21 mmol/L (ref 18–29)
Calcium: 9.8 mg/dL (ref 8.6–10.2)
Chloride: 105 mmol/L (ref 97–108)
Creatinine, Ser: 1.11 mg/dL (ref 0.76–1.27)
GFR calc Af Amer: 77 mL/min/{1.73_m2} (ref >59)
GFR calc non Af Amer: 67 mL/min/{1.73_m2} (ref >59)
Glucose: 103 mg/dL — ABNORMAL HIGH (ref 65–99)
Potassium: 4.8 mmol/L (ref 3.5–5.2)
Sodium: 144 mmol/L (ref 134–144)

## 2014-09-30 LAB — HEMOGLOBIN A1C
Est. average glucose Bld gHb Est-mCnc: 137 mg/dL
Hgb A1c MFr Bld: 6.4 % — ABNORMAL HIGH (ref 4.8–5.6)

## 2014-09-30 LAB — HEPATIC FUNCTION PANEL
ALT: 39 IU/L (ref 0–44)
AST: 45 IU/L — ABNORMAL HIGH (ref 0–40)
Albumin: 4.7 g/dL (ref 3.5–4.8)
Alkaline Phosphatase: 89 IU/L (ref 39–117)
Bilirubin Total: 0.2 mg/dL (ref 0.0–1.2)
Bilirubin, Direct: 0.12 mg/dL (ref 0.00–0.40)
Total Protein: 7.7 g/dL (ref 6.0–8.5)

## 2014-09-30 LAB — LIPID PANEL
Chol/HDL Ratio: 3.6 ratio units (ref 0.0–5.0)
Cholesterol, Total: 177 mg/dL (ref 100–199)
HDL: 49 mg/dL (ref >39)
LDL Calculated: 107 mg/dL — ABNORMAL HIGH (ref 0–99)
Triglycerides: 104 mg/dL (ref 0–149)
VLDL Cholesterol Cal: 21 mg/dL (ref 5–40)

## 2014-10-01 ENCOUNTER — Encounter: Payer: Self-pay | Admitting: Family Medicine

## 2014-10-07 ENCOUNTER — Encounter: Payer: Self-pay | Admitting: Family Medicine

## 2014-10-07 ENCOUNTER — Ambulatory Visit (INDEPENDENT_AMBULATORY_CARE_PROVIDER_SITE_OTHER): Payer: Medicare Other | Admitting: Family Medicine

## 2014-10-07 VITALS — BP 134/86 | Temp 98.7°F | Ht 68.5 in | Wt 203.0 lb

## 2014-10-07 DIAGNOSIS — L03311 Cellulitis of abdominal wall: Secondary | ICD-10-CM | POA: Diagnosis not present

## 2014-10-07 MED ORDER — OXYCODONE-ACETAMINOPHEN 5-325 MG PO TABS: 1.0000 | ORAL_TABLET | ORAL | Status: DC | PRN

## 2014-10-07 MED ORDER — DOXYCYCLINE HYCLATE 100 MG PO CAPS: 100.0000 mg | ORAL_CAPSULE | Freq: Two times a day (BID) | ORAL | Status: DC

## 2014-10-07 NOTE — Patient Instructions (Signed)
Warm compresses fpor 10 minutes every hour  If becomes abscess then recheck here or ER

## 2014-10-07 NOTE — Progress Notes (Signed)
   Subjective:    Patient ID: Kerry Solis, male    DOB: 1946-01-05, 69 y.o.   MRN: [id number]  HPIPulled tick off of abdomen about 3 days ago. Area is red, swollen and painful. Has tried alcohol and neosporin.   Relates a tick bite occurred 3 days redness occurred 2 days ago with inflammation tenderness denies fever  Review of Systems Denies high fever chills nausea vomiting diarrhea.    Objective:   Physical Exam  Cellulitis of the abdomen no abscess lungs clear heart regular tick bite noted occasional upper abdomen      Assessment & Plan:  Tick bite-localized irritation Cellulitis of the abdomen Doxycycline twice a day 10 days Warning signs discussed If high fevers progressive troubles or problems follow-up

## 2014-10-11 ENCOUNTER — Emergency Department (HOSPITAL_COMMUNITY)
Admission: EM | Admit: 2014-10-11 | Discharge: 2014-10-11 | Disposition: A | Discharge status: Home / Self Care | Payer: Medicare Other | Attending: Emergency Medicine | Admitting: Emergency Medicine

## 2014-10-11 ENCOUNTER — Emergency Department (HOSPITAL_COMMUNITY): Payer: Medicare Other

## 2014-10-11 ENCOUNTER — Encounter (HOSPITAL_COMMUNITY): Payer: Self-pay | Admitting: Emergency Medicine

## 2014-10-11 DIAGNOSIS — L03311 Cellulitis of abdominal wall: Secondary | ICD-10-CM | POA: Diagnosis not present

## 2014-10-11 DIAGNOSIS — I252 Old myocardial infarction: Secondary | ICD-10-CM | POA: Diagnosis not present

## 2014-10-11 DIAGNOSIS — Z862 Personal history of diseases of the blood and blood-forming organs and certain disorders involving the immune mechanism: Secondary | ICD-10-CM | POA: Insufficient documentation

## 2014-10-11 DIAGNOSIS — R935 Abnormal findings on diagnostic imaging of other abdominal regions, including retroperitoneum: Secondary | ICD-10-CM | POA: Diagnosis not present

## 2014-10-11 DIAGNOSIS — Z21 Asymptomatic human immunodeficiency virus [HIV] infection status: Secondary | ICD-10-CM | POA: Insufficient documentation

## 2014-10-11 DIAGNOSIS — E781 Pure hyperglyceridemia: Secondary | ICD-10-CM | POA: Diagnosis not present

## 2014-10-11 DIAGNOSIS — Z8619 Personal history of other infectious and parasitic diseases: Secondary | ICD-10-CM | POA: Insufficient documentation

## 2014-10-11 DIAGNOSIS — H409 Unspecified glaucoma: Secondary | ICD-10-CM | POA: Diagnosis not present

## 2014-10-11 DIAGNOSIS — I10 Essential (primary) hypertension: Secondary | ICD-10-CM | POA: Diagnosis not present

## 2014-10-11 DIAGNOSIS — L02211 Cutaneous abscess of abdominal wall: Secondary | ICD-10-CM

## 2014-10-11 DIAGNOSIS — Z79899 Other long term (current) drug therapy: Secondary | ICD-10-CM | POA: Insufficient documentation

## 2014-10-11 LAB — CBC WITH DIFFERENTIAL/PLATELET
Basophils Absolute: 0 10*3/uL (ref 0.0–0.1)
Basophils Relative: 0 % (ref 0–1)
Eosinophils Absolute: 0 10*3/uL (ref 0.0–0.7)
Eosinophils Relative: 1 % (ref 0–5)
HCT: 39.5 % (ref 39.0–52.0)
Hemoglobin: 12.8 g/dL — ABNORMAL LOW (ref 13.0–17.0)
Lymphocytes Relative: 34 % (ref 12–46)
Lymphs Abs: 1.4 10*3/uL (ref 0.7–4.0)
MCH: 30.8 pg (ref 26.0–34.0)
MCHC: 32.4 g/dL (ref 30.0–36.0)
MCV: 95.2 fL (ref 78.0–100.0)
Monocytes Absolute: 0.5 10*3/uL (ref 0.1–1.0)
Monocytes Relative: 11 % (ref 3–12)
Neutro Abs: 2.1 10*3/uL (ref 1.7–7.7)
Neutrophils Relative %: 54 % (ref 43–77)
Platelets: 235 10*3/uL (ref 150–400)
RBC: 4.15 MIL/uL — ABNORMAL LOW (ref 4.22–5.81)
RDW: 13.8 % (ref 11.5–15.5)
WBC: 4 10*3/uL (ref 4.0–10.5)

## 2014-10-11 MED ORDER — DOXYCYCLINE HYCLATE 100 MG PO TABS
100.0000 mg | ORAL_TABLET | Freq: Once | ORAL | Status: AC
Start: 2014-10-11 — End: 2014-10-11
  Administered 2014-10-11: 100 mg via ORAL
  Filled 2014-10-11: qty 1

## 2014-10-11 MED ORDER — CIPROFLOXACIN HCL 250 MG PO TABS
500.0000 mg | ORAL_TABLET | Freq: Once | ORAL | Status: AC
  Administered 2014-10-11: 500 mg via ORAL
  Filled 2014-10-11: qty 2

## 2014-10-11 NOTE — ED Provider Notes (Signed)
CSN: [id number]     Arrival date & time 10/11/14  0848 History   First MD Initiated Contact with Patient 10/11/14 [phone]     Chief Complaint  Patient presents with  . Abscess     (Consider location/radiation/quality/duration/timing/severity/associated sxs/prior Treatment) HPI Comments: Patient is a 69 year old male who presents to the emergency department with a complaint of left sided abdominal pain. The patient states that over the past one to one and a half weeks he has been noticing increasing redness, increasing warmth, and increasing pain to the left lower abdomen. He states that he has to give himself injections for multiple sclerosis. He was seen by his primary physician, given some medication for pain, and told to come to the emergency department for possible incision and drainage. The patient denies high fever. He denies chills, he states he has a great deal of pain though in the left lower portion of the abdomen. Patient denies being diabetic.  Patient is a 69 y.o. male presenting with abscess. The history is provided by the patient.  Abscess   Past Medical History  Diagnosis Date  . Multiple sclerosis   . Hypertension   . HIV (human immunodeficiency virus infection)   . Myocardial infarction   . Leukopenia     Low CD4  . Cryptococcal meningitis   . Elevated liver enzymes   . High triglycerides   . Glaucoma    Past Surgical History  Procedure Laterality Date  . Cholecystectomy  06/10/2011    Procedure: LAPAROSCOPIC CHOLECYSTECTOMY;  Surgeon: Donato Heinz, MD;  Location: [facility];  Service: General;  Laterality: N/A;  . Neck surgery    . Esophagogastroduodenoscopy     Family History  Problem Relation Age of Onset  . Hypertension Mother   . Heart attack Mother   . Heart attack Father   . Cancer Sister     Breast  . Diabetes Sister   . Cancer Brother     Colon  . Diabetes Brother   . Cancer Brother     colon   History  Substance Use Topics  . Smoking status:  Never Smoker   . Smokeless tobacco: Never Used  . Alcohol Use: No    Review of Systems  Skin: Positive for wound.  Neurological: Positive for weakness.  All other systems reviewed and are negative.     Allergies  Review of patient's allergies indicates no known allergies.  Home Medications   Prior to Admission medications   Medication Sig Start Date End Date Taking? Authorizing Provider  acetaminophen (TYLENOL) 500 MG tablet Take 1,000 mg by mouth 3 (three) times daily as needed for mild pain or moderate pain.   Yes Historical Provider, MD  albuterol (PROVENTIL HFA;VENTOLIN HFA) 108 (90 BASE) MCG/ACT inhaler Inhale 2 puffs into the lungs every 6 (six) hours as needed for wheezing. 01/13/13  Yes Kathyrn Drown, MD  allopurinol (ZYLOPRIM) 300 MG tablet TAKE (1) TABLET BY MOUTH ONCE DAILY FOR GOUT. 08/27/14  Yes Mikey Kirschner, MD  amLODipine (NORVASC) 10 MG tablet TAKE (1) TABLET BY MOUTH DAILY FOR HIGH BLOOD PRESSURE. 08/16/14  Yes Kathyrn Drown, MD  citalopram (CELEXA) 20 MG tablet TAKE 1 TABLET BY MOUTH ONCE DAILY. 04/26/14  Yes Kathyrn Drown, MD  doxycycline (VIBRAMYCIN) 100 MG capsule Take 1 capsule (100 mg total) by mouth 2 (two) times daily. 10/07/14  Yes Kathyrn Drown, MD  interferon beta-1b (BETASERON) 0.3 MG injection Inject 44 mg into the skin every  Monday, Wednesday, and Friday.     Yes Historical Provider, MD  latanoprost (XALATAN) 0.005 % ophthalmic solution Place 1 drop into the left eye at bedtime.  07/15/14  Yes Historical Provider, MD  lisinopril-hydrochlorothiazide (PRINZIDE,ZESTORETIC) 10-12.5 MG per tablet Take 1 tablet by mouth daily. 04/30/14  Yes Kathyrn Drown, MD  oxyCODONE-acetaminophen (PERCOCET/ROXICET) 5-325 MG per tablet Take 1 tablet by mouth every 4 (four) hours as needed for severe pain. 10/07/14  Yes Kathyrn Drown, MD  pravastatin (PRAVACHOL) 40 MG tablet Take 1 tablet (40 mg total) by mouth every evening. 01/25/14 01/25/15 Yes Scott A Luking, MD   BP  137/84 mmHg  Pulse 85  Temp(Src) 98.4 F (36.9 C) (Oral)  Resp 18  Ht 5\' 8"  (1.727 m)  Wt 203 lb (92.08 kg)  BMI 30.87 kg/m2  SpO2 95% Physical Exam  Constitutional: He is oriented to person, place, and time. He appears well-developed and well-nourished.  Non-toxic appearance.  HENT:  Head: Normocephalic.  Right Ear: Tympanic membrane and external ear normal.  Left Ear: Tympanic membrane and external ear normal.  Eyes: EOM and lids are normal. Pupils are equal, round, and reactive to light.  Neck: Normal range of motion. Neck supple. Carotid bruit is not present.  Cardiovascular: Normal rate, regular rhythm, normal heart sounds, intact distal pulses and normal pulses.   Pulmonary/Chest: Breath sounds normal. No respiratory distress.  Abdominal: Soft. Bowel sounds are normal. There is no tenderness. There is no guarding.    Musculoskeletal: Normal range of motion.  Lymphadenopathy:       Head (right side): No submandibular adenopathy present.       Head (left side): No submandibular adenopathy present.    He has no cervical adenopathy.  Neurological: He is alert and oriented to person, place, and time. He has normal strength. No cranial nerve deficit or sensory deficit.  Skin: Skin is warm and dry.  Psychiatric: He has a normal mood and affect. His speech is normal.  Nursing note and vitals reviewed.   ED Course  Procedures (including critical care time) Labs Review Labs Reviewed  CBC WITH DIFFERENTIAL/PLATELET - Abnormal; Notable for the following:    RBC 4.15 (*)    Hemoglobin 12.8 (*)    All other components within normal limits    Imaging Review US Abdomen Limited  10/11/2014   CLINICAL DATA:  Question abscess of the abdominal wall near the umbilicus due to cellulitis at site of injection.  EXAM: LIMITED ABDOMINAL ULTRASOUND  COMPARISON:  None.  FINDINGS: Grayscale and color Doppler imaging of the abdominal wall in the area of concern was performed, left of the  umbilicus. There are bands of edema compatible with inflammation, but no fluid collection. Normal Doppler filling of the subcutaneous veins.  IMPRESSION: Periumbilical abdominal wall edema/inflammation without abscess.   Electronically Signed   By: Monte Fantasia M.D.   On: 10/11/2014 10:51     EKG Interpretation None      MDM  No acute findings of the vital signs. Ultrasound of the abdominal wall reveals wall edema and inflammation, but no abscess present. The findings on the examination and the ultrasound are consistent with cellulitis related to injections in the abdominal wall. The patient will be treated with Cipro and doxycycline. The patient will be asked to apply warm compresses to the area, or use warm tub soaks. The patient will further be asked to see the primary physician for recheck in 3 or 4 days. The patient is advised  to return to the emergency department immediately if any changes, problems, or concerns.    Final diagnoses:  Abscess of abdominal wall    *I have reviewed nursing notes, vital signs, and all appropriate lab and imaging results for this patient.Lily Kocher, PA-C 10/11/14 1115  Orpah Greek, MD 10/11/14 1600

## 2014-10-11 NOTE — Discharge Instructions (Signed)
Your ultrasound is negative for abscess, or need for incision and drainage.  Cellulitis Cellulitis is an infection of the skin and the tissue under the skin. The infected area is usually red and tender. This happens most often in the arms and lower legs. HOME CARE   Take your antibiotic medicine as told. Finish the medicine even if you start to feel better.  Keep the infected arm or leg raised (elevated).  Put a warm cloth on the area up to 4 times per day.  Only take medicines as told by your doctor.  Keep all doctor visits as told. GET HELP IF:  You see red streaks on the skin coming from the infected area.  Your red area gets bigger or turns a dark color.  Your bone or joint under the infected area is painful after the skin heals.  Your infection comes back in the same area or different area.  You have a puffy (swollen) bump in the infected area.  You have new symptoms.  You have a fever. GET HELP RIGHT AWAY IF:   You feel very sleepy.  You throw up (vomit) or have watery poop (diarrhea).  You feel sick and have muscle aches and pains. MAKE SURE YOU:   Understand these instructions.  Will watch your condition.  Will get help right away if you are not doing well or get worse. Document Released: 11/21/2007 Document Revised: 10/19/2013 Document Reviewed: 08/20/2011 [facility] Patient Information 2015 [place], [place]. This information is not intended to replace advice given to you by your health care provider. Make sure you discuss any questions you have with your health care provider. Please apply heating pain to the cellulitis area. Please continue your doxycycline, use Tylenol for mild pain, may use your narcotic pain medicine for more severe pain. Please see Dr. [name] or return to the emergency department if any changes, problems, or concerns.

## 2014-10-11 NOTE — ED Notes (Signed)
PT c/o right sided abdominal pain with red and hot to the touch area noted. PT states giving himself SQ MS injections to his abdomen x1 week.

## 2014-10-11 NOTE — ED Notes (Signed)
Pt c/o abscess to his L abdomen. Pt states he was seen by his Dr. And told to come to the ED for I&D.

## 2014-10-18 ENCOUNTER — Ambulatory Visit (INDEPENDENT_AMBULATORY_CARE_PROVIDER_SITE_OTHER): Payer: Medicare Other | Admitting: Family Medicine

## 2014-10-18 ENCOUNTER — Encounter: Payer: Self-pay | Admitting: Family Medicine

## 2014-10-18 VITALS — Ht 68.5 in | Wt 203.0 lb

## 2014-10-18 DIAGNOSIS — L03311 Cellulitis of abdominal wall: Secondary | ICD-10-CM

## 2014-10-18 MED ORDER — OXYCODONE-ACETAMINOPHEN 5-325 MG PO TABS: 1.0000 | ORAL_TABLET | ORAL | Status: DC | PRN

## 2014-10-18 MED ORDER — CLINDAMYCIN HCL 300 MG PO CAPS: 300.0000 mg | ORAL_CAPSULE | Freq: Three times a day (TID) | ORAL | Status: DC

## 2014-10-18 NOTE — Progress Notes (Signed)
   Subjective:    Patient ID: Kerry Solis, male    DOB: 08-Aug-1945, 68 y.o.   MRN: [id number]  HPI  Patient arrives for a follow up on a recent ER visit for cellulitis of the abdomen. Patient states he has finished his antibiotics (Cipro and doxycycline) but still having problems with the area. Patient states that antibiotics really did not seem to do a whole lot. Went to the ER they did ultrasound and blood work and did not necessarily find much of anything. Still having tenderness and soreness using pain medicine appropriately but needs a refill he only uses this when at home Review of Systems Relates pain discomfort denies any drainage    Objective:   Physical Exam On physical exam lungs clear heart regular abdomen soft he has a tender area on the lower left abdomen there is no fluctuance with its proximally 3 inches tall by 2 inches by       Assessment & Plan:  The area is 3 inches tall by 2 incges wide  He is stop the doxycycline ran out of it. It did not really help a whole lot We will try clindamycin 3 times daily follow-up in one week warm compresses frequently if it becomes an area of fluctuance needs to follow-up

## 2014-10-23 ENCOUNTER — Other Ambulatory Visit: Payer: Self-pay | Admitting: Family Medicine

## 2014-10-25 ENCOUNTER — Ambulatory Visit (INDEPENDENT_AMBULATORY_CARE_PROVIDER_SITE_OTHER): Payer: Medicare Other | Admitting: Family Medicine

## 2014-10-25 ENCOUNTER — Encounter: Payer: Self-pay | Admitting: Family Medicine

## 2014-10-25 VITALS — BP 160/94 | Temp 98.4°F | Ht 68.5 in | Wt 198.0 lb

## 2014-10-25 DIAGNOSIS — L03311 Cellulitis of abdominal wall: Secondary | ICD-10-CM | POA: Diagnosis not present

## 2014-10-25 MED ORDER — CLINDAMYCIN HCL 300 MG PO CAPS: 300.0000 mg | ORAL_CAPSULE | Freq: Three times a day (TID) | ORAL | Status: DC

## 2014-10-25 NOTE — Progress Notes (Signed)
   Subjective:    Patient ID: Kerry Solis, male    DOB: Jun 06, 1946, 69 y.o.   MRN: [id number]  HPIFollow up on cellulitis on abdomen. Taking clindamycin.   This patient is being treated for cellulitis a light US of the abdomen. Currently on clindamycin is no longer tender start feel better is not having fevers he has a history of HIV. He has been seen in ER a couple weeks ago and ultrasound did not show an abscess.  Review of Systems He denies fever sweats chills. His HIV is under good control through specialist.    Objective:   Physical Exam  On exam the patient does not exhibit any evidence of discomfort lungs are clear hearts regular abdomen soft he has a large area of cellulitis approximately 3" x 2" forearm. Not fluctuant.      Assessment & Plan:  Abdominal cellulitis actually getting better compared where was but needs an additional 10 days of antibiotics warning signs regarding antibiotic side effects including intestinal infections were discussed.  Recheck this patient to weeks I also discussed with him what to watch for in regards to this area needs drainage. Currently it does not drainage.

## 2014-11-10 ENCOUNTER — Encounter: Payer: Self-pay | Admitting: Family Medicine

## 2014-11-10 ENCOUNTER — Ambulatory Visit (INDEPENDENT_AMBULATORY_CARE_PROVIDER_SITE_OTHER): Payer: Medicare Other | Admitting: Family Medicine

## 2014-11-10 VITALS — BP 122/80 | Ht 68.5 in | Wt 197.0 lb

## 2014-11-10 DIAGNOSIS — L03311 Cellulitis of abdominal wall: Secondary | ICD-10-CM

## 2014-11-10 NOTE — Progress Notes (Signed)
   Subjective:    Patient ID: Kerry Solis, male    DOB: Oct 03, 1945, 69 y.o.   MRN: [id number]  HPIFollow up on abscess on abdomen. Pt finished antibiotic and states it is much better.  No other concerns today.     Review of Systems No tenderness or pain or drainage.    Objective:   Physical Exam Cellulitis actually looking much better. Should be healing. Do not do any shots in this area       Assessment & Plan:  Cellulitis improved no need for antibodies

## 2014-11-12 DIAGNOSIS — H4011X4 Primary open-angle glaucoma, indeterminate stage: Secondary | ICD-10-CM | POA: Diagnosis not present

## 2014-11-12 DIAGNOSIS — H40001 Preglaucoma, unspecified, right eye: Secondary | ICD-10-CM | POA: Diagnosis not present

## 2014-11-16 ENCOUNTER — Telehealth: Payer: Self-pay | Admitting: Family Medicine

## 2014-11-16 NOTE — Telephone Encounter (Signed)
otc Immodium one qid prn , if ongoing into Friday then will need to do stool samples.

## 2014-11-16 NOTE — Telephone Encounter (Signed)
Pt called stating that he has had diarrhea for the past two days. Everything he eats or drinks goes straight through him. Pt is wanting to speak with a nurse regarding this.

## 2014-11-16 NOTE — Telephone Encounter (Signed)
Discussed with patient. Patient advised otc Immodium one qid prn , if ongoing into Friday then will need to do stool samples. Patient verbalized understanding.

## 2014-11-16 NOTE — Telephone Encounter (Signed)
Patient stated he started with watery diarrhea yest and today-no nausea, vomiting-no bloody stool, no mucus, no pain and no fever just diarrhea. Patient wants to know what he can try to ease it up.

## 2014-12-22 ENCOUNTER — Encounter (HOSPITAL_COMMUNITY): Payer: Self-pay | Admitting: Emergency Medicine

## 2014-12-22 ENCOUNTER — Other Ambulatory Visit: Payer: Self-pay | Admitting: Family Medicine

## 2014-12-22 ENCOUNTER — Emergency Department (HOSPITAL_COMMUNITY): Payer: Medicare Other

## 2014-12-22 ENCOUNTER — Emergency Department (HOSPITAL_COMMUNITY)
Admission: EM | Admit: 2014-12-22 | Discharge: 2014-12-22 | Disposition: A | Discharge status: Home / Self Care | Payer: Medicare Other | Attending: Emergency Medicine | Admitting: Emergency Medicine

## 2014-12-22 DIAGNOSIS — I10 Essential (primary) hypertension: Secondary | ICD-10-CM | POA: Diagnosis not present

## 2014-12-22 DIAGNOSIS — H409 Unspecified glaucoma: Secondary | ICD-10-CM | POA: Diagnosis not present

## 2014-12-22 DIAGNOSIS — D72819 Decreased white blood cell count, unspecified: Secondary | ICD-10-CM | POA: Insufficient documentation

## 2014-12-22 DIAGNOSIS — E781 Pure hyperglyceridemia: Secondary | ICD-10-CM | POA: Diagnosis not present

## 2014-12-22 DIAGNOSIS — Z8619 Personal history of other infectious and parasitic diseases: Secondary | ICD-10-CM | POA: Insufficient documentation

## 2014-12-22 DIAGNOSIS — R29898 Other symptoms and signs involving the musculoskeletal system: Secondary | ICD-10-CM | POA: Diagnosis not present

## 2014-12-22 DIAGNOSIS — G35 Multiple sclerosis: Secondary | ICD-10-CM | POA: Insufficient documentation

## 2014-12-22 DIAGNOSIS — Z21 Asymptomatic human immunodeficiency virus [HIV] infection status: Secondary | ICD-10-CM | POA: Insufficient documentation

## 2014-12-22 DIAGNOSIS — I252 Old myocardial infarction: Secondary | ICD-10-CM | POA: Insufficient documentation

## 2014-12-22 DIAGNOSIS — G629 Polyneuropathy, unspecified: Secondary | ICD-10-CM | POA: Diagnosis not present

## 2014-12-22 DIAGNOSIS — G311 Senile degeneration of brain, not elsewhere classified: Secondary | ICD-10-CM | POA: Diagnosis not present

## 2014-12-22 DIAGNOSIS — R200 Anesthesia of skin: Secondary | ICD-10-CM | POA: Diagnosis present

## 2014-12-22 LAB — BASIC METABOLIC PANEL
Anion gap: 8 (ref 5–15)
BUN: 24 mg/dL — ABNORMAL HIGH (ref 6–20)
CO2: 24 mmol/L (ref 22–32)
Calcium: 8.9 mg/dL (ref 8.9–10.3)
Chloride: 108 mmol/L (ref 101–111)
Creatinine, Ser: 1.08 mg/dL (ref 0.61–1.24)
GFR calc Af Amer: >60 mL/min (ref >60)
GFR calc non Af Amer: >60 mL/min (ref >60)
Glucose, Bld: 111 mg/dL — ABNORMAL HIGH (ref 65–99)
Potassium: 4.1 mmol/L (ref 3.5–5.1)
Sodium: 140 mmol/L (ref 135–145)

## 2014-12-22 LAB — CBC WITH DIFFERENTIAL/PLATELET
Basophils Absolute: 0 10*3/uL (ref 0.0–0.1)
Basophils Relative: 1 % (ref 0–1)
Eosinophils Absolute: 0.1 10*3/uL (ref 0.0–0.7)
Eosinophils Relative: 2 % (ref 0–5)
HCT: 37.8 % — ABNORMAL LOW (ref 39.0–52.0)
Hemoglobin: 12.3 g/dL — ABNORMAL LOW (ref 13.0–17.0)
Lymphocytes Relative: 44 % (ref 12–46)
Lymphs Abs: 1.3 10*3/uL (ref 0.7–4.0)
MCH: 30.1 pg (ref 26.0–34.0)
MCHC: 32.5 g/dL (ref 30.0–36.0)
MCV: 92.4 fL (ref 78.0–100.0)
Monocytes Absolute: 0.4 10*3/uL (ref 0.1–1.0)
Monocytes Relative: 12 % (ref 3–12)
Neutro Abs: 1.2 10*3/uL — ABNORMAL LOW (ref 1.7–7.7)
Neutrophils Relative %: 41 % — ABNORMAL LOW (ref 43–77)
Platelets: 240 10*3/uL (ref 150–400)
RBC: 4.09 MIL/uL — ABNORMAL LOW (ref 4.22–5.81)
RDW: 14.8 % (ref 11.5–15.5)
WBC: 3 10*3/uL — ABNORMAL LOW (ref 4.0–10.5)

## 2014-12-22 MED ORDER — GADOBENATE DIMEGLUMINE 529 MG/ML IV SOLN
20.0000 mL | Freq: Once | INTRAVENOUS | Status: AC | PRN
  Administered 2014-12-22: 20 mL via INTRAVENOUS

## 2014-12-22 NOTE — ED Provider Notes (Addendum)
CSN: [id number]     Arrival date & time 12/22/14  0809 History  This chart was scribed for Orpah Greek, MD by Starleen Arms, ED Scribe. This patient was seen in room [facility] and the patient's care was started at 8:18 AM.   Chief Complaint  Patient presents with  . Numbness   The history is provided by the patient. No language interpreter was used.   HPI Comments: Kerry Solis is a 69 y.o. male with a history of MS who presents to the Emergency Department complaining of numbness to the top of the right foot onset two days ago.  Yesterday, the complaint began to spread up to the right knee with similar quality.  He denies history of similar episode.  He reports today's complaint is atypical of his MS flares, which typically feel "flu-like" with generalized body aches. He denies BUE, facial, left leg, or other numbness/weakness.   Neuorologist: Ala Bent, MD ([place])  Past Medical History  Diagnosis Date  . Multiple sclerosis   . Hypertension   . HIV (human immunodeficiency virus infection)   . Myocardial infarction   . Leukopenia     Low CD4  . Cryptococcal meningitis   . Elevated liver enzymes   . High triglycerides   . Glaucoma    Past Surgical History  Procedure Laterality Date  . Cholecystectomy  06/10/2011    Procedure: LAPAROSCOPIC CHOLECYSTECTOMY;  Surgeon: Donato Heinz, MD;  Location: [facility];  Service: General;  Laterality: N/A;  . Neck surgery    . Esophagogastroduodenoscopy     Family History  Problem Relation Age of Onset  . Hypertension Mother   . Heart attack Mother   . Heart attack Father   . Cancer Sister     Breast  . Diabetes Sister   . Cancer Brother     Colon  . Diabetes Brother   . Cancer Brother     colon   History  Substance Use Topics  . Smoking status: Never Smoker   . Smokeless tobacco: Never Used  . Alcohol Use: No    Review of Systems  Neurological: Positive for numbness.  All other systems reviewed and  are negative.     Allergies  Review of patient's allergies indicates no known allergies.  Home Medications   Prior to Admission medications   Medication Sig Start Date End Date Taking? Authorizing Provider  acetaminophen (TYLENOL) 500 MG tablet Take 1,000 mg by mouth 3 (three) times daily as needed for mild pain or moderate pain.   Yes Historical Provider, MD  albuterol (PROVENTIL HFA;VENTOLIN HFA) 108 (90 BASE) MCG/ACT inhaler Inhale 2 puffs into the lungs every 6 (six) hours as needed for wheezing. 01/13/13  Yes Kathyrn Drown, MD  amitriptyline (ELAVIL) 25 MG tablet TK 2 TS PO AT NIGHT 12/11/14  Yes Historical Provider, MD  amLODipine (NORVASC) 10 MG tablet TAKE (1) TABLET BY MOUTH DAILY FOR HIGH BLOOD PRESSURE. 08/16/14  Yes Kathyrn Drown, MD  ATRIPLA 600-200-300 MG per tablet TK 1 T PO  NIGHTLY 12/11/14  Yes Historical Provider, MD  interferon beta-1b (BETASERON) 0.3 MG injection Inject 44 mg into the skin every Monday, Wednesday, and Friday.     Yes Historical Provider, MD  latanoprost (XALATAN) 0.005 % ophthalmic solution Place 1 drop into the left eye at bedtime.  07/15/14  Yes Historical Provider, MD  lisinopril-hydrochlorothiazide (PRINZIDE,ZESTORETIC) 10-12.5 MG per tablet TAKE 1 TABLET BY MOUTH ONCE DAILY. 10/25/14  Yes Nicki Reaper  A Luking, MD  oxyCODONE-acetaminophen (PERCOCET/ROXICET) 5-325 MG per tablet Take 1 tablet by mouth every 4 (four) hours as needed for severe pain. 10/18/14  Yes Kathyrn Drown, MD  allopurinol (ZYLOPRIM) 300 MG tablet TAKE (1) TABLET BY MOUTH ONCE DAILY FOR GOUT. 12/22/14   Kathyrn Drown, MD  citalopram (CELEXA) 20 MG tablet TAKE 1 TABLET BY MOUTH ONCE DAILY. 12/22/14   Kathyrn Drown, MD  pravastatin (PRAVACHOL) 40 MG tablet TAKE 1 TABLET BY MOUTH AT BEDTIME FOR CHOLESTEROL 12/22/14   Kathyrn Drown, MD   BP 144/87 mmHg  Pulse 75  Temp(Src) 97.8 F (36.6 C) (Oral)  Resp 21  Ht 5\' 8"  (1.727 m)  Wt 190 lb (86.183 kg)  BMI 28.90 kg/m2  SpO2 98% Physical Exam   Constitutional: He is oriented to person, place, and time. He appears well-developed and well-nourished. No distress.  HENT:  Head: Normocephalic and atraumatic.  Right Ear: Hearing normal.  Left Ear: Hearing normal.  Nose: Nose normal.  Mouth/Throat: Oropharynx is clear and moist and mucous membranes are normal.  Eyes: Conjunctivae and EOM are normal. Pupils are equal, round, and reactive to light.  Neck: Normal range of motion. Neck supple.  Cardiovascular: Regular rhythm, S1 normal and S2 normal.  Exam reveals no gallop and no friction rub.   No murmur heard. Pulmonary/Chest: Effort normal and breath sounds normal. No respiratory distress. He exhibits no tenderness.  Abdominal: Soft. Normal appearance and bowel sounds are normal. There is no hepatosplenomegaly. There is no tenderness. There is no rebound, no guarding, no tenderness at McBurney's point and negative Murphy's sign. No hernia.  Musculoskeletal: Normal range of motion.  Neurological: He is alert and oriented to person, place, and time. He has normal strength. No cranial nerve deficit or sensory deficit. Coordination normal. GCS eye subscore is 4. GCS verbal subscore is 5. GCS motor subscore is 6.  Patient has normal flexion and extension of both knees and ankles. He is able to raise both legs against gravity without difficulty.  Patient does have subjective decreased sensory deficit, specifically decreased pinprick from right knee to foot.  Skin: Skin is warm, dry and intact. No rash noted. No cyanosis.  Psychiatric: He has a normal mood and affect. His speech is normal and behavior is normal. Thought content normal.  Nursing note and vitals reviewed.   ED Course  Procedures (including critical care time)  DIAGNOSTIC STUDIES: Oxygen Saturation is 95% on RA, normal by my interpretation.    COORDINATION OF CARE:  8:22 AM Discussed treatment plan with patient at bedside.  Patient acknowledges and agrees with plan.     Labs Review Labs Reviewed  CBC WITH DIFFERENTIAL/PLATELET - Abnormal; Notable for the following:    WBC 3.0 (*)    RBC 4.09 (*)    Hemoglobin 12.3 (*)    HCT 37.8 (*)    Neutrophils Relative % 41 (*)    Neutro Abs 1.2 (*)    All other components within normal limits  BASIC METABOLIC PANEL - Abnormal; Notable for the following:    Glucose, Bld 111 (*)    BUN 24 (*)    All other components within normal limits    Imaging Review Mr Jeri Cos Wo Contrast  12/22/2014   CLINICAL DATA:  Multiple sclerosis.  Left leg numbness.  EXAM: MRI HEAD WITHOUT AND WITH CONTRAST  TECHNIQUE: Multiplanar, multiecho pulse sequences of the brain and surrounding structures were obtained without and with intravenous contrast.  CONTRAST:  39mL MULTIHANCE GADOBENATE DIMEGLUMINE 529 MG/ML IV SOLN  COMPARISON:  MRI head 02/18/2009  FINDINGS: Mild atrophy unchanged from the prior study. Negative for hydrocephalus  Interval improvement in periventricular and deep white matter lesions bilaterally. No new lesions. Brainstem and cerebellum normal.  Normal pituitary. Normal orbits bilaterally. Mucosal edema in the paranasal sinuses especially the right sphenoid sinus with progression from the prior study.  Negative for hemorrhage or mass.  No edema  Normal enhancement following contrast infusion.  Diffusion-weighted imaging reveals no acute infarct. No white matter lesions demonstrate restricted diffusion.  IMPRESSION: Periventricular and deep white matter lesions have improved from the prior study compatible with the diagnosis of multiple sclerosis. No new lesions and no acute demyelination noted.  Mild generalized atrophy.   Electronically Signed   By: Franchot Gallo M.D.   On: 12/22/2014 10:58     EKG Interpretation   Date/Time:  Wednesday December 22 2014 08:19:02 EDT Ventricular Rate:  87 PR Interval:  210 QRS Duration: 95 QT Interval:  396 QTC Calculation: 476 R Axis:   -36 Text Interpretation:  Sinus rhythm Left  axis deviation Borderline  prolonged QT interval Baseline wander in lead(s) II aVF V4 V5 V6 No  significant change since last tracing Confirmed by POLLINA  MD,  CHRISTOPHER [id number]) on 12/22/2014 8:53:42 AM      MDM   Final diagnoses:  MS (multiple sclerosis)  Neuropathy   multiple sclerosis  Neuropathy  Patient presents to the ER for evaluation of numbness of his right lower leg. Patient reports that it started in his foot several days ago and has progressed upwards. He has not expressing numbness from the knee down. Patient does have a history of MS, has not had similar symptoms to this previously, however. There is no headache. He has not had any speech disturbance. He does not notice any sensory or motor abnormality of the face or upper extremity. He is not noticing weakness of the leg, is having trouble walking at times because of the numbness, however.  Workup has been negative. This included MRI with and without contrast of brain. No stroke noted. No demyelinating lesions.  Discussed with Dr. Hall Busing, on call for neurology at [facility]. Patient normally sees Dr. Shelia Media. Presentation, symptoms, exam and studies were discussed. [place] on the fact that it is pearly sensory at this time, it is not recommended that the patient be admitted for IV sterile eats. She reports that there is no utility for oral steroids. Cannot rule out spinal lesion, but Dr. Hall Busing to confirm that he has never had spinal lesions previously. She felt that the patient could be further imaged as an outpatient.  Patient will be discharge, no change in his medications. Contact Dr. Shelia Media for follow-up. Return to ER or go to [facility] ER if he has weakness in the extremity or worsening of symptoms.  I personally performed the services described in this documentation, which was scribed in my presence. The recorded information has been reviewed and is accurate.    Orpah Greek, MD 12/22/14 [place],  MD 12/22/14 1310

## 2014-12-22 NOTE — ED Notes (Signed)
Pt still in MRI 

## 2014-12-22 NOTE — Discharge Instructions (Signed)

## 2014-12-22 NOTE — ED Notes (Addendum)
Pt reports top of right foot numbness since Saturday. Pt reports numbness has increased to right knee this am.pt denies headache,blurred vision, slurred speech. Equal grips noted. Facial symmetry noted. Pt also reports chest pain that started last night. Pt denies nausea. Pt nondiaphoretic. nad noted. Pt denies any known injury. Distal pulses moderate. cap refill >3 secs.

## 2014-12-22 NOTE — ED Notes (Signed)
MD Pollina at bedside updating patient and family.

## 2014-12-24 DIAGNOSIS — R208 Other disturbances of skin sensation: Secondary | ICD-10-CM | POA: Diagnosis not present

## 2014-12-24 DIAGNOSIS — E785 Hyperlipidemia, unspecified: Secondary | ICD-10-CM | POA: Diagnosis not present

## 2014-12-24 DIAGNOSIS — Z21 Asymptomatic human immunodeficiency virus [HIV] infection status: Secondary | ICD-10-CM | POA: Diagnosis not present

## 2014-12-24 DIAGNOSIS — I10 Essential (primary) hypertension: Secondary | ICD-10-CM | POA: Diagnosis not present

## 2014-12-24 DIAGNOSIS — R202 Paresthesia of skin: Secondary | ICD-10-CM | POA: Diagnosis not present

## 2014-12-24 DIAGNOSIS — G35 Multiple sclerosis: Secondary | ICD-10-CM | POA: Diagnosis not present

## 2015-01-08 DIAGNOSIS — R208 Other disturbances of skin sensation: Secondary | ICD-10-CM | POA: Diagnosis not present

## 2015-01-08 DIAGNOSIS — M47812 Spondylosis without myelopathy or radiculopathy, cervical region: Secondary | ICD-10-CM | POA: Diagnosis not present

## 2015-01-21 ENCOUNTER — Other Ambulatory Visit: Payer: Self-pay | Admitting: Family Medicine

## 2015-02-08 ENCOUNTER — Ambulatory Visit: Payer: Medicare Other | Admitting: Family Medicine

## 2015-02-22 ENCOUNTER — Other Ambulatory Visit: Payer: Self-pay | Admitting: Family Medicine

## 2015-03-08 ENCOUNTER — Encounter: Payer: Self-pay | Admitting: Family Medicine

## 2015-03-08 ENCOUNTER — Ambulatory Visit (INDEPENDENT_AMBULATORY_CARE_PROVIDER_SITE_OTHER): Payer: Medicare Other | Admitting: Family Medicine

## 2015-03-08 VITALS — BP 140/88 | Ht 68.5 in | Wt 203.5 lb

## 2015-03-08 DIAGNOSIS — E785 Hyperlipidemia, unspecified: Secondary | ICD-10-CM | POA: Diagnosis not present

## 2015-03-08 DIAGNOSIS — R7309 Other abnormal glucose: Secondary | ICD-10-CM | POA: Diagnosis not present

## 2015-03-08 DIAGNOSIS — R7303 Prediabetes: Secondary | ICD-10-CM

## 2015-03-08 DIAGNOSIS — R739 Hyperglycemia, unspecified: Secondary | ICD-10-CM | POA: Diagnosis not present

## 2015-03-08 DIAGNOSIS — I10 Essential (primary) hypertension: Secondary | ICD-10-CM

## 2015-03-08 DIAGNOSIS — Z23 Encounter for immunization: Secondary | ICD-10-CM

## 2015-03-08 DIAGNOSIS — Z1211 Encounter for screening for malignant neoplasm of colon: Secondary | ICD-10-CM

## 2015-03-08 MED ORDER — SERTRALINE HCL 50 MG PO TABS
50.0000 mg | ORAL_TABLET | Freq: Every day | ORAL | Status: DC
Start: 2015-03-08 — End: 2015-06-24

## 2015-03-08 NOTE — Progress Notes (Signed)
   Subjective:    Patient ID: Kerry Solis, male    DOB: February 08, 1946, 69 y.o.   MRN: [id number]  Hypertension This is a chronic problem. The current episode started more than 1 year ago. The problem has been gradually improving since onset. Pertinent negatives include no chest pain. There are no associated agents to hypertension. There are no known risk factors for coronary artery disease. Treatments tried: lisinopril-hctz. The current treatment provides moderate improvement. There are no compliance problems.    Patient has no concerns at this time.  Patient states that he has tried watch starches in diet try to exercise some unable to do a lot of walking because of his MS States his MS is been stable he did have a MRI the brain as well as the neck and thoracic spine he did not find the results I were able to look up the results and discussing with him today. He does watch fats in his diet He is due for colonoscopy He denies being depressed but he does state at times he gets tearful he was wondering if he could try something different Patient is due for some lab work No gout flareups lately   Review of Systems  Constitutional: Negative for activity change, appetite change and fatigue.  HENT: Negative for congestion.   Respiratory: Negative for cough.   Cardiovascular: Negative for chest pain.  Gastrointestinal: Negative for abdominal pain.  Endocrine: Negative for polydipsia and polyphagia.  Neurological: Negative for weakness.  Psychiatric/Behavioral: Negative for confusion.       Objective:   Physical Exam  Constitutional: He appears well-nourished. No distress.  Cardiovascular: Normal rate, regular rhythm and normal heart sounds.   No murmur heard. Pulmonary/Chest: Effort normal and breath sounds normal. No respiratory distress.  Musculoskeletal: He exhibits no edema.  Lymphadenopathy:    He has no cervical adenopathy.  Neurological: He is alert.  Psychiatric: His behavior  is normal.  Vitals reviewed.         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 Greater than 25 minutes spent with patient covering multiple different options and questions

## 2015-03-09 ENCOUNTER — Ambulatory Visit: Payer: Medicare Other | Admitting: Family Medicine

## 2015-03-09 DIAGNOSIS — E785 Hyperlipidemia, unspecified: Secondary | ICD-10-CM | POA: Diagnosis not present

## 2015-03-09 DIAGNOSIS — R7309 Other abnormal glucose: Secondary | ICD-10-CM | POA: Diagnosis not present

## 2015-03-10 ENCOUNTER — Encounter: Payer: Self-pay | Admitting: Family Medicine

## 2015-03-10 LAB — LIPID PANEL
Chol/HDL Ratio: 3.7 ratio units (ref 0.0–5.0)
Cholesterol, Total: 166 mg/dL (ref 100–199)
HDL: 45 mg/dL (ref >39)
LDL Calculated: 100 mg/dL — ABNORMAL HIGH (ref 0–99)
Triglycerides: 106 mg/dL (ref 0–149)
VLDL Cholesterol Cal: 21 mg/dL (ref 5–40)

## 2015-03-10 LAB — HEMOGLOBIN A1C
Est. average glucose Bld gHb Est-mCnc: 143 mg/dL
Hgb A1c MFr Bld: 6.6 % — ABNORMAL HIGH (ref 4.8–5.6)

## 2015-03-15 ENCOUNTER — Telehealth: Payer: Self-pay

## 2015-03-15 NOTE — Telephone Encounter (Signed)
PATIENT RECEIVED LETTER TO SCHEDULE TCS  PLEASE CALL [phone]

## 2015-03-18 NOTE — Telephone Encounter (Signed)
Pt has appt with Neil Crouch, PA on 03/31/2015 at 3:00 pm. He has a hx of tubular adenoma.

## 2015-03-30 DIAGNOSIS — Z23 Encounter for immunization: Secondary | ICD-10-CM | POA: Diagnosis not present

## 2015-03-30 DIAGNOSIS — F418 Other specified anxiety disorders: Secondary | ICD-10-CM | POA: Diagnosis not present

## 2015-03-30 DIAGNOSIS — Z79899 Other long term (current) drug therapy: Secondary | ICD-10-CM | POA: Diagnosis not present

## 2015-03-30 DIAGNOSIS — B20 Human immunodeficiency virus [HIV] disease: Secondary | ICD-10-CM | POA: Diagnosis not present

## 2015-03-30 DIAGNOSIS — I10 Essential (primary) hypertension: Secondary | ICD-10-CM | POA: Diagnosis not present

## 2015-03-30 DIAGNOSIS — K0889 Other specified disorders of teeth and supporting structures: Secondary | ICD-10-CM | POA: Diagnosis not present

## 2015-03-30 DIAGNOSIS — Z792 Long term (current) use of antibiotics: Secondary | ICD-10-CM | POA: Diagnosis not present

## 2015-03-30 DIAGNOSIS — G35 Multiple sclerosis: Secondary | ICD-10-CM | POA: Diagnosis not present

## 2015-03-31 ENCOUNTER — Encounter: Payer: Self-pay | Admitting: Gastroenterology

## 2015-03-31 ENCOUNTER — Other Ambulatory Visit: Payer: Self-pay

## 2015-03-31 ENCOUNTER — Ambulatory Visit (INDEPENDENT_AMBULATORY_CARE_PROVIDER_SITE_OTHER): Payer: Medicare Other | Admitting: Gastroenterology

## 2015-03-31 VITALS — BP 156/92 | HR 97 | Temp 98.2°F | Ht 69.0 in | Wt 201.8 lb

## 2015-03-31 DIAGNOSIS — Z800 Family history of malignant neoplasm of digestive organs: Secondary | ICD-10-CM | POA: Insufficient documentation

## 2015-03-31 DIAGNOSIS — Z860100 Personal history of colon polyps, unspecified: Secondary | ICD-10-CM | POA: Insufficient documentation

## 2015-03-31 MED ORDER — NA SULFATE-K SULFATE-MG SULF 17.5-3.13-1.6 GM/177ML PO SOLN: 1.0000 | ORAL | Status: DC

## 2015-03-31 NOTE — Assessment & Plan Note (Addendum)
69 year old gentleman with history of adenomatous colon polyps, family history of colon cancer in 2 brothers who presents for surveillance colonoscopy. Doing well outside from rare rectal bleeding felt to be due to hemorrhoids. Notes that he woke up with during his last colonoscopy. Prefers deeper sedation. Given polypharmacy, will plan on deep sedation in the OR.  I have discussed the risks, alternatives, benefits with regards to but not limited to the risk of reaction to medication, bleeding, infection, perforation and the patient is agreeable to proceed. Written consent to be obtained.  TCS with overtube per last TCS report.

## 2015-03-31 NOTE — Patient Instructions (Signed)
1. Colonoscopy as scheduled. See separate instructions.  

## 2015-03-31 NOTE — Progress Notes (Addendum)
Primary Care Physician:  Sallee Lange, MD  Primary Gastroenterologist:  Barney Drain, MD   Chief Complaint  Patient presents with  . set up TCS    HPI:  Kerry Solis is a 69 y.o. male here to schedule surveillance colonoscopy. He has a personal history of adenomatous colon polyps, family history of colon cancer in 2 brothers. Last colonoscopy in 2010, single tubular adenoma removed. Tortuous colon, multiple sigmoid colon diverticula, moderate internal hemorrhoids. Recommended to have colonoscopy in 5 years with Entrada overtube.  BM 2-3 times per day. Rare rectal bleeding felt to be due to hemorrhoids. No abdominal pain. Appetite is good. No dysphagia, vomiting. No heartburn. Rare percocet. States that he woke up during his last 2 colonoscopies and he wants a deeper sedation.   H/O elevated LFTs in past. Fatty liver on prior imaging. No longer consuming etoh. LFTs much improved and monitored by PCP.  Current Outpatient Prescriptions  Medication Sig Dispense Refill  . acetaminophen (TYLENOL) 500 MG tablet Take 1,000 mg by mouth 3 (three) times daily as needed for mild pain or moderate pain.    Marland Kitchen albuterol (PROVENTIL HFA;VENTOLIN HFA) 108 (90 BASE) MCG/ACT inhaler Inhale 2 puffs into the lungs every 6 (six) hours as needed for wheezing. 1 Inhaler 2  . allopurinol (ZYLOPRIM) 300 MG tablet TAKE (1) TABLET BY MOUTH ONCE DAILY FOR GOUT. 30 tablet 5  . amitriptyline (ELAVIL) 25 MG tablet TK 2 TS PO AT NIGHT  9  . amLODipine (NORVASC) 10 MG tablet TAKE (1) TABLET BY MOUTH DAILY FOR HIGH BLOOD PRESSURE. 30 tablet 0  . ATRIPLA 600-200-300 MG per tablet TK 1 T PO  NIGHTLY  8  . interferon beta-1b (BETASERON) 0.3 MG injection Inject 44 mg into the skin every Monday, Wednesday, and Friday.      . latanoprost (XALATAN) 0.005 % ophthalmic solution Place 1 drop into the left eye at bedtime.     Marland Kitchen lisinopril-hydrochlorothiazide (PRINZIDE,ZESTORETIC) 10-12.5 MG per tablet TAKE (1) TABLET BY MOUTH DAILY  FOR HIGH BLOOD PRESSURE. 30 tablet 5  . oxyCODONE-acetaminophen (PERCOCET/ROXICET) 5-325 MG per tablet Take 1 tablet by mouth every 4 (four) hours as needed for severe pain. 30 tablet 0  . pravastatin (PRAVACHOL) 40 MG tablet TAKE 1 TABLET BY MOUTH AT BEDTIME FOR CHOLESTEROL 30 tablet 5  . sertraline (ZOLOFT) 50 MG tablet Take 1 tablet (50 mg total) by mouth daily. 30 tablet 3   No current facility-administered medications for this visit.    Allergies as of 03/31/2015  . (No Known Allergies)    Past Medical History  Diagnosis Date  . Multiple sclerosis ([place])   . Hypertension   . HIV (human immunodeficiency virus infection) ([place])   . Myocardial infarction ([place])   . Leukopenia     Low CD4  . Cryptococcal meningitis ([place])   . Elevated liver enzymes   . High triglycerides   . Glaucoma     Past Surgical History  Procedure Laterality Date  . Cholecystectomy  06/10/2011    Procedure: LAPAROSCOPIC CHOLECYSTECTOMY;  Surgeon: Donato Heinz, MD;  Location: [facility];  Service: General;  Laterality: N/A;  . Neck surgery    . Esophagogastroduodenoscopy  06/23/2008    MW[facility]:[id number] gastritis/schatzki ring  . Colonoscopy  03/10/2009    [id number] internal hemorrhoids/6-mm sessile ascending colon polyp/tortuous colon    Family History  Problem Relation Age of Onset  . Hypertension Mother   . Heart attack Mother   . Heart attack Father   .  Cancer Sister     Breast  . Diabetes Sister   . Cancer Brother     Colon  . Diabetes Brother   . Cancer Brother     colon    Social History   Social History  . Marital Status: Divorced    Spouse Name: N/A  . Number of Children: N/A  . Years of Education: N/A   Occupational History  . Not on file.   Social History Main Topics  . Smoking status: Never Smoker   . Smokeless tobacco: Never Used  . Alcohol Use: No  . Drug Use: No  . Sexual Activity: No   Other Topics Concern  . Not on file   Social History Narrative       ROS:  General: Negative for anorexia, weight loss, fever, chills, fatigue, weakness. Eyes: Negative for vision changes.  ENT: Negative for hoarseness, difficulty swallowing , nasal congestion. CV: Negative for chest pain, angina, palpitations, dyspnea on exertion, peripheral edema.  Respiratory: Negative for dyspnea at rest, dyspnea on exertion, cough, sputum, wheezing.  GI: See history of present illness. GU:  Negative for dysuria, hematuria, urinary incontinence, urinary frequency, nocturnal urination.  MS: Negative for joint pain, low back pain.  Derm: Negative for rash or itching.  Neuro: Negative for weakness, abnormal sensation, seizure, frequent headaches, memory loss, confusion.  Psych: Negative for anxiety, depression, suicidal ideation, hallucinations.  Endo: Negative for unusual weight change.  Heme: Negative for bruising or bleeding. Allergy: Negative for rash or hives.    Physical Examination:  BP 156/92 mmHg  Pulse 97  Temp(Src) 98.2 F (36.8 C)  Ht 5\' 9"  (1.753 m)  Wt 201 lb 12.8 oz (91.536 kg)  BMI 29.79 kg/m2   General: Well-nourished, well-developed in no acute distress.  Head: Normocephalic, atraumatic.   Eyes: Conjunctiva pink, no icterus. Mouth: Oropharyngeal mucosa moist and pink , no lesions erythema or exudate. Neck: Supple without thyromegaly, masses, or lymphadenopathy.  Lungs: Clear to auscultation bilaterally.  Heart: Regular rate and rhythm, no murmurs rubs or gallops.  Abdomen: Bowel sounds are normal, nontender, nondistended, no hepatosplenomegaly or masses, no abdominal bruits or    hernia , no rebound or guarding.   Rectal: deferred Extremities: No lower extremity edema. No clubbing or deformities.  Neuro: Alert and oriented x 4 , grossly normal neurologically.  Skin: Warm and dry, no rash or jaundice.   Psych: Alert and cooperative, normal mood and affect.  Labs: Lab Results  Component Value Date   WBC 3.0* 12/22/2014   HGB 12.3*  12/22/2014   HCT 37.8* 12/22/2014   MCV 92.4 12/22/2014   PLT 240 12/22/2014   Lab Results  Component Value Date   CREATININE 1.08 12/22/2014   BUN 24* 12/22/2014   NA 140 12/22/2014   K 4.1 12/22/2014   CL 108 12/22/2014   CO2 24 12/22/2014   Lab Results  Component Value Date   ALT 39 09/29/2014   AST 45* 09/29/2014   ALKPHOS 89 09/29/2014   BILITOT 0.2 09/29/2014     Imaging Studies: No results found.

## 2015-04-01 ENCOUNTER — Other Ambulatory Visit: Payer: Self-pay

## 2015-04-01 NOTE — Progress Notes (Signed)
CC'ED TO PCP 

## 2015-04-04 DIAGNOSIS — G35 Multiple sclerosis: Secondary | ICD-10-CM | POA: Diagnosis not present

## 2015-04-12 NOTE — Patient Instructions (Signed)
XABI WITTLER  04/12/2015     @PREFPERIOPPHARMACY @   Your procedure is scheduled on Tuesday, 04/19/15.  Report to [facility] at 1215  Call this number if you have problems the morning of surgery:  [phone]   Remember:  Do not eat food or drink liquids after midnight.  Take these medicines the morning of surgery with A SIP OF WATER albuterol, norvasc, zestoretic, percocet, zoloft   Do not wear jewelry, make-up or nail polish.  Do not wear lotions, powders, or perfumes.  You may wear deodorant.  Do not shave 48 hours prior to surgery.  Men may shave face and neck.  Do not bring valuables to the hospital.  [facility] is not responsible for any belongings or valuables.  Contacts, dentures or bridgework may not be worn into surgery.  Leave your suitcase in the car.  After surgery it may be brought to your room.  For patients admitted to the hospital, discharge time will be determined by your treatment team.  Patients discharged the day of surgery will not be allowed to drive home.   Name and phone number of your driver:   family Special instructions:  Follow instructions given to you by the office.  Please read over the following fact sheets that you were given. Anesthesia Post-op Instructions and Care and Recovery After Surgery      Colonoscopy, Care After Refer to this sheet in the next few weeks. These instructions provide you with information on caring for yourself after your procedure. Your health care provider may also give you more specific instructions. Your treatment has been planned according to current medical practices, but problems sometimes occur. Call your health care provider if you have any problems or questions after your procedure. WHAT TO EXPECT AFTER THE PROCEDURE  After your procedure, it is typical to have the following:  A small amount of blood in your stool.  Moderate amounts of gas and mild abdominal cramping or bloating. HOME CARE  INSTRUCTIONS  Do not drive, operate machinery, or sign important documents for 24 hours.  You may shower and resume your regular physical activities, but move at a slower pace for the first 24 hours.  Take frequent rest periods for the first 24 hours.  Walk around or put a warm pack on your abdomen to help reduce abdominal cramping and bloating.  Drink enough fluids to keep your urine clear or pale yellow.  You may resume your normal diet as instructed by your health care provider. Avoid heavy or fried foods that are hard to digest.  Avoid drinking alcohol for 24 hours or as instructed by your health care provider.  Only take over-the-counter or prescription medicines as directed by your health care provider.  If a tissue sample (biopsy) was taken during your procedure:  Do not take aspirin or blood thinners for 7 days, or as instructed by your health care provider.  Do not drink alcohol for 7 days, or as instructed by your health care provider.  Eat soft foods for the first 24 hours. SEEK MEDICAL CARE IF: You have persistent spotting of blood in your stool 2-3 days after the procedure. SEEK IMMEDIATE MEDICAL CARE IF:  You have more than a small spotting of blood in your stool.  You pass large blood clots in your stool.  Your abdomen is swollen (distended).  You have nausea or vomiting.  You have a fever.  You have increasing abdominal pain that is not relieved with medicine.  This information is not intended to replace advice given to you by your health care provider. Make sure you discuss any questions you have with your health care provider.   Document Released: 01/17/2004 Document Revised: 03/25/2013 Document Reviewed: 02/09/2013 Elsevier Interactive Patient Education 2016 [employer]. Colonoscopy A colonoscopy is an exam to look at the entire large intestine (colon). This exam can help find problems such as tumors, polyps, inflammation, and areas of bleeding. The  exam takes about 1 hour.  LET [facility] CARE PROVIDER KNOW ABOUT:   Any allergies you have.  All medicines you are taking, including vitamins, herbs, eye drops, creams, and over-the-counter medicines.  Previous problems you or members of your family have had with the use of anesthetics.  Any blood disorders you have.  Previous surgeries you have had.  Medical conditions you have. RISKS AND COMPLICATIONS  Generally, this is a safe procedure. However, as with any procedure, complications can occur. Possible complications include:  Bleeding.  Tearing or rupture of the colon wall.  Reaction to medicines given during the exam.  Infection (rare). BEFORE THE PROCEDURE   Ask your health care provider about changing or stopping your regular medicines.  You may be prescribed an oral bowel prep. This involves drinking a large amount of medicated liquid, starting the day before your procedure. The liquid will cause you to have multiple loose stools until your stool is almost clear or light green. This cleans out your colon in preparation for the procedure.  Do not eat or drink anything else once you have started the bowel prep, unless your health care provider tells you it is safe to do so.  Arrange for someone to drive you home after the procedure. PROCEDURE   You will be given medicine to help you relax (sedative).  You will lie on your side with your knees bent.  A long, flexible tube with a light and camera on the end (colonoscope) will be inserted through the rectum and into the colon. The camera sends video back to a computer screen as it moves through the colon. The colonoscope also releases carbon dioxide gas to inflate the colon. This helps your health care provider see the area better.  During the exam, your health care provider may take a small tissue sample (biopsy) to be examined under a microscope if any abnormalities are found.  The exam is finished when the entire  colon has been viewed. AFTER THE PROCEDURE   Do not drive for 24 hours after the exam.  You may have a small amount of blood in your stool.  You may pass moderate amounts of gas and have mild abdominal cramping or bloating. This is caused by the gas used to inflate your colon during the exam.  Ask when your test results will be ready and how you will get your results. Make sure you get your test results.   This information is not intended to replace advice given to you by your health care provider. Make sure you discuss any questions you have with your health care provider.   Document Released: 06/01/2000 Document Revised: 03/25/2013 Document Reviewed: 02/09/2013 Elsevier Interactive Patient Education [employer].

## 2015-04-13 ENCOUNTER — Encounter (HOSPITAL_COMMUNITY)
Admission: RE | Admit: 2015-04-13 | Discharge: 2015-04-13 | Disposition: A | Discharge status: Home / Self Care | Payer: Medicare Other | Source: Ambulatory Visit | Attending: Gastroenterology | Admitting: Gastroenterology

## 2015-04-13 ENCOUNTER — Encounter (HOSPITAL_COMMUNITY): Payer: Self-pay

## 2015-04-13 ENCOUNTER — Encounter: Payer: Self-pay | Admitting: Family Medicine

## 2015-04-13 DIAGNOSIS — Z800 Family history of malignant neoplasm of digestive organs: Secondary | ICD-10-CM | POA: Insufficient documentation

## 2015-04-13 DIAGNOSIS — Z01818 Encounter for other preprocedural examination: Secondary | ICD-10-CM | POA: Insufficient documentation

## 2015-04-13 HISTORY — DX: Anxiety disorder, unspecified: F41.9

## 2015-04-13 HISTORY — DX: Major depressive disorder, single episode, unspecified: F32.9

## 2015-04-13 HISTORY — DX: Unspecified osteoarthritis, unspecified site: M19.90

## 2015-04-13 HISTORY — DX: Depression, unspecified: F32.A

## 2015-04-13 HISTORY — DX: Personal history of other diseases of the musculoskeletal system and connective tissue: Z87.39

## 2015-04-13 LAB — BASIC METABOLIC PANEL
Anion gap: 8 (ref 5–15)
BUN: 21 mg/dL — ABNORMAL HIGH (ref 6–20)
CO2: 27 mmol/L (ref 22–32)
Calcium: 9.9 mg/dL (ref 8.9–10.3)
Chloride: 109 mmol/L (ref 101–111)
Creatinine, Ser: 1.06 mg/dL (ref 0.61–1.24)
GFR calc Af Amer: >60 mL/min (ref >60)
GFR calc non Af Amer: >60 mL/min (ref >60)
Glucose, Bld: 96 mg/dL (ref 65–99)
Potassium: 5.2 mmol/L — ABNORMAL HIGH (ref 3.5–5.1)
Sodium: 144 mmol/L (ref 135–145)

## 2015-04-13 LAB — CBC
HCT: 39.7 % (ref 39.0–52.0)
Hemoglobin: 12.8 g/dL — ABNORMAL LOW (ref 13.0–17.0)
MCH: 30.3 pg (ref 26.0–34.0)
MCHC: 32.2 g/dL (ref 30.0–36.0)
MCV: 93.9 fL (ref 78.0–100.0)
Platelets: 253 10*3/uL (ref 150–400)
RBC: 4.23 MIL/uL (ref 4.22–5.81)
RDW: 14.6 % (ref 11.5–15.5)
WBC: 4.1 10*3/uL (ref 4.0–10.5)

## 2015-04-13 NOTE — Progress Notes (Signed)
   04/13/15 1204  OBSTRUCTIVE SLEEP APNEA  Have you ever been diagnosed with sleep apnea through a sleep study? No  Do you snore loudly (loud enough to be heard through closed doors)?  1  Do you often feel tired, fatigued, or sleepy during the daytime (such as falling asleep during driving or talking to someone)? 1  Has anyone observed you stop breathing during your sleep? 0  Do you have, or are you being treated for high blood pressure? 1  BMI more than 35 kg/m2? 0  Age > 50 (1-yes) 1  Neck circumference greater than:Male 16 inches or larger, Male 17inches or larger? 0  Male Gender (Yes=1) 1  Obstructive Sleep Apnea Score 5  Score 5 or greater  Results sent to PCP

## 2015-04-13 NOTE — Pre-Procedure Instructions (Signed)
Patient given information to sign up for my chart at home. 

## 2015-04-19 ENCOUNTER — Encounter (HOSPITAL_COMMUNITY)
Admission: RE | Disposition: A | Discharge status: Home / Self Care | Payer: Self-pay | Source: Ambulatory Visit | Attending: Gastroenterology

## 2015-04-19 ENCOUNTER — Ambulatory Visit (HOSPITAL_COMMUNITY): Payer: Medicare Other | Admitting: Anesthesiology

## 2015-04-19 ENCOUNTER — Ambulatory Visit (HOSPITAL_COMMUNITY)
Admission: RE | Admit: 2015-04-19 | Discharge: 2015-04-19 | Disposition: A | Discharge status: Home / Self Care | Payer: Medicare Other | Source: Ambulatory Visit | Attending: Gastroenterology | Admitting: Gastroenterology

## 2015-04-19 DIAGNOSIS — Z79899 Other long term (current) drug therapy: Secondary | ICD-10-CM | POA: Diagnosis not present

## 2015-04-19 DIAGNOSIS — F418 Other specified anxiety disorders: Secondary | ICD-10-CM | POA: Diagnosis not present

## 2015-04-19 DIAGNOSIS — Q438 Other specified congenital malformations of intestine: Secondary | ICD-10-CM | POA: Insufficient documentation

## 2015-04-19 DIAGNOSIS — Z1211 Encounter for screening for malignant neoplasm of colon: Secondary | ICD-10-CM | POA: Insufficient documentation

## 2015-04-19 DIAGNOSIS — I10 Essential (primary) hypertension: Secondary | ICD-10-CM | POA: Insufficient documentation

## 2015-04-19 DIAGNOSIS — Z21 Asymptomatic human immunodeficiency virus [HIV] infection status: Secondary | ICD-10-CM | POA: Insufficient documentation

## 2015-04-19 DIAGNOSIS — Z800 Family history of malignant neoplasm of digestive organs: Secondary | ICD-10-CM | POA: Diagnosis not present

## 2015-04-19 DIAGNOSIS — Z86010 Personal history of colonic polyps: Secondary | ICD-10-CM

## 2015-04-19 DIAGNOSIS — D122 Benign neoplasm of ascending colon: Secondary | ICD-10-CM | POA: Diagnosis not present

## 2015-04-19 HISTORY — PX: POLYPECTOMY: SHX5525

## 2015-04-19 HISTORY — PX: COLONOSCOPY WITH PROPOFOL: SHX5780

## 2015-04-19 SURGERY — COLONOSCOPY WITH PROPOFOL
Anesthesia: Monitor Anesthesia Care

## 2015-04-19 MED ORDER — ONDANSETRON HCL 4 MG/2ML IJ SOLN
INTRAMUSCULAR | Status: AC
  Filled 2015-04-19: qty 2

## 2015-04-19 MED ORDER — MIDAZOLAM HCL 2 MG/2ML IJ SOLN
INTRAMUSCULAR | Status: AC
  Filled 2015-04-19: qty 4

## 2015-04-19 MED ORDER — ONDANSETRON HCL 4 MG/2ML IJ SOLN: 4.0000 mg | Freq: Once | INTRAMUSCULAR | Status: DC | PRN

## 2015-04-19 MED ORDER — FENTANYL CITRATE (PF) 100 MCG/2ML IJ SOLN: 25.0000 ug | INTRAMUSCULAR | Status: DC | PRN

## 2015-04-19 MED ORDER — FENTANYL CITRATE (PF) 100 MCG/2ML IJ SOLN
25.0000 ug | INTRAMUSCULAR | Status: AC
  Administered 2015-04-19 (×2): 25 ug via INTRAVENOUS

## 2015-04-19 MED ORDER — GLYCOPYRROLATE 0.2 MG/ML IJ SOLN
INTRAMUSCULAR | Status: AC
  Filled 2015-04-19: qty 1

## 2015-04-19 MED ORDER — LACTATED RINGERS IV SOLN
INTRAVENOUS | Status: DC
  Administered 2015-04-19: 13:00:00 via INTRAVENOUS

## 2015-04-19 MED ORDER — STERILE WATER FOR IRRIGATION IR SOLN
Status: DC | PRN
  Administered 2015-04-19: 1000 mL

## 2015-04-19 MED ORDER — FENTANYL CITRATE (PF) 100 MCG/2ML IJ SOLN
INTRAMUSCULAR | Status: AC
  Filled 2015-04-19: qty 2

## 2015-04-19 MED ORDER — ONDANSETRON HCL 4 MG/2ML IJ SOLN
4.0000 mg | Freq: Once | INTRAMUSCULAR | Status: AC
  Administered 2015-04-19: 4 mg via INTRAVENOUS

## 2015-04-19 MED ORDER — MIDAZOLAM HCL 2 MG/2ML IJ SOLN
INTRAMUSCULAR | Status: AC
  Filled 2015-04-19: qty 2

## 2015-04-19 MED ORDER — PROPOFOL 500 MG/50ML IV EMUL
INTRAVENOUS | Status: DC | PRN
  Administered 2015-04-19: 100 ug/kg/min via INTRAVENOUS
  Administered 2015-04-19: 15:00:00 via INTRAVENOUS

## 2015-04-19 MED ORDER — MIDAZOLAM HCL 5 MG/5ML IJ SOLN
INTRAMUSCULAR | Status: DC | PRN
  Administered 2015-04-19: 2 mg via INTRAVENOUS

## 2015-04-19 MED ORDER — GLYCOPYRROLATE 0.2 MG/ML IJ SOLN
INTRAMUSCULAR | Status: DC | PRN
  Administered 2015-04-19: 0.2 mg via INTRAVENOUS

## 2015-04-19 MED ORDER — GLYCOPYRROLATE 0.2 MG/ML IJ SOLN
0.2000 mg | Freq: Once | INTRAMUSCULAR | Status: AC
  Administered 2015-04-19: 0.2 mg via INTRAVENOUS

## 2015-04-19 MED ORDER — MIDAZOLAM HCL 2 MG/2ML IJ SOLN
1.0000 mg | INTRAMUSCULAR | Status: DC | PRN
  Administered 2015-04-19 (×2): 2 mg via INTRAVENOUS

## 2015-04-19 SURGICAL SUPPLY — 25 items
AMPLIFEYE LARGE BLUE (MISCELLANEOUS) ×1 IMPLANT
DEVICE CLIP HEMOSTAT 235CM (CLIP) ×1 IMPLANT
ELECT REM PT RETURN 9FT ADLT (ELECTROSURGICAL) ×2
ELECTRODE REM PT RTRN 9FT ADLT (ELECTROSURGICAL) IMPLANT
FCP BXJMBJMB 240X2.8X (CUTTING FORCEPS)
FLOOR PAD 36X40 (MISCELLANEOUS) ×2
FORCEPS BIOP RAD 4 LRG CAP 4 (CUTTING FORCEPS) IMPLANT
FORCEPS BIOP RJ4 240 W/NDL (CUTTING FORCEPS)
FORCEPS BXJMBJMB 240X2.8X (CUTTING FORCEPS) IMPLANT
FORMALIN 10 PREFIL 20ML (MISCELLANEOUS) ×1 IMPLANT
INJECTOR/SNARE I SNARE (MISCELLANEOUS) IMPLANT
KIT ENDO PROCEDURE PEN (KITS) ×2 IMPLANT
MANIFOLD NEPTUNE II (INSTRUMENTS) ×1 IMPLANT
NDL SCLEROTHERAPY 25GX240 (NEEDLE) IMPLANT
NEEDLE SCLEROTHERAPY 25GX240 (NEEDLE) IMPLANT
OVERTUBE ENDO DISP 25CM (MISCELLANEOUS) ×1 IMPLANT
PAD FLOOR 36X40 (MISCELLANEOUS) ×1 IMPLANT
PROBE APC STR FIRE (PROBE) IMPLANT
PROBE INJECTION GOLD (MISCELLANEOUS)
PROBE INJECTION GOLD 7FR (MISCELLANEOUS) IMPLANT
SNARE ROTATE MED OVAL 20MM (MISCELLANEOUS) ×1 IMPLANT
SNARE SHORT THROW 13M SML OVAL (MISCELLANEOUS) ×1 IMPLANT
TRAP SPECIMEN MUCOUS 40CC (MISCELLANEOUS) ×1 IMPLANT
TUBING IRRIGATION ENDOGATOR (MISCELLANEOUS) ×1 IMPLANT
WATER STERILE IRR 1000ML POUR (IV SOLUTION) ×1 IMPLANT

## 2015-04-19 NOTE — Op Note (Signed)
[facility] [address] [place], [address]   COLONOSCOPY PROCEDURE REPORT  PATIENT: Kerry Solis, Kerry Solis  MR#: [id number] BIRTHDATE: Feb 01, 1946 , 69  yrs. old GENDER: male ENDOSCOPIST: Danie Binder, MD REFERRED [id number] [name], M.D. PROCEDURE DATE:  2015-05-06 PROCEDURE:   Colonoscopy with snare polypectomy INDICATIONS:high risk patient with personal history of colonic polyps and patient's immediate family history of colon cancer. MEDICATIONS: Monitored anesthesia care  DESCRIPTION OF PROCEDURE:    Physical exam was performed.  Informed consent was obtained from the patient after explaining the benefits, risks, and alternatives to procedure.  The patient was connected to monitor and placed in left lateral position. Continuous oxygen was provided by nasal cannula and IV medicine administered through an indwelling cannula.  After administration of sedation and rectal exam, the patients rectum was intubated and the     colonoscope was advanced under direct visualization to the cecum.  The scope was removed slowly by carefully examining the color, texture, anatomy, and integrity mucosa on the way out.  The patient was recovered in endoscopy and discharged home in satisfactory condition. Estimated blood loss is zero unless otherwise noted in this procedure report.    COLON FINDINGS: The colon was redundant and AN OVERTUBE WAS USED TO REACH THE CECUM. A sessile polyp measuring 6 mm in size was found in the ascending colon.  A polypectomy was performed using snare cautery.  PREP QUALITY: excellent.  CECAL W/D TIME: 22       minutes  COMPLICATIONS: None  ENDOSCOPIC IMPRESSION: 1.   The colon was redundant 2.   ONE COLON polyp REMOVED  RECOMMENDATIONS: AWAIT BIOPSY NO MRI FOR 30 DAYS HIGH FIBER DIET NEXT TCS IN 5-10 YEARS WITH AN OVERTUBE.      _______________________________ eSignedDanie Binder, MD 06-May-2015 9:56 PM    CPT CODES: ICD  CODES:  The ICD and CPT codes recommended by this software are interpretations from the data that the clinical staff has captured with the software.  The verification of the translation of this report to the ICD and CPT codes and modifiers is the sole responsibility of the health care institution and practicing physician where this report was generated.  [employer] will not be held responsible for the validity of the ICD and CPT codes included on this report.  AMA assumes no liability for data contained or not contained herein. CPT is a Designer, television/film set of the [employer].

## 2015-04-19 NOTE — Transfer of Care (Signed)
Immediate Anesthesia Transfer of Care Note  Patient: Kerry Solis  Procedure(s) Performed: Procedure(s) with comments: COLONOSCOPY WITH PROPOFOL (N/A) - cecum time in 1434     71min  total withdrawal time POLYPECTOMY (N/A) - ascending colon  Patient Location: PACU  Anesthesia Type:MAC  Level of Consciousness: awake and patient cooperative  Airway & Oxygen Therapy: Patient Spontanous Breathing and Patient connected to face mask oxygen  Post-op Assessment: Report given to RN and Post -op Vital signs reviewed and stable  Post vital signs: Reviewed and stable  Last Vitals:  Filed Vitals:   04/19/15 1340  BP: 128/72  Pulse:   Temp:   Resp: 21    Complications: No apparent anesthesia complications

## 2015-04-19 NOTE — H&P (Signed)
Primary Care Physician:  Sallee Lange, MD Primary Gastroenterologist:  Dr. Oneida Alar  Pre-Procedure History & Physical: HPI:  Kerry Solis is a 69 y.o. male here for PERSONAL HISTORY OF POLYPS/FAMILY Hx COLON CA-BROTHERS HAD COLON CA.  Past Medical History  Diagnosis Date  . Multiple sclerosis ([place])   . Hypertension   . HIV (human immunodeficiency virus infection) ([place])   . Leukopenia     Low CD4  . Cryptococcal meningitis ([place])   . Elevated liver enzymes   . High triglycerides   . Glaucoma   . Myocardial infarction ([place])     45 years ago  . Anxiety   . Depression   . Arthritis   . History of gout     Past Surgical History  Procedure Laterality Date  . Cholecystectomy  06/10/2011    Procedure: LAPAROSCOPIC CHOLECYSTECTOMY;  Surgeon: Donato Heinz, MD;  Location: [facility];  Service: General;  Laterality: N/A;  . Neck surgery      disc  . Esophagogastroduodenoscopy  06/23/2008    IOM:BTDHRCB gastritis/schatzki ring  . Colonoscopy  03/10/2009    ULA:GTXMIWOE internal hemorrhoids/6-mm sessile ascending colon polyp/tortuous colon, diverticulosis. TA    Prior to Admission medications   Medication Sig Start Date End Date Taking? Authorizing Provider  acetaminophen (TYLENOL) 500 MG tablet Take 1,000 mg by mouth 3 (three) times daily as needed for mild pain or moderate pain.   Yes Historical Provider, MD  allopurinol (ZYLOPRIM) 300 MG tablet TAKE (1) TABLET BY MOUTH ONCE DAILY FOR GOUT. 12/22/14  Yes Kathyrn Drown, MD  amitriptyline (ELAVIL) 25 MG tablet TK 2 TS PO AT NIGHT 12/11/14  Yes Historical Provider, MD  amLODipine (NORVASC) 10 MG tablet TAKE (1) TABLET BY MOUTH DAILY FOR HIGH BLOOD PRESSURE. 02/22/15  Yes Kathyrn Drown, MD  ATRIPLA 600-200-300 MG per tablet TK 1 T PO  NIGHTLY 12/11/14  Yes Historical Provider, MD  DESCOVY 200-25 MG tablet TK 1 T PO QD 04/07/15  Yes Historical Provider, MD  dolutegravir (TIVICAY) 50 MG tablet Take by mouth. 03/30/15 03/29/16 Yes Historical  Provider, MD  interferon beta-1b (BETASERON) 0.3 MG injection Inject 44 mg into the skin every Monday, Wednesday, and Friday.     Yes Historical Provider, MD  latanoprost (XALATAN) 0.005 % ophthalmic solution Place 1 drop into the left eye at bedtime.  07/15/14  Yes Historical Provider, MD  lisinopril-hydrochlorothiazide (PRINZIDE,ZESTORETIC) 10-12.5 MG per tablet TAKE (1) TABLET BY MOUTH DAILY FOR HIGH BLOOD PRESSURE. 01/21/15  Yes Kathyrn Drown, MD  Na Sulfate-K Sulfate-Mg Sulf (SUPREP BOWEL PREP) SOLN Take 1 kit by mouth as directed. 03/31/15  Yes Danie Binder, MD  oxyCODONE-acetaminophen (PERCOCET/ROXICET) 5-325 MG per tablet Take 1 tablet by mouth every 4 (four) hours as needed for severe pain. 10/18/14  Yes Kathyrn Drown, MD  pravastatin (PRAVACHOL) 40 MG tablet TAKE 1 TABLET BY MOUTH AT BEDTIME FOR CHOLESTEROL 12/22/14  Yes Kathyrn Drown, MD  sertraline (ZOLOFT) 50 MG tablet Take 1 tablet (50 mg total) by mouth daily. 03/08/15  Yes Kathyrn Drown, MD  albuterol (PROVENTIL HFA;VENTOLIN HFA) 108 (90 BASE) MCG/ACT inhaler Inhale 2 puffs into the lungs every 6 (six) hours as needed for wheezing. 01/13/13   Kathyrn Drown, MD    Allergies as of 03/31/2015  . (No Known Allergies)    Family History  Problem Relation Age of Onset  . Hypertension Mother   . Heart attack Mother   . Heart attack Father   .  Cancer Sister     Breast  . Diabetes Sister   . Cancer Brother     Colon  . Diabetes Brother   . Cancer Brother     colon    Social History   Social History  . Marital Status: Divorced    Spouse Name: N/A  . Number of Children: N/A  . Years of Education: N/A   Occupational History  . Not on file.   Social History Main Topics  . Smoking status: Never Smoker   . Smokeless tobacco: Never Used  . Alcohol Use: No  . Drug Use: No  . Sexual Activity: No   Other Topics Concern  . Not on file   Social History Narrative    Review of Systems: See HPI, otherwise negative  ROS   Physical Exam: BP 128/72 mmHg  Pulse 91  Temp(Src) 97.9 F (36.6 C) (Oral)  Resp 21  SpO2 95% General:   Alert,  pleasant and cooperative in NAD Head:  Normocephalic and atraumatic. Neck:  Supple; Lungs:  Clear throughout to auscultation.    Heart:  Regular rate and rhythm. Abdomen:  Soft, nontender and nondistended. Normal bowel sounds, without guarding, and without rebound.   Neurologic:  Alert and  oriented x4;  grossly normal neurologically.  Impression/Plan:    PERSONAL HISTORY OF POLYPS/FAMILY Hx COLON CA-BROTHERS HAD COLON CA.  PLAN: 1. TCS TODAY

## 2015-04-19 NOTE — Anesthesia Postprocedure Evaluation (Signed)
  Anesthesia Post-op Note  Patient: Kerry Solis  Procedure(s) Performed: Procedure(s) with comments: COLONOSCOPY WITH PROPOFOL (N/A) - cecum time in 1434     45min  total withdrawal time POLYPECTOMY (N/A) - ascending colon  Patient Location: PACU  Anesthesia Type:MAC  Level of Consciousness: awake, alert , oriented and patient cooperative  Airway and Oxygen Therapy: Patient Spontanous Breathing  Post-op Pain: none  Post-op Assessment: Post-op Vital signs reviewed, Patient's Cardiovascular Status Stable, Respiratory Function Stable, Patent Airway and Pain level controlled              Post-op Vital Signs: Reviewed and stable  Last Vitals:  Filed Vitals:   04/19/15 1510  BP: 120/63  Pulse: 88  Temp: 36.6 C  Resp: 22    Complications: No apparent anesthesia complications

## 2015-04-19 NOTE — Anesthesia Preprocedure Evaluation (Signed)
Anesthesia Evaluation  Patient identified by MRN, date of birth, ID band Patient awake    Reviewed: Allergy & Precautions, NPO status , Patient's Chart, lab work & pertinent test results  History of Anesthesia Complications (+) history of anesthetic complications (hx failed conscious sedation for TCS)  Airway Mallampati: II  TM Distance: >3 FB     Dental  (+) Poor Dentition, Chipped   Pulmonary    breath sounds clear to auscultation       Cardiovascular hypertension, Pt. on medications + CAD and + Past MI   Rhythm:Regular Rate:Normal     Neuro/Psych PSYCHIATRIC DISORDERS Anxiety Depression  Neuromuscular disease (hx MS)    GI/Hepatic GERD  Medicated and Controlled,  Endo/Other    Renal/GU      Musculoskeletal   Abdominal   Peds  Hematology   Anesthesia Other Findings   Reproductive/Obstetrics                             Anesthesia Physical Anesthesia Plan  ASA: III  Anesthesia Plan: MAC   Post-op Pain Management:    Induction: Intravenous  Airway Management Planned: Simple Face Mask  Additional Equipment:   Intra-op Plan:   Post-operative Plan:   Informed Consent: I have reviewed the patients History and Physical, chart, labs and discussed the procedure including the risks, benefits and alternatives for the proposed anesthesia with the patient or authorized representative who has indicated his/her understanding and acceptance.     Plan Discussed with:   Anesthesia Plan Comments:         Anesthesia Quick Evaluation  

## 2015-04-19 NOTE — Discharge Instructions (Signed)
You had 1 polyp removed FROM YOUR RIGHT COLON. I PLACED A CLIP TO PREVENT BLEEDING IN 7-10 DAYS. You have DIVERTICULOSIS IN YOUR LEFT COLON AND MODERATE internal AND EXTERNAL hemorrhoids.   FOLLOW A HIGH FIBER DIET. AVOID ITEMS THAT CAUSE BLOATING & GAS. SEE INFO BELOW.  YOUR BIOPSY RESULTS WILL BE AVAILABLE IN MY CHART AFTER NOV 3 AND MY OFFICE WILL CONTACT YOU IN 10-14 DAYS WITH YOUR RESULTS.    Next colonoscopy in 5-10 years.    Colonoscopy Care After Read the instructions outlined below and refer to this sheet in the next week. These discharge instructions provide you with general information on caring for yourself after you leave the hospital. While your treatment has been planned according to the most current medical practices available, unavoidable complications occasionally occur. If you have any problems or questions after discharge, call DR. Telsa Dillavou, [phone].  ACTIVITY  You may resume your regular activity, but move at a slower pace for the next 24 hours.   Take frequent rest periods for the next 24 hours.   Walking will help get rid of the air and reduce the bloated feeling in your belly (abdomen).   No driving for 24 hours (because of the medicine (anesthesia) used during the test).   You may shower.   Do not sign any important legal documents or operate any machinery for 24 hours (because of the anesthesia used during the test).    NUTRITION  Drink plenty of fluids.   You may resume your normal diet as instructed by your doctor.   Begin with a light meal and progress to your normal diet. Heavy or fried foods are harder to digest and may make you feel sick to your stomach (nauseated).   Avoid alcoholic beverages for 24 hours or as instructed.    MEDICATIONS  You may resume your normal medications.   WHAT YOU CAN EXPECT TODAY  Some feelings of bloating in the abdomen.   Passage of more gas than usual.   Spotting of blood in your stool or on the  toilet paper  .  IF YOU HAD POLYPS REMOVED DURING THE COLONOSCOPY:  Eat a soft diet IF YOU HAVE NAUSEA, BLOATING, ABDOMINAL PAIN, OR VOMITING.    FINDING OUT THE RESULTS OF YOUR TEST Not all test results are available during your visit. DR. Oneida Alar WILL CALL YOU WITHIN 14 DAYS OF YOUR PROCEDUE WITH YOUR RESULTS. Do not assume everything is normal if you have not heard from DR. Earon Rivest,, CALL HER OFFICE AT [phone].  SEEK IMMEDIATE MEDICAL ATTENTION AND CALL THE OFFICE: [phone] IF:  You have more than a spotting of blood in your stool.   Your belly is swollen (abdominal distention).   You are nauseated or vomiting.   You have a temperature over 101F.   You have abdominal pain or discomfort that is severe or gets worse throughout the day.   High-Fiber Diet A high-fiber diet changes your normal diet to include more whole grains, legumes, fruits, and vegetables. Changes in the diet involve replacing refined carbohydrates with unrefined foods. The calorie level of the diet is essentially unchanged. The Dietary Reference Intake (recommended amount) for adult males is 38 grams per day. For adult females, it is 25 grams per day. Pregnant and lactating women should consume 28 grams of fiber per day. Fiber is the intact part of a plant that is not broken down during digestion. Functional fiber is fiber that has been isolated from the plant to provide  a beneficial effect in the body. PURPOSE  Increase stool bulk.   Ease and regulate bowel movements.   Lower cholesterol.  REDUCE RISK OF COLON CANCER  INDICATIONS THAT YOU NEED MORE FIBER  Constipation and hemorrhoids.   Uncomplicated diverticulosis (intestine condition) and irritable bowel syndrome.   Weight management.   As a protective measure against hardening of the arteries (atherosclerosis), diabetes, and cancer.   GUIDELINES FOR INCREASING FIBER IN THE DIET  Start adding fiber to the diet slowly. A gradual increase of  about 5 more grams (2 slices of whole-wheat bread, 2 servings of most fruits or vegetables, or 1 bowl of high-fiber cereal) per day is best. Too rapid an increase in fiber may result in constipation, flatulence, and bloating.   Drink enough water and fluids to keep your urine clear or pale yellow. Water, juice, or caffeine-free drinks are recommended. Not drinking enough fluid may cause constipation.   Eat a variety of high-fiber foods rather than one type of fiber.   Try to increase your intake of fiber through using high-fiber foods rather than fiber pills or supplements that contain small amounts of fiber.   The goal is to change the types of food eaten. Do not supplement your present diet with high-fiber foods, but replace foods in your present diet.   INCLUDE A VARIETY OF FIBER SOURCES  Replace refined and processed grains with whole grains, canned fruits with fresh fruits, and incorporate other fiber sources. White rice, white breads, and most bakery goods contain little or no fiber.   Brown whole-grain rice, buckwheat oats, and many fruits and vegetables are all good sources of fiber. These include: broccoli, Brussels sprouts, cabbage, cauliflower, beets, sweet potatoes, white potatoes (skin on), carrots, tomatoes, eggplant, squash, berries, fresh fruits, and dried fruits.   Cereals appear to be the richest source of fiber. Cereal fiber is found in whole grains and bran. Bran is the fiber-rich outer coat of cereal grain, which is largely removed in refining. In whole-grain cereals, the bran remains. In breakfast cereals, the largest amount of fiber is found in those with "bran" in their names. The fiber content is sometimes indicated on the label.   You may need to include additional fruits and vegetables each day.   In baking, for 1 cup white flour, you may use the following substitutions:   1 cup whole-wheat flour minus 2 tablespoons.   1/2 cup white flour plus 1/2 cup whole-wheat  flour.   Polyps, Colon  A polyp is extra tissue that grows inside your body. Colon polyps grow in the large intestine. The large intestine, also called the colon, is part of your digestive system. It is a long, hollow tube at the end of your digestive tract where your body makes and stores stool. Most polyps are not dangerous. They are benign. This means they are not cancerous. But over time, some types of polyps can turn into cancer. Polyps that are smaller than a pea are usually not harmful. But larger polyps could someday become or may already be cancerous. To be safe, doctors remove all polyps and test them.   PREVENTION There is not one sure way to prevent polyps. You might be able to lower your risk of getting them if you:  Eat more fruits and vegetables and less fatty food.   Do not smoke.   Avoid alcohol.   Exercise every day.   Lose weight if you are overweight.   Eating more calcium and folate can  also lower your risk of getting polyps. Some foods that are rich in calcium are milk, cheese, and broccoli. Some foods that are rich in folate are chickpeas, kidney beans, and spinach.   Hemorrhoids Hemorrhoids are dilated (enlarged) veins around the rectum. Sometimes clots will form in the veins. This makes them swollen and painful. These are called thrombosed hemorrhoids. Causes of hemorrhoids include:  Constipation.   Straining to have a bowel movement.   HEAVY LIFTING  HOME CARE INSTRUCTIONS  Eat a well balanced diet and drink 6 to 8 glasses of water every day to avoid constipation. You may also use a bulk laxative.   Avoid straining to have bowel movements.   Keep anal area dry and clean.   Do not use a donut shaped pillow or sit on the toilet for long periods. This increases blood pooling and pain.   Move your bowels when your body has the urge; this will require less straining and will decrease pain and pressure.

## 2015-04-19 NOTE — Progress Notes (Signed)
REVIEWED-NO ADDITIONAL RECOMMENDATIONS. 

## 2015-04-20 ENCOUNTER — Encounter (HOSPITAL_COMMUNITY): Payer: Self-pay | Admitting: Gastroenterology

## 2015-04-22 ENCOUNTER — Telehealth: Payer: Self-pay | Admitting: Gastroenterology

## 2015-04-22 NOTE — Telephone Encounter (Signed)
Please call pt. He had A simple adenoma removed from hIS colon. HIS LABS SHOW A STABLE BLOOD COUNT AT 12.8. NORMAL IS Hb 13.   FOLLOW A HIGH FIBER DIET. AVOID ITEMS THAT CAUSE BLOATING & GAS.  HE NEEDS A FERRITIN WITHIN THE NEXT 7 DAYS AND A CBC/FERRITIN 1 WEEK PRIOR TO NEXT VISIT. FOLLOW UP IN 3 MOS E15 ANEMIA. Next colonoscopy in 5-10 years.

## 2015-04-25 ENCOUNTER — Other Ambulatory Visit: Payer: Self-pay

## 2015-04-25 DIAGNOSIS — D649 Anemia, unspecified: Secondary | ICD-10-CM

## 2015-04-25 NOTE — Telephone Encounter (Signed)
PT is aware. Lab order faxed to [facility] to do within next 7 days and the other lab orders on file for Feb 2017.

## 2015-04-25 NOTE — Telephone Encounter (Signed)
ON RECALL  °

## 2015-04-25 NOTE — Telephone Encounter (Signed)
Routing to DS 

## 2015-04-27 ENCOUNTER — Other Ambulatory Visit: Payer: Self-pay | Admitting: Family Medicine

## 2015-04-27 DIAGNOSIS — D649 Anemia, unspecified: Secondary | ICD-10-CM | POA: Diagnosis not present

## 2015-04-27 LAB — FERRITIN: Ferritin: 190 ng/mL (ref 22–322)

## 2015-04-29 ENCOUNTER — Other Ambulatory Visit (HOSPITAL_COMMUNITY): Payer: Medicare Other

## 2015-04-30 NOTE — Telephone Encounter (Signed)
PLEASE CALL PT. HIS FERRITIN IS 190. HIS IRON STORES ARE NORMAL.

## 2015-05-02 NOTE — Telephone Encounter (Signed)
Pt is aware.  

## 2015-05-18 DIAGNOSIS — H40001 Preglaucoma, unspecified, right eye: Secondary | ICD-10-CM | POA: Diagnosis not present

## 2015-05-18 DIAGNOSIS — H401124 Primary open-angle glaucoma, left eye, indeterminate stage: Secondary | ICD-10-CM | POA: Diagnosis not present

## 2015-05-31 ENCOUNTER — Ambulatory Visit (INDEPENDENT_AMBULATORY_CARE_PROVIDER_SITE_OTHER): Payer: Medicare Other | Admitting: Family Medicine

## 2015-05-31 ENCOUNTER — Encounter: Payer: Self-pay | Admitting: Family Medicine

## 2015-05-31 VITALS — BP 130/80 | Temp 98.4°F | Ht 68.5 in | Wt 217.0 lb

## 2015-05-31 DIAGNOSIS — M791 Myalgia: Secondary | ICD-10-CM | POA: Diagnosis not present

## 2015-05-31 DIAGNOSIS — M609 Myositis, unspecified: Secondary | ICD-10-CM

## 2015-05-31 DIAGNOSIS — IMO0001 Reserved for inherently not codable concepts without codable children: Secondary | ICD-10-CM

## 2015-05-31 MED ORDER — OXYCODONE-ACETAMINOPHEN 5-325 MG PO TABS: 1.0000 | ORAL_TABLET | ORAL | Status: DC | PRN

## 2015-05-31 NOTE — Progress Notes (Signed)
   Subjective:    Patient ID: Kerry Solis, male    DOB: 02/26/1946, 69 y.o.   MRN: [id number]  HPI Patient is here today because he is having joint pain all over his body. Onset was about 2 months ago. No other symptoms noted.  Patient has no other concerns at this time.  He denies high fever chills he denies sweats cough wheezing difficulty breathing he relates muscle soreness and joint soreness present over the past several days since starting new medications for his HIV infection. Denies any rashes. Review of Systems    see above. Objective:   Physical Exam Significant for subjective tenderness in the joints and some in the muscles lungs are clear hearts regular no rash noted neck no masses no edema       Assessment & Plan:  Myalgias could be related to the knee medication he was just started on by his infectious disease expert. I do not find any evidence of any underlying infection I recommend that we do some lab work in based upon this may need to have patient speak with the specialists about changing his medication. I doubt that this is due to pravastatin but currently lives stop this medication until we see how he is doing

## 2015-06-01 LAB — BASIC METABOLIC PANEL
BUN/Creatinine Ratio: 10 (ref 10–22)
BUN: 15 mg/dL (ref 8–27)
CO2: 23 mmol/L (ref 18–29)
Calcium: 10 mg/dL (ref 8.6–10.2)
Chloride: 104 mmol/L (ref 96–106)
Creatinine, Ser: 1.44 mg/dL — ABNORMAL HIGH (ref 0.76–1.27)
GFR calc Af Amer: 56 mL/min/{1.73_m2} — ABNORMAL LOW (ref >59)
GFR calc non Af Amer: 49 mL/min/{1.73_m2} — ABNORMAL LOW (ref >59)
Glucose: 93 mg/dL (ref 65–99)
Potassium: 4.6 mmol/L (ref 3.5–5.2)
Sodium: 145 mmol/L — ABNORMAL HIGH (ref 134–144)

## 2015-06-01 LAB — CBC WITH DIFFERENTIAL/PLATELET
Basophils Absolute: 0 10*3/uL (ref 0.0–0.2)
Basos: 0 %
EOS (ABSOLUTE): 0.1 10*3/uL (ref 0.0–0.4)
Eos: 1 %
Hematocrit: 38.7 % (ref 37.5–51.0)
Hemoglobin: 13.1 g/dL (ref 12.6–17.7)
Immature Grans (Abs): 0 10*3/uL (ref 0.0–0.1)
Immature Granulocytes: 0 %
Lymphocytes Absolute: 1.6 10*3/uL (ref 0.7–3.1)
Lymphs: 37 %
MCH: 29.8 pg (ref 26.6–33.0)
MCHC: 33.9 g/dL (ref 31.5–35.7)
MCV: 88 fL (ref 79–97)
Monocytes Absolute: 0.5 10*3/uL (ref 0.1–0.9)
Monocytes: 12 %
Neutrophils Absolute: 2.2 10*3/uL (ref 1.4–7.0)
Neutrophils: 50 %
Platelets: 220 10*3/uL (ref 150–379)
RBC: 4.39 x10E6/uL (ref 4.14–5.80)
RDW: 15.1 % (ref 12.3–15.4)
WBC: 4.5 10*3/uL (ref 3.4–10.8)

## 2015-06-01 LAB — HEPATIC FUNCTION PANEL
ALT: 75 IU/L — ABNORMAL HIGH (ref 0–44)
AST: 86 IU/L — ABNORMAL HIGH (ref 0–40)
Albumin: 4.6 g/dL (ref 3.5–4.8)
Alkaline Phosphatase: 70 IU/L (ref 39–117)
Bilirubin Total: 0.3 mg/dL (ref 0.0–1.2)
Bilirubin, Direct: 0.12 mg/dL (ref 0.00–0.40)
Total Protein: 7.5 g/dL (ref 6.0–8.5)

## 2015-06-01 LAB — SEDIMENTATION RATE: Sed Rate: 6 mm/hr (ref 0–30)

## 2015-06-01 LAB — CK: Total CK: 290 U/L — ABNORMAL HIGH (ref 24–204)

## 2015-06-01 NOTE — Addendum Note (Signed)
Addended by: Ofilia Neas R on: 06/01/2015 10:15 AM   Modules accepted: Orders

## 2015-06-06 ENCOUNTER — Encounter: Payer: Self-pay | Admitting: Family Medicine

## 2015-06-06 ENCOUNTER — Ambulatory Visit (INDEPENDENT_AMBULATORY_CARE_PROVIDER_SITE_OTHER): Payer: Medicare Other | Admitting: Family Medicine

## 2015-06-06 VITALS — BP 134/68 | Ht 68.5 in | Wt 215.1 lb

## 2015-06-06 DIAGNOSIS — E785 Hyperlipidemia, unspecified: Secondary | ICD-10-CM | POA: Diagnosis not present

## 2015-06-06 DIAGNOSIS — R7303 Prediabetes: Secondary | ICD-10-CM | POA: Diagnosis not present

## 2015-06-06 DIAGNOSIS — R739 Hyperglycemia, unspecified: Secondary | ICD-10-CM | POA: Diagnosis not present

## 2015-06-06 LAB — POCT GLYCOSYLATED HEMOGLOBIN (HGB A1C): Hemoglobin A1C: 6.5

## 2015-06-06 NOTE — Progress Notes (Signed)
   Subjective:    Patient ID: Kerry Solis, male    DOB: 10-21-1945, 69 y.o.   MRN: [id number]  Hypertension This is a chronic problem. The current episode started more than 1 year ago. Pertinent negatives include no chest pain. There are no compliance problems.    Patient states no other concerns this visit. Recent labs drawn on 06/01/15.  Results for orders placed or performed in visit on 06/06/15  POCT HgB A1C  Result Value Ref Range   Hemoglobin A1C 6.5     Review of Systems  Constitutional: Negative for activity change, appetite change and fatigue.  HENT: Negative for congestion.   Respiratory: Negative for cough.   Cardiovascular: Negative for chest pain.  Gastrointestinal: Negative for abdominal pain.  Endocrine: Negative for polydipsia and polyphagia.  Neurological: Negative for weakness.  Psychiatric/Behavioral: Negative for confusion.       Objective:   Physical Exam  Constitutional: He appears well-nourished. No distress.  Cardiovascular: Normal rate, regular rhythm and normal heart sounds.   No murmur heard. Pulmonary/Chest: Effort normal and breath sounds normal. No respiratory distress.  Musculoskeletal: He exhibits no edema.  Lymphadenopathy:    He has no cervical adenopathy.  Neurological: He is alert.  Psychiatric: His behavior is normal.  Vitals reviewed.         Assessment & Plan:  Blood pressure good continue current measures A1c acceptable at 6.5 watch diet increase activity try to bring weight down repeated again in the spring Recent myalgias disappeared probably due to statins repeat lab work in a few weeks

## 2015-06-23 DIAGNOSIS — M609 Myositis, unspecified: Secondary | ICD-10-CM | POA: Diagnosis not present

## 2015-06-23 DIAGNOSIS — M791 Myalgia: Secondary | ICD-10-CM | POA: Diagnosis not present

## 2015-06-24 ENCOUNTER — Other Ambulatory Visit: Payer: Self-pay | Admitting: Family Medicine

## 2015-06-24 ENCOUNTER — Other Ambulatory Visit: Payer: Self-pay | Admitting: *Deleted

## 2015-06-24 ENCOUNTER — Other Ambulatory Visit: Payer: Self-pay

## 2015-06-24 ENCOUNTER — Encounter: Payer: Self-pay | Admitting: Family Medicine

## 2015-06-24 DIAGNOSIS — D649 Anemia, unspecified: Secondary | ICD-10-CM

## 2015-06-24 DIAGNOSIS — K760 Fatty (change of) liver, not elsewhere classified: Secondary | ICD-10-CM | POA: Insufficient documentation

## 2015-06-24 DIAGNOSIS — R748 Abnormal levels of other serum enzymes: Secondary | ICD-10-CM

## 2015-06-24 LAB — HEPATIC FUNCTION PANEL
ALT: 58 IU/L — ABNORMAL HIGH (ref 0–44)
AST: 55 IU/L — ABNORMAL HIGH (ref 0–40)
Albumin: 4.6 g/dL (ref 3.5–4.8)
Alkaline Phosphatase: 68 IU/L (ref 39–117)
Bilirubin Total: 0.3 mg/dL (ref 0.0–1.2)
Bilirubin, Direct: 0.11 mg/dL (ref 0.00–0.40)
Total Protein: 7.8 g/dL (ref 6.0–8.5)

## 2015-06-24 LAB — CK: Total CK: 230 U/L — ABNORMAL HIGH (ref 24–204)

## 2015-06-24 MED ORDER — SERTRALINE HCL 50 MG PO TABS: 50.0000 mg | ORAL_TABLET | Freq: Every day | ORAL | Status: DC

## 2015-06-24 MED ORDER — ALLOPURINOL 300 MG PO TABS: ORAL_TABLET | ORAL | Status: DC

## 2015-07-04 ENCOUNTER — Encounter: Payer: Self-pay | Admitting: Gastroenterology

## 2015-07-25 ENCOUNTER — Other Ambulatory Visit: Payer: Self-pay | Admitting: Family Medicine

## 2015-07-26 DIAGNOSIS — D649 Anemia, unspecified: Secondary | ICD-10-CM | POA: Diagnosis not present

## 2015-07-26 LAB — CBC WITH DIFFERENTIAL/PLATELET
Basophils Absolute: 0 10*3/uL (ref 0.0–0.1)
Basophils Relative: 0 % (ref 0–1)
Eosinophils Absolute: 0.1 10*3/uL (ref 0.0–0.7)
Eosinophils Relative: 2 % (ref 0–5)
HCT: 41.2 % (ref 39.0–52.0)
Hemoglobin: 13.5 g/dL (ref 13.0–17.0)
Lymphocytes Relative: 41 % (ref 12–46)
Lymphs Abs: 1.1 10*3/uL (ref 0.7–4.0)
MCH: 29.2 pg (ref 26.0–34.0)
MCHC: 32.8 g/dL (ref 30.0–36.0)
MCV: 89.2 fL (ref 78.0–100.0)
MPV: 11.3 fL (ref 8.6–12.4)
Monocytes Absolute: 0.5 10*3/uL (ref 0.1–1.0)
Monocytes Relative: 19 % — ABNORMAL HIGH (ref 3–12)
Neutro Abs: 1 10*3/uL — ABNORMAL LOW (ref 1.7–7.7)
Neutrophils Relative %: 38 % — ABNORMAL LOW (ref 43–77)
Platelets: 238 10*3/uL (ref 150–400)
RBC: 4.62 MIL/uL (ref 4.22–5.81)
RDW: 14.2 % (ref 11.5–15.5)
WBC: 2.7 10*3/uL — ABNORMAL LOW (ref 4.0–10.5)

## 2015-07-27 LAB — FERRITIN: Ferritin: 127 ng/mL (ref 20–380)

## 2015-08-08 ENCOUNTER — Telehealth: Payer: Self-pay | Admitting: Gastroenterology

## 2015-08-08 NOTE — Telephone Encounter (Signed)
PLEASE CALL PT. HIS HB IS NORMAL.HIS WBC IS 2.7. IT HAS BEEN LOW BEFORE. NO ADDITIONAL WORKUP NEEDED AT THIS TIME.

## 2015-08-09 NOTE — Telephone Encounter (Signed)
Pt is aware.  

## 2015-09-23 ENCOUNTER — Other Ambulatory Visit: Payer: Self-pay | Admitting: Family Medicine

## 2015-09-27 DIAGNOSIS — I10 Essential (primary) hypertension: Secondary | ICD-10-CM | POA: Diagnosis not present

## 2015-09-27 DIAGNOSIS — E785 Hyperlipidemia, unspecified: Secondary | ICD-10-CM | POA: Diagnosis not present

## 2015-09-27 DIAGNOSIS — Z79899 Other long term (current) drug therapy: Secondary | ICD-10-CM | POA: Diagnosis not present

## 2015-09-27 DIAGNOSIS — B20 Human immunodeficiency virus [HIV] disease: Secondary | ICD-10-CM | POA: Diagnosis not present

## 2015-09-27 DIAGNOSIS — H2513 Age-related nuclear cataract, bilateral: Secondary | ICD-10-CM | POA: Diagnosis not present

## 2015-09-27 DIAGNOSIS — H47292 Other optic atrophy, left eye: Secondary | ICD-10-CM | POA: Diagnosis not present

## 2015-09-27 DIAGNOSIS — Z7982 Long term (current) use of aspirin: Secondary | ICD-10-CM | POA: Diagnosis not present

## 2015-09-27 DIAGNOSIS — H401124 Primary open-angle glaucoma, left eye, indeterminate stage: Secondary | ICD-10-CM | POA: Diagnosis not present

## 2015-09-27 DIAGNOSIS — H43811 Vitreous degeneration, right eye: Secondary | ICD-10-CM | POA: Diagnosis not present

## 2015-09-28 DIAGNOSIS — B20 Human immunodeficiency virus [HIV] disease: Secondary | ICD-10-CM | POA: Diagnosis not present

## 2015-09-28 DIAGNOSIS — I10 Essential (primary) hypertension: Secondary | ICD-10-CM | POA: Diagnosis not present

## 2015-09-28 DIAGNOSIS — Z792 Long term (current) use of antibiotics: Secondary | ICD-10-CM | POA: Diagnosis not present

## 2015-09-28 DIAGNOSIS — G35 Multiple sclerosis: Secondary | ICD-10-CM | POA: Diagnosis not present

## 2015-10-05 ENCOUNTER — Ambulatory Visit (INDEPENDENT_AMBULATORY_CARE_PROVIDER_SITE_OTHER): Payer: Medicare Other | Admitting: Family Medicine

## 2015-10-05 ENCOUNTER — Encounter: Payer: Self-pay | Admitting: Family Medicine

## 2015-10-05 VITALS — BP 128/86 | Ht 68.5 in | Wt 215.6 lb

## 2015-10-05 DIAGNOSIS — N289 Disorder of kidney and ureter, unspecified: Secondary | ICD-10-CM | POA: Diagnosis not present

## 2015-10-05 DIAGNOSIS — M791 Myalgia, unspecified site: Secondary | ICD-10-CM

## 2015-10-05 DIAGNOSIS — E785 Hyperlipidemia, unspecified: Secondary | ICD-10-CM | POA: Diagnosis not present

## 2015-10-05 DIAGNOSIS — K760 Fatty (change of) liver, not elsewhere classified: Secondary | ICD-10-CM | POA: Diagnosis not present

## 2015-10-05 DIAGNOSIS — M109 Gout, unspecified: Secondary | ICD-10-CM | POA: Insufficient documentation

## 2015-10-05 DIAGNOSIS — R059 Cough, unspecified: Secondary | ICD-10-CM

## 2015-10-05 DIAGNOSIS — R05 Cough: Secondary | ICD-10-CM

## 2015-10-05 DIAGNOSIS — I10 Essential (primary) hypertension: Secondary | ICD-10-CM | POA: Diagnosis not present

## 2015-10-05 DIAGNOSIS — E119 Type 2 diabetes mellitus without complications: Secondary | ICD-10-CM | POA: Diagnosis not present

## 2015-10-05 DIAGNOSIS — M1000 Idiopathic gout, unspecified site: Secondary | ICD-10-CM | POA: Diagnosis not present

## 2015-10-05 MED ORDER — OXYCODONE-ACETAMINOPHEN 5-325 MG PO TABS: 1.0000 | ORAL_TABLET | Freq: Four times a day (QID) | ORAL | Status: DC | PRN

## 2015-10-05 NOTE — Progress Notes (Signed)
   Subjective:    Patient ID: Kerry Solis, male    DOB: January 29, 1946, 70 y.o.   MRN: [id number]  Hypertension This is a chronic problem. The current episode started more than 1 year ago. Risk factors for coronary artery disease include dyslipidemia and male gender. Treatments tried: norvasc, zesteretic. There are no compliance problems.   Patient maintain his gout medicine watching diet not have any flareups Not taking statin currently because of recent myalgias we will be checking lab work Has fatty liver recent lab work reviewed with patient needs additional lab work watch diet try lose weight Patient having a dry cough could be related to lisinopril but we need to check a chest x-ray because of his underlying health issues Recent lab work showed renal insufficiency reviewed need repeat this watch diet closely  Patient had issues with good eye and went to [facility] and doing better  Review of Systems Patient denies any chest tightness pressure pain shortness breath nausea vomiting diarrhea denies high fever chills sweats    Objective:   Physical Exam Lungs are clear hearts regular abdomen soft no guarding rebound tenderness extremities no edema blood pressure good 25 minutes was spent with the patient. Greater than half the time was spent in discussion and answering questions and counseling regarding the issues that the patient came in for today.       Assessment & Plan:  1. Essential hypertension Blood pressure good control continue current measures  2. Fatty liver Patient encouraged to lose weight repeat lab work. He does have additional lab work was not drawn from recent that he will need to get drawn importance of losing weight discuss  3. Hyperlipemia Patient currently not taking statin check lipid panel if his other blood work comes back looking good then we will reinitiate statin - Lipid panel  4. Controlled type 2 diabetes mellitus without complication, without long-term  current use of insulin (HCC) We will be checking his A1c importance of watching diet discussed minimizing starches in the diet as well - Hemoglobin A1c - Microalbumin / creatinine urine ratio  5. Myalgia We'll be checking lab work to look at myalgias because of this and monitor his CK - Uric acid - CK (Creatine Kinase)  6. Cough Patient could be having cough related to lisinopril but we will check a chest x-ray - DG Chest 2 View  7. Renal insufficiency Patient with recent met 7 showed renal insufficiency therefore check met 7 again - Basic metabolic panel  8. Idiopathic gout, unspecified chronicity, unspecified site No flareup of gout recently continue medication check lab work - Uric acid

## 2015-10-06 DIAGNOSIS — E785 Hyperlipidemia, unspecified: Secondary | ICD-10-CM | POA: Diagnosis not present

## 2015-10-06 DIAGNOSIS — R748 Abnormal levels of other serum enzymes: Secondary | ICD-10-CM | POA: Diagnosis not present

## 2015-10-06 DIAGNOSIS — M1000 Idiopathic gout, unspecified site: Secondary | ICD-10-CM | POA: Diagnosis not present

## 2015-10-06 DIAGNOSIS — E119 Type 2 diabetes mellitus without complications: Secondary | ICD-10-CM | POA: Diagnosis not present

## 2015-10-06 DIAGNOSIS — M791 Myalgia: Secondary | ICD-10-CM | POA: Diagnosis not present

## 2015-10-06 DIAGNOSIS — N289 Disorder of kidney and ureter, unspecified: Secondary | ICD-10-CM | POA: Diagnosis not present

## 2015-10-07 LAB — BASIC METABOLIC PANEL
BUN/Creatinine Ratio: 12 (ref 10–24)
BUN: 17 mg/dL (ref 8–27)
CO2: 24 mmol/L (ref 18–29)
Calcium: 9.4 mg/dL (ref 8.6–10.2)
Chloride: 101 mmol/L (ref 96–106)
Creatinine, Ser: 1.46 mg/dL — ABNORMAL HIGH (ref 0.76–1.27)
GFR calc Af Amer: 55 mL/min/{1.73_m2} — ABNORMAL LOW (ref >59)
GFR calc non Af Amer: 48 mL/min/{1.73_m2} — ABNORMAL LOW (ref >59)
Glucose: 111 mg/dL — ABNORMAL HIGH (ref 65–99)
Potassium: 4.6 mmol/L (ref 3.5–5.2)
Sodium: 141 mmol/L (ref 134–144)

## 2015-10-07 LAB — HEPATITIS C ANTIBODY: Hep C Virus Ab: <0.1 s/co ratio (ref 0.0–0.9)

## 2015-10-07 LAB — URIC ACID: Uric Acid: 7.2 mg/dL (ref 3.7–8.6)

## 2015-10-07 LAB — LIPID PANEL
Chol/HDL Ratio: 5 ratio units (ref 0.0–5.0)
Cholesterol, Total: 151 mg/dL (ref 100–199)
HDL: 30 mg/dL — ABNORMAL LOW (ref >39)
LDL Calculated: 96 mg/dL (ref 0–99)
Triglycerides: 126 mg/dL (ref 0–149)
VLDL Cholesterol Cal: 25 mg/dL (ref 5–40)

## 2015-10-07 LAB — CK: Total CK: 280 U/L — ABNORMAL HIGH (ref 24–204)

## 2015-10-07 LAB — HEMOGLOBIN A1C
Est. average glucose Bld gHb Est-mCnc: 146 mg/dL
Hgb A1c MFr Bld: 6.7 % — ABNORMAL HIGH (ref 4.8–5.6)

## 2015-10-07 LAB — HEPATITIS B SURFACE ANTIGEN: Hepatitis B Surface Ag: NEGATIVE

## 2015-10-07 LAB — MICROALBUMIN / CREATININE URINE RATIO
Creatinine, Urine: 121.7 mg/dL
MICROALB/CREAT RATIO: 14.4 mg/g creat (ref 0.0–30.0)
Microalbumin, Urine: 17.5 ug/mL

## 2015-10-07 LAB — ANA: Anti Nuclear Antibody(ANA): NEGATIVE

## 2015-10-07 LAB — CERULOPLASMIN: Ceruloplasmin: 28.3 mg/dL (ref 16.0–31.0)

## 2015-10-09 ENCOUNTER — Encounter: Payer: Self-pay | Admitting: Family Medicine

## 2015-10-10 ENCOUNTER — Other Ambulatory Visit: Payer: Self-pay

## 2015-10-10 DIAGNOSIS — R748 Abnormal levels of other serum enzymes: Secondary | ICD-10-CM

## 2015-11-16 DIAGNOSIS — H401124 Primary open-angle glaucoma, left eye, indeterminate stage: Secondary | ICD-10-CM | POA: Diagnosis not present

## 2015-11-16 DIAGNOSIS — H2513 Age-related nuclear cataract, bilateral: Secondary | ICD-10-CM | POA: Diagnosis not present

## 2015-11-16 DIAGNOSIS — H40001 Preglaucoma, unspecified, right eye: Secondary | ICD-10-CM | POA: Diagnosis not present

## 2015-11-16 DIAGNOSIS — Z7982 Long term (current) use of aspirin: Secondary | ICD-10-CM | POA: Diagnosis not present

## 2015-11-16 DIAGNOSIS — Z79899 Other long term (current) drug therapy: Secondary | ICD-10-CM | POA: Diagnosis not present

## 2015-11-17 DIAGNOSIS — G35 Multiple sclerosis: Secondary | ICD-10-CM | POA: Diagnosis not present

## 2015-11-17 DIAGNOSIS — H2513 Age-related nuclear cataract, bilateral: Secondary | ICD-10-CM | POA: Diagnosis not present

## 2015-11-17 DIAGNOSIS — Z7982 Long term (current) use of aspirin: Secondary | ICD-10-CM | POA: Diagnosis not present

## 2015-11-17 DIAGNOSIS — Z79899 Other long term (current) drug therapy: Secondary | ICD-10-CM | POA: Diagnosis not present

## 2015-11-17 DIAGNOSIS — I10 Essential (primary) hypertension: Secondary | ICD-10-CM | POA: Diagnosis not present

## 2015-11-17 DIAGNOSIS — H47292 Other optic atrophy, left eye: Secondary | ICD-10-CM | POA: Diagnosis not present

## 2015-11-17 DIAGNOSIS — H401120 Primary open-angle glaucoma, left eye, stage unspecified: Secondary | ICD-10-CM | POA: Diagnosis not present

## 2015-11-17 DIAGNOSIS — B20 Human immunodeficiency virus [HIV] disease: Secondary | ICD-10-CM | POA: Diagnosis not present

## 2015-11-17 DIAGNOSIS — E785 Hyperlipidemia, unspecified: Secondary | ICD-10-CM | POA: Diagnosis not present

## 2015-11-17 DIAGNOSIS — H43811 Vitreous degeneration, right eye: Secondary | ICD-10-CM | POA: Diagnosis not present

## 2015-11-22 ENCOUNTER — Other Ambulatory Visit: Payer: Self-pay | Admitting: Family Medicine

## 2015-12-22 ENCOUNTER — Other Ambulatory Visit: Payer: Self-pay | Admitting: Family Medicine

## 2015-12-22 NOTE — Telephone Encounter (Signed)
May we refill Zoloft?

## 2016-01-02 DIAGNOSIS — E785 Hyperlipidemia, unspecified: Secondary | ICD-10-CM | POA: Diagnosis not present

## 2016-01-02 DIAGNOSIS — B20 Human immunodeficiency virus [HIV] disease: Secondary | ICD-10-CM | POA: Diagnosis not present

## 2016-01-02 DIAGNOSIS — Z79899 Other long term (current) drug therapy: Secondary | ICD-10-CM | POA: Diagnosis not present

## 2016-01-02 DIAGNOSIS — I10 Essential (primary) hypertension: Secondary | ICD-10-CM | POA: Diagnosis not present

## 2016-01-02 DIAGNOSIS — F329 Major depressive disorder, single episode, unspecified: Secondary | ICD-10-CM | POA: Diagnosis not present

## 2016-01-02 DIAGNOSIS — Z7982 Long term (current) use of aspirin: Secondary | ICD-10-CM | POA: Diagnosis not present

## 2016-01-02 DIAGNOSIS — G35 Multiple sclerosis: Secondary | ICD-10-CM | POA: Diagnosis not present

## 2016-02-02 ENCOUNTER — Encounter: Payer: Self-pay | Admitting: Family Medicine

## 2016-02-02 ENCOUNTER — Ambulatory Visit (INDEPENDENT_AMBULATORY_CARE_PROVIDER_SITE_OTHER): Payer: Medicare Other | Admitting: Family Medicine

## 2016-02-02 VITALS — BP 128/86 | Ht 68.5 in | Wt 213.0 lb

## 2016-02-02 DIAGNOSIS — M1000 Idiopathic gout, unspecified site: Secondary | ICD-10-CM

## 2016-02-02 DIAGNOSIS — Z125 Encounter for screening for malignant neoplasm of prostate: Secondary | ICD-10-CM

## 2016-02-02 DIAGNOSIS — E785 Hyperlipidemia, unspecified: Secondary | ICD-10-CM | POA: Diagnosis not present

## 2016-02-02 DIAGNOSIS — E119 Type 2 diabetes mellitus without complications: Secondary | ICD-10-CM | POA: Diagnosis not present

## 2016-02-02 DIAGNOSIS — I10 Essential (primary) hypertension: Secondary | ICD-10-CM | POA: Diagnosis not present

## 2016-02-02 DIAGNOSIS — R7401 Elevation of levels of liver transaminase levels: Secondary | ICD-10-CM

## 2016-02-02 DIAGNOSIS — R740 Nonspecific elevation of levels of transaminase and lactic acid dehydrogenase [LDH]: Secondary | ICD-10-CM | POA: Diagnosis not present

## 2016-02-02 DIAGNOSIS — R972 Elevated prostate specific antigen [PSA]: Secondary | ICD-10-CM

## 2016-02-02 LAB — POCT GLYCOSYLATED HEMOGLOBIN (HGB A1C): Hemoglobin A1C: 6.4

## 2016-02-02 MED ORDER — PRAVASTATIN SODIUM 40 MG PO TABS: ORAL_TABLET | ORAL | 5 refills | Status: DC

## 2016-02-02 MED ORDER — SERTRALINE HCL 100 MG PO TABS: 100.0000 mg | ORAL_TABLET | Freq: Every day | ORAL | 5 refills | Status: DC

## 2016-02-02 MED ORDER — OXYCODONE-ACETAMINOPHEN 5-325 MG PO TABS: 1.0000 | ORAL_TABLET | Freq: Four times a day (QID) | ORAL | 0 refills | Status: DC | PRN

## 2016-02-02 NOTE — Progress Notes (Signed)
   Subjective:    Patient ID: Kerry Solis, male    DOB: [birth year], 70 y.o.   MRN: [id number]  Diabetes  He presents for his follow-up diabetic visit. He has type 2 diabetes mellitus. Pertinent negatives for hypoglycemia include no confusion. Pertinent negatives for diabetes include no chest pain, no fatigue, no polydipsia, no polyphagia and no weakness. Current diabetic treatment includes diet. He is compliant with treatment all of the time. Exercise: no regular exercise but works around house and yard. Home blood sugar record trend: pt does not have a meter to check blood sugar.  A1C today 6.4.  Having pain all over. Hips feel heavy.He relates low back pain discomfort relates some aching discomfort into the hips and upper thigh. He denies numbness or tingling into the legs.  Some forgetfulness intermittently he forgets things that bothers him his mom had a history of dementia he has not gotten lost he still able to handle his own affairs at home and he has been able to take his medicines on a regular basis without having trouble  Mini cog - pass Patient does try to watch his cholesterol he came off of pravastatin see [facility] body aches did not we will restart it he has a history of elevated liver enzymes were check his liver history of gout but he is doing a good job taking his medication.  Moods fair-withdrawn at times. Depressed at times. Denies nausea vomiting diarrhea. Denies high fevers. Not suicidal.   Review of Systems  Constitutional: Negative for activity change, appetite change and fatigue.  HENT: Negative for congestion.   Respiratory: Negative for cough.   Cardiovascular: Negative for chest pain.  Gastrointestinal: Negative for abdominal pain.  Endocrine: Negative for polydipsia and polyphagia.  Musculoskeletal: Positive for arthralgias, back pain and gait problem.  Neurological: Negative for weakness.  Psychiatric/Behavioral: Negative for confusion.         Objective:   Physical Exam  Constitutional: He appears well-nourished. No distress.  Cardiovascular: Normal rate, regular rhythm and normal heart sounds.   No murmur heard. Pulmonary/Chest: Effort normal and breath sounds normal. No respiratory distress.  Musculoskeletal: He exhibits no edema.  Lymphadenopathy:    He has no cervical adenopathy.  Neurological: He is alert.  Psychiatric: His behavior is normal.  Vitals reviewed.    25 minutes was spent with the patient. Greater than half the time was spent in discussion and answering questions and counseling regarding the issues that the patient came in for today.      Assessment & Plan:  Diabetes good control continue current measures. Diabetic foot exam normal Hyperlipidemia previous labs reviewed reinitiate medication check lab work again this fall HTN good control continue current measures History of elevated liver enzymes check liver profile History gout continue medication check uric acid level PSA screening ordered Forgetfulness passes mini cognitive testing. No need for any type of major intervention He sees a specialist they will be doing a follow-up on his MRI the brain because of his MS Moderate low back pain arthralgias recommend stretching excises balance exercises were May use oxycodone for severe pain use sparingly

## 2016-02-02 NOTE — Patient Instructions (Signed)
We are increasing your antidepressant  Zoloft(sertraline) new dose 100 mg daily  If any confusion or questions on your medicine please call  Recheck in 3 months

## 2016-02-03 LAB — LIPID PANEL
Chol/HDL Ratio: 4.8 ratio units (ref 0.0–5.0)
Cholesterol, Total: 163 mg/dL (ref 100–199)
HDL: 34 mg/dL — ABNORMAL LOW (ref >39)
LDL Calculated: 104 mg/dL — ABNORMAL HIGH (ref 0–99)
Triglycerides: 126 mg/dL (ref 0–149)
VLDL Cholesterol Cal: 25 mg/dL (ref 5–40)

## 2016-02-03 LAB — PSA: Prostate Specific Ag, Serum: 4.8 ng/mL — ABNORMAL HIGH (ref 0.0–4.0)

## 2016-02-03 LAB — BASIC METABOLIC PANEL
BUN/Creatinine Ratio: 13 (ref 10–24)
BUN: 20 mg/dL (ref 8–27)
CO2: 22 mmol/L (ref 18–29)
Calcium: 9.7 mg/dL (ref 8.6–10.2)
Chloride: 102 mmol/L (ref 96–106)
Creatinine, Ser: 1.52 mg/dL — ABNORMAL HIGH (ref 0.76–1.27)
GFR calc Af Amer: 52 mL/min/{1.73_m2} — ABNORMAL LOW (ref >59)
GFR calc non Af Amer: 45 mL/min/{1.73_m2} — ABNORMAL LOW (ref >59)
Glucose: 101 mg/dL — ABNORMAL HIGH (ref 65–99)
Potassium: 4.9 mmol/L (ref 3.5–5.2)
Sodium: 142 mmol/L (ref 134–144)

## 2016-02-03 LAB — HEPATIC FUNCTION PANEL
ALT: 54 IU/L — ABNORMAL HIGH (ref 0–44)
AST: 50 IU/L — ABNORMAL HIGH (ref 0–40)
Albumin: 4.5 g/dL (ref 3.5–4.8)
Alkaline Phosphatase: 59 IU/L (ref 39–117)
Bilirubin Total: 0.2 mg/dL (ref 0.0–1.2)
Bilirubin, Direct: 0.12 mg/dL (ref 0.00–0.40)
Total Protein: 7.6 g/dL (ref 6.0–8.5)

## 2016-02-03 LAB — URIC ACID: Uric Acid: 7.6 mg/dL (ref 3.7–8.6)

## 2016-02-08 NOTE — Addendum Note (Signed)
Addended by: Launa Grill on: 02/08/2016 01:38 PM   Modules accepted: Orders

## 2016-02-08 NOTE — Addendum Note (Signed)
Addended by: Launa Grill on: 02/08/2016 04:38 PM   Modules accepted: Orders

## 2016-02-13 ENCOUNTER — Encounter: Payer: Self-pay | Admitting: Family Medicine

## 2016-02-24 ENCOUNTER — Encounter: Payer: Self-pay | Admitting: Family Medicine

## 2016-02-27 DIAGNOSIS — R748 Abnormal levels of other serum enzymes: Secondary | ICD-10-CM | POA: Diagnosis not present

## 2016-02-27 DIAGNOSIS — R972 Elevated prostate specific antigen [PSA]: Secondary | ICD-10-CM | POA: Diagnosis not present

## 2016-02-28 ENCOUNTER — Other Ambulatory Visit: Payer: Self-pay | Admitting: Family Medicine

## 2016-02-28 LAB — PSA, TOTAL AND FREE
PSA, Free Pct: 13.6 %
PSA, Free: 0.64 ng/mL
Prostate Specific Ag, Serum: 4.7 ng/mL — ABNORMAL HIGH (ref 0.0–4.0)

## 2016-02-28 LAB — CK: Total CK: 263 U/L — ABNORMAL HIGH (ref 24–204)

## 2016-02-29 ENCOUNTER — Encounter: Payer: Self-pay | Admitting: Family Medicine

## 2016-03-07 DIAGNOSIS — G35 Multiple sclerosis: Secondary | ICD-10-CM | POA: Diagnosis not present

## 2016-03-14 ENCOUNTER — Observation Stay (HOSPITAL_COMMUNITY)
Admission: EM | Admit: 2016-03-14 | Discharge: 2016-03-16 | Disposition: A | Discharge status: Home / Self Care | Payer: Medicare Other | Attending: Internal Medicine | Admitting: Internal Medicine

## 2016-03-14 ENCOUNTER — Encounter (HOSPITAL_COMMUNITY): Payer: Self-pay

## 2016-03-14 ENCOUNTER — Emergency Department (HOSPITAL_COMMUNITY): Payer: Medicare Other

## 2016-03-14 DIAGNOSIS — M25561 Pain in right knee: Secondary | ICD-10-CM | POA: Diagnosis not present

## 2016-03-14 DIAGNOSIS — E119 Type 2 diabetes mellitus without complications: Secondary | ICD-10-CM | POA: Diagnosis not present

## 2016-03-14 DIAGNOSIS — Y939 Activity, unspecified: Secondary | ICD-10-CM | POA: Diagnosis not present

## 2016-03-14 DIAGNOSIS — S82131A Displaced fracture of medial condyle of right tibia, initial encounter for closed fracture: Secondary | ICD-10-CM | POA: Diagnosis present

## 2016-03-14 DIAGNOSIS — Z79899 Other long term (current) drug therapy: Secondary | ICD-10-CM | POA: Insufficient documentation

## 2016-03-14 DIAGNOSIS — Y999 Unspecified external cause status: Secondary | ICD-10-CM | POA: Diagnosis not present

## 2016-03-14 DIAGNOSIS — W19XXXA Unspecified fall, initial encounter: Secondary | ICD-10-CM

## 2016-03-14 DIAGNOSIS — Y92096 Garden or yard of other non-institutional residence as the place of occurrence of the external cause: Secondary | ICD-10-CM | POA: Insufficient documentation

## 2016-03-14 DIAGNOSIS — Y92009 Unspecified place in unspecified non-institutional (private) residence as the place of occurrence of the external cause: Secondary | ICD-10-CM

## 2016-03-14 DIAGNOSIS — S8991XA Unspecified injury of right lower leg, initial encounter: Secondary | ICD-10-CM | POA: Diagnosis present

## 2016-03-14 DIAGNOSIS — S82141A Displaced bicondylar fracture of right tibia, initial encounter for closed fracture: Secondary | ICD-10-CM | POA: Diagnosis not present

## 2016-03-14 DIAGNOSIS — G35 Multiple sclerosis: Secondary | ICD-10-CM | POA: Diagnosis present

## 2016-03-14 DIAGNOSIS — W1839XA Other fall on same level, initial encounter: Secondary | ICD-10-CM | POA: Insufficient documentation

## 2016-03-14 DIAGNOSIS — M6281 Muscle weakness (generalized): Secondary | ICD-10-CM

## 2016-03-14 DIAGNOSIS — R262 Difficulty in walking, not elsewhere classified: Secondary | ICD-10-CM | POA: Diagnosis present

## 2016-03-14 DIAGNOSIS — T148 Other injury of unspecified body region: Secondary | ICD-10-CM | POA: Diagnosis not present

## 2016-03-14 DIAGNOSIS — B20 Human immunodeficiency virus [HIV] disease: Secondary | ICD-10-CM | POA: Diagnosis present

## 2016-03-14 DIAGNOSIS — I10 Essential (primary) hypertension: Secondary | ICD-10-CM | POA: Insufficient documentation

## 2016-03-14 MED ORDER — HYDROCODONE-ACETAMINOPHEN 5-325 MG PO TABS
1.0000 | ORAL_TABLET | Freq: Once | ORAL | Status: AC
  Administered 2016-03-14: 1 via ORAL
  Filled 2016-03-14: qty 1

## 2016-03-14 NOTE — ED Triage Notes (Signed)
Patient reports of stepped in hole while in yard this evening. Complains of right knee pain.

## 2016-03-14 NOTE — ED Provider Notes (Signed)
[facility] DEPT Provider Note   CSN: [id number] Arrival date & time: 03/14/16  2031 By signing my name below, I, Georgette Shell, attest that this documentation has been prepared under the direction and in the presence of Forde Dandy, MD. Electronically Signed: Georgette Shell, ED Scribe. 03/14/16. 10:52 PM.  History   Chief Complaint Chief Complaint  Patient presents with  . Fall   HPI Comments: Kerry Solis is a 70 y.o. male with h/o multiple sclerosis and HIV w/ undetectable viral load who presents to the Emergency Department complaining of right knee pain s/p mechanical fall earlier this evening. Pt states that he stepped in a hole while in his yard and fell on right side, twisting the right knee. No LOC. Pt denies any head injury. Pt was not ambulatory and not able to bear weight on the right leg after his injury secondary to the pain. No alleviating factors noted.  He is compliant with his medications. Pt is not on blood thinners. He denies any additional injuries. Pt denies numbness, neck pain, back pain, or any other associated symptoms.  The history is provided by the patient. No language interpreter was used.    Past Medical History:  Diagnosis Date  . Anxiety   . Arthritis   . Cryptococcal meningitis ([place])   . Depression   . Elevated liver enzymes   . Glaucoma   . High triglycerides   . History of gout   . HIV (human immunodeficiency virus infection) ([place])   . Hypertension   . Leukopenia    Low CD4  . Multiple sclerosis ([place])   . Myocardial infarction ([place])    45 years ago    Patient Active Problem List   Diagnosis Date Noted  . Diabetes type 2, controlled ([place]) 10/05/2015  . Gout 10/05/2015  . Fatty liver 06/24/2015  . Hx of adenomatous colonic polyps 03/31/2015  . FH: colon cancer 03/31/2015  . Prediabetes 12/04/2013  . Hyperlipemia 03/11/2013  . Hyperglycemia 03/11/2013  . Stress fracture 01/14/2013  . Knee pain, chronic 01/14/2013  . RECTAL BLEEDING  02/15/2009  . HIV INFECTION 02/14/2009  . UNSPECIFIED VISUAL LOSS 02/14/2009  . Essential hypertension 02/14/2009  . CONSTIPATION 02/14/2009  . ABDOMINAL PAIN 02/14/2009  . Elevated transaminase level 02/14/2009  . HX OF GALLSTONE 02/14/2009    Past Surgical History:  Procedure Laterality Date  . CHOLECYSTECTOMY  06/10/2011   Procedure: LAPAROSCOPIC CHOLECYSTECTOMY;  Surgeon: Donato Heinz, MD;  Location: [facility] ORS;  Service: General;  Laterality: N/A;  . COLONOSCOPY  03/10/2009   [id number] internal hemorrhoids/6-mm sessile ascending colon polyp/tortuous colon, diverticulosis. TA  . COLONOSCOPY WITH PROPOFOL N/A 04/19/2015   Procedure: COLONOSCOPY WITH PROPOFOL;  Surgeon: Danie Binder, MD;  Location: [facility];  Service: Endoscopy;  Laterality: N/A;  cecum time in 1434     85min  total withdrawal time  . ESOPHAGOGASTRODUODENOSCOPY  06/23/2008   [id number] gastritis/schatzki ring  . NECK SURGERY     disc  . POLYPECTOMY N/A 04/19/2015   Procedure: POLYPECTOMY;  Surgeon: Danie Binder, MD;  Location: [facility];  Service: Endoscopy;  Laterality: N/A;  ascending colon       Home Medications    Prior to Admission medications   Medication Sig Start Date End Date Taking? Authorizing Provider  pravastatin (PRAVACHOL) 40 MG tablet TAKE 1 TABLET BY MOUTH AT BEDTIME FOR CHOLESTEROL Patient taking differently: Take 40 mg by mouth at bedtime.  02/02/16  Yes Kathyrn Drown, MD  acetaminophen (  TYLENOL) 500 MG tablet Take 1,000 mg by mouth 3 (three) times daily as needed for mild pain or moderate pain.    Historical Provider, MD  albuterol (PROVENTIL HFA;VENTOLIN HFA) 108 (90 BASE) MCG/ACT inhaler Inhale 2 puffs into the lungs every 6 (six) hours as needed for wheezing. 01/13/13   Kathyrn Drown, MD  allopurinol (ZYLOPRIM) 100 MG tablet TAKE (1) TABLET BY MOUTH ONCE DAILY FOR GOUT. 02/28/16   Kathyrn Drown, MD  amitriptyline (ELAVIL) 25 MG tablet TAKE TWO TABLETS BY MOUTH DAILY AT BEDTIME 12/11/14    Historical Provider, MD  amLODipine (NORVASC) 10 MG tablet TAKE (1) TABLET BY MOUTH DAILY FOR HIGH BLOOD PRESSURE. 09/23/15   Kathyrn Drown, MD  ATRIPLA 600-200-300 MG per tablet TAKE ONE TABLET BY MOUTH ONCE IN THE EVENING 12/11/14   Historical Provider, MD  DESCOVY 200-25 MG tablet TAKE ONE TABLET BY MOUTH ONCE DAILY 04/07/15   Historical Provider, MD  dolutegravir (TIVICAY) 50 MG tablet Take 50 mg by mouth daily.  03/30/15 03/29/16  Historical Provider, MD  interferon beta-1b (BETASERON) 0.3 MG injection Inject 44 mg into the skin every Monday, Wednesday, and Friday.      Historical Provider, MD  latanoprost (XALATAN) 0.005 % ophthalmic solution Place 1 drop into the left eye at bedtime.  07/15/14   Historical Provider, MD  lisinopril-hydrochlorothiazide (PRINZIDE,ZESTORETIC) 10-12.5 MG tablet TAKE (1) TABLET BY MOUTH DAILY FOR HIGH BLOOD PRESSURE. 02/28/16   Kathyrn Drown, MD  oxyCODONE-acetaminophen (PERCOCET/ROXICET) 5-325 MG tablet Take 1 tablet by mouth every 6 (six) hours as needed for severe pain. 02/02/16   Kathyrn Drown, MD  sertraline (ZOLOFT) 100 MG tablet Take 1 tablet (100 mg total) by mouth daily. 02/02/16   Kathyrn Drown, MD    Family History Family History  Problem Relation Age of Onset  . Hypertension Mother   . Heart attack Mother   . Heart attack Father   . Cancer Sister     Breast  . Diabetes Sister   . Cancer Brother     Colon  . Diabetes Brother   . Cancer Brother     colon    Social History Social History  Substance Use Topics  . Smoking status: Never Smoker  . Smokeless tobacco: Never Used  . Alcohol use No     Allergies   Yellow jacket venom   Review of Systems Review of Systems 10/14 systems reviewed and are negative other than those stated in the HPI  Physical Exam Updated Vital Signs BP 157/67 (BP Location: Left Arm)   Pulse 95   Temp 98.9 F (37.2 C) (Temporal)   Resp 16   Ht 5' 8.5" (1.74 m)   Wt 210 lb (95.3 kg)   SpO2 99%   BMI  31.47 kg/m   Physical Exam Physical Exam  Nursing note and vitals reviewed. Constitutional: Well developed, well nourished, non-toxic, and in no acute distress Head: Normocephalic and atraumatic.  Mouth/Throat: Oropharynx is clear and moist.  Neck: Normal range of motion. Neck supple.  Cardiovascular: Normal rate and regular rhythm.  +2 DP pulses.  Pulmonary/Chest: Effort normal and breath sounds normal.  Abdominal: Soft. There is no tenderness. There is no rebound and no guarding.  Musculoskeletal: Tenderness over the right knee with mild soft tissue swelling.  Neurological: Alert, no facial droop, fluent speech. Full strength in right large toe dorsiflexion and plantarflexion. Sensation to light touch intact in RLE.  Skin: Skin is warm and dry.  Psychiatric: Cooperative   ED Treatments / Results  DIAGNOSTIC STUDIES: Oxygen Saturation is 99% on RA, normal by my interpretation.    COORDINATION OF CARE: 10:48 PM Discussed treatment plan with pt at bedside which includes orthopedic consult and pt agreed to plan.  Labs (all labs ordered are listed, but only abnormal results are displayed) Labs Reviewed  CBC WITH DIFFERENTIAL/PLATELET  BASIC METABOLIC PANEL    EKG  EKG Interpretation None       Radiology Ct Knee Right Wo Contrast  Result Date: 03/15/2016 CLINICAL DATA:  70 year old male with fall and right knee pain. EXAM: CT OF THE right KNEE WITHOUT CONTRAST TECHNIQUE: Multidetector CT imaging of the right knee was performed according to the standard protocol. Multiplanar CT image reconstructions were also generated. COMPARISON:  Right knee radiograph dated 03/14/2016 FINDINGS: There is comminuted fracture of the proximal tibia with involvement of the anterior aspect of the lateral and medial tibial plateau. There is mild depression of the anterior aspect of the lateral tibial plateau. There is extension of the fracture into the lateral aspect of the medial tibial plateau  anteriorly with mildly depressed anterior plateau fracture. There is extension of the fracture along the anterior cortex of the proximal tibial metaphysis. No other acute fracture identified. There is no dislocation. The bones are osteopenic. There is a moderate size suprapatellar lipohemarthrosis. There is induration of the Hoffa's fat pad. There is a small joint effusion in the posterior knee compartment. There is inflammatory changes and induration of the fat surrounding the anterior cruciate ligament. An ACL injury or avulsion at the insertion on the intercondylar notch may be present. IMPRESSION: Mildly depressed comminuted fractures of the anterior aspects of the medial and lateral tibial plateau extending along the anterior cortex of the proximal tibial metaphysis. Moderate size suprapatellar lipohemarthrosis as well as posterior knee effusion. Induration of the Hoffa's fat pad as well as ACL with findings concerning for possible ACL injury or avulsion at the insertion on the intercondylar notch. Electronically Signed   By: Anner Crete M.D.   On: 03/15/2016 00:30   Dg Knee Complete 4 Views Right  Result Date: 03/14/2016 CLINICAL DATA:  Knee pain after falling in hole EXAM: RIGHT KNEE - COMPLETE 4+ VIEW COMPARISON:  07/19/2014 FINDINGS: There is a large lipohemarthrosis identified. Acute comminuted fracture deformity involving the medial tibial plateau is identified. No dislocation. IMPRESSION: Acute tibial plateau fracture. Suggest further evaluation with CT of the left knee. Lipohemarthrosis Electronically Signed   By: Kerby Moors M.D.   On: 03/14/2016 21:23    Procedures Procedures (including critical care time)  Medications Ordered in ED Medications  HYDROmorphone (DILAUDID) injection 0.5 mg (not administered)  HYDROcodone-acetaminophen (NORCO/VICODIN) 5-325 MG per tablet 1 tablet (1 tablet Oral Given 03/14/16 2249)     Initial Impression / Assessment and Plan / ED Course  I have  reviewed the triage vital signs and the nursing notes.  Pertinent labs & imaging results that were available during my care of the patient were reviewed by me and considered in my medical decision making (see chart for details).  Clinical Course    70 year old male who presents after mechanical fall with right knee pain and inability to bear weight. He is nontoxic in no acute distress. With right knee pain and soft tissue swelling. Extremities neurovascularly intact. No other injuries are noted on exam. No evidence of head trauma on exam or by history. X-ray of the right knee concerning for tibial plateau fracture. I discussed  with Dr. Aline Brochure who recommended obtaining CT of the knee. Patient lives at home by himself and having difficulty ambulating with crutches in the ED. Plan to admit to hospitalist service. Dr. Aline Brochure okay with this plan.   Final Clinical Impressions(s) / ED Diagnoses   Final diagnoses:  Right medial tibial plateau fracture, closed, initial encounter   I personally performed the services described in this documentation, which was scribed in my presence. The recorded information has been reviewed and is accurate.  New Prescriptions New Prescriptions   No medications on file     Forde Dandy, MD 03/15/16 [phone]

## 2016-03-15 ENCOUNTER — Encounter (HOSPITAL_COMMUNITY): Payer: Self-pay | Admitting: Family Medicine

## 2016-03-15 DIAGNOSIS — S82141A Displaced bicondylar fracture of right tibia, initial encounter for closed fracture: Secondary | ICD-10-CM | POA: Diagnosis present

## 2016-03-15 DIAGNOSIS — B20 Human immunodeficiency virus [HIV] disease: Secondary | ICD-10-CM | POA: Diagnosis not present

## 2016-03-15 DIAGNOSIS — W19XXXA Unspecified fall, initial encounter: Secondary | ICD-10-CM

## 2016-03-15 DIAGNOSIS — G35 Multiple sclerosis: Secondary | ICD-10-CM | POA: Diagnosis present

## 2016-03-15 DIAGNOSIS — S82191A Other fracture of upper end of right tibia, initial encounter for closed fracture: Secondary | ICD-10-CM

## 2016-03-15 DIAGNOSIS — R262 Difficulty in walking, not elsewhere classified: Secondary | ICD-10-CM | POA: Diagnosis present

## 2016-03-15 DIAGNOSIS — Y92009 Unspecified place in unspecified non-institutional (private) residence as the place of occurrence of the external cause: Secondary | ICD-10-CM

## 2016-03-15 DIAGNOSIS — S82131A Displaced fracture of medial condyle of right tibia, initial encounter for closed fracture: Secondary | ICD-10-CM | POA: Diagnosis present

## 2016-03-15 LAB — CBC WITH DIFFERENTIAL/PLATELET
Basophils Absolute: 0 10*3/uL (ref 0.0–0.1)
Basophils Relative: 0 %
Eosinophils Absolute: 0 10*3/uL (ref 0.0–0.7)
Eosinophils Relative: 1 %
HCT: 38.9 % — ABNORMAL LOW (ref 39.0–52.0)
Hemoglobin: 12.5 g/dL — ABNORMAL LOW (ref 13.0–17.0)
Lymphocytes Relative: 20 %
Lymphs Abs: 1 10*3/uL (ref 0.7–4.0)
MCH: 28.5 pg (ref 26.0–34.0)
MCHC: 32.1 g/dL (ref 30.0–36.0)
MCV: 88.6 fL (ref 78.0–100.0)
Monocytes Absolute: 0.6 10*3/uL (ref 0.1–1.0)
Monocytes Relative: 12 %
Neutro Abs: 3.2 10*3/uL (ref 1.7–7.7)
Neutrophils Relative %: 67 %
Platelets: 191 10*3/uL (ref 150–400)
RBC: 4.39 MIL/uL (ref 4.22–5.81)
RDW: 14.2 % (ref 11.5–15.5)
WBC: 4.8 10*3/uL (ref 4.0–10.5)

## 2016-03-15 LAB — BASIC METABOLIC PANEL
Anion gap: 8 (ref 5–15)
BUN: 18 mg/dL (ref 6–20)
CO2: 21 mmol/L — ABNORMAL LOW (ref 22–32)
Calcium: 9.1 mg/dL (ref 8.9–10.3)
Chloride: 110 mmol/L (ref 101–111)
Creatinine, Ser: 1.26 mg/dL — ABNORMAL HIGH (ref 0.61–1.24)
GFR calc Af Amer: >60 mL/min (ref >60)
GFR calc non Af Amer: 55 mL/min — ABNORMAL LOW (ref >60)
Glucose, Bld: 126 mg/dL — ABNORMAL HIGH (ref 65–99)
Potassium: 4 mmol/L (ref 3.5–5.1)
Sodium: 139 mmol/L (ref 135–145)

## 2016-03-15 MED ORDER — HYDROMORPHONE HCL 1 MG/ML IJ SOLN
0.5000 mg | Freq: Once | INTRAMUSCULAR | Status: AC
  Administered 2016-03-15: 0.5 mg via INTRAVENOUS
  Filled 2016-03-15: qty 1

## 2016-03-15 MED ORDER — OXYCODONE HCL 5 MG PO TABS
10.0000 mg | ORAL_TABLET | ORAL | Status: DC | PRN
  Administered 2016-03-15 – 2016-03-16 (×5): 10 mg via ORAL
  Filled 2016-03-15 (×5): qty 2

## 2016-03-15 MED ORDER — SODIUM CHLORIDE 0.9 % IV SOLN: 250.0000 mL | INTRAVENOUS | Status: DC | PRN

## 2016-03-15 MED ORDER — SODIUM CHLORIDE 0.9% FLUSH
3.0000 mL | Freq: Two times a day (BID) | INTRAVENOUS | Status: DC
  Administered 2016-03-15 – 2016-03-16 (×4): 3 mL via INTRAVENOUS

## 2016-03-15 MED ORDER — HYDROMORPHONE HCL 1 MG/ML IJ SOLN
0.5000 mg | INTRAMUSCULAR | Status: DC | PRN
  Administered 2016-03-15: 0.5 mg via INTRAVENOUS
  Filled 2016-03-15: qty 1

## 2016-03-15 MED ORDER — IOPAMIDOL (ISOVUE-300) INJECTION 61%
INTRAVENOUS | Status: AC
  Filled 2016-03-15: qty 30

## 2016-03-15 MED ORDER — OXYCODONE HCL 5 MG PO TABS
5.0000 mg | ORAL_TABLET | ORAL | Status: DC | PRN
  Administered 2016-03-15 (×2): 5 mg via ORAL
  Filled 2016-03-15 (×2): qty 1

## 2016-03-15 MED ORDER — DOCUSATE SODIUM 100 MG PO CAPS
100.0000 mg | ORAL_CAPSULE | Freq: Two times a day (BID) | ORAL | Status: DC
  Administered 2016-03-15 – 2016-03-16 (×3): 100 mg via ORAL
  Filled 2016-03-15 (×3): qty 1

## 2016-03-15 MED ORDER — SODIUM CHLORIDE 0.9% FLUSH: 3.0000 mL | INTRAVENOUS | Status: DC | PRN

## 2016-03-15 NOTE — Care Management Obs Status (Signed)
[facility] NOTIFICATION   Patient Details  Name: DOANE PLUDE MRN: [id number] Date of Birth: 02-Jun-1946   Medicare Observation Status Notification Given:  Yes    Sherald Barge, RN 03/15/2016, 2:47 PM

## 2016-03-15 NOTE — Progress Notes (Signed)
Follow up note from earlier admission today:  70 yo with MS, HIV positive, admitted for non surgical right tib plateau Fx for pain control.  He saw Dr Aline Brochure and was recommended NWB for at least 6 weeks. He has MS, and has been on Narcotics at home. Will adjust his pain meds to Percocet 10mg  Q 6 hours PRN and IV dilaudid 0.5mg    Orvan Falconer MD FACP.  Hospitalist.

## 2016-03-15 NOTE — ED Notes (Signed)
Knee immobilizer applied and given crutches. Assisted patient to stand with immobilizer and crutches. Patient not able to bear any weight at all. Patient states that he is having great pain now.

## 2016-03-15 NOTE — Progress Notes (Signed)
Physical Therapy Treatment Patient Details Name: Kerry Solis MRN: [id number] DOB: [birth year] Today's Date: 03/15/2016    History of Present Illness 70 y.o. male with medical history significant of multiple sclerosis, HIV walks with a cane at baseline comes in after tripping on a hole in his yard today and injuring his right knee.  Pt was found to have right tib plateau fracture, dr Aline Brochure was called and advised follow up with him in office however pt could not get up and ambulate with crutches.  He was therefore referred for admission.  Pt lives alone.  PMH: anxiety, arthritis, cryptococcal meningitis, depression, HIV, MS, MI (45 years ago).    PT Comments    Pt received in bed, KI donned, and is agreeable to PT tx.  Pt had great difficulty at first with attempt at sit<>stand, and required Mod A, however after the 3rd trial, he was able to complete with Min guard.  Pt would benefit from HHPT/OT, 24/7 supervision, w/c with elevating LE rests, RW, and a BSC.  Pt expressed that he will not have 24/7 supervision, however his cousin will check in on him   Follow Up Recommendations  Home health PT;Supervision/Assistance - 24 hour (Pt states he will not have 24/7 supervision at home, however his cousin will be able to stop in.  HHOT recommended. )     Equipment Recommendations  Rolling walker with 5" wheels;Wheelchair (measurements PT);3in1 (PT) (w/c with elevating leg rests. )    Recommendations for Other Services       Precautions / Restrictions Precautions Precautions: Fall Precaution Comments: Pt states that he does fall alot - states he has a big dog, that will assist him up when he does fall.   Required Braces or Orthoses: Knee Immobilizer - Right Restrictions Weight Bearing Restrictions: Yes RLE Weight Bearing: Non weight bearing Other Position/Activity Restrictions: NWB R LE with KI donned.     Mobility  Bed Mobility Overal bed mobility: Modified Independent Bed Mobility:  Sidelying to Sit Rolling: Supervision Sidelying to sit: Modified independent (Device/Increase time) (bed flat, increased time, and pt using B UE's to assist R LE off the EOB.  )       General bed mobility comments: Pt found in the bed with KI off - laying over on the counter, and pillow placed under pt's knee in ~20* flexion.  Pt educated on importance of positioning.  KI donned, with increased time and assistance from PT due to pt c/o intense pain.  Unable to get pt's knee to neutral extension due to pain.  Pt required total A with bed in trendelenburg for supine scoot, and supervision for rolling to have pillow placed under R hip for improved positioning.   Transfers Overall transfer level: Needs assistance Equipment used: Rolling walker (2 wheeled) Transfers: Sit to/from [facility] Sit to Stand: Mod assist;Min assist;Min guard Stand pivot transfers: Min guard       General transfer comment: Pt performed transfer x 3 trials 1) bed<>recliner and had great difficulty with sit<>stand requiring Mod A.  2) recliner <>bed with Min A and RW, and 3) bed<>recliner with min guard and increased time.  Pt required vc's each time to get "nose over toes" prior to standing to shift weight fwd, and vc's for safe hand placement.  Pt reminded to remain NWB on the R LE due to occasionally setting it down foot flat on the floor.   Ambulation/Gait Ambulation/Gait assistance:  (NA due to difficulty with transfers. )  Stairs            Wheelchair Mobility    Modified Rankin (Stroke Patients Only)       Balance Overall balance assessment: Needs assistance;History of Falls         Standing balance support: Bilateral upper extremity supported Standing balance-Leahy Scale: Fair                      Cognition Arousal/Alertness: Awake/alert Behavior During Therapy: WFL for tasks assessed/performed Overall Cognitive Status: Within Functional Limits  for tasks assessed                      Exercises Total Joint Exercises Goniometric ROM: R knee is generally lacking ~20* from full extension due to pain.      General Comments        Pertinent Vitals/Pain Pain Assessment: 0-10 Pain Score: 5  Pain Location: R LE Pain Descriptors / Indicators: Aching Pain Intervention(s): Limited activity within patient's tolerance;Monitored during session;Premedicated before session;Repositioned    Home Living   Living Arrangements: Alone Available Help at Discharge: Family (cousin lives across the street - states she can check in every day if needed.  ) Type of Home: House Home Access: Stairs to enter;Ramped entrance   Home Layout: Laundry or work area in basement;Able to live on main level with bedroom/bathroom Home Equipment: Grab bars - tub/shower;Grab bars - toilet;Cane - single point      Prior Function Level of Independence: Independent with assistive device(s)  Gait / Transfers Assistance Needed: Pt states he always uses his cane for ambulation.   ADL's / Homemaking Assistance Needed: independent with ADL's, driving, and is a limited community ambulator.  tries not to use the motorized carts if he doesn't have to.       PT Goals (current goals can now be found in the care plan section) Acute Rehab PT Goals Patient Stated Goal: To be able to mobilize PT Goal Formulation: With patient Time For Goal Achievement: 03/22/16 Potential to Achieve Goals: Good Progress towards PT goals: Progressing toward goals    Frequency    Min 5X/week      PT Plan Current plan remains appropriate    Co-evaluation             End of Session Equipment Utilized During Treatment: Right knee immobilizer Activity Tolerance: Patient tolerated treatment well Patient left: in chair;with call bell/phone within reach     Time: 1244-1311 PT Time Calculation (min) (ACUTE ONLY): 27 min  Charges:  $Therapeutic Activity: 23-37 mins $Self  Care/Home Management: 8-22                    G Codes:  Functional Assessment Tool Used: [employer] "6-clicks"  Functional Limitation: Mobility: Walking and moving around Mobility: Walking and Moving Around Current Status [phone]): At least 80 percent but less than 100 percent impaired, limited or restricted (Mobility greatly limited by pain. ) Mobility: Walking and Moving Around Goal Status [phone]): At least 20 percent but less than 40 percent impaired, limited or restricted   Beth Hildreth Robart, PT, DPT X: [phone]

## 2016-03-15 NOTE — Evaluation (Addendum)
Physical Therapy Evaluation Patient Details Name: Kerry Solis MRN: [id number] DOB: 05/04/1946 Today's Date: 03/15/2016   History of Present Illness  70 y.o. male with medical history significant of multiple sclerosis, HIV walks with a cane at baseline comes in after tripping on a hole in his yard today and injuring his right knee.  Pt was found to have right tib plateau fracture, dr Aline Brochure was called and advised follow up with him in office however pt could not get up and ambulate with crutches.  He was therefore referred for admission.  Pt lives alone.  PMH: anxiety, arthritis, cryptococcal meningitis, depression, HIV, MS, MI (45 years ago).  Clinical Impression  Pt received in bed, and was agreeable to PT evaluation.  Pt states that he lives alone, and normally uses a cane for ambulation.  He reports that he falls a lot, but he has a big dog that will come over and assist him up when he falls.  Upon PT's arrival, his knee was noted to be flexed to ~20* in the bed with a pillow underneath it, and the KI over on the counter.  Pt educate on importance of wearing the brace, and keeping R knee straight.  Pt educated on being NWB on R LE.  As PT was assisting pt with donning of KI, he was in intense pain, and therefore unable to participate in further mobility at that time.  Pt was agreeable to have PT return to continue mobility assessment.  Pt is concerned about returning home alone.      Follow Up Recommendations Home health PT;Supervision/Assistance - 24 hour (Pt states he will not have 24/7 supervision at home, however his cousin will be able to stop in.  HHOT recommended. )    Equipment Recommendations  Rolling walker with 5" wheels;Wheelchair (measurements PT);3in1 (PT) (w/c with elevating leg rests. )    Recommendations for Other Services       Precautions / Restrictions Precautions Precautions: Fall Precaution Comments: Pt states that he does fall alot - states he has a big dog, that  will assist him up when he does fall.   Required Braces or Orthoses: Knee Immobilizer - Right Restrictions Weight Bearing Restrictions: Yes RLE Weight Bearing: Non weight bearing Other Position/Activity Restrictions: NWB R LE with KI donned.       Mobility  Bed Mobility Overal bed mobility: Modified Independent;Needs Assistance (increased time, and used UE's to advance R LE off the EOB. Bed flat ) Bed Mobility: Rolling Rolling: Supervision         General bed mobility comments: Pt found in the bed with KI off - laying over on the counter, and pillow placed under pt's knee in ~20* flexion.  Pt educated on importance of positioning.  KI donned, with increased time and assistance from PT due to pt c/o intense pain.  Unable to get pt's knee to neutral extension due to pain.  Pt required total A with bed in trendelenburg for supine scoot, and supervision for rolling to have pillow placed under R hip for improved positioning.   Transfers Overall transfer level:  (Deferred due to extreme pain with bed mobility and donning KI. )                  Ambulation/Gait Ambulation/Gait assistance:  (Deferred due to extreme pain with bed mobility and donning KI)              Stairs  Wheelchair Mobility    Modified Rankin (Stroke Patients Only)       Balance Overall balance assessment: History of Falls                                           Pertinent Vitals/Pain Pain Assessment: 0-10 Pain Score: 8  Pain Location: R LE Pain Descriptors / Indicators: Aching Pain Intervention(s): Limited activity within patient's tolerance;Monitored during session;Premedicated before session;Repositioned    Home Living   Living Arrangements: Alone Available Help at Discharge: Family (cousin lives across the street - states she can check in every day if needed.  ) Type of Home: House Home Access: Stairs to enter;Ramped entrance   Entrance Stairs-Number  of Steps: 4 in the back, Home Layout: Laundry or work area in basement;Able to live on main level with bedroom/bathroom Home Equipment: Grab bars - tub/shower;Grab bars - toilet;Cane - single point      Prior Function Level of Independence: Independent with assistive device(s)   Gait / Transfers Assistance Needed: Pt states he always uses his cane for ambulation.    ADL's / Homemaking Assistance Needed: independent with ADL's, driving, and is a limited community ambulator.  tries not to use the motorized carts if he doesn't have to.          Hand Dominance   Dominant Hand: Right    Extremity/Trunk Assessment   Upper Extremity Assessment: Overall WFL for tasks assessed           Lower Extremity Assessment: RLE deficits/detail;LLE deficits/detail RLE Deficits / Details: ankle DF/PF is WFL, and hip flexion is limited due to pain.  Knee is not assessed due to tibial plateau fx.   LLE Deficits / Details: generalized weakness     Communication   Communication: No difficulties  Cognition Arousal/Alertness: Awake/alert Behavior During Therapy: WFL for tasks assessed/performed Overall Cognitive Status: Within Functional Limits for tasks assessed                      General Comments      Exercises Total Joint Exercises Goniometric ROM: R knee is generally lacking ~20* from full extension due to pain.     Assessment/Plan    PT Assessment Patient needs continued PT services  PT Problem List Decreased strength;Decreased activity tolerance;Decreased balance;Decreased mobility;Decreased knowledge of use of DME;Decreased safety awareness;Decreased knowledge of precautions;Pain;Obesity          PT Treatment Interventions DME instruction;Functional mobility training;Gait training;Therapeutic activities;Therapeutic exercise;Balance training;Manual techniques;Wheelchair mobility training;Patient/family education    PT Goals (Current goals can be found in the Care Plan  section)  Acute Rehab PT Goals Patient Stated Goal: To be able to mobilize PT Goal Formulation: With patient Time For Goal Achievement: 03/22/16 Potential to Achieve Goals: Good    Frequency Min 5X/week   Barriers to discharge Decreased caregiver support Pt lives alone.     Co-evaluation               End of Session Equipment Utilized During Treatment: Right knee immobilizer Activity Tolerance: Patient limited by pain Patient left: in bed      Functional Assessment Tool Used: [place] "6-clicks"  Functional Limitation: Mobility: Walking and moving around Mobility: Walking and Moving Around Current Status [phone]): At least 80 percent but less than 100 percent impaired, limited or restricted (Mobility greatly limited by pain. ) Mobility:  Walking and Moving Around Goal Status [phone]): At least 20 percent but less than 40 percent impaired, limited or restricted    Time: [id number] PT Time Calculation (min) (ACUTE ONLY): 33 min   Charges:   PT Evaluation $PT Eval Moderate Complexity: 1 Procedure PT Treatments $Self Care/Home Management: 8-22   PT G Codes:   PT G-Codes **NOT FOR INPATIENT CLASS** Functional Assessment Tool Used: [employer] "6-clicks"  Functional Limitation: Mobility: Walking and moving around Mobility: Walking and Moving Around Current Status [phone]): At least 80 percent but less than 100 percent impaired, limited or restricted (Mobility greatly limited by pain. ) Mobility: Walking and Moving Around Goal Status [phone]): At least 20 percent but less than 40 percent impaired, limited or restricted    Beth Caitlinn Klinker, PT, DPT X: [phone]

## 2016-03-15 NOTE — Consult Note (Signed)
Patient ID: Kerry Solis, male   DOB: [birth year], 70 y.o.   MRN: [id number]   Requested by Dr. Raynelle Chary Reason for consultation right tibial plateau fracture  Chief Complaint  Patient presents with  . Fall    HPI Kerry Solis is a 70 y.o. male.  Presents for evaluation of a right tibial plateau fracture sustained on 03/14/2016 at home. This patient fell into a ditch.  Location of pain right knee Duration of pain one day Severity of pain 8-10 out of 10 Quality dull ache Modified by moving the knee makes it worse   Review of Systems (at least 2) Review of Systems 1. Pertinent review of systems he denies numbness or tingling in the right leg, he has a history of HIV-positive status but no active infection. No active fever. 2. Chronic pain from MS with multiple joint aches intermittently relieved by Percocet   Past Medical History:  Diagnosis Date  . Anxiety   . Arthritis   . Cryptococcal meningitis ([place])   . Depression   . Elevated liver enzymes   . Glaucoma   . High triglycerides   . History of gout   . HIV (human immunodeficiency virus infection) ([place])   . Hypertension   . Leukopenia    Low CD4  . Multiple sclerosis ([place])   . Myocardial infarction ([place])    45 years ago     Past Surgical History:  Procedure Laterality Date  . CHOLECYSTECTOMY  06/10/2011   Procedure: LAPAROSCOPIC CHOLECYSTECTOMY;  Surgeon: Donato Heinz, MD;  Location: [facility];  Service: General;  Laterality: N/A;  . COLONOSCOPY  03/10/2009   [id number] internal hemorrhoids/6-mm sessile ascending colon polyp/tortuous colon, diverticulosis. TA  . COLONOSCOPY WITH PROPOFOL N/A 04/19/2015   Procedure: COLONOSCOPY WITH PROPOFOL;  Surgeon: Danie Binder, MD;  Location: [facility];  Service: Endoscopy;  Laterality: N/A;  cecum time in 1434     32min  total withdrawal time  . ESOPHAGOGASTRODUODENOSCOPY  06/23/2008   [id number] gastritis/schatzki ring  . NECK SURGERY     disc  . POLYPECTOMY N/A  04/19/2015   Procedure: POLYPECTOMY;  Surgeon: Danie Binder, MD;  Location: [facility];  Service: Endoscopy;  Laterality: N/A;  ascending colon     Social History  Social History  Substance Use Topics  . Smoking status: Never Smoker  . Smokeless tobacco: Never Used  . Alcohol use No    Allergies  Allergen Reactions  . Yellow Jacket Venom Swelling    Current Facility-Administered Medications  Medication Dose Route Frequency Provider Last Rate Last Dose  . 0.9 %  sodium chloride infusion  250 mL Intravenous PRN Phillips Grout, MD      . docusate sodium (COLACE) capsule 100 mg  100 mg Oral BID Phillips Grout, MD      . oxyCODONE (Oxy IR/ROXICODONE) immediate release tablet 5 mg  5 mg Oral Q4H PRN Phillips Grout, MD   5 mg at 03/15/16 0649  . sodium chloride flush (NS) 0.9 % injection 3 mL  3 mL Intravenous Q12H Phillips Grout, MD   3 mL at 03/15/16 0230  . sodium chloride flush (NS) 0.9 % injection 3 mL  3 mL Intravenous PRN Phillips Grout, MD          Physical Exam-Detailed Physical Exam  Blood pressure (!) 144/63, pulse 97, temperature 98.1 F (36.7 C), temperature source Oral, resp. rate 20, height 5\' 8"  (1.727 m), weight 217 lb 9.5 oz (  98.7 kg), SpO2 97 %. Gen. appearance Normal appearance grooming hygiene without deformity Cardiovascular exam the pulses are 2+ , capillary refill normal, compartment soft  The ambulatory status is requires crutches and maximum assist  Extremity examined right lower extremity  Inspection tense effusion in the right knee right lower extremity compartment soft Range of motion assessment the patient does not move the knee it's held at 30 flexion Stability was not tested because of pain and swelling muscle tone was normal There were no areas of erythema or blistering of the skin of the right knee or leg  Sensation was normal including negative pain on passive stretch  Sensation to touch is normal The patient is oriented to person place and  time The patient's mood and affect show no depression or anxiety or agitation  For comparison the opposite extremity left lower was examined and the following findings were noted    MEDICAL DECISION MAKING (minimum/low)  Data Reviewed  I have personally reviewed the imaging studies and the report and my interpretation is:  CT scan and plain films show medial tibial plateau fracture type for nondisplaced mild depression  Assessment    Right closed tibial plateau fracture    Plan    Nonweightbearing 6 weeks Immobilizing brace Physical therapy       Arther Abbott 03/15/2016, 7:42 AM   Carole Civil MD

## 2016-03-15 NOTE — H&P (Signed)
History and Physical    Kerry Solis [id number] DOB: 06/25/1945 DOA: 03/14/2016  PCP: Sallee Lange, MD  Patient coming from:  home  Chief Complaint:   fall  HPI: Kerry Solis is a 70 y.o. male with medical history significant of multiple sclerosis, HIV walks with a cane at baseline comes in after tripping on a hole in his yard today and injuring his right knee.  Pt was found to have right tib plateau fracture, dr Aline Brochure was called and advised follow up with him in office however pt could not get up and ambulate with crutches.  He was therefore referred for admission.  Pt lives alone.  Review of Systems: As per HPI otherwise 10 point review of systems negative.   Past Medical History:  Diagnosis Date  . Anxiety   . Arthritis   . Cryptococcal meningitis ([place])   . Depression   . Elevated liver enzymes   . Glaucoma   . High triglycerides   . History of gout   . HIV (human immunodeficiency virus infection) ([place])   . Hypertension   . Leukopenia    Low CD4  . Multiple sclerosis ([place])   . Myocardial infarction ([place])    45 years ago    Past Surgical History:  Procedure Laterality Date  . CHOLECYSTECTOMY  06/10/2011   Procedure: LAPAROSCOPIC CHOLECYSTECTOMY;  Surgeon: Donato Heinz, MD;  Location: [facility];  Service: General;  Laterality: N/A;  . COLONOSCOPY  03/10/2009   [id number] internal hemorrhoids/6-mm sessile ascending colon polyp/tortuous colon, diverticulosis. TA  . COLONOSCOPY WITH PROPOFOL N/A 04/19/2015   Procedure: COLONOSCOPY WITH PROPOFOL;  Surgeon: Danie Binder, MD;  Location: [facility];  Service: Endoscopy;  Laterality: N/A;  cecum time in 1434     65min  total withdrawal time  . ESOPHAGOGASTRODUODENOSCOPY  06/23/2008   [id number] gastritis/schatzki ring  . NECK SURGERY     disc  . POLYPECTOMY N/A 04/19/2015   Procedure: POLYPECTOMY;  Surgeon: Danie Binder, MD;  Location: [facility];  Service: Endoscopy;  Laterality: N/A;  ascending colon     reports  that he has never smoked. He has never used smokeless tobacco. He reports that he does not drink alcohol or use drugs.  Allergies  Allergen Reactions  . Yellow Jacket Venom Swelling    Family History  Problem Relation Age of Onset  . Hypertension Mother   . Heart attack Mother   . Heart attack Father   . Cancer Sister     Breast  . Diabetes Sister   . Cancer Brother     Colon  . Diabetes Brother   . Cancer Brother     colon    Prior to Admission medications   Medication Sig Start Date End Date Taking? Authorizing Provider  pravastatin (PRAVACHOL) 40 MG tablet TAKE 1 TABLET BY MOUTH AT BEDTIME FOR CHOLESTEROL Patient taking differently: Take 40 mg by mouth at bedtime.  02/02/16  Yes Kathyrn Drown, MD  acetaminophen (TYLENOL) 500 MG tablet Take 1,000 mg by mouth 3 (three) times daily as needed for mild pain or moderate pain.    Historical Provider, MD  albuterol (PROVENTIL HFA;VENTOLIN HFA) 108 (90 BASE) MCG/ACT inhaler Inhale 2 puffs into the lungs every 6 (six) hours as needed for wheezing. 01/13/13   Kathyrn Drown, MD  allopurinol (ZYLOPRIM) 100 MG tablet TAKE (1) TABLET BY MOUTH ONCE DAILY FOR GOUT. 02/28/16   Kathyrn Drown, MD  amitriptyline (ELAVIL) 25 MG tablet TAKE  TWO TABLETS BY MOUTH DAILY AT BEDTIME 12/11/14   Historical Provider, MD  amLODipine (NORVASC) 10 MG tablet TAKE (1) TABLET BY MOUTH DAILY FOR HIGH BLOOD PRESSURE. 09/23/15   Kathyrn Drown, MD  ATRIPLA 600-200-300 MG per tablet TAKE ONE TABLET BY MOUTH ONCE IN THE EVENING 12/11/14   Historical Provider, MD  DESCOVY 200-25 MG tablet TAKE ONE TABLET BY MOUTH ONCE DAILY 04/07/15   Historical Provider, MD  dolutegravir (TIVICAY) 50 MG tablet Take 50 mg by mouth daily.  03/30/15 03/29/16  Historical Provider, MD  interferon beta-1b (BETASERON) 0.3 MG injection Inject 44 mg into the skin every Monday, Wednesday, and Friday.      Historical Provider, MD  latanoprost (XALATAN) 0.005 % ophthalmic solution Place 1 drop into the  left eye at bedtime.  07/15/14   Historical Provider, MD  lisinopril-hydrochlorothiazide (PRINZIDE,ZESTORETIC) 10-12.5 MG tablet TAKE (1) TABLET BY MOUTH DAILY FOR HIGH BLOOD PRESSURE. 02/28/16   Kathyrn Drown, MD  oxyCODONE-acetaminophen (PERCOCET/ROXICET) 5-325 MG tablet Take 1 tablet by mouth every 6 (six) hours as needed for severe pain. 02/02/16   Kathyrn Drown, MD  sertraline (ZOLOFT) 100 MG tablet Take 1 tablet (100 mg total) by mouth daily. 02/02/16   Kathyrn Drown, MD    Physical Exam: Vitals:   03/14/16 2035 03/15/16 0040  BP: 157/67 144/75  Pulse: 95 80  Resp: 16 16  Temp: 98.9 F (37.2 C)   TempSrc: Temporal   SpO2: 99% 97%  Weight: 95.3 kg (210 lb)   Height: 5' 8.5" (1.74 m)     Constitutional: NAD, calm, comfortable Vitals:   03/14/16 2035 03/15/16 0040  BP: 157/67 144/75  Pulse: 95 80  Resp: 16 16  Temp: 98.9 F (37.2 C)   TempSrc: Temporal   SpO2: 99% 97%  Weight: 95.3 kg (210 lb)   Height: 5' 8.5" (1.74 m)    Eyes: PERRL, lids and conjunctivae normal ENMT: Mucous membranes are moist. Posterior pharynx clear of any exudate or lesions.Normal dentition.  Neck: normal, supple, no masses, no thyromegaly Respiratory: clear to auscultation bilaterally, no wheezing, no crackles. Normal respiratory effort. No accessory muscle use.  Cardiovascular: Regular rate and rhythm, no murmurs / rubs / gallops. No extremity edema. 2+ pedal pulses. No carotid bruits.  Abdomen: no tenderness, no masses palpated. No hepatosplenomegaly. Bowel sounds positive.  Musculoskeletal: no clubbing / cyanosis. No joint deformity upper and lower extremities. Good ROM, no contractures. Normal muscle tone. Right knee swollen and painful Skin: no rashes, lesions, ulcers. No induration Neurologic: CN 2-12 grossly intact. Sensation intact, DTR normal. Strength 5/5 in all 4.  Psychiatric: Normal judgment and insight. Alert and oriented x 3. Normal mood.    Labs on Admission: I have personally  reviewed following labs and imaging studies  CBC:  Recent Labs Lab 03/15/16 0002  WBC 4.8  NEUTROABS 3.2  HGB 12.5*  HCT 38.9*  MCV 88.6  PLT 99991111   Basic Metabolic Panel:  Recent Labs Lab 03/15/16 0002  NA 139  K 4.0  CL 110  CO2 21*  GLUCOSE 126*  BUN 18  CREATININE 1.26*  CALCIUM 9.1   Urine analysis:    Component Value Date/Time   COLORURINE YELLOW 10/28/2012 [place] 10/28/2012 1444   LABSPEC 1.025 10/28/2012 1444   PHURINE 5.5 10/28/2012 1444   GLUCOSEU NEGATIVE 10/28/2012 1444   HGBUR SMALL (A) 10/28/2012 1444   BILIRUBINUR ++ 10/31/2012 1533   KETONESUR NEGATIVE 10/28/2012 1444  PROTEINUR 100 10/31/2012 1533   PROTEINUR TRACE (A) 10/28/2012 1444   UROBILINOGEN 0.2 10/28/2012 1444   NITRITE NEGATIVE 10/28/2012 1444   LEUKOCYTESUR Trace 10/31/2012 1533    Radiological Exams on Admission: Ct Knee Right Wo Contrast  Result Date: 03/15/2016 CLINICAL DATA:  70 year old male with fall and right knee pain. EXAM: CT OF THE right KNEE WITHOUT CONTRAST TECHNIQUE: Multidetector CT imaging of the right knee was performed according to the standard protocol. Multiplanar CT image reconstructions were also generated. COMPARISON:  Right knee radiograph dated 03/14/2016 FINDINGS: There is comminuted fracture of the proximal tibia with involvement of the anterior aspect of the lateral and medial tibial plateau. There is mild depression of the anterior aspect of the lateral tibial plateau. There is extension of the fracture into the lateral aspect of the medial tibial plateau anteriorly with mildly depressed anterior plateau fracture. There is extension of the fracture along the anterior cortex of the proximal tibial metaphysis. No other acute fracture identified. There is no dislocation. The bones are osteopenic. There is a moderate size suprapatellar lipohemarthrosis. There is induration of the Hoffa's fat pad. There is a small joint effusion in the posterior  knee compartment. There is inflammatory changes and induration of the fat surrounding the anterior cruciate ligament. An ACL injury or avulsion at the insertion on the intercondylar notch may be present. IMPRESSION: Mildly depressed comminuted fractures of the anterior aspects of the medial and lateral tibial plateau extending along the anterior cortex of the proximal tibial metaphysis. Moderate size suprapatellar lipohemarthrosis as well as posterior knee effusion. Induration of the Hoffa's fat pad as well as ACL with findings concerning for possible ACL injury or avulsion at the insertion on the intercondylar notch. Electronically Signed   By: Anner Crete M.D.   On: 03/15/2016 00:30   Dg Knee Complete 4 Views Right  Result Date: 03/14/2016 CLINICAL DATA:  Knee pain after falling in hole EXAM: RIGHT KNEE - COMPLETE 4+ VIEW COMPARISON:  07/19/2014 FINDINGS: There is a large lipohemarthrosis identified. Acute comminuted fracture deformity involving the medial tibial plateau is identified. No dislocation. IMPRESSION: Acute tibial plateau fracture. Suggest further evaluation with CT of the left knee. Lipohemarthrosis Electronically Signed   By: Kerby Moors M.D.   On: 03/14/2016 21:23    Assessment/Plan 70 yo male with mechanical fall and right tib platueau fracture  Principal Problem:   Inability to walk- obtain PT eval in am.  Place on oral oxycodone for better pain control.  Ortho to see in the am.  Suspect pain playing a large role in inability to move.  Active Problems:  Stable unless o/w noted   Fall at home   Tibial plateau fracture, right- dr Aline Brochure to see in the am.     HIV INFECTION   Essential hypertension   Multiple sclerosis ([place])   obs on medical.  May need rehab pending on PT eval.  DVT prophylaxis:   scds  Code Status:   full Family Communication:  none Disposition Plan:   Per day team Consults called:  Dr Aline Brochure with orthopedic surgery Admission status:   obs   Theophil,Hilaria Titsworth A MD [facility]  If 7PM-7AM, please contact night-coverage [email or web address] Password [email or web address]  03/15/2016, 1:02 AM

## 2016-03-15 NOTE — ED Notes (Signed)
Niece: Benancio Deeds [id number]

## 2016-03-15 NOTE — Care Management Note (Signed)
Case Management Note  Patient Details  Name: Kerry Solis MRN: [id number] Date of Birth: Mar 07, 1946  Subjective/Objective:                  Pt admitted for right tibial plateau fx. He is from home, lives alone and was ind with ADL's at baseline. He is now in a leg brace and NWB for 6 weeks. He has family who can assist him periodically at home. He has a daughter (Kerry Solis) who lives in [place]. He will need RW, WC and BSC. Pt can not get WC and RW, he has elected to get [facility] and will borrow RW from family. He has chosen [facility] for both DME and [place] services. Pt will need nursing, PT, OT, and aid. Romualdo Bolk, of [facility], is aware of referral and will obtain pt info from chart. She will deliver WC and BSC to pt room prior to DC. Anticipate DC home tomorrow. DC plan discussed with daughter Kerry Solis via phone.   Action/Plan: Will cont to follow.   Expected Discharge Date:       03/16/2016           Expected Discharge Plan:  [place]  In-House Referral:  NA  Discharge planning Services  CM Consult  Post Acute Care Choice:  Durable Medical Equipment, Home Health Choice offered to:  Patient  DME Arranged:  Bedside commode, Wheelchair manual DME Agency:  [facility] Arranged:  RN, PT, OT, Nurse's Aide [place] Agency:  [facility]  Status of Service:  In process, will continue to follow  If discussed at Long Length of Stay Meetings, dates discussed:    Additional Comments:  Sherald Barge, RN 03/15/2016, 2:48 PM

## 2016-03-15 NOTE — Progress Notes (Signed)
Patient ID: Kerry Solis, male   DOB: [birth year], 70 y.o.   MRN: [id number]   Initial consult note   Medial plateau fracture  [place] 4   He will be treated with bracing  PT can be initiated non weight bearing x 6 weeks

## 2016-03-16 DIAGNOSIS — R262 Difficulty in walking, not elsewhere classified: Secondary | ICD-10-CM | POA: Diagnosis not present

## 2016-03-16 DIAGNOSIS — B20 Human immunodeficiency virus [HIV] disease: Secondary | ICD-10-CM

## 2016-03-16 DIAGNOSIS — S82141A Displaced bicondylar fracture of right tibia, initial encounter for closed fracture: Secondary | ICD-10-CM | POA: Diagnosis not present

## 2016-03-16 DIAGNOSIS — I10 Essential (primary) hypertension: Secondary | ICD-10-CM

## 2016-03-16 NOTE — Care Management Note (Signed)
Case Management Note  Patient Details  Name: [name] MRN: [id number] Date of Birth: [birth year]   Expected Discharge Date:  03/16/16               Expected Discharge Plan:  [place]  In-House Referral:  NA  Discharge planning Services  CM Consult  Post Acute Care Choice:  Durable Medical Equipment, Home Health Choice offered to:  Patient  DME Arranged:  Bedside commode, Wheelchair manual DME Agency:  [place]:  RN, PT, OT, Nurse's Aide [place] Agency:  [place]  Status of Service:  Completed, signed off  If discussed at [place] of Stay Meetings, dates discussed:    Additional Comments: Pt discharging home today. He is aware that [facility] has 48hrs to make first appointment. Per daughter, she has contacted medicare who has told her they will pay for aid up to 60 hrs/per week. CM will notify [facility] that pt is interested in increased aid hrs. Will notify Romualdo Bolk, of [facility], that pt is discharged today, she will obtain pt info from chart. BSC and WC has been delivered to pt room prior to DC.  Sherald Barge, RN 03/16/2016, 1:18 PM

## 2016-03-16 NOTE — Progress Notes (Signed)
Pt's IV catheter removed and intact. Pt's IV site clean dry and intact. Discharge instructions including medications and follow up appointments were reviewed and discussed with patient. All questions were answered and no further questions at this time. Pt verbalized understanding of discharge instructions including medications and follow up appointments. Pt in stable condition and in no acute distress at time of discharge. Pt will be escorted by nurse tech.  

## 2016-03-16 NOTE — Discharge Summary (Signed)
Physician Discharge Summary  Kerry Solis [id number] DOB: [birth year] DOA: 03/14/2016  PCP: Sallee Lange, MD  Admit date: 03/14/2016 Discharge date: 03/16/2016  Admitted From: Home Disposition:  Home.   Recommendations for Outpatient Follow-up:  1. Follow up with PCP in 1-2 weeks 2. Follow up with Dr Aline Brochure of orthopedics in 2-3 weeks.   Home Health: OT/PT/RN/HHA. Equipment/Devices: brace.  Discharge Condition: stable.  CODE STATUS:FULL CODE.  Diet recommendation: cardiac diet.   Brief/Interim Summary:  Patient was admitted by Dr Shanon Brow on Sept 28, 2017 for tibial plateau Fx from a fall.   As per her H and P:  "  Kerry Solis is a 71 y.o. male with medical history significant of multiple sclerosis, HIV walks with a cane at baseline comes in after tripping on a hole in his yard today and injuring his right knee.  Pt was found to have right tib plateau fracture, dr Aline Brochure was called and advised follow up with him in office however pt could not get up and ambulate with crutches.  He was therefore referred for admission.  Pt lives alone.  HOSPITAL COURSE:  Patient was given IV pain medicaition, and he was seen in consultation with Dr Aline Brochure.  His injury did not require surgical intervention, and Dr Aline Brochure recommended NWB for 6 weeks and to have physical therapy.  He did not want nor qualified to be accept into rehab. Therefore, HHA,  OT, PT, and RN was arranged for him.  He will be discharged to home and follow up with his PCP and Dr Aline Brochure as scheduled.  He has been on narcotics previously, and had narcotics at home. His other medical problems were stable, and his home meds were continued.  Further pain medication will be provided by his PCP as previously.  He is stable for discharge, and will be discharge home today.  Thank you and Good Day.   Discharge Diagnoses:  Principal Problem:   Inability to walk Active Problems:   HIV INFECTION   Essential hypertension   Fall at  home   Multiple sclerosis [facility])   Tibial plateau fracture, right   Right medial tibial plateau fracture   Fall    Discharge Instructions  Discharge Instructions    Diet - low sodium heart healthy    Complete by:  As directed    Increase activity slowly    Complete by:  As directed        Medication List    TAKE these medications   acetaminophen 500 MG tablet Commonly known as:  TYLENOL Take 1,000 mg by mouth 3 (three) times daily as needed for mild pain or moderate pain.   allopurinol 100 MG tablet Commonly known as:  ZYLOPRIM TAKE (1) TABLET BY MOUTH ONCE DAILY FOR GOUT.   amitriptyline 25 MG tablet Commonly known as:  ELAVIL TAKE TWO TABLETS BY MOUTH DAILY AT BEDTIME   amLODipine 10 MG tablet Commonly known as:  NORVASC TAKE (1) TABLET BY MOUTH DAILY FOR HIGH BLOOD PRESSURE.   aspirin EC 81 MG tablet Take 81 mg by mouth daily.   DESCOVY 200-25 MG tablet Generic drug:  emtricitabine-tenofovir AF TAKE ONE TABLET BY MOUTH ONCE DAILY   folic acid 1 MG tablet Commonly known as:  FOLVITE Take 1 mg by mouth daily.   latanoprost 0.005 % ophthalmic solution Commonly known as:  XALATAN Place 1 drop into the left eye at bedtime.   lisinopril-hydrochlorothiazide 10-12.5 MG tablet Commonly known as:  PRINZIDE,ZESTORETIC TAKE (1)  TABLET BY MOUTH DAILY FOR HIGH BLOOD PRESSURE.   multivitamin with minerals Tabs tablet Take 1 tablet by mouth daily.   oxyCODONE-acetaminophen 5-325 MG tablet Commonly known as:  PERCOCET/ROXICET Take 1 tablet by mouth every 6 (six) hours as needed for severe pain.   pravastatin 40 MG tablet Commonly known as:  PRAVACHOL TAKE 1 TABLET BY MOUTH AT BEDTIME FOR CHOLESTEROL What changed:  how much to take  how to take this  when to take this  additional instructions   REBIF 44 MCG/0.5ML Sosy injection Generic drug:  interferon beta-1a Inject 44 mcg into the skin 3 (three) times a week.   sertraline 100 MG tablet Commonly  known as:  ZOLOFT Take 1 tablet (100 mg total) by mouth daily.   TIVICAY 50 MG tablet Generic drug:  dolutegravir Take 50 mg by mouth daily.   VITAMIN B-6 PO Take 1 tablet by mouth daily.   VITAMIN D PO Take 1 tablet by mouth daily.      Follow-up Information    [facility] .   Contact information: [place] [address] [phone]          Allergies  Allergen Reactions  . [place] Venom Swelling    Consultations:  Orthopedics.    Procedures/Studies: Ct Knee Right Wo Contrast  Result Date: 03/15/2016 CLINICAL DATA:  70 year old male with fall and right knee pain. EXAM: CT OF THE right KNEE WITHOUT CONTRAST TECHNIQUE: Multidetector CT imaging of the right knee was performed according to the standard protocol. Multiplanar CT image reconstructions were also generated. COMPARISON:  Right knee radiograph dated 03/14/2016 FINDINGS: There is comminuted fracture of the proximal tibia with involvement of the anterior aspect of the lateral and medial tibial plateau. There is mild depression of the anterior aspect of the lateral tibial plateau. There is extension of the fracture into the lateral aspect of the medial tibial plateau anteriorly with mildly depressed anterior plateau fracture. There is extension of the fracture along the anterior cortex of the proximal tibial metaphysis. No other acute fracture identified. There is no dislocation. The bones are osteopenic. There is a moderate size suprapatellar lipohemarthrosis. There is induration of the Hoffa's fat pad. There is a small joint effusion in the posterior knee compartment. There is inflammatory changes and induration of the fat surrounding the anterior cruciate ligament. An ACL injury or avulsion at the insertion on the intercondylar notch may be present. IMPRESSION: Mildly depressed comminuted fractures of the anterior aspects of the medial and lateral tibial plateau extending  along the anterior cortex of the proximal tibial metaphysis. Moderate size suprapatellar lipohemarthrosis as well as posterior knee effusion. Induration of the Hoffa's fat pad as well as ACL with findings concerning for possible ACL injury or avulsion at the insertion on the intercondylar notch. Electronically Signed   By: Anner Crete M.D.   On: 03/15/2016 00:30   Dg Knee Complete 4 Views Right  Result Date: 03/14/2016 CLINICAL DATA:  Knee pain after falling in hole EXAM: RIGHT KNEE - COMPLETE 4+ VIEW COMPARISON:  07/19/2014 FINDINGS: There is a large lipohemarthrosis identified. Acute comminuted fracture deformity involving the medial tibial plateau is identified. No dislocation. IMPRESSION: Acute tibial plateau fracture. Suggest further evaluation with CT of the left knee. Lipohemarthrosis Electronically Signed   By: Kerby Moors M.D.   On: 03/14/2016 21:23       Subjective: Feels better.  Pain is adequately controlled.    Discharge Exam: Vitals:  03/15/16 2146 03/16/16 0551  BP: (!) 145/73 (!) 161/75  Pulse: 92 80  Resp: 18 19  Temp: 99.2 F (37.3 C) 98.8 F (37.1 C)   Vitals:   03/15/16 1350 03/15/16 2146 03/16/16 0551 03/16/16 0700  BP: (!) 160/80 (!) 145/73 (!) 161/75   Pulse:  92 80   Resp:  18 19   Temp:  99.2 F (37.3 C) 98.8 F (37.1 C)   TempSrc:  Oral Oral   SpO2:  98% 99% 97%  Weight:      Height:        General: Pt is alert, awake, not in acute distress Cardiovascular: RRR, S1/S2 +, no rubs, no gallops Respiratory: CTA bilaterally, no wheezing, no rhonchi Abdominal: Soft, NT, ND, bowel sounds + Extremities: no edema, no cyanosis    The results of significant diagnostics from this hospitalization (including imaging, microbiology, ancillary and laboratory) are listed below for reference.    Basic Metabolic Panel:  Recent Labs Lab 03/15/16 0002  NA 139  K 4.0  CL 110  CO2 21*  GLUCOSE 126*  BUN 18  CREATININE 1.26*  CALCIUM 9.1   Liver  Function Tests: CBC:  Recent Labs Lab 03/15/16 0002  WBC 4.8  NEUTROABS 3.2  HGB 12.5*  HCT 38.9*  MCV 88.6  PLT 191   Urinalysis    Component Value Date/Time   COLORURINE YELLOW 10/28/2012 1444   APPEARANCEUR CLEAR 10/28/2012 1444   LABSPEC 1.025 10/28/2012 1444   PHURINE 5.5 10/28/2012 1444   GLUCOSEU NEGATIVE 10/28/2012 1444   HGBUR SMALL (A) 10/28/2012 1444   BILIRUBINUR ++ 10/31/2012 1533   KETONESUR NEGATIVE 10/28/2012 1444   PROTEINUR 100 10/31/2012 1533   PROTEINUR TRACE (A) 10/28/2012 1444   UROBILINOGEN 0.2 10/28/2012 1444   NITRITE NEGATIVE 10/28/2012 1444   LEUKOCYTESUR Trace 10/31/2012 1533   Time coordinating discharge: Over 30 minutes  SIGNED:   Orvan Falconer, MD FACP [facility] 03/16/2016, 11:00 AM   If 7PM-7AM, please contact night-coverage [email or web address] Password [email or web address]

## 2016-03-19 ENCOUNTER — Telehealth: Payer: Self-pay | Admitting: *Deleted

## 2016-03-19 ENCOUNTER — Other Ambulatory Visit: Payer: Self-pay | Admitting: *Deleted

## 2016-03-19 DIAGNOSIS — I252 Old myocardial infarction: Secondary | ICD-10-CM | POA: Diagnosis not present

## 2016-03-19 DIAGNOSIS — M109 Gout, unspecified: Secondary | ICD-10-CM | POA: Diagnosis not present

## 2016-03-19 DIAGNOSIS — H409 Unspecified glaucoma: Secondary | ICD-10-CM | POA: Diagnosis not present

## 2016-03-19 DIAGNOSIS — W19XXXD Unspecified fall, subsequent encounter: Secondary | ICD-10-CM | POA: Diagnosis not present

## 2016-03-19 DIAGNOSIS — Z21 Asymptomatic human immunodeficiency virus [HIV] infection status: Secondary | ICD-10-CM | POA: Diagnosis not present

## 2016-03-19 DIAGNOSIS — S82141D Displaced bicondylar fracture of right tibia, subsequent encounter for closed fracture with routine healing: Secondary | ICD-10-CM | POA: Diagnosis not present

## 2016-03-19 DIAGNOSIS — G894 Chronic pain syndrome: Secondary | ICD-10-CM | POA: Diagnosis not present

## 2016-03-19 DIAGNOSIS — F341 Dysthymic disorder: Secondary | ICD-10-CM | POA: Diagnosis not present

## 2016-03-19 DIAGNOSIS — G35 Multiple sclerosis: Secondary | ICD-10-CM | POA: Diagnosis not present

## 2016-03-19 MED ORDER — OXYCODONE HCL 5 MG PO TABS: 5.0000 mg | ORAL_TABLET | Freq: Four times a day (QID) | ORAL | 0 refills | Status: DC | PRN

## 2016-03-19 NOTE — Telephone Encounter (Signed)
Pt called to schedule hospital follow up this week. appt on Friday. Dr Nicki Reaper spoke with pt and gave rx for oxycodone 5mg  #40 one every 6 hours. Script upfront for pickup.

## 2016-03-19 NOTE — Telephone Encounter (Signed)
I discussed with the patient recent injury. Being treated for a fracture. He is wheelchair-bound and using a walker some. Patient was given a prescription for pain medicine to use at home only. We will follow him up later this week. Patient's Hospital notes were reviewed

## 2016-03-20 DIAGNOSIS — S82141D Displaced bicondylar fracture of right tibia, subsequent encounter for closed fracture with routine healing: Secondary | ICD-10-CM | POA: Diagnosis not present

## 2016-03-20 DIAGNOSIS — G894 Chronic pain syndrome: Secondary | ICD-10-CM | POA: Diagnosis not present

## 2016-03-20 DIAGNOSIS — G35 Multiple sclerosis: Secondary | ICD-10-CM | POA: Diagnosis not present

## 2016-03-20 DIAGNOSIS — H409 Unspecified glaucoma: Secondary | ICD-10-CM | POA: Diagnosis not present

## 2016-03-20 DIAGNOSIS — F341 Dysthymic disorder: Secondary | ICD-10-CM | POA: Diagnosis not present

## 2016-03-20 DIAGNOSIS — M109 Gout, unspecified: Secondary | ICD-10-CM | POA: Diagnosis not present

## 2016-03-21 DIAGNOSIS — S82141D Displaced bicondylar fracture of right tibia, subsequent encounter for closed fracture with routine healing: Secondary | ICD-10-CM | POA: Diagnosis not present

## 2016-03-21 DIAGNOSIS — G894 Chronic pain syndrome: Secondary | ICD-10-CM | POA: Diagnosis not present

## 2016-03-21 DIAGNOSIS — F341 Dysthymic disorder: Secondary | ICD-10-CM | POA: Diagnosis not present

## 2016-03-21 DIAGNOSIS — G35 Multiple sclerosis: Secondary | ICD-10-CM | POA: Diagnosis not present

## 2016-03-21 DIAGNOSIS — M109 Gout, unspecified: Secondary | ICD-10-CM | POA: Diagnosis not present

## 2016-03-21 DIAGNOSIS — H409 Unspecified glaucoma: Secondary | ICD-10-CM | POA: Diagnosis not present

## 2016-03-22 DIAGNOSIS — G35 Multiple sclerosis: Secondary | ICD-10-CM | POA: Diagnosis not present

## 2016-03-22 DIAGNOSIS — M109 Gout, unspecified: Secondary | ICD-10-CM | POA: Diagnosis not present

## 2016-03-22 DIAGNOSIS — F341 Dysthymic disorder: Secondary | ICD-10-CM | POA: Diagnosis not present

## 2016-03-22 DIAGNOSIS — G894 Chronic pain syndrome: Secondary | ICD-10-CM | POA: Diagnosis not present

## 2016-03-22 DIAGNOSIS — S82141D Displaced bicondylar fracture of right tibia, subsequent encounter for closed fracture with routine healing: Secondary | ICD-10-CM | POA: Diagnosis not present

## 2016-03-22 DIAGNOSIS — H409 Unspecified glaucoma: Secondary | ICD-10-CM | POA: Diagnosis not present

## 2016-03-23 ENCOUNTER — Telehealth: Payer: Self-pay | Admitting: Family Medicine

## 2016-03-23 ENCOUNTER — Encounter: Payer: Self-pay | Admitting: Family Medicine

## 2016-03-23 ENCOUNTER — Ambulatory Visit (INDEPENDENT_AMBULATORY_CARE_PROVIDER_SITE_OTHER): Payer: Medicare Other | Admitting: Family Medicine

## 2016-03-23 VITALS — Ht 68.5 in | Wt 213.0 lb

## 2016-03-23 DIAGNOSIS — F341 Dysthymic disorder: Secondary | ICD-10-CM | POA: Diagnosis not present

## 2016-03-23 DIAGNOSIS — M109 Gout, unspecified: Secondary | ICD-10-CM | POA: Diagnosis not present

## 2016-03-23 DIAGNOSIS — G35 Multiple sclerosis: Secondary | ICD-10-CM | POA: Diagnosis not present

## 2016-03-23 DIAGNOSIS — S82141D Displaced bicondylar fracture of right tibia, subsequent encounter for closed fracture with routine healing: Secondary | ICD-10-CM | POA: Diagnosis not present

## 2016-03-23 DIAGNOSIS — R972 Elevated prostate specific antigen [PSA]: Secondary | ICD-10-CM

## 2016-03-23 DIAGNOSIS — H409 Unspecified glaucoma: Secondary | ICD-10-CM | POA: Diagnosis not present

## 2016-03-23 DIAGNOSIS — Z23 Encounter for immunization: Secondary | ICD-10-CM | POA: Diagnosis not present

## 2016-03-23 DIAGNOSIS — G894 Chronic pain syndrome: Secondary | ICD-10-CM | POA: Diagnosis not present

## 2016-03-23 MED ORDER — OXYCODONE-ACETAMINOPHEN 10-325 MG PO TABS: 1.0000 | ORAL_TABLET | ORAL | 0 refills | Status: DC | PRN

## 2016-03-23 NOTE — Telephone Encounter (Signed)
Patient was to call back and tell us the name of the organization that helps him.  He says he used [place].

## 2016-03-23 NOTE — Progress Notes (Signed)
   Subjective:    Patient ID: Kerry Solis, male    DOB: 1946-04-16, 70 y.o.   MRN: [id number]  HPI  Upon discharge the patient was called by our office a spoke with him regarding how he was doing making sure his medications were okay plus also seen if he had any needs at that time he did not in this appointment was set up at that point. Patient arrives for a follow up from recent hospitalizationThis patient is been having a rough time at home. Face-to-face evaluation done for purpose of home health Patient has been taking his medications as prescribed but states the pain medicine is not touching his pain he is using the 5 mg oxycodone 4 times per day denies any constipation with it Patient does state his appetite is fair He is unable to get up and move around is set for when using a walker and only limited steps He is unable to bathe himself said therefore 8 is coming to his house He has a nephew and a friend staying at the house but they truly don't help him much one of the members works a lot. The other one typically is just therefore companion ship but does not help with activities of daily living. Patient is unable to prep his food unable to wash clothing or do light housework. We also had a recent discussion about his elevated PSA he we had referred him to urology apparently he has an appointment coming up but he thinks its can be difficult for him to make that appointment.  Review of Systems See above.    Objective:   Physical Exam Lungs are clear hearts regular pulse normal abdomen obese right leg is wearing a splint currently no swelling.       Assessment & Plan:  Tibia plateau fracture- leg pain-importance of following the direction of orthopedics. Physical therapy is coming out to his house to work with him. He needs to work on upper body strength and balance. He does need home health and physical therapy.  Pain control-oxycodone 10 mg every 4 when necessary prescription for  50 tablets given when he needs more he is to let us know keep medication in a safe spot caution drowsiness stool softeners recommended  I did discuss case with his daughter who once his dad to have more help at home. I believe this patient would benefit from having more help the patient is to find out who the home health agency is working with him and we will connect with them to get their case manager to evaluate if he has more helpers.  The patient will follow-up in approximately 4 weeks.   Transitional care visit completed-we will be working with setting him up with orthopedics working with trying to get him more home health care. May also had to reschedule his urology appointment because of elevated PSA.  I did speak with his daughter. Also patient getting [facility] through [facility]. We will see if they were able to increase the amount of hours they are spending with the patient.

## 2016-03-25 NOTE — Telephone Encounter (Signed)
This patient needs more assistance. I would like to speak with his nurse or case manager for this patient to the home health agency

## 2016-03-26 DIAGNOSIS — F341 Dysthymic disorder: Secondary | ICD-10-CM | POA: Diagnosis not present

## 2016-03-26 DIAGNOSIS — M109 Gout, unspecified: Secondary | ICD-10-CM | POA: Diagnosis not present

## 2016-03-26 DIAGNOSIS — G894 Chronic pain syndrome: Secondary | ICD-10-CM | POA: Diagnosis not present

## 2016-03-26 DIAGNOSIS — S82141D Displaced bicondylar fracture of right tibia, subsequent encounter for closed fracture with routine healing: Secondary | ICD-10-CM | POA: Diagnosis not present

## 2016-03-26 DIAGNOSIS — G35 Multiple sclerosis: Secondary | ICD-10-CM | POA: Diagnosis not present

## 2016-03-26 DIAGNOSIS — H409 Unspecified glaucoma: Secondary | ICD-10-CM | POA: Diagnosis not present

## 2016-03-26 NOTE — Telephone Encounter (Signed)
[name] talked with case manager Gwynne Edinger)

## 2016-03-26 NOTE — Telephone Encounter (Signed)
Per my discussion with the case manager Dass the case manager will have the social worker see the patient to see if he qualifies for other health help

## 2016-03-27 DIAGNOSIS — G35 Multiple sclerosis: Secondary | ICD-10-CM | POA: Diagnosis not present

## 2016-03-27 DIAGNOSIS — S82141D Displaced bicondylar fracture of right tibia, subsequent encounter for closed fracture with routine healing: Secondary | ICD-10-CM | POA: Diagnosis not present

## 2016-03-27 DIAGNOSIS — G894 Chronic pain syndrome: Secondary | ICD-10-CM | POA: Diagnosis not present

## 2016-03-27 DIAGNOSIS — H409 Unspecified glaucoma: Secondary | ICD-10-CM | POA: Diagnosis not present

## 2016-03-27 DIAGNOSIS — M109 Gout, unspecified: Secondary | ICD-10-CM | POA: Diagnosis not present

## 2016-03-27 DIAGNOSIS — F341 Dysthymic disorder: Secondary | ICD-10-CM | POA: Diagnosis not present

## 2016-03-28 DIAGNOSIS — H409 Unspecified glaucoma: Secondary | ICD-10-CM | POA: Diagnosis not present

## 2016-03-28 DIAGNOSIS — G894 Chronic pain syndrome: Secondary | ICD-10-CM | POA: Diagnosis not present

## 2016-03-28 DIAGNOSIS — G35 Multiple sclerosis: Secondary | ICD-10-CM | POA: Diagnosis not present

## 2016-03-28 DIAGNOSIS — M109 Gout, unspecified: Secondary | ICD-10-CM | POA: Diagnosis not present

## 2016-03-28 DIAGNOSIS — S82141D Displaced bicondylar fracture of right tibia, subsequent encounter for closed fracture with routine healing: Secondary | ICD-10-CM | POA: Diagnosis not present

## 2016-03-28 DIAGNOSIS — F341 Dysthymic disorder: Secondary | ICD-10-CM | POA: Diagnosis not present

## 2016-03-29 ENCOUNTER — Other Ambulatory Visit: Payer: Self-pay | Admitting: Family Medicine

## 2016-03-29 DIAGNOSIS — S82141D Displaced bicondylar fracture of right tibia, subsequent encounter for closed fracture with routine healing: Secondary | ICD-10-CM | POA: Diagnosis not present

## 2016-03-29 DIAGNOSIS — M109 Gout, unspecified: Secondary | ICD-10-CM | POA: Diagnosis not present

## 2016-03-29 DIAGNOSIS — H409 Unspecified glaucoma: Secondary | ICD-10-CM | POA: Diagnosis not present

## 2016-03-29 DIAGNOSIS — G35 Multiple sclerosis: Secondary | ICD-10-CM | POA: Diagnosis not present

## 2016-03-29 DIAGNOSIS — F341 Dysthymic disorder: Secondary | ICD-10-CM | POA: Diagnosis not present

## 2016-03-29 DIAGNOSIS — G894 Chronic pain syndrome: Secondary | ICD-10-CM | POA: Diagnosis not present

## 2016-03-30 DIAGNOSIS — S82141D Displaced bicondylar fracture of right tibia, subsequent encounter for closed fracture with routine healing: Secondary | ICD-10-CM | POA: Diagnosis not present

## 2016-03-30 DIAGNOSIS — F341 Dysthymic disorder: Secondary | ICD-10-CM | POA: Diagnosis not present

## 2016-03-30 DIAGNOSIS — M109 Gout, unspecified: Secondary | ICD-10-CM | POA: Diagnosis not present

## 2016-03-30 DIAGNOSIS — G35 Multiple sclerosis: Secondary | ICD-10-CM | POA: Diagnosis not present

## 2016-03-30 DIAGNOSIS — G894 Chronic pain syndrome: Secondary | ICD-10-CM | POA: Diagnosis not present

## 2016-03-30 DIAGNOSIS — H409 Unspecified glaucoma: Secondary | ICD-10-CM | POA: Diagnosis not present

## 2016-04-02 DIAGNOSIS — S82141D Displaced bicondylar fracture of right tibia, subsequent encounter for closed fracture with routine healing: Secondary | ICD-10-CM | POA: Diagnosis not present

## 2016-04-02 DIAGNOSIS — F341 Dysthymic disorder: Secondary | ICD-10-CM | POA: Diagnosis not present

## 2016-04-02 DIAGNOSIS — G894 Chronic pain syndrome: Secondary | ICD-10-CM | POA: Diagnosis not present

## 2016-04-02 DIAGNOSIS — H409 Unspecified glaucoma: Secondary | ICD-10-CM | POA: Diagnosis not present

## 2016-04-02 DIAGNOSIS — M109 Gout, unspecified: Secondary | ICD-10-CM | POA: Diagnosis not present

## 2016-04-02 DIAGNOSIS — G35 Multiple sclerosis: Secondary | ICD-10-CM | POA: Diagnosis not present

## 2016-04-03 DIAGNOSIS — F341 Dysthymic disorder: Secondary | ICD-10-CM | POA: Diagnosis not present

## 2016-04-03 DIAGNOSIS — G894 Chronic pain syndrome: Secondary | ICD-10-CM | POA: Diagnosis not present

## 2016-04-03 DIAGNOSIS — S82141D Displaced bicondylar fracture of right tibia, subsequent encounter for closed fracture with routine healing: Secondary | ICD-10-CM | POA: Diagnosis not present

## 2016-04-03 DIAGNOSIS — G35 Multiple sclerosis: Secondary | ICD-10-CM | POA: Diagnosis not present

## 2016-04-03 DIAGNOSIS — H409 Unspecified glaucoma: Secondary | ICD-10-CM | POA: Diagnosis not present

## 2016-04-03 DIAGNOSIS — M109 Gout, unspecified: Secondary | ICD-10-CM | POA: Diagnosis not present

## 2016-04-04 DIAGNOSIS — M25561 Pain in right knee: Secondary | ICD-10-CM | POA: Diagnosis not present

## 2016-04-04 DIAGNOSIS — B20 Human immunodeficiency virus [HIV] disease: Secondary | ICD-10-CM | POA: Diagnosis not present

## 2016-04-04 DIAGNOSIS — G35 Multiple sclerosis: Secondary | ICD-10-CM | POA: Diagnosis not present

## 2016-04-04 DIAGNOSIS — Z792 Long term (current) use of antibiotics: Secondary | ICD-10-CM | POA: Diagnosis not present

## 2016-04-04 DIAGNOSIS — K7581 Nonalcoholic steatohepatitis (NASH): Secondary | ICD-10-CM | POA: Diagnosis not present

## 2016-04-04 DIAGNOSIS — I10 Essential (primary) hypertension: Secondary | ICD-10-CM | POA: Diagnosis not present

## 2016-04-04 DIAGNOSIS — F418 Other specified anxiety disorders: Secondary | ICD-10-CM | POA: Diagnosis not present

## 2016-04-05 DIAGNOSIS — M109 Gout, unspecified: Secondary | ICD-10-CM | POA: Diagnosis not present

## 2016-04-05 DIAGNOSIS — G894 Chronic pain syndrome: Secondary | ICD-10-CM | POA: Diagnosis not present

## 2016-04-05 DIAGNOSIS — G35 Multiple sclerosis: Secondary | ICD-10-CM | POA: Diagnosis not present

## 2016-04-05 DIAGNOSIS — S82141D Displaced bicondylar fracture of right tibia, subsequent encounter for closed fracture with routine healing: Secondary | ICD-10-CM | POA: Diagnosis not present

## 2016-04-05 DIAGNOSIS — H409 Unspecified glaucoma: Secondary | ICD-10-CM | POA: Diagnosis not present

## 2016-04-05 DIAGNOSIS — F341 Dysthymic disorder: Secondary | ICD-10-CM | POA: Diagnosis not present

## 2016-04-06 DIAGNOSIS — F341 Dysthymic disorder: Secondary | ICD-10-CM | POA: Diagnosis not present

## 2016-04-06 DIAGNOSIS — G894 Chronic pain syndrome: Secondary | ICD-10-CM | POA: Diagnosis not present

## 2016-04-06 DIAGNOSIS — G35 Multiple sclerosis: Secondary | ICD-10-CM | POA: Diagnosis not present

## 2016-04-06 DIAGNOSIS — H409 Unspecified glaucoma: Secondary | ICD-10-CM | POA: Diagnosis not present

## 2016-04-06 DIAGNOSIS — M109 Gout, unspecified: Secondary | ICD-10-CM | POA: Diagnosis not present

## 2016-04-06 DIAGNOSIS — S82141D Displaced bicondylar fracture of right tibia, subsequent encounter for closed fracture with routine healing: Secondary | ICD-10-CM | POA: Diagnosis not present

## 2016-04-09 DIAGNOSIS — G894 Chronic pain syndrome: Secondary | ICD-10-CM | POA: Diagnosis not present

## 2016-04-09 DIAGNOSIS — G35 Multiple sclerosis: Secondary | ICD-10-CM | POA: Diagnosis not present

## 2016-04-09 DIAGNOSIS — F341 Dysthymic disorder: Secondary | ICD-10-CM | POA: Diagnosis not present

## 2016-04-09 DIAGNOSIS — S82141D Displaced bicondylar fracture of right tibia, subsequent encounter for closed fracture with routine healing: Secondary | ICD-10-CM | POA: Diagnosis not present

## 2016-04-09 DIAGNOSIS — M109 Gout, unspecified: Secondary | ICD-10-CM | POA: Diagnosis not present

## 2016-04-09 DIAGNOSIS — H409 Unspecified glaucoma: Secondary | ICD-10-CM | POA: Diagnosis not present

## 2016-04-10 ENCOUNTER — Telehealth: Payer: Self-pay | Admitting: Family Medicine

## 2016-04-10 DIAGNOSIS — G35 Multiple sclerosis: Secondary | ICD-10-CM | POA: Diagnosis not present

## 2016-04-10 DIAGNOSIS — H409 Unspecified glaucoma: Secondary | ICD-10-CM | POA: Diagnosis not present

## 2016-04-10 DIAGNOSIS — G894 Chronic pain syndrome: Secondary | ICD-10-CM | POA: Diagnosis not present

## 2016-04-10 DIAGNOSIS — R972 Elevated prostate specific antigen [PSA]: Secondary | ICD-10-CM

## 2016-04-10 DIAGNOSIS — S82141D Displaced bicondylar fracture of right tibia, subsequent encounter for closed fracture with routine healing: Secondary | ICD-10-CM | POA: Diagnosis not present

## 2016-04-10 DIAGNOSIS — F341 Dysthymic disorder: Secondary | ICD-10-CM | POA: Diagnosis not present

## 2016-04-10 DIAGNOSIS — M109 Gout, unspecified: Secondary | ICD-10-CM | POA: Diagnosis not present

## 2016-04-10 NOTE — Telephone Encounter (Addendum)
New referral ordered in EPIC. Patient notified.

## 2016-04-10 NOTE — Telephone Encounter (Signed)
Give new referral

## 2016-04-10 NOTE — Telephone Encounter (Signed)
Patient was referred to Dr. Karsten [facility] in August.  He set up an appointment and he no-showed last Friday because he couldn't find a ride to the appointment.  He tried calling to reschedule, but they are requiring he start over and have a new referral completed by his PCP.  Please advise.

## 2016-04-11 DIAGNOSIS — G894 Chronic pain syndrome: Secondary | ICD-10-CM | POA: Diagnosis not present

## 2016-04-11 DIAGNOSIS — G35 Multiple sclerosis: Secondary | ICD-10-CM | POA: Diagnosis not present

## 2016-04-11 DIAGNOSIS — I252 Old myocardial infarction: Secondary | ICD-10-CM | POA: Diagnosis not present

## 2016-04-11 DIAGNOSIS — Z21 Asymptomatic human immunodeficiency virus [HIV] infection status: Secondary | ICD-10-CM | POA: Diagnosis not present

## 2016-04-11 DIAGNOSIS — M109 Gout, unspecified: Secondary | ICD-10-CM | POA: Diagnosis not present

## 2016-04-11 DIAGNOSIS — F341 Dysthymic disorder: Secondary | ICD-10-CM | POA: Diagnosis not present

## 2016-04-11 DIAGNOSIS — H409 Unspecified glaucoma: Secondary | ICD-10-CM | POA: Diagnosis not present

## 2016-04-11 DIAGNOSIS — S82141D Displaced bicondylar fracture of right tibia, subsequent encounter for closed fracture with routine healing: Secondary | ICD-10-CM | POA: Diagnosis not present

## 2016-04-11 DIAGNOSIS — W19XXXD Unspecified fall, subsequent encounter: Secondary | ICD-10-CM | POA: Diagnosis not present

## 2016-04-12 ENCOUNTER — Telehealth: Payer: Self-pay | Admitting: Family Medicine

## 2016-04-12 DIAGNOSIS — F341 Dysthymic disorder: Secondary | ICD-10-CM | POA: Diagnosis not present

## 2016-04-12 DIAGNOSIS — G894 Chronic pain syndrome: Secondary | ICD-10-CM | POA: Diagnosis not present

## 2016-04-12 DIAGNOSIS — M109 Gout, unspecified: Secondary | ICD-10-CM | POA: Diagnosis not present

## 2016-04-12 DIAGNOSIS — H409 Unspecified glaucoma: Secondary | ICD-10-CM | POA: Diagnosis not present

## 2016-04-12 DIAGNOSIS — S82141D Displaced bicondylar fracture of right tibia, subsequent encounter for closed fracture with routine healing: Secondary | ICD-10-CM | POA: Diagnosis not present

## 2016-04-12 DIAGNOSIS — G35 Multiple sclerosis: Secondary | ICD-10-CM | POA: Diagnosis not present

## 2016-04-12 NOTE — Telephone Encounter (Signed)
Wendelyn Breslow, nurse with [facility] called to let us know that pt had a fall 2 days ago, denies injury, says his leg is ok  They just wanted to notify our office  Questions, call [phone]

## 2016-04-13 ENCOUNTER — Encounter: Payer: Self-pay | Admitting: Family Medicine

## 2016-04-13 DIAGNOSIS — G894 Chronic pain syndrome: Secondary | ICD-10-CM | POA: Diagnosis not present

## 2016-04-13 DIAGNOSIS — S82141D Displaced bicondylar fracture of right tibia, subsequent encounter for closed fracture with routine healing: Secondary | ICD-10-CM | POA: Diagnosis not present

## 2016-04-13 DIAGNOSIS — G35 Multiple sclerosis: Secondary | ICD-10-CM | POA: Diagnosis not present

## 2016-04-13 DIAGNOSIS — F341 Dysthymic disorder: Secondary | ICD-10-CM | POA: Diagnosis not present

## 2016-04-13 DIAGNOSIS — M109 Gout, unspecified: Secondary | ICD-10-CM | POA: Diagnosis not present

## 2016-04-13 DIAGNOSIS — H409 Unspecified glaucoma: Secondary | ICD-10-CM | POA: Diagnosis not present

## 2016-04-17 DIAGNOSIS — G894 Chronic pain syndrome: Secondary | ICD-10-CM | POA: Diagnosis not present

## 2016-04-17 DIAGNOSIS — H409 Unspecified glaucoma: Secondary | ICD-10-CM | POA: Diagnosis not present

## 2016-04-17 DIAGNOSIS — M109 Gout, unspecified: Secondary | ICD-10-CM | POA: Diagnosis not present

## 2016-04-17 DIAGNOSIS — G35 Multiple sclerosis: Secondary | ICD-10-CM | POA: Diagnosis not present

## 2016-04-17 DIAGNOSIS — F341 Dysthymic disorder: Secondary | ICD-10-CM | POA: Diagnosis not present

## 2016-04-17 DIAGNOSIS — S82141D Displaced bicondylar fracture of right tibia, subsequent encounter for closed fracture with routine healing: Secondary | ICD-10-CM | POA: Diagnosis not present

## 2016-04-19 DIAGNOSIS — G894 Chronic pain syndrome: Secondary | ICD-10-CM | POA: Diagnosis not present

## 2016-04-19 DIAGNOSIS — M109 Gout, unspecified: Secondary | ICD-10-CM | POA: Diagnosis not present

## 2016-04-19 DIAGNOSIS — G35 Multiple sclerosis: Secondary | ICD-10-CM | POA: Diagnosis not present

## 2016-04-19 DIAGNOSIS — S82141D Displaced bicondylar fracture of right tibia, subsequent encounter for closed fracture with routine healing: Secondary | ICD-10-CM | POA: Diagnosis not present

## 2016-04-19 DIAGNOSIS — H409 Unspecified glaucoma: Secondary | ICD-10-CM | POA: Diagnosis not present

## 2016-04-19 DIAGNOSIS — F341 Dysthymic disorder: Secondary | ICD-10-CM | POA: Diagnosis not present

## 2016-04-20 ENCOUNTER — Telehealth: Payer: Self-pay | Admitting: Family Medicine

## 2016-04-20 ENCOUNTER — Ambulatory Visit (INDEPENDENT_AMBULATORY_CARE_PROVIDER_SITE_OTHER): Payer: Medicare Other | Admitting: Family Medicine

## 2016-04-20 ENCOUNTER — Encounter: Payer: Self-pay | Admitting: Family Medicine

## 2016-04-20 ENCOUNTER — Telehealth: Payer: Self-pay | Admitting: Orthopaedic Surgery

## 2016-04-20 ENCOUNTER — Telehealth: Payer: Self-pay | Admitting: Orthopedic Surgery

## 2016-04-20 VITALS — BP 122/76 | Ht 68.5 in | Wt 213.0 lb

## 2016-04-20 DIAGNOSIS — S82141D Displaced bicondylar fracture of right tibia, subsequent encounter for closed fracture with routine healing: Secondary | ICD-10-CM

## 2016-04-20 DIAGNOSIS — S82101D Unspecified fracture of upper end of right tibia, subsequent encounter for closed fracture with routine healing: Secondary | ICD-10-CM | POA: Diagnosis not present

## 2016-04-20 MED ORDER — OXYCODONE-ACETAMINOPHEN 10-325 MG PO TABS: 1.0000 | ORAL_TABLET | Freq: Three times a day (TID) | ORAL | 0 refills | Status: DC | PRN

## 2016-04-20 MED ORDER — OXYCODONE HCL 5 MG PO TABS: 5.0000 mg | ORAL_TABLET | Freq: Four times a day (QID) | ORAL | 0 refills | Status: DC | PRN

## 2016-04-20 NOTE — Telephone Encounter (Signed)
Received a call from [place] this morning reguarding this patient. He was seen in ER @ [facility] on 03/14/16 for a R Tibial Fracture and was under the impression that we were aware of [facility] referring him to our office. He has been waiting for our office to call him about an appointment, so he goes to his PCP this morning and they call us wanting to see why we have not called him. I explained to the primary care office on the phone that when [facility] ER refers patients to [place],  they advise the patient to call the office to set up an appointment. Today is the first time we have been aware of this patient needing to be seen, no referral was in the W/Q, no phone or fax referral from PCP and the patient never called the office to setup an appointment. I offered to get him an appointment on the next available clinic day when our  Doctors return to the office on Tuesday 04/24/16, but was told they would send him somewhere else.

## 2016-04-20 NOTE — Telephone Encounter (Signed)
Received a call from [place] this morning reguarding this patient. He was seen in ER @ [facility] on 03/14/16 for a R Tibial Fracture and was under the impression that we were aware of [facility] referring him to our office. He has been waiting for our office to call him about an appointment, so he goes to his PCP this morning and they call us wanting to see why we have not called him. I explained to the lady on the phone that [facility] refers a lot of people to [place] and they tell them to call the office to set up an appointment. Today is the first time we have been aware of this patient needing to be seen, no referral was in the W/Q, no phone or fax referral from PCP and the patient never called the office to setup an appointment. I offered to get him in on Tuesday 04/24/16, but was told they would send him somewhere else.

## 2016-04-20 NOTE — Telephone Encounter (Signed)
Wanting to let [name] know that Kerry Solis was not home when she went to visit him today as scheduled.  I told her that we set him up with an urgent referral and we were told he should be able to be home to meet her by 2 today.  She said she was going to try again next week but wanted to know if there was anything that she needed to know regarding his appointment today or the referral?

## 2016-04-20 NOTE — Progress Notes (Signed)
Patient seeing Dr French Ana in [place] today

## 2016-04-20 NOTE — Telephone Encounter (Signed)
Please let Kerry Solis know that I feel the patient is doing better he was seen orthopedics because it was recommended for him to follow-up. Originally when he broke his leg toward the end of September it was recommended to stay out from walking for 6 weeks. It is now been 5 weeks. He saw orthopedics in [place]

## 2016-04-20 NOTE — Progress Notes (Signed)
   Subjective:    Patient ID: Kerry Solis, male    DOB: 1946-03-29, 70 y.o.   MRN: [id number]  HPI Patient arrives for a follow up on leg fracture. Patient states he is doing better every day Patient relates he still having severe pain having to take 1 every 4-8 hours. Denies abusing it. Denies any sweats or chills associated with his illness. States overall he feels he is doing somewhat better.  Review of Systems    no weakness cough shortness breath nausea vomiting diarrhea Objective:   Physical Exam Lungs clear heart regular ankle no swelling leg brace in place       Assessment & Plan:  Leg fracture patient needs follow-up Dr. Aline Brochure we will go ahead and called him the appointment  Pain control patient was given a prescription for the stronger pain medicine use over the next 2 weeks then we need to back that down to a lower amount. He was given 5 mg tablets to use after that follow-up in approximately 4-6 weeks

## 2016-04-23 NOTE — Telephone Encounter (Signed)
Notified Verdis Frederickson that [name] feels the patient is doing better he was seen orthopedics because it was recommended for him to follow-up. Originally when he broke his leg toward the end of September it was recommended to stay out from walking for 6 weeks. It is now been 5 weeks. He saw orthopedics in [place]. Verdis Frederickson verbalized understanding.

## 2016-04-23 NOTE — Telephone Encounter (Signed)
Left message on voicemail to return call.

## 2016-05-01 DIAGNOSIS — G894 Chronic pain syndrome: Secondary | ICD-10-CM | POA: Diagnosis not present

## 2016-05-01 DIAGNOSIS — S82141D Displaced bicondylar fracture of right tibia, subsequent encounter for closed fracture with routine healing: Secondary | ICD-10-CM | POA: Diagnosis not present

## 2016-05-01 DIAGNOSIS — H409 Unspecified glaucoma: Secondary | ICD-10-CM | POA: Diagnosis not present

## 2016-05-01 DIAGNOSIS — F341 Dysthymic disorder: Secondary | ICD-10-CM | POA: Diagnosis not present

## 2016-05-01 DIAGNOSIS — M109 Gout, unspecified: Secondary | ICD-10-CM | POA: Diagnosis not present

## 2016-05-01 DIAGNOSIS — G35 Multiple sclerosis: Secondary | ICD-10-CM | POA: Diagnosis not present

## 2016-05-02 DIAGNOSIS — S82141D Displaced bicondylar fracture of right tibia, subsequent encounter for closed fracture with routine healing: Secondary | ICD-10-CM | POA: Diagnosis not present

## 2016-05-02 DIAGNOSIS — H409 Unspecified glaucoma: Secondary | ICD-10-CM | POA: Diagnosis not present

## 2016-05-02 DIAGNOSIS — M109 Gout, unspecified: Secondary | ICD-10-CM | POA: Diagnosis not present

## 2016-05-02 DIAGNOSIS — F341 Dysthymic disorder: Secondary | ICD-10-CM | POA: Diagnosis not present

## 2016-05-02 DIAGNOSIS — G35 Multiple sclerosis: Secondary | ICD-10-CM | POA: Diagnosis not present

## 2016-05-02 DIAGNOSIS — G894 Chronic pain syndrome: Secondary | ICD-10-CM | POA: Diagnosis not present

## 2016-05-03 DIAGNOSIS — H409 Unspecified glaucoma: Secondary | ICD-10-CM | POA: Diagnosis not present

## 2016-05-03 DIAGNOSIS — G894 Chronic pain syndrome: Secondary | ICD-10-CM | POA: Diagnosis not present

## 2016-05-03 DIAGNOSIS — G35 Multiple sclerosis: Secondary | ICD-10-CM | POA: Diagnosis not present

## 2016-05-03 DIAGNOSIS — M109 Gout, unspecified: Secondary | ICD-10-CM | POA: Diagnosis not present

## 2016-05-03 DIAGNOSIS — S82141D Displaced bicondylar fracture of right tibia, subsequent encounter for closed fracture with routine healing: Secondary | ICD-10-CM | POA: Diagnosis not present

## 2016-05-03 DIAGNOSIS — F341 Dysthymic disorder: Secondary | ICD-10-CM | POA: Diagnosis not present

## 2016-05-04 ENCOUNTER — Ambulatory Visit: Payer: Medicare Other | Admitting: Family Medicine

## 2016-05-07 DIAGNOSIS — S82141D Displaced bicondylar fracture of right tibia, subsequent encounter for closed fracture with routine healing: Secondary | ICD-10-CM | POA: Diagnosis not present

## 2016-05-07 DIAGNOSIS — G35 Multiple sclerosis: Secondary | ICD-10-CM | POA: Diagnosis not present

## 2016-05-07 DIAGNOSIS — M109 Gout, unspecified: Secondary | ICD-10-CM | POA: Diagnosis not present

## 2016-05-07 DIAGNOSIS — H409 Unspecified glaucoma: Secondary | ICD-10-CM | POA: Diagnosis not present

## 2016-05-07 DIAGNOSIS — G894 Chronic pain syndrome: Secondary | ICD-10-CM | POA: Diagnosis not present

## 2016-05-07 DIAGNOSIS — F341 Dysthymic disorder: Secondary | ICD-10-CM | POA: Diagnosis not present

## 2016-05-08 DIAGNOSIS — F341 Dysthymic disorder: Secondary | ICD-10-CM | POA: Diagnosis not present

## 2016-05-08 DIAGNOSIS — M109 Gout, unspecified: Secondary | ICD-10-CM | POA: Diagnosis not present

## 2016-05-08 DIAGNOSIS — G894 Chronic pain syndrome: Secondary | ICD-10-CM | POA: Diagnosis not present

## 2016-05-08 DIAGNOSIS — G35 Multiple sclerosis: Secondary | ICD-10-CM | POA: Diagnosis not present

## 2016-05-08 DIAGNOSIS — S82141D Displaced bicondylar fracture of right tibia, subsequent encounter for closed fracture with routine healing: Secondary | ICD-10-CM | POA: Diagnosis not present

## 2016-05-08 DIAGNOSIS — H409 Unspecified glaucoma: Secondary | ICD-10-CM | POA: Diagnosis not present

## 2016-05-14 DIAGNOSIS — F341 Dysthymic disorder: Secondary | ICD-10-CM | POA: Diagnosis not present

## 2016-05-14 DIAGNOSIS — G894 Chronic pain syndrome: Secondary | ICD-10-CM | POA: Diagnosis not present

## 2016-05-14 DIAGNOSIS — G35 Multiple sclerosis: Secondary | ICD-10-CM | POA: Diagnosis not present

## 2016-05-14 DIAGNOSIS — H409 Unspecified glaucoma: Secondary | ICD-10-CM | POA: Diagnosis not present

## 2016-05-14 DIAGNOSIS — M109 Gout, unspecified: Secondary | ICD-10-CM | POA: Diagnosis not present

## 2016-05-14 DIAGNOSIS — S82141D Displaced bicondylar fracture of right tibia, subsequent encounter for closed fracture with routine healing: Secondary | ICD-10-CM | POA: Diagnosis not present

## 2016-05-16 DIAGNOSIS — M109 Gout, unspecified: Secondary | ICD-10-CM | POA: Diagnosis not present

## 2016-05-16 DIAGNOSIS — S82141D Displaced bicondylar fracture of right tibia, subsequent encounter for closed fracture with routine healing: Secondary | ICD-10-CM | POA: Diagnosis not present

## 2016-05-16 DIAGNOSIS — G894 Chronic pain syndrome: Secondary | ICD-10-CM | POA: Diagnosis not present

## 2016-05-16 DIAGNOSIS — G35 Multiple sclerosis: Secondary | ICD-10-CM | POA: Diagnosis not present

## 2016-05-16 DIAGNOSIS — H409 Unspecified glaucoma: Secondary | ICD-10-CM | POA: Diagnosis not present

## 2016-05-16 DIAGNOSIS — F341 Dysthymic disorder: Secondary | ICD-10-CM | POA: Diagnosis not present

## 2016-05-21 DIAGNOSIS — I10 Essential (primary) hypertension: Secondary | ICD-10-CM | POA: Diagnosis not present

## 2016-05-21 DIAGNOSIS — H43811 Vitreous degeneration, right eye: Secondary | ICD-10-CM | POA: Diagnosis not present

## 2016-05-21 DIAGNOSIS — H2513 Age-related nuclear cataract, bilateral: Secondary | ICD-10-CM | POA: Diagnosis not present

## 2016-05-21 DIAGNOSIS — B20 Human immunodeficiency virus [HIV] disease: Secondary | ICD-10-CM | POA: Diagnosis not present

## 2016-05-21 DIAGNOSIS — H401124 Primary open-angle glaucoma, left eye, indeterminate stage: Secondary | ICD-10-CM | POA: Diagnosis not present

## 2016-05-21 DIAGNOSIS — E785 Hyperlipidemia, unspecified: Secondary | ICD-10-CM | POA: Diagnosis not present

## 2016-05-21 DIAGNOSIS — Z79899 Other long term (current) drug therapy: Secondary | ICD-10-CM | POA: Diagnosis not present

## 2016-05-21 DIAGNOSIS — Z7982 Long term (current) use of aspirin: Secondary | ICD-10-CM | POA: Diagnosis not present

## 2016-05-22 DIAGNOSIS — H524 Presbyopia: Secondary | ICD-10-CM | POA: Diagnosis not present

## 2016-05-22 DIAGNOSIS — H401124 Primary open-angle glaucoma, left eye, indeterminate stage: Secondary | ICD-10-CM | POA: Diagnosis not present

## 2016-05-22 DIAGNOSIS — H2513 Age-related nuclear cataract, bilateral: Secondary | ICD-10-CM | POA: Diagnosis not present

## 2016-05-22 DIAGNOSIS — H5201 Hypermetropia, right eye: Secondary | ICD-10-CM | POA: Diagnosis not present

## 2016-05-22 DIAGNOSIS — H52201 Unspecified astigmatism, right eye: Secondary | ICD-10-CM | POA: Diagnosis not present

## 2016-05-29 ENCOUNTER — Ambulatory Visit: Payer: Medicare Other | Admitting: Family Medicine

## 2016-05-29 ENCOUNTER — Ambulatory Visit (INDEPENDENT_AMBULATORY_CARE_PROVIDER_SITE_OTHER): Payer: Medicare Other | Admitting: Urology

## 2016-05-29 DIAGNOSIS — R972 Elevated prostate specific antigen [PSA]: Secondary | ICD-10-CM | POA: Diagnosis not present

## 2016-05-29 DIAGNOSIS — N402 Nodular prostate without lower urinary tract symptoms: Secondary | ICD-10-CM | POA: Diagnosis not present

## 2016-05-29 DIAGNOSIS — N401 Enlarged prostate with lower urinary tract symptoms: Secondary | ICD-10-CM | POA: Diagnosis not present

## 2016-05-31 ENCOUNTER — Encounter: Payer: Self-pay | Admitting: Family Medicine

## 2016-05-31 ENCOUNTER — Ambulatory Visit (INDEPENDENT_AMBULATORY_CARE_PROVIDER_SITE_OTHER): Payer: Medicare Other | Admitting: Family Medicine

## 2016-05-31 VITALS — BP 128/76 | Ht 68.5 in | Wt 205.0 lb

## 2016-05-31 DIAGNOSIS — S82141D Displaced bicondylar fracture of right tibia, subsequent encounter for closed fracture with routine healing: Secondary | ICD-10-CM

## 2016-05-31 DIAGNOSIS — I10 Essential (primary) hypertension: Secondary | ICD-10-CM | POA: Diagnosis not present

## 2016-05-31 DIAGNOSIS — R740 Nonspecific elevation of levels of transaminase and lactic acid dehydrogenase [LDH]: Secondary | ICD-10-CM | POA: Diagnosis not present

## 2016-05-31 DIAGNOSIS — R7401 Elevation of levels of liver transaminase levels: Secondary | ICD-10-CM

## 2016-05-31 MED ORDER — AMLODIPINE BESYLATE 10 MG PO TABS: ORAL_TABLET | ORAL | 6 refills | Status: DC

## 2016-05-31 MED ORDER — OXYCODONE HCL 5 MG PO TABS: 5.0000 mg | ORAL_TABLET | Freq: Four times a day (QID) | ORAL | 0 refills | Status: DC | PRN

## 2016-05-31 MED ORDER — SERTRALINE HCL 100 MG PO TABS: 100.0000 mg | ORAL_TABLET | Freq: Every day | ORAL | 5 refills | Status: DC

## 2016-05-31 MED ORDER — PRAVASTATIN SODIUM 40 MG PO TABS: ORAL_TABLET | ORAL | 5 refills | Status: DC

## 2016-05-31 MED ORDER — LISINOPRIL-HYDROCHLOROTHIAZIDE 10-12.5 MG PO TABS: ORAL_TABLET | ORAL | 5 refills | Status: DC

## 2016-05-31 NOTE — Progress Notes (Signed)
   Subjective:    Patient ID: Kerry Solis, male    DOB: July 03, 1945, 70 y.o.   MRN: [id number]  HPI Patient is here today for a follow up visit on his leg fracture. Patient states that his leg is feeling a lot better.He is not having to use oxycodone as much he denies any problems with medication he states he seen the orthopedist a couple times. He has not started back on his cholesterol medicine just yet he states he does not think it causes any body aches when things body aches is probably more related to his chronic diseases he denies high fever chills sweats denies chest pain Patient complains of body aches lately.  Patient has no other concerns at this time.    Review of Systems  Constitutional: Negative for activity change, fatigue and fever.  Respiratory: Negative for cough and shortness of breath.   Cardiovascular: Negative for chest pain and leg swelling.  Neurological: Negative for headaches.       Objective:   Physical Exam  Constitutional: He appears well-nourished. No distress.  Cardiovascular: Normal rate, regular rhythm and normal heart sounds.   No murmur heard. Pulmonary/Chest: Effort normal and breath sounds normal. No respiratory distress.  Musculoskeletal: He exhibits no edema.  Lymphadenopathy:    He has no cervical adenopathy.  Neurological: He is alert.  Psychiatric: His behavior is normal.  Vitals reviewed.         Assessment & Plan:  I find no evidence of any type of severe infectious process going on  He is getting better with the pain in his leg from the fracture reduced the use of oxycodone over the next 6 weeks refill given  Blood pressure good control continue current measures  Restart cholesterol medicine check metabolic 7 and liver profile  Follow-up in 4-5 months

## 2016-06-01 ENCOUNTER — Other Ambulatory Visit: Payer: Self-pay | Admitting: Urology

## 2016-06-01 ENCOUNTER — Encounter: Payer: Self-pay | Admitting: Family Medicine

## 2016-06-01 DIAGNOSIS — R972 Elevated prostate specific antigen [PSA]: Secondary | ICD-10-CM

## 2016-06-01 LAB — HEPATIC FUNCTION PANEL
ALT: 44 IU/L (ref 0–44)
AST: 40 IU/L (ref 0–40)
Albumin: 4.8 g/dL (ref 3.5–4.8)
Alkaline Phosphatase: 76 IU/L (ref 39–117)
Bilirubin Total: 0.3 mg/dL (ref 0.0–1.2)
Bilirubin, Direct: 0.1 mg/dL (ref 0.00–0.40)
Total Protein: 7.8 g/dL (ref 6.0–8.5)

## 2016-06-01 LAB — BASIC METABOLIC PANEL
BUN/Creatinine Ratio: 15 (ref 10–24)
BUN: 15 mg/dL (ref 8–27)
CO2: 22 mmol/L (ref 18–29)
Calcium: 9.8 mg/dL (ref 8.6–10.2)
Chloride: 105 mmol/L (ref 96–106)
Creatinine, Ser: 1.01 mg/dL (ref 0.76–1.27)
GFR calc Af Amer: 86 mL/min/{1.73_m2} (ref >59)
GFR calc non Af Amer: 74 mL/min/{1.73_m2} (ref >59)
Glucose: 108 mg/dL — ABNORMAL HIGH (ref 65–99)
Potassium: 5.2 mmol/L (ref 3.5–5.2)
Sodium: 145 mmol/L — ABNORMAL HIGH (ref 134–144)

## 2016-06-08 DIAGNOSIS — S82101D Unspecified fracture of upper end of right tibia, subsequent encounter for closed fracture with routine healing: Secondary | ICD-10-CM | POA: Diagnosis not present

## 2016-06-19 ENCOUNTER — Ambulatory Visit (HOSPITAL_COMMUNITY): Admission: RE | Admit: 2016-06-19 | Payer: Medicare Other | Source: Ambulatory Visit

## 2016-06-19 ENCOUNTER — Ambulatory Visit (HOSPITAL_COMMUNITY)
Admission: RE | Admit: 2016-06-19 | Discharge: 2016-06-19 | Disposition: A | Discharge status: Home / Self Care | Payer: Medicare Other | Source: Ambulatory Visit | Attending: Urology | Admitting: Urology

## 2016-06-19 DIAGNOSIS — R972 Elevated prostate specific antigen [PSA]: Secondary | ICD-10-CM | POA: Insufficient documentation

## 2016-06-19 DIAGNOSIS — C61 Malignant neoplasm of prostate: Secondary | ICD-10-CM | POA: Diagnosis not present

## 2016-06-19 MED ORDER — GENTAMICIN SULFATE 40 MG/ML IJ SOLN
160.0000 mg | Freq: Once | INTRAMUSCULAR | Status: AC
  Administered 2016-06-19: 160 mg via INTRAMUSCULAR

## 2016-06-19 MED ORDER — LIDOCAINE HCL (PF) 2 % IJ SOLN
10.0000 mL | Freq: Once | INTRAMUSCULAR | Status: AC
  Administered 2016-06-19: 10 mL

## 2016-06-19 MED ORDER — LIDOCAINE HCL (PF) 2 % IJ SOLN
INTRAMUSCULAR | Status: AC
  Administered 2016-06-19: 10 mL
  Filled 2016-06-19: qty 10

## 2016-06-19 MED ORDER — GENTAMICIN SULFATE 40 MG/ML IJ SOLN
INTRAMUSCULAR | Status: AC
  Administered 2016-06-19: 160 mg via INTRAMUSCULAR
  Filled 2016-06-19: qty 4

## 2016-06-19 NOTE — Discharge Instructions (Signed)
Transrectal Ultrasound-Guided Biopsy °A transrectal ultrasound-guided biopsy is a procedure to take samples of tissue from your prostate. Ultrasound images are used to guide the procedure. It is usually done to check the prostate gland for cancer. °What happens before the procedure? °· Do not eat or drink after midnight on the night before your procedure. °· Take medicines as your doctor tells you. °· Your doctor may have you stop taking some medicines 5-7 days before the procedure. °· You will be given an enema before your procedure. During an enema, a liquid is put into your butt (rectum) to clear out waste. °· You may have lab tests the day of your procedure. °· Make plans to have someone drive you home. °What happens during the procedure? °· You will be given medicine to help you relax before the procedure. An IV tube will be put into one of your veins. It will be used to give fluids and medicine. °· You will be given medicine to reduce the risk of infection (antibiotic). °· You will be placed on your side. °· A probe with gel will be put in your butt. This is used to take pictures of your prostate and the area around it. °· A medicine to numb the area is put into your prostate. °· A biopsy needle is then inserted and guided to your prostate. °· Samples of prostate tissue are taken. The needle is removed. °· The samples are sent to a lab to be checked. Results are usually back in 2-3 days. °What happens after the procedure? °· You will be taken to a room where you will be watched until you are doing okay. °· You may have some pain in the area around your butt. You will be given medicines for this. °· You may be able to go home the same day. Sometimes, an overnight stay in the hospital is needed. °This information is not intended to replace advice given to you by your health care provider. Make sure you discuss any questions you have with your health care provider. °Document Released: 05/23/2009 Document Revised:  11/10/2015 Document Reviewed: 01/21/2013 °Elsevier Interactive Patient Education © 2017 [employer] ° °

## 2016-06-26 ENCOUNTER — Telehealth: Payer: Self-pay | Admitting: Family Medicine

## 2016-06-26 NOTE — Telephone Encounter (Signed)
Review prostate pathology report from [facility] in results folder.

## 2016-06-27 ENCOUNTER — Encounter: Payer: Self-pay | Admitting: Family Medicine

## 2016-06-27 DIAGNOSIS — C61 Malignant neoplasm of prostate: Secondary | ICD-10-CM | POA: Insufficient documentation

## 2016-06-28 NOTE — Telephone Encounter (Signed)
Noted. Being managed for prostate a cancer by specialist

## 2016-07-17 ENCOUNTER — Ambulatory Visit (INDEPENDENT_AMBULATORY_CARE_PROVIDER_SITE_OTHER): Payer: Medicare Other | Admitting: Urology

## 2016-07-17 DIAGNOSIS — C61 Malignant neoplasm of prostate: Secondary | ICD-10-CM

## 2016-07-19 ENCOUNTER — Encounter: Payer: Self-pay | Admitting: Radiation Oncology

## 2016-07-24 ENCOUNTER — Ambulatory Visit (INDEPENDENT_AMBULATORY_CARE_PROVIDER_SITE_OTHER): Payer: Medicare Other | Admitting: Family Medicine

## 2016-07-24 ENCOUNTER — Encounter: Payer: Self-pay | Admitting: Family Medicine

## 2016-07-24 VITALS — BP 140/82 | Temp 97.9°F | Ht 68.5 in | Wt 216.2 lb

## 2016-07-24 DIAGNOSIS — B9689 Other specified bacterial agents as the cause of diseases classified elsewhere: Secondary | ICD-10-CM | POA: Diagnosis not present

## 2016-07-24 DIAGNOSIS — J019 Acute sinusitis, unspecified: Secondary | ICD-10-CM | POA: Diagnosis not present

## 2016-07-24 DIAGNOSIS — R1310 Dysphagia, unspecified: Secondary | ICD-10-CM

## 2016-07-24 DIAGNOSIS — R1319 Other dysphagia: Secondary | ICD-10-CM

## 2016-07-24 MED ORDER — AMOXICILLIN 500 MG PO TABS: 500.0000 mg | ORAL_TABLET | Freq: Three times a day (TID) | ORAL | 0 refills | Status: DC

## 2016-07-24 NOTE — Progress Notes (Signed)
   Subjective:    Patient ID: Kerry Solis, male    DOB: 1946/02/11, 71 y.o.   MRN: [id number]  Sinusitis  This is a new problem. The current episode started 1 to 4 weeks ago. The problem is unchanged. There has been no fever. The pain is moderate. Associated symptoms include congestion and coughing. Pertinent negatives include no chills or ear pain. Treatments tried: rock and rye  The treatment provided mild relief.  Patient relates head congestion drainage sinus pressure pain denies high fever chills Patient states that he has no other concerns at this time. Patient does relate some mild dysphagia when he eats especially solids like meats Review of Systems  Constitutional: Negative for activity change, chills, fatigue and fever.  HENT: Positive for congestion and rhinorrhea. Negative for ear pain.   Eyes: Negative for discharge.  Respiratory: Positive for cough. Negative for choking and wheezing.   Cardiovascular: Negative for chest pain.       Objective:   Physical Exam  Constitutional: He appears well-developed.  HENT:  Head: Normocephalic.  Mouth/Throat: Oropharynx is clear and moist. No oropharyngeal exudate.  Neck: Normal range of motion.  Cardiovascular: Normal rate, regular rhythm and normal heart sounds.   No murmur heard. Pulmonary/Chest: Effort normal and breath sounds normal. He has no wheezes.  Lymphadenopathy:    He has no cervical adenopathy.  Neurological: He exhibits normal muscle tone.  Skin: Skin is warm and dry.  Nursing note and vitals reviewed.         Assessment & Plan:  Acute rhinosinusitis viral syndrome secondary rhinosinusitis Dysphagia urgent referral to gastroenterology will need EGD-patient counseled to stick with soft diet for now Patient has upcoming prostate cancer treatment possibly radioactive seeds versus surgery he meets with specialists in.

## 2016-07-25 NOTE — Progress Notes (Signed)
GU Location of Tumor / Histology: prostatic adenocarcinoma  Gleason Score is (3 + 4=7). Prostate volume 27 cc. ()  Kerry Solis was referred to Dr. Diona Fanti by Dr. Domenic Moras for evaluation of an elevated PSA.   Biopsies of prostate (if applicable) revealed:     Past/Anticipated interventions by urology, if any: Atypical nodule biopsy years ago, benign. Prostate biopsy 06/19/16. Referral to Dr. Tammi Klippel to discuss radiation therapy.  Past/Anticipated interventions by medical oncology, if any: no  Weight changes, if any: no  Bowel/Bladder complaints, if any: IPSS 17 with weak stream and nocturia x 3. Denies dysuria, hematuria or leakage. Reports urinary urgency.  Nausea/Vomiting, if any: no  Pain issues, if any:  no  SAFETY ISSUES:  Prior radiation? no  Pacemaker/ICD? no  Possible current pregnancy? no  Is the patient on methotrexate? no  Current Complaints / other details: 71 year old. African American male.  Divorced.  HIV (2014), Gout (2014), Cholelithiasis, hyperlipedemia, hypertension.  Kidney stones, Renal cysts, and Edidymitis.

## 2016-07-26 ENCOUNTER — Encounter: Payer: Self-pay | Admitting: Gastroenterology

## 2016-07-26 ENCOUNTER — Ambulatory Visit (INDEPENDENT_AMBULATORY_CARE_PROVIDER_SITE_OTHER): Payer: Medicare Other | Admitting: Gastroenterology

## 2016-07-26 ENCOUNTER — Other Ambulatory Visit: Payer: Self-pay

## 2016-07-26 ENCOUNTER — Telehealth: Payer: Self-pay

## 2016-07-26 DIAGNOSIS — R1310 Dysphagia, unspecified: Secondary | ICD-10-CM

## 2016-07-26 DIAGNOSIS — R1319 Other dysphagia: Secondary | ICD-10-CM

## 2016-07-26 MED ORDER — PANTOPRAZOLE SODIUM 40 MG PO TBEC: 40.0000 mg | DELAYED_RELEASE_TABLET | Freq: Every day | ORAL | 3 refills | Status: DC

## 2016-07-26 NOTE — Progress Notes (Signed)
Referring Provider: Kathyrn Drown, MD Primary Care Physician:  Sallee Lange, MD Primary GI: Dr. Oneida Alar   Chief Complaint  Patient presents with  . Dysphagia    HPI:   Kerry Solis is a 71 y.o. male presenting today with history of adenomas, due for surveillance in 2021. Last EGD in 2010 with Schatzki's ring s/p dilation.   Dysphagia for 2-3 weeks. No odynophagia. Solid food dysphagia. Stomach feels a little sore. Hasn't been on a PPI for a good while. Has intermittent reflux. No nausea but not severe. Sometimes a mouth full of fluid comes up after eating. No unexplained weight loss. Small amount of constipation. Not taking anything for it because it is not often.   History of fatty liver with most recent LFTs normal. Recently diagnosed with prostate cancer and sees urology tomorrow, 2/9.   Past Medical History:  Diagnosis Date  . Anxiety   . Arthritis   . Cryptococcal meningitis ([place])   . Depression   . Elevated liver enzymes   . Glaucoma   . High triglycerides   . History of gout   . HIV (human immunodeficiency virus infection) ([place])   . Hypertension   . Leukopenia    Low CD4  . Multiple sclerosis ([place])   . Myocardial infarction    45 years ago  . Prostate cancer [facility])     Past Surgical History:  Procedure Laterality Date  . CHOLECYSTECTOMY  06/10/2011   Procedure: LAPAROSCOPIC CHOLECYSTECTOMY;  Surgeon: Donato Heinz, MD;  Location: [facility];  Service: General;  Laterality: N/A;  . COLONOSCOPY  03/10/2009   [id number] internal hemorrhoids/6-mm sessile ascending colon polyp/tortuous colon, diverticulosis. TA  . COLONOSCOPY WITH PROPOFOL N/A 04/19/2015   Dr. Oneida Alar: sessile serrated adenoma, surveillance in 5 years   . ESOPHAGOGASTRODUODENOSCOPY  06/23/2008   [id number] gastritis/schatzki ring  . NECK SURGERY     disc  . POLYPECTOMY N/A 04/19/2015   Procedure: POLYPECTOMY;  Surgeon: Danie Binder, MD;  Location: [facility];  Service: Endoscopy;  Laterality:  N/A;  ascending colon    Current Outpatient Prescriptions  Medication Sig Dispense Refill  . acetaminophen (TYLENOL) 500 MG tablet Take 1,000 mg by mouth 3 (three) times daily as needed for mild pain or moderate pain.    Marland Kitchen allopurinol (ZYLOPRIM) 100 MG tablet TAKE (1) TABLET BY MOUTH ONCE DAILY FOR GOUT. 30 tablet 5  . amitriptyline (ELAVIL) 25 MG tablet TAKE TWO TABLETS BY MOUTH DAILY AT BEDTIME  9  . amLODipine (NORVASC) 10 MG tablet TAKE (1) TABLET BY MOUTH DAILY FOR HIGH BLOOD PRESSURE. 30 tablet 6  . amoxicillin (AMOXIL) 500 MG tablet Take 1 tablet (500 mg total) by mouth 3 (three) times daily. 30 tablet 0  . aspirin EC 81 MG tablet Take 81 mg by mouth daily.    . Cholecalciferol (VITAMIN D PO) Take 1 tablet by mouth daily.    . DESCOVY 200-25 MG tablet TAKE ONE TABLET BY MOUTH ONCE DAILY  11  . folic acid (FOLVITE) 1 MG tablet Take 1 mg by mouth daily.    . interferon beta-1a (REBIF) 44 MCG/0.5ML SOSY injection Inject 44 mcg into the skin 3 (three) times a week.    . latanoprost (XALATAN) 0.005 % ophthalmic solution Place 1 drop into the left eye at bedtime.     Marland Kitchen lisinopril-hydrochlorothiazide (PRINZIDE,ZESTORETIC) 10-12.5 MG tablet TAKE (1) TABLET BY MOUTH DAILY FOR HIGH BLOOD PRESSURE. 30 tablet 5  . Multiple Vitamin (MULTIVITAMIN WITH MINERALS)  TABS tablet Take 1 tablet by mouth daily.    Marland Kitchen oxyCODONE (ROXICODONE) 5 MG immediate release tablet Take 1 tablet (5 mg total) by mouth every 6 (six) hours as needed for severe pain. 40 tablet 0  . oxyCODONE-acetaminophen (PERCOCET) 10-325 MG tablet Take 1 tablet by mouth every 8 (eight) hours as needed for pain. 40 tablet 0  . pravastatin (PRAVACHOL) 40 MG tablet TAKE 1 TABLET BY MOUTH AT BEDTIME FOR CHOLESTEROL 30 tablet 5  . Pyridoxine HCl (VITAMIN B-6 PO) Take 1 tablet by mouth daily.    . sertraline (ZOLOFT) 100 MG tablet Take 1 tablet (100 mg total) by mouth daily. 30 tablet 5  . pantoprazole (PROTONIX) 40 MG tablet Take 1 tablet (40 mg  total) by mouth daily. 90 tablet 3   No current facility-administered medications for this visit.     Allergies as of 07/26/2016 - Review Complete 07/26/2016  Allergen Reaction Noted  . Yellow jacket venom Swelling 03/14/2016    Family History  Problem Relation Age of Onset  . Hypertension Mother   . Heart attack Mother   . Heart attack Father   . Cancer Sister     Breast  . Diabetes Sister   . Cancer Brother     Colon  . Diabetes Brother   . Prostate cancer Brother   . Cancer Brother     colon  . Prostate cancer Brother     Social History   Social History  . Marital status: Divorced    Spouse name: N/A  . Number of children: N/A  . Years of education: N/A   Social History Main Topics  . Smoking status: Never Smoker  . Smokeless tobacco: Never Used  . Alcohol use No  . Drug use: No  . Sexual activity: No   Other Topics Concern  . None   Social History Narrative  . None    Review of Systems: As mentioned in HPI.   Physical Exam: BP (!) 146/83   Pulse 96   Temp 98 F (36.7 C) (Oral)   Ht 5\' 8"  (1.727 m)   Wt 216 lb (98 kg)   BMI 32.84 kg/m  General:   Alert and oriented. No distress noted. Pleasant and cooperative.  Head:  Normocephalic and atraumatic. Eyes:  Conjuctiva clear without scleral icterus. Mouth:  Oral mucosa pink and moist. Good dentition. No lesions Heart:  S1, S2 present without murmurs, rubs, or gallops. Regular rate and rhythm. Abdomen:  +BS, soft, non-tender and non-distended. No rebound or guarding. No HSM or masses noted. Msk:  Symmetrical without gross deformities. Normal posture Extremities:  Without edema. Neurologic:  Alert and  oriented x4;  grossly normal neurologically. Skin:  Intact without significant lesions or rashes. Psych:  Alert and cooperative. Normal mood and affect.  Lab Results  Component Value Date   ALT 44 05/31/2016   AST 40 05/31/2016   ALKPHOS 76 05/31/2016   BILITOT 0.3 05/31/2016

## 2016-07-26 NOTE — Telephone Encounter (Signed)
LMOAM and informed pt of pre-op appt 08/08/16 at 1:45pm. Letter mailed.

## 2016-07-26 NOTE — Patient Instructions (Signed)
We have scheduled you for an upper endoscopy with dilation with Dr. Oneida Alar in the near future.  Start taking Protonix once each morning, 30 minutes before breakfast. I sent this to your pharmacy.  I am sorry to hear you got bad news, and we will be thinking of you.

## 2016-07-27 NOTE — Progress Notes (Signed)
Radiation Oncology         [phone] ________________________________  Initial Outpatient Consultation  Name: Kerry Solis MRN: [id number]  Date: 07/30/2016  DOB: 1946/05/16  [id number] [name], MD  Franchot Gallo, MD   REFERRING PHYSICIAN: Franchot Gallo, MD  DIAGNOSIS: 71 year-old gentleman with Stage T2c N0 M0 adenocarcinoma of the prostate with Gleason Score of 3+4, and PSA of 4.1.    ICD-9-CM ICD-10-CM   1. Malignant neoplasm of prostate ([place]) 185 C61   2. Prostate cancer ([place]) [place] [facility] referral to Social Work    HISTORY OF PRESENT ILLNESS: Kerry Solis is a 71 y.o. male with a diagnosis of prostate cancer. He was diagnosed with HIV about 25 years ago, he has stable CD4 counts and an undetectable viral load. He had a previous nodularity and had prostate biopsy in 2010 which was negative. He was noted to have an elevated PSA of 4.1 in January 2018 by his primary care physician, Dr. [name].  Accordingly, he was referred for evaluation in urology by Dr. Diona Fanti on 05/29/2016,  digital rectal examination was performed at that time revealing left apex 1 cm prostate nodule.  The patient proceeded to transrectal ultrasound with 12 biopsies of the prostate on 06/19/2016.  The prostate volume measured 27 cc.  Out of 12 core biopsies, 4 were positive.  The maximum Gleason score was 3+4, and this was seen in the left mid and right mid.  The patient reviewed the biopsy results with his urologist and he has kindly been referred today for discussion of potential radiation treatment options.      PREVIOUS RADIATION THERAPY: No  PAST MEDICAL HISTORY:  Past Medical History:  Diagnosis Date  . Anxiety   . Arthritis   . Cryptococcal meningitis ([place])   . Depression   . Elevated liver enzymes   . Glaucoma   . High triglycerides   . History of gout   . HIV (human immunodeficiency virus infection) ([place])   . Hypertension   . Leukopenia    Low CD4  . Multiple  sclerosis ([place])   . Myocardial infarction    45 years ago  . Prostate cancer ([place])       PAST SURGICAL HISTORY: Past Surgical History:  Procedure Laterality Date  . CHOLECYSTECTOMY  06/10/2011   Procedure: LAPAROSCOPIC CHOLECYSTECTOMY;  Surgeon: Donato Heinz, MD;  Location: [facility];  Service: General;  Laterality: N/A;  . COLONOSCOPY  03/10/2009   [id number] internal hemorrhoids/6-mm sessile ascending colon polyp/tortuous colon, diverticulosis. TA  . COLONOSCOPY WITH PROPOFOL N/A 04/19/2015   Dr. Oneida Alar: sessile serrated adenoma, surveillance in 5 years   . ESOPHAGOGASTRODUODENOSCOPY  06/23/2008   [id number] gastritis/schatzki ring  . NECK SURGERY     disc  . POLYPECTOMY N/A 04/19/2015   Procedure: POLYPECTOMY;  Surgeon: Danie Binder, MD;  Location: [facility];  Service: Endoscopy;  Laterality: N/A;  ascending colon  . PROSTATE BIOPSY      FAMILY HISTORY:  Family History  Problem Relation Age of Onset  . Hypertension Mother   . Heart attack Mother   . Heart attack Father   . Cancer Sister     Breast  . Diabetes Sister   . Diabetes Brother   . Cancer Brother     unknown  . Cancer Brother     colon  . Cancer Sister     breast  . Cancer Brother     prostate  . Cancer Brother  colon    SOCIAL HISTORY:  Social History   Social History  . Marital status: Divorced    Spouse name: N/A  . Number of children: N/A  . Years of education: N/A   Occupational History  . Not on file.   Social History Main Topics  . Smoking status: Never Smoker  . Smokeless tobacco: Never Used  . Alcohol use No  . Drug use: No  . Sexual activity: No   Other Topics Concern  . Not on file   Social History Narrative  . No narrative on file  He lives in [place]. He is divorced and accompanied by his son.  ALLERGIES: Yellow jacket venom  MEDICATIONS:  Current Outpatient Prescriptions  Medication Sig Dispense Refill  . acetaminophen (TYLENOL) 500 MG tablet Take 1,000 mg  by mouth 3 (three) times daily as needed for mild pain or moderate pain.    Marland Kitchen allopurinol (ZYLOPRIM) 100 MG tablet TAKE (1) TABLET BY MOUTH ONCE DAILY FOR GOUT. 30 tablet 5  . amitriptyline (ELAVIL) 25 MG tablet TAKE TWO TABLETS BY MOUTH DAILY AT BEDTIME  9  . amLODipine (NORVASC) 10 MG tablet TAKE (1) TABLET BY MOUTH DAILY FOR HIGH BLOOD PRESSURE. 30 tablet 6  . amoxicillin (AMOXIL) 500 MG tablet Take 1 tablet (500 mg total) by mouth 3 (three) times daily. 30 tablet 0  . aspirin EC 81 MG tablet Take 81 mg by mouth daily.    . carbamazepine (TEGRETOL) 100 MG chewable tablet Chew by mouth 2 (two) times daily.    . chlorzoxazone (PARAFON) 500 MG tablet Take by mouth 4 (four) times daily as needed for muscle spasms.    . Cholecalciferol (VITAMIN D PO) Take 1 tablet by mouth daily.    . citalopram (CELEXA) 20 MG tablet Take 20 mg by mouth daily.    . DESCOVY 200-25 MG tablet TAKE ONE TABLET BY MOUTH ONCE DAILY  11  . folic acid (FOLVITE) 1 MG tablet Take 1 mg by mouth daily.    . interferon beta-1a (REBIF) 44 MCG/0.5ML SOSY injection Inject 44 mcg into the skin 3 (three) times a week.    . latanoprost (XALATAN) 0.005 % ophthalmic solution Place 1 drop into the left eye at bedtime.     Marland Kitchen lisinopril-hydrochlorothiazide (PRINZIDE,ZESTORETIC) 10-12.5 MG tablet TAKE (1) TABLET BY MOUTH DAILY FOR HIGH BLOOD PRESSURE. 30 tablet 5  . Multiple Vitamin (MULTIVITAMIN WITH MINERALS) TABS tablet Take 1 tablet by mouth daily.    Marland Kitchen oxyCODONE (ROXICODONE) 5 MG immediate release tablet Take 1 tablet (5 mg total) by mouth every 6 (six) hours as needed for severe pain. 40 tablet 0  . oxyCODONE-acetaminophen (PERCOCET) 10-325 MG tablet Take 1 tablet by mouth every 8 (eight) hours as needed for pain. 40 tablet 0  . pantoprazole (PROTONIX) 40 MG tablet Take 1 tablet (40 mg total) by mouth daily. 90 tablet 3  . pravastatin (PRAVACHOL) 40 MG tablet TAKE 1 TABLET BY MOUTH AT BEDTIME FOR CHOLESTEROL 30 tablet 5  . Pyridoxine  HCl (VITAMIN B-6 PO) Take 1 tablet by mouth daily.    . sertraline (ZOLOFT) 100 MG tablet Take 1 tablet (100 mg total) by mouth daily. 30 tablet 5  . TIVICAY 50 MG tablet TK 1 T PO QD. STOP ATRIPLA  5   No current facility-administered medications for this encounter.     REVIEW OF SYSTEMS:  On review of systems, the patient reports that he is doing well overall. He denies any chest pain,  shortness of breath, cough, fevers, chills, night sweats, unintended weight changes. He denies nausea or vomiting. He reports occasional constipation and intermittent abdominal soreness. He denies any new musculoskeletal or joint aches or pains. His IPSS was 17, indicating moderate urinary symptoms. He reports weak stream, urinary urgency, and nocturia x 3. He denies dysuria, hematuria, or leakage. He is able to complete sexual activity with most attempts. A complete review of systems is obtained and is otherwise negative.     PHYSICAL EXAM:  Wt Readings from Last 3 Encounters:  07/30/16 218 lb 9.6 oz (99.2 kg)  07/26/16 216 lb (98 kg)  07/24/16 216 lb 4 oz (98.1 kg)   Temp Readings from Last 3 Encounters:  07/30/16 98.4 F (36.9 C) (Oral)  07/26/16 98 F (36.7 C) (Oral)  07/24/16 97.9 F (36.6 C) (Oral)   BP Readings from Last 3 Encounters:  07/30/16 (!) 153/72  07/26/16 (!) 146/83  07/24/16 140/82   Pulse Readings from Last 3 Encounters:  07/30/16 96  07/26/16 96  06/19/16 86   Pain Assessment Pain Score: 0-No pain/10  In general this is a well appearing African-American male in no acute distress. He uses a cane to ambulate. He is alert and oriented x4 and appropriate throughout the examination. HEENT reveals that the patient is normocephalic, atraumatic. EOMs are intact. PERRLA. Skin is intact without any evidence of gross lesions. Cardiovascular exam reveals a regular rate and rhythm, no clicks rubs or murmurs are auscultated. Chest is clear to auscultation bilaterally. Lymphatic assessment  is performed and does reveal a swollen lymph node 1 cm mobile in the right cervical but did not reveal any other adenopathy in the cervical, supraclavicular, axillary, or inguinal chains. Abdomen has active bowel sounds in all quadrants and is intact. The abdomen is soft, non tender, non distended. Lower extremities are negative for pretibial pitting edema, deep calf tenderness, cyanosis or clubbing.    KPS =100      100 - Normal; no complaints; no evidence of disease. 90   - Able to carry on normal activity; minor signs or symptoms of disease. 80   - Normal activity with effort; some signs or symptoms of disease. 101   - Cares for self; unable to carry on normal activity or to do active work. 60   - Requires occasional assistance, but is able to care for most of his personal needs. 50   - Requires considerable assistance and frequent medical care. 59   - Disabled; requires special care and assistance. 78   - Severely disabled; hospital admission is indicated although death not imminent. 21   - Very sick; hospital admission necessary; active supportive treatment necessary. 10   - Moribund; fatal processes progressing rapidly. 0     - Dead  Karnofsky DA, Abelmann [place], Craver LS and Burchenal [facility] [phone]) The use of the nitrogen mustards in the palliative treatment of carcinoma: with particular reference to bronchogenic carcinoma Cancer 1 634-56  LABORATORY DATA:  Lab Results  Component Value Date   WBC 4.8 03/15/2016   HGB 12.5 (L) 03/15/2016   HCT 38.9 (L) 03/15/2016   MCV 88.6 03/15/2016   PLT 191 03/15/2016   Lab Results  Component Value Date   NA 145 (H) 05/31/2016   K 5.2 05/31/2016   CL 105 05/31/2016   CO2 22 05/31/2016   Lab Results  Component Value Date   ALT 44 05/31/2016   AST 40 05/31/2016   ALKPHOS 76 05/31/2016   BILITOT  0.3 05/31/2016     RADIOGRAPHY: No results found.    IMPRESSION/PLAN: 1. 71 y.o. gentleman with intermediate risk, Stage T2c adenocarcinoma of  the prostate with Gleason Score of 3+4, and PSA of 4.1.   He falls into a select sub-set of patients with intermediate risk disease who are eligible for seed implant with primary Gleason grade of 3 and less than half of one lobe positive for Gleason's 7 disease.  We discussed the natural history of prostate cancer.  We reviewed the the implications of T-stage, Gleason's Score, and PSA on decision-making and outcomes in prostate cancer.  We discussed radiation treatment in the management of prostate cancer with regard to the logistics and delivery of external beam radiation treatment as well as the logistics and delivery of prostate brachytherapy.  We compared and contrasted each of these approaches and also compared these against prostatectomy.  The patient is leaning toward seed implant, but is still considering his options. We will touch base with him in about two weeks, and share this with Dr. Diona Fanti If he chooses external radiotherapy, he would be nterested in a referral to receive radiation in [place] to lessen his commute. 2. Possible predisposition to malignancy. Strong family history of prostate, breast, and colon cancer. We will refer him to genetic counseling. I discussed a referral to genetic counseling for evaluation and he is interested. We will coordinate this referral with him.  The above documentation reflects our direct findings during this shared patient visit.    Carola Rhine, PAC &   [name], MD [place] Director and Director of Stereotactic Radiosurgery Direct Dial: [phone]  Fax: [phone] [email or web address]  Skype  [email or web address]   This document serves as a record of services personally performed by [name], MD and Shona Winski, PA-C. It was created on their behalf by Arlyce Harman, a trained medical scribe. The creation of this record is based on the scribe's personal observations and the provider's statements to  them. This document has been checked and approved by the attending provider.

## 2016-07-29 ENCOUNTER — Encounter: Payer: Self-pay | Admitting: Gastroenterology

## 2016-07-29 NOTE — Assessment & Plan Note (Signed)
71 year old with new onset dysphagia, history of Schatzki's ring in 2010. No PPI recently in setting of chronic GERD. Will start Protonix once daily and arrange EGD for further evaluation.   Proceed with upper endoscopy and dilation in the near future with Dr. Oneida Alar. The risks, benefits, and alternatives have been discussed in detail with patient. They have stated understanding and desire to proceed.  PROPOFOL due to polypharmacy  Protonix sent to pharmacy

## 2016-07-30 ENCOUNTER — Ambulatory Visit
Admission: RE | Admit: 2016-07-30 | Discharge: 2016-07-30 | Disposition: A | Discharge status: Home / Self Care | Payer: Medicare Other | Source: Ambulatory Visit | Attending: Radiation Oncology | Admitting: Radiation Oncology

## 2016-07-30 ENCOUNTER — Encounter: Payer: Self-pay | Admitting: Radiation Oncology

## 2016-07-30 ENCOUNTER — Encounter: Payer: Self-pay | Admitting: Medical Oncology

## 2016-07-30 VITALS — BP 153/72 | HR 96 | Temp 98.4°F | Resp 18 | Ht 68.0 in | Wt 218.6 lb

## 2016-07-30 DIAGNOSIS — Z1503 Genetic susceptibility to malignant neoplasm of prostate: Secondary | ICD-10-CM | POA: Diagnosis not present

## 2016-07-30 DIAGNOSIS — Z7982 Long term (current) use of aspirin: Secondary | ICD-10-CM | POA: Insufficient documentation

## 2016-07-30 DIAGNOSIS — Z510 Encounter for antineoplastic radiation therapy: Secondary | ICD-10-CM | POA: Insufficient documentation

## 2016-07-30 DIAGNOSIS — Z8042 Family history of malignant neoplasm of prostate: Secondary | ICD-10-CM | POA: Diagnosis not present

## 2016-07-30 DIAGNOSIS — C61 Malignant neoplasm of prostate: Secondary | ICD-10-CM

## 2016-07-30 NOTE — Progress Notes (Signed)
cc'ed to pcp °

## 2016-07-30 NOTE — Progress Notes (Signed)
See progress note under physician encounter. 

## 2016-08-07 NOTE — Patient Instructions (Signed)
73    Your procedure is scheduled on: 08/14/2016  Report to [facility] at 6:15    AM.  Call this number if you have problems the morning of surgery: [phone]   Remember:   Do not drink or eat food:After Midnight.     Take these medicines the morning of surgery with A SIP OF WATER:Tivicay, Elavil, Amlodipine, Tegretol, Celexa and lisinopril  Do not wear jewelry, make-up or nail polish.  Do not wear lotions, powders, or perfumes. You may wear deodorant.  Do not shave 48 hours prior to surgery. Men may shave face and neck.  Do not bring valuables to the hospital.  Contacts, dentures or bridgework may not be worn into surgery.  Leave suitcase in the car. After surgery it may be brought to your room.  For patients admitted to the hospital, checkout time is 11:00 AM the day of discharge.   Patients discharged the day of surgery will not be allowed to drive home.  Name and phone number of your driver:    Please read over the following fact sheets that you were given: Pain Booklet, Lab Information and Anesthesia Post-op Instructions   Endoscopy Care After Please read the instructions outlined below and refer to this sheet in the next few weeks. These discharge instructions provide you with general information on caring for yourself after you leave the hospital. Your doctor may also give you specific instructions. While your treatment has been planned according to the most current medical practices available, unavoidable complications occasionally occur. If you have any problems or questions after discharge, please call your doctor. HOME CARE INSTRUCTIONS Activity  You may resume your regular activity but move at a slower pace for the next 24 hours.   Take frequent rest periods for the next 24 hours.   Walking will help expel (get rid of) the air and reduce the bloated feeling in your abdomen.   No driving for 24 hours (because of the anesthesia (medicine) used during the test).   You  may shower.   Do not sign any important legal documents or operate any machinery for 24 hours (because of the anesthesia used during the test).  Nutrition  Drink plenty of fluids.   You may resume your normal diet.   Begin with a light meal and progress to your normal diet.   Avoid alcoholic beverages for 24 hours or as instructed by your caregiver.  Medications You may resume your normal medications unless your caregiver tells you otherwise. What you can expect today  You may experience abdominal discomfort such as a feeling of fullness or "gas" pains.   You may experience a sore throat for 2 to 3 days. This is normal. Gargling with salt water may help this.  Follow-up Your doctor will discuss the results of your test with you. SEEK IMMEDIATE MEDICAL CARE IF:  You have excessive nausea (feeling sick to your stomach) and/or vomiting.   You have severe abdominal pain and distention (swelling).   You have trouble swallowing.   You have a temperature over 100 F (37.8 C).   You have rectal bleeding or vomiting of blood.  Document Released: 01/17/2004 Document Revised: 05/24/2011 Document Reviewed: 07/30/2007

## 2016-08-08 ENCOUNTER — Encounter (HOSPITAL_COMMUNITY)
Admission: RE | Admit: 2016-08-08 | Discharge: 2016-08-08 | Disposition: A | Discharge status: Home / Self Care | Payer: Medicare Other | Source: Ambulatory Visit | Attending: Gastroenterology | Admitting: Gastroenterology

## 2016-08-13 ENCOUNTER — Encounter (HOSPITAL_COMMUNITY): Payer: Self-pay

## 2016-08-13 ENCOUNTER — Encounter (HOSPITAL_COMMUNITY)
Admission: RE | Admit: 2016-08-13 | Discharge: 2016-08-13 | Disposition: A | Discharge status: Home / Self Care | Payer: Medicare Other | Source: Ambulatory Visit | Attending: Gastroenterology | Admitting: Gastroenterology

## 2016-08-13 ENCOUNTER — Telehealth: Payer: Self-pay | Admitting: Radiation Oncology

## 2016-08-13 ENCOUNTER — Telehealth: Payer: Self-pay | Admitting: *Deleted

## 2016-08-13 DIAGNOSIS — G35 Multiple sclerosis: Secondary | ICD-10-CM | POA: Diagnosis not present

## 2016-08-13 DIAGNOSIS — M109 Gout, unspecified: Secondary | ICD-10-CM | POA: Diagnosis not present

## 2016-08-13 DIAGNOSIS — K449 Diaphragmatic hernia without obstruction or gangrene: Secondary | ICD-10-CM | POA: Diagnosis not present

## 2016-08-13 DIAGNOSIS — Z8546 Personal history of malignant neoplasm of prostate: Secondary | ICD-10-CM | POA: Diagnosis not present

## 2016-08-13 DIAGNOSIS — K5710 Diverticulosis of small intestine without perforation or abscess without bleeding: Secondary | ICD-10-CM | POA: Diagnosis not present

## 2016-08-13 DIAGNOSIS — F329 Major depressive disorder, single episode, unspecified: Secondary | ICD-10-CM | POA: Diagnosis not present

## 2016-08-13 DIAGNOSIS — Z800 Family history of malignant neoplasm of digestive organs: Secondary | ICD-10-CM | POA: Diagnosis not present

## 2016-08-13 DIAGNOSIS — K210 Gastro-esophageal reflux disease with esophagitis: Secondary | ICD-10-CM | POA: Diagnosis not present

## 2016-08-13 DIAGNOSIS — F419 Anxiety disorder, unspecified: Secondary | ICD-10-CM | POA: Diagnosis not present

## 2016-08-13 DIAGNOSIS — Z7982 Long term (current) use of aspirin: Secondary | ICD-10-CM | POA: Diagnosis not present

## 2016-08-13 DIAGNOSIS — K2950 Unspecified chronic gastritis without bleeding: Secondary | ICD-10-CM | POA: Diagnosis not present

## 2016-08-13 DIAGNOSIS — I252 Old myocardial infarction: Secondary | ICD-10-CM | POA: Diagnosis not present

## 2016-08-13 DIAGNOSIS — R1013 Epigastric pain: Secondary | ICD-10-CM | POA: Diagnosis present

## 2016-08-13 DIAGNOSIS — Z8661 Personal history of infections of the central nervous system: Secondary | ICD-10-CM | POA: Diagnosis not present

## 2016-08-13 DIAGNOSIS — K222 Esophageal obstruction: Secondary | ICD-10-CM | POA: Diagnosis not present

## 2016-08-13 DIAGNOSIS — E781 Pure hyperglyceridemia: Secondary | ICD-10-CM | POA: Diagnosis not present

## 2016-08-13 DIAGNOSIS — Z91030 Bee allergy status: Secondary | ICD-10-CM | POA: Diagnosis not present

## 2016-08-13 DIAGNOSIS — Z21 Asymptomatic human immunodeficiency virus [HIV] infection status: Secondary | ICD-10-CM | POA: Diagnosis not present

## 2016-08-13 DIAGNOSIS — I2510 Atherosclerotic heart disease of native coronary artery without angina pectoris: Secondary | ICD-10-CM | POA: Diagnosis not present

## 2016-08-13 DIAGNOSIS — H409 Unspecified glaucoma: Secondary | ICD-10-CM | POA: Diagnosis not present

## 2016-08-13 DIAGNOSIS — I10 Essential (primary) hypertension: Secondary | ICD-10-CM | POA: Diagnosis not present

## 2016-08-13 DIAGNOSIS — M1990 Unspecified osteoarthritis, unspecified site: Secondary | ICD-10-CM | POA: Diagnosis not present

## 2016-08-13 LAB — BASIC METABOLIC PANEL
Anion gap: 10 (ref 5–15)
BUN: 28 mg/dL — ABNORMAL HIGH (ref 6–20)
CO2: 18 mmol/L — ABNORMAL LOW (ref 22–32)
Calcium: 9.9 mg/dL (ref 8.9–10.3)
Chloride: 112 mmol/L — ABNORMAL HIGH (ref 101–111)
Creatinine, Ser: 1.39 mg/dL — ABNORMAL HIGH (ref 0.61–1.24)
GFR calc Af Amer: 57 mL/min — ABNORMAL LOW (ref >60)
GFR calc non Af Amer: 49 mL/min — ABNORMAL LOW (ref >60)
Glucose, Bld: 116 mg/dL — ABNORMAL HIGH (ref 65–99)
Potassium: 4 mmol/L (ref 3.5–5.1)
Sodium: 140 mmol/L (ref 135–145)

## 2016-08-13 LAB — CBC
HCT: 44.8 % (ref 39.0–52.0)
Hemoglobin: 14.9 g/dL (ref 13.0–17.0)
MCH: 29.4 pg (ref 26.0–34.0)
MCHC: 33.3 g/dL (ref 30.0–36.0)
MCV: 88.5 fL (ref 78.0–100.0)
Platelets: 141 10*3/uL — ABNORMAL LOW (ref 150–400)
RBC: 5.06 MIL/uL (ref 4.22–5.81)
RDW: 14.4 % (ref 11.5–15.5)
WBC: 3.5 10*3/uL — ABNORMAL LOW (ref 4.0–10.5)

## 2016-08-13 NOTE — Telephone Encounter (Signed)
Called patient to discuss implant, spoke with patient and he will have his relative to call me.

## 2016-08-13 NOTE — Telephone Encounter (Signed)
I spoke with the patient and he is interested in radioactive seed implant. I will let Enid Derry know to coordinate.

## 2016-08-13 NOTE — Anesthesia Preprocedure Evaluation (Signed)
Anesthesia Evaluation  Patient identified by MRN, date of birth, ID band Patient awake    Reviewed: Allergy & Precautions, NPO status , Patient's Chart, lab work & pertinent test results  History of Anesthesia Complications (+) history of anesthetic complications (hx failed conscious sedation for TCS)  Airway Mallampati: II  TM Distance: >3 FB     Dental  (+) Poor Dentition, Chipped   Pulmonary    breath sounds clear to auscultation       Cardiovascular hypertension, Pt. on medications + CAD and + Past MI   Rhythm:Regular Rate:Normal     Neuro/Psych PSYCHIATRIC DISORDERS Anxiety Depression  Neuromuscular disease (hx MS)    GI/Hepatic GERD  Medicated and Controlled,  Endo/Other    Renal/GU      Musculoskeletal   Abdominal   Peds  Hematology   Anesthesia Other Findings   Reproductive/Obstetrics                             Anesthesia Physical Anesthesia Plan  ASA: III  Anesthesia Plan: MAC   Post-op Pain Management:    Induction: Intravenous  Airway Management Planned: Simple Face Mask  Additional Equipment:   Intra-op Plan:   Post-operative Plan:   Informed Consent: I have reviewed the patients History and Physical, chart, labs and discussed the procedure including the risks, benefits and alternatives for the proposed anesthesia with the patient or authorized representative who has indicated his/her understanding and acceptance.     Plan Discussed with:   Anesthesia Plan Comments:         Anesthesia Quick Evaluation

## 2016-08-14 ENCOUNTER — Ambulatory Visit (HOSPITAL_COMMUNITY): Payer: Medicare Other | Admitting: Anesthesiology

## 2016-08-14 ENCOUNTER — Telehealth: Payer: Self-pay | Admitting: Gastroenterology

## 2016-08-14 ENCOUNTER — Encounter (HOSPITAL_COMMUNITY): Payer: Self-pay | Admitting: *Deleted

## 2016-08-14 ENCOUNTER — Ambulatory Visit (HOSPITAL_COMMUNITY)
Admission: RE | Admit: 2016-08-14 | Discharge: 2016-08-14 | Disposition: A | Discharge status: Home / Self Care | Payer: Medicare Other | Source: Ambulatory Visit | Attending: Gastroenterology | Admitting: Gastroenterology

## 2016-08-14 ENCOUNTER — Encounter (HOSPITAL_COMMUNITY)
Admission: RE | Disposition: A | Discharge status: Home / Self Care | Payer: Self-pay | Source: Ambulatory Visit | Attending: Gastroenterology

## 2016-08-14 DIAGNOSIS — R1013 Epigastric pain: Secondary | ICD-10-CM | POA: Diagnosis not present

## 2016-08-14 DIAGNOSIS — K2950 Unspecified chronic gastritis without bleeding: Secondary | ICD-10-CM | POA: Diagnosis not present

## 2016-08-14 DIAGNOSIS — K2970 Gastritis, unspecified, without bleeding: Secondary | ICD-10-CM | POA: Diagnosis not present

## 2016-08-14 DIAGNOSIS — Z8546 Personal history of malignant neoplasm of prostate: Secondary | ICD-10-CM | POA: Insufficient documentation

## 2016-08-14 DIAGNOSIS — M109 Gout, unspecified: Secondary | ICD-10-CM | POA: Insufficient documentation

## 2016-08-14 DIAGNOSIS — K5710 Diverticulosis of small intestine without perforation or abscess without bleeding: Secondary | ICD-10-CM | POA: Insufficient documentation

## 2016-08-14 DIAGNOSIS — I252 Old myocardial infarction: Secondary | ICD-10-CM | POA: Insufficient documentation

## 2016-08-14 DIAGNOSIS — K210 Gastro-esophageal reflux disease with esophagitis: Secondary | ICD-10-CM | POA: Diagnosis not present

## 2016-08-14 DIAGNOSIS — Z21 Asymptomatic human immunodeficiency virus [HIV] infection status: Secondary | ICD-10-CM | POA: Diagnosis not present

## 2016-08-14 DIAGNOSIS — E781 Pure hyperglyceridemia: Secondary | ICD-10-CM | POA: Insufficient documentation

## 2016-08-14 DIAGNOSIS — K2960 Other gastritis without bleeding: Secondary | ICD-10-CM

## 2016-08-14 DIAGNOSIS — F419 Anxiety disorder, unspecified: Secondary | ICD-10-CM | POA: Insufficient documentation

## 2016-08-14 DIAGNOSIS — F329 Major depressive disorder, single episode, unspecified: Secondary | ICD-10-CM | POA: Insufficient documentation

## 2016-08-14 DIAGNOSIS — I2510 Atherosclerotic heart disease of native coronary artery without angina pectoris: Secondary | ICD-10-CM | POA: Insufficient documentation

## 2016-08-14 DIAGNOSIS — H409 Unspecified glaucoma: Secondary | ICD-10-CM | POA: Diagnosis not present

## 2016-08-14 DIAGNOSIS — K449 Diaphragmatic hernia without obstruction or gangrene: Secondary | ICD-10-CM | POA: Diagnosis not present

## 2016-08-14 DIAGNOSIS — R1310 Dysphagia, unspecified: Secondary | ICD-10-CM

## 2016-08-14 DIAGNOSIS — K222 Esophageal obstruction: Secondary | ICD-10-CM | POA: Diagnosis not present

## 2016-08-14 DIAGNOSIS — G35 Multiple sclerosis: Secondary | ICD-10-CM | POA: Insufficient documentation

## 2016-08-14 DIAGNOSIS — T39395A Adverse effect of other nonsteroidal anti-inflammatory drugs [NSAID], initial encounter: Secondary | ICD-10-CM

## 2016-08-14 DIAGNOSIS — Z8661 Personal history of infections of the central nervous system: Secondary | ICD-10-CM | POA: Insufficient documentation

## 2016-08-14 DIAGNOSIS — M1990 Unspecified osteoarthritis, unspecified site: Secondary | ICD-10-CM | POA: Diagnosis not present

## 2016-08-14 DIAGNOSIS — Z91030 Bee allergy status: Secondary | ICD-10-CM | POA: Insufficient documentation

## 2016-08-14 DIAGNOSIS — Z7982 Long term (current) use of aspirin: Secondary | ICD-10-CM | POA: Insufficient documentation

## 2016-08-14 DIAGNOSIS — I10 Essential (primary) hypertension: Secondary | ICD-10-CM | POA: Insufficient documentation

## 2016-08-14 DIAGNOSIS — Z800 Family history of malignant neoplasm of digestive organs: Secondary | ICD-10-CM | POA: Insufficient documentation

## 2016-08-14 HISTORY — PX: BIOPSY: SHX5522

## 2016-08-14 HISTORY — PX: SAVORY DILATION: SHX5439

## 2016-08-14 HISTORY — PX: ESOPHAGOGASTRODUODENOSCOPY (EGD) WITH PROPOFOL: SHX5813

## 2016-08-14 SURGERY — ESOPHAGOGASTRODUODENOSCOPY (EGD) WITH PROPOFOL
Anesthesia: Monitor Anesthesia Care

## 2016-08-14 MED ORDER — LIDOCAINE VISCOUS 2 % MT SOLN
OROMUCOSAL | Status: DC | PRN
  Administered 2016-08-14: 20 mL via OROMUCOSAL

## 2016-08-14 MED ORDER — PROPOFOL 10 MG/ML IV BOLUS
INTRAVENOUS | Status: AC
  Filled 2016-08-14: qty 80

## 2016-08-14 MED ORDER — LIDOCAINE HCL (PF) 1 % IJ SOLN
INTRAMUSCULAR | Status: AC
  Filled 2016-08-14: qty 15

## 2016-08-14 MED ORDER — CHLORHEXIDINE GLUCONATE CLOTH 2 % EX PADS: 6.0000 | MEDICATED_PAD | Freq: Once | CUTANEOUS | Status: DC

## 2016-08-14 MED ORDER — LIDOCAINE VISCOUS 2 % MT SOLN
OROMUCOSAL | Status: AC
  Filled 2016-08-14: qty 15

## 2016-08-14 MED ORDER — LIDOCAINE HCL (CARDIAC) 10 MG/ML IV SOLN
INTRAVENOUS | Status: DC | PRN
  Administered 2016-08-14: 50 mg via INTRAVENOUS

## 2016-08-14 MED ORDER — PROPOFOL 500 MG/50ML IV EMUL
INTRAVENOUS | Status: DC | PRN
  Administered 2016-08-14: 100 ug/kg/min via INTRAVENOUS

## 2016-08-14 MED ORDER — SIMETHICONE 40 MG/0.6ML PO SUSP
ORAL | Status: AC
  Filled 2016-08-14: qty 0.6

## 2016-08-14 MED ORDER — FENTANYL CITRATE (PF) 100 MCG/2ML IJ SOLN
25.0000 ug | INTRAMUSCULAR | Status: AC
  Administered 2016-08-14 (×2): 25 ug via INTRAVENOUS

## 2016-08-14 MED ORDER — MIDAZOLAM HCL 2 MG/2ML IJ SOLN
INTRAMUSCULAR | Status: AC
  Filled 2016-08-14: qty 2

## 2016-08-14 MED ORDER — LACTATED RINGERS IV SOLN
INTRAVENOUS | Status: DC
  Administered 2016-08-14: 07:00:00 via INTRAVENOUS

## 2016-08-14 MED ORDER — MIDAZOLAM HCL 2 MG/2ML IJ SOLN
1.0000 mg | INTRAMUSCULAR | Status: AC
Start: 2016-08-14 — End: 2016-08-14
  Administered 2016-08-14: 2 mg via INTRAVENOUS

## 2016-08-14 MED ORDER — PROPOFOL 10 MG/ML IV BOLUS
INTRAVENOUS | Status: DC | PRN
  Administered 2016-08-14: 20 mg via INTRAVENOUS

## 2016-08-14 MED ORDER — FENTANYL CITRATE (PF) 100 MCG/2ML IJ SOLN
INTRAMUSCULAR | Status: AC
  Filled 2016-08-14: qty 2

## 2016-08-14 MED ORDER — MINERAL OIL PO OIL
TOPICAL_OIL | ORAL | Status: AC
  Filled 2016-08-14: qty 30

## 2016-08-14 MED ORDER — STERILE WATER FOR IRRIGATION IR SOLN
Status: DC | PRN
  Administered 2016-08-14: 100 mL

## 2016-08-14 NOTE — Op Note (Signed)
[facility] Patient Name: Lyall Tumolo Procedure Date: 08/14/2016 7:13 AM MRN: [id number] Date of Birth: 07/05/1945 Attending MD: Barney Drain , MD CSN: [id number] Age: 71 Admit Type: Outpatient Procedure:                Upper GI endoscopy WITH COLD FORCEPS                            BIOPSY/ESOPHAGEAL DILATION Indications:              Epigastric abdominal pain, Dysphagia Providers:                Barney Drain, MD, Otis Peak B. Sharon Seller, RN, Rosina Lowenstein, RN, Aram Candela Referring MD:             Elayne Snare. Luking Medicines:                Propofol per Anesthesia Complications:            No immediate complications. Estimated Blood Loss:     Estimated blood loss was minimal. Procedure:                Pre-Anesthesia Assessment:                           - Prior to the procedure, a History and Physical                            was performed, and patient medications and                            allergies were reviewed. The patient's tolerance of                            previous anesthesia was also reviewed. The risks                            and benefits of the procedure and the sedation                            options and risks were discussed with the patient.                            All questions were answered, and informed consent                            was obtained. Prior Anticoagulants: The patient has                            taken aspirin. ASA Grade Assessment: II - A patient                            with mild systemic disease. After reviewing the  risks and benefits, the patient was deemed in                            satisfactory condition to undergo the procedure.                            After obtaining informed consent, the endoscope was                            passed under direct vision. Throughout the                            procedure, the patient's blood pressure, pulse, and                 oxygen saturations were monitored continuously. The                            [id number]) was introduced through the                            mouth, and advanced to the second part of duodenum.                            After obtaining informed consent, the endoscope was                            passed under direct vision. Throughout the                            procedure, the patient's blood pressure, pulse, and                            oxygen saturations were monitored continuously.The                            upper GI endoscopy was technically difficult and                            complex due to the patient's oxygen desaturation.                            Successful completion of the procedure was aided by                            managing the patient's medical instability. The                            patient tolerated the procedure fairly well. Scope In: 7:37:50 AM Scope Out: 7:48:27 AM Total Procedure Duration: 0 hours 10 minutes 37 seconds  Findings:      A moderate Schatzki ring (acquired) was found at the gastroesophageal       junction. A guidewire was placed and the scope was withdrawn. Dilation       was performed with a Savary dilator with mild  resistance at 14 mm, 15       mm, 16 mm and 17 mm. Estimated blood loss was minimal.      LA Grade A (one or more mucosal breaks less than 5 mm, not extending       between tops of 2 mucosal folds) esophagitis with no bleeding was found.      A small hiatal hernia was present.      Patchy mild inflammation characterized by erosions and erythema was       found in the gastric body and in the gastric antrum. Biopsies were taken       with a cold forceps for Helicobacter pylori testing.      A small diverticulum was found in the second portion of the duodenum. Impression:               - Moderate Schatzki ring. Dilated.                           - LA Grade A reflux esophagitis.                            - Small hiatal hernia.                           - MILD Gastritis.                           - ONE Duodenal diverticulum. Moderate Sedation:      Per Anesthesia Care Recommendation:           - Resume previous diet.                           - Continue present medications.                           - Await pathology results.                           - Return to my office in 4 months.                           - Patient has a contact number available for                            emergencies. The signs and symptoms of potential                            delayed complications were discussed with the                            patient. Return to normal activities tomorrow.                            Written discharge instructions were provided to the                            patient. Procedure Code(s):        ---  Professional ---                           [phone], Esophagogastroduodenoscopy, flexible,                            transoral; with insertion of guide wire followed by                            passage of dilator(s) through esophagus over guide                            wire                           [id number], Esophagogastroduodenoscopy, flexible,                            transoral; with biopsy, single or multiple Diagnosis Code(s):        --- Professional ---                           K22.2, Esophageal obstruction                           K21.0, Gastro-esophageal reflux disease with                            esophagitis                           K44.9, Diaphragmatic hernia without obstruction or                            gangrene                           K29.70, Gastritis, unspecified, without bleeding                           R10.13, Epigastric pain                           R13.10, Dysphagia, unspecified                           K57.10, Diverticulosis of small intestine without                            perforation or abscess without bleeding CPT copyright  2016 American Medical Association. All rights reserved. The codes documented in this report are preliminary and upon coder review may  be revised to meet current compliance requirements. Barney Drain, MD Barney Drain, MD 08/14/2016 8:07:42 AM This report has been signed electronically. Number of Addenda: 0

## 2016-08-14 NOTE — Discharge Instructions (Signed)
I dilated your esophagus. You have a stricture near the base of your esophagus.  YOU HAVE A SMALL HIATAL HERNIA. You have ACID REFLUX CAUSING ESOPHAGITIS AND gastritis DUE TO ASPIRIN USE. I biopsied your stomach.   CONTINUE YOUR WEIGHT LOSS EFFORTS. LOSE 20-30 LBS.  DRINK WATER TO KEEP YOUR URINE LIGHT YELLOW.  AVOID REFLUX TRIGGERS. SEE INFO BELOW.  FOLLOW A LOW FAT DIET. SEE INFO BELOW.  CONTINUE PROTONIX. TAKE 30 MINUTES PRIOR TO BREAKFAST.  YOUR BIOPSY RESULTS WILL BE AVAILABLE IN MY CHART AFTER MAR 1 and my OFFICE WILL CONTACT YOU IN 10-14 DAYS WITH YOUR RESULTS.   FOLLOW UP IN 4 MOS.  UPPER ENDOSCOPY AFTER CARE Read the instructions outlined below and refer to this sheet in the next week. These discharge instructions provide you with general information on caring for yourself after you leave the hospital. While your treatment has been planned according to the most current medical practices available, unavoidable complications occasionally occur. If you have any problems or questions after discharge, call DR. Linnae Rasool, [phone].  ACTIVITY  You may resume your regular activity, but move at a slower pace for the next 24 hours.   Take frequent rest periods for the next 24 hours.   Walking will help get rid of the air and reduce the bloated feeling in your belly (abdomen).   No driving for 24 hours (because of the medicine (anesthesia) used during the test).   You may shower.   Do not sign any important legal documents or operate any machinery for 24 hours (because of the anesthesia used during the test).    NUTRITION  Drink plenty of fluids.   You may resume your normal diet as instructed by your doctor.   Begin with a light meal and progress to your normal diet. Heavy or fried foods are harder to digest and may make you feel sick to your stomach (nauseated).   Avoid alcoholic beverages for 24 hours or as instructed.    MEDICATIONS  You may resume your normal  medications.   WHAT YOU CAN EXPECT TODAY  Some feelings of bloating in the abdomen.   Passage of more gas than usual.    IF YOU HAD A BIOPSY TAKEN DURING THE UPPER ENDOSCOPY:  Eat a soft diet IF YOU HAVE NAUSEA, BLOATING, ABDOMINAL PAIN, OR VOMITING.    FINDING OUT THE RESULTS OF YOUR TEST Not all test results are available during your visit. DR. Oneida Alar WILL CALL YOU WITHIN 14 DAYS OF YOUR PROCEDUE WITH YOUR RESULTS. Do not assume everything is normal if you have not heard from DR. Deshia Vanderhoof, CALL HER OFFICE AT [phone].  SEEK IMMEDIATE MEDICAL ATTENTION AND CALL THE OFFICE: [phone] IF:  You have more than a spotting of blood in your stool.   Your belly is swollen (abdominal distention).   You are nauseated or vomiting.   You have a temperature over 101F.   You have abdominal pain or discomfort that is severe or gets worse throughout the day.  Gastritis  Gastritis is an inflammation (the body's way of reacting to injury and/or infection) of the stomach. It is often caused by viral or bacterial (germ) infections. It can also be caused BY ASPIRIN, BC/GOODY POWDER'S, (IBUPROFEN) MOTRIN, OR ALEVE (NAPROXEN), chemicals (including alcohol), SPICY FOODS, and medications. This illness may be associated with generalized malaise (feeling tired, not well), UPPER ABDOMINAL STOMACH cramps, and fever. One common bacterial cause of gastritis is an organism known as H. Pylori. This can be  treated with antibiotics.   ESOPHAGEAL STRICTURE  Esophageal strictures can be caused by stomach acid backing up into the tube that carries food from the mouth down to the stomach (lower esophagus).  TREATMENT There are a number of medicines used to treat reflux/stricture, including: Antacids.  Proton-pump inhibitors:PROTONIX Zantac or PEPCID  HOME CARE INSTRUCTIONS Eat 2-3 hours before going to bed.  Try to reach and maintain a healthy weight.  Do not eat just a few very large meals. Instead,  eat 4 TO 6 smaller meals throughout the day.  Try to identify foods and beverages that make your symptoms worse, and avoid these.  Avoid tight clothing.  Do not exercise right after eating.   Lifestyle and home remedies TO MANAGE REFLUX/CHEST PAIN  You may eliminate or reduce the frequency of heartburn by making the following lifestyle changes:   Control your weight. Being overweight is a major risk factor for heartburn and GERD. Excess pounds put pressure on your abdomen, pushing up your stomach and causing acid to back up into your esophagus.    Eat smaller meals. 4 TO 6 MEALS A DAY. This reduces pressure on the lower esophageal sphincter, helping to prevent the valve from opening and acid from washing back into your esophagus.    Loosen your belt. Clothes that fit tightly around your waist put pressure on your abdomen and the lower esophageal sphincter.    Eliminate heartburn triggers. Everyone has specific triggers. Common triggers such as fatty or fried foods, spicy food, tomato sauce, carbonated beverages, alcohol, chocolate, mint, garlic, onion, caffeine and nicotine may make heartburn worse.    Avoid stooping or bending. Tying your shoes is OK. Bending over for longer periods to weed your garden isn't, especially soon after eating.    Don't lie down after a meal. Wait at least three to four hours after eating before going to bed, and don't lie down right after eating.    PUT THE HEAD OF YOUR BED ON 6 INCH BLOCKS.   Alternative medicine  Several home remedies exist for treating GERD, but they provide only temporary relief. They include drinking baking soda (sodium bicarbonate) added to water or drinking other fluids such as baking soda mixed with cream of tartar and water.   Although these liquids create temporary relief by neutralizing, washing away or buffering acids, eventually they aggravate the situation by adding gas and fluid to your stomach, increasing pressure  and causing more acid reflux. Further, adding more sodium to your diet may increase your blood pressure and add stress to your heart, and excessive bicarbonate ingestion can alter the acid-base balance in your body.   Low-Fat Diet BREADS, CEREALS, PASTA, RICE, DRIED PEAS, AND BEANS These products are high in carbohydrates and most are low in fat. Therefore, they can be increased in the diet as substitutes for fatty foods. They too, however, contain calories and should not be eaten in excess. Cereals can be eaten for snacks as well as for breakfast.   FRUITS AND VEGETABLES It is good to eat fruits and vegetables. Besides being sources of fiber, both are rich in vitamins and some minerals. They help you get the daily allowances of these nutrients. Fruits and vegetables can be used for snacks and desserts.  MEATS Limit lean meat, chicken, [place], and fish to no more than 6 ounces per day. Beef, Pork, and Lamb Use lean cuts of beef, pork, and lamb. Lean cuts include:  Extra-lean ground beef.  Arm roast.  Sirloin  tip.  Center-cut ham.  Round steak.  Loin chops.  Rump roast.  Tenderloin.  Trim all fat off the outside of meats before cooking. It is not necessary to severely decrease the intake of red meat, but lean choices should be made. Lean meat is rich in protein and contains a highly absorbable form of iron. Premenopausal women, in particular, should avoid reducing lean red meat because this could increase the risk for low red blood cells (iron-deficiency anemia).  Chicken and [place] These are good sources of protein. The fat of poultry can be reduced by removing the skin and underlying fat layers before cooking. Chicken and [place] can be substituted for lean red meat in the diet. Poultry should not be fried or covered with high-fat sauces. Fish and Shellfish Fish is a good source of protein. Shellfish contain cholesterol, but they usually are low in saturated fatty acids. The preparation of  fish is important. Like chicken and [place], they should not be fried or covered with high-fat sauces. EGGS Egg whites contain no fat or cholesterol. They can be eaten often. Try 1 to 2 egg whites instead of whole eggs in recipes or use egg substitutes that do not contain yolk. MILK AND DAIRY PRODUCTS Use skim or 1% milk instead of 2% or whole milk. Decrease whole milk, natural, and processed cheeses. Use nonfat or low-fat (2%) cottage cheese or low-fat cheeses made from vegetable oils. Choose nonfat or low-fat (1 to 2%) yogurt. Experiment with evaporated skim milk in recipes that call for heavy cream. Substitute low-fat yogurt or low-fat cottage cheese for sour cream in dips and salad dressings. Have at least 2 servings of low-fat dairy products, such as 2 glasses of skim (or 1%) milk each day to help get your daily calcium intake. FATS AND OILS Reduce the total intake of fats, especially saturated fat. Butterfat, lard, and beef fats are high in saturated fat and cholesterol. These should be avoided as much as possible. Vegetable fats do not contain cholesterol, but certain vegetable fats, such as coconut oil, palm oil, and palm kernel oil are very high in saturated fats. These should be limited. These fats are often used in bakery goods, processed foods, popcorn, oils, and nondairy creamers. Vegetable shortenings and some peanut butters contain hydrogenated oils, which are also saturated fats. Read the labels on these foods and check for saturated vegetable oils. Unsaturated vegetable oils and fats do not raise blood cholesterol. However, they should be limited because they are fats and are high in calories. Total fat should still be limited to 30% of your daily caloric intake. Desirable liquid vegetable oils are corn oil, cottonseed oil, olive oil, canola oil, safflower oil, soybean oil, and sunflower oil. Peanut oil is not as good, but small amounts are acceptable. Buy a heart-healthy tub margarine that  has no partially hydrogenated oils in the ingredients. Mayonnaise and salad dressings often are made from unsaturated fats, but they should also be limited because of their high calorie and fat content. Seeds, nuts, peanut butter, olives, and avocados are high in fat, but the fat is mainly the unsaturated type. These foods should be limited mainly to avoid excess calories and fat. OTHER EATING TIPS Snacks  Most sweets should be limited as snacks. They tend to be rich in calories and fats, and their caloric content outweighs their nutritional value. Some good choices in snacks are graham crackers, melba toast, soda crackers, bagels (no egg), English muffins, fruits, and vegetables. These snacks are preferable  to snack crackers, [place] fries, TORTILLA CHIPS, and POTATO chips. Popcorn should be air-popped or cooked in small amounts of liquid vegetable oil. Desserts Eat fruit, low-fat yogurt, and fruit ices instead of pastries, cake, and cookies. Sherbet, angel food cake, gelatin dessert, frozen low-fat yogurt, or other frozen products that do not contain saturated fat (pure fruit juice bars, frozen ice pops) are also acceptable.  COOKING METHODS Choose those methods that use little or no fat. They include: Poaching.  Braising.  Steaming.  Grilling.  Baking.  Stir-frying.  Broiling.  Microwaving.  Foods can be cooked in a nonstick pan without added fat, or use a nonfat cooking spray in regular cookware. Limit fried foods and avoid frying in saturated fat. Add moisture to lean meats by using water, broth, cooking wines, and other nonfat or low-fat sauces along with the cooking methods mentioned above. Soups and stews should be chilled after cooking. The fat that forms on top after a few hours in the refrigerator should be skimmed off. When preparing meals, avoid using excess salt. Salt can contribute to raising blood pressure in some people.  EATING AWAY FROM HOME Order entres, potatoes, and  vegetables without sauces or butter. When meat exceeds the size of a deck of cards (3 to 4 ounces), the rest can be taken home for another meal. Choose vegetable or fruit salads and ask for low-calorie salad dressings to be served on the side. Use dressings sparingly. Limit high-fat toppings, such as bacon, crumbled eggs, cheese, sunflower seeds, and olives. Ask for heart-healthy tub margarine instead of butter.

## 2016-08-14 NOTE — Telephone Encounter (Signed)
LMOM to call.

## 2016-08-14 NOTE — H&P (Signed)
Primary Care Physician:  Sallee Lange, MD Primary Gastroenterologist:  Dr. Oneida Alar  Pre-Procedure History & Physical: HPI:  Kerry Solis is a 71 y.o. male here for   Past Medical History:  Diagnosis Date  . Anxiety   . Arthritis   . Cryptococcal meningitis ([place])   . Depression   . Elevated liver enzymes   . Glaucoma   . High triglycerides   . History of gout   . HIV (human immunodeficiency virus infection) ([place])   . Hypertension   . Leukopenia    Low CD4  . Multiple sclerosis ([place])   . Myocardial infarction    45 years ago  . Prostate cancer [facility])     Past Surgical History:  Procedure Laterality Date  . CHOLECYSTECTOMY  06/10/2011   Procedure: LAPAROSCOPIC CHOLECYSTECTOMY;  Surgeon: Donato Heinz, MD;  Location: [facility] ORS;  Service: General;  Laterality: N/A;  . COLONOSCOPY  03/10/2009   [id number] internal hemorrhoids/6-mm sessile ascending colon polyp/tortuous colon, diverticulosis. TA  . COLONOSCOPY WITH PROPOFOL N/A 04/19/2015   Dr. Oneida Alar: sessile serrated adenoma, surveillance in 5 years   . ESOPHAGOGASTRODUODENOSCOPY  06/23/2008   [id number] gastritis/schatzki ring  . NECK SURGERY     disc  . POLYPECTOMY N/A 04/19/2015   Procedure: POLYPECTOMY;  Surgeon: Danie Binder, MD;  Location: [facility] ORS;  Service: Endoscopy;  Laterality: N/A;  ascending colon  . PROSTATE BIOPSY      Prior to Admission medications   Medication Sig Start Date End Date Taking? Authorizing Provider  allopurinol (ZYLOPRIM) 100 MG tablet TAKE (1) TABLET BY MOUTH ONCE DAILY FOR GOUT. 02/28/16  Yes Kathyrn Drown, MD  amitriptyline (ELAVIL) 25 MG tablet TAKE TWO TABLETS BY MOUTH DAILY AT BEDTIME 12/11/14  Yes Historical Provider, MD  amLODipine (NORVASC) 10 MG tablet TAKE (1) TABLET BY MOUTH DAILY FOR HIGH BLOOD PRESSURE. 05/31/16  Yes Kathyrn Drown, MD  amoxicillin (AMOXIL) 500 MG tablet Take 1 tablet (500 mg total) by mouth 3 (three) times daily. 07/24/16  Yes Kathyrn Drown, MD  aspirin EC 81  MG tablet Take 81 mg by mouth daily.   Yes Historical Provider, MD  carbamazepine (TEGRETOL) 100 MG chewable tablet Chew by mouth 2 (two) times daily.   Yes Historical Provider, MD  chlorzoxazone (PARAFON) 500 MG tablet Take by mouth 4 (four) times daily as needed for muscle spasms.   Yes Historical Provider, MD  Cholecalciferol (VITAMIN D PO) Take 1 tablet by mouth daily.   Yes Historical Provider, MD  citalopram (CELEXA) 20 MG tablet Take 20 mg by mouth daily.   Yes Historical Provider, MD  DESCOVY 200-25 MG tablet TAKE ONE TABLET BY MOUTH ONCE DAILY 04/07/15  Yes Historical Provider, MD  folic acid (FOLVITE) 1 MG tablet Take 1 mg by mouth daily.   Yes Historical Provider, MD  interferon beta-1a (REBIF) 44 MCG/0.5ML SOSY injection Inject 44 mcg into the skin 3 (three) times a week.   Yes Historical Provider, MD  latanoprost (XALATAN) 0.005 % ophthalmic solution Place 1 drop into the left eye at bedtime.  07/15/14  Yes Historical Provider, MD  lisinopril-hydrochlorothiazide (PRINZIDE,ZESTORETIC) 10-12.5 MG tablet TAKE (1) TABLET BY MOUTH DAILY FOR HIGH BLOOD PRESSURE. 05/31/16  Yes Kathyrn Drown, MD  Multiple Vitamin (MULTIVITAMIN WITH MINERALS) TABS tablet Take 1 tablet by mouth daily.   Yes Historical Provider, MD  oxyCODONE (ROXICODONE) 5 MG immediate release tablet Take 1 tablet (5 mg total) by mouth every 6 (six) hours as needed  for severe pain. 05/31/16  Yes Kathyrn Drown, MD  oxyCODONE-acetaminophen (PERCOCET) 10-325 MG tablet Take 1 tablet by mouth every 8 (eight) hours as needed for pain. 04/20/16  Yes Kathyrn Drown, MD  pantoprazole (PROTONIX) 40 MG tablet Take 1 tablet (40 mg total) by mouth daily. 07/26/16  Yes Annitta Needs, NP  pravastatin (PRAVACHOL) 40 MG tablet TAKE 1 TABLET BY MOUTH AT BEDTIME FOR CHOLESTEROL 05/31/16  Yes Kathyrn Drown, MD  Pyridoxine HCl (VITAMIN B-6 PO) Take 1 tablet by mouth daily.   Yes Historical Provider, MD  sertraline (ZOLOFT) 100 MG tablet Take 1 tablet  (100 mg total) by mouth daily. 05/31/16  Yes Kathyrn Drown, MD  TIVICAY 50 MG tablet TK 1 T PO QD. STOP ATRIPLA 07/23/16  Yes Historical Provider, MD  acetaminophen (TYLENOL) 500 MG tablet Take 1,000 mg by mouth 3 (three) times daily as needed for mild pain or moderate pain.    Historical Provider, MD    Allergies as of 07/26/2016 - Review Complete 07/26/2016  Allergen Reaction Noted  . Yellow jacket venom Swelling 03/14/2016    Family History  Problem Relation Age of Onset  . Hypertension Mother   . Heart attack Mother   . Heart attack Father   . Cancer Sister     Breast  . Diabetes Sister   . Diabetes Brother   . Cancer Brother     unknown  . Cancer Brother     colon  . Cancer Sister     breast  . Cancer Brother     prostate  . Cancer Brother     colon    Social History   Social History  . Marital status: Divorced    Spouse name: N/A  . Number of children: N/A  . Years of education: N/A   Occupational History  . Not on file.   Social History Main Topics  . Smoking status: Never Smoker  . Smokeless tobacco: Never Used  . Alcohol use No  . Drug use: No  . Sexual activity: No   Other Topics Concern  . Not on file   Social History Narrative  . No narrative on file    Review of Systems: See HPI, otherwise negative ROS   Physical Exam: BP (!) 151/77   Pulse 80   Temp 98.7 F (37.1 C) (Oral)   Resp 18   SpO2 100%  General:   Alert,  pleasant and cooperative in NAD Head:  Normocephalic and atraumatic. Neck:  Supple; Lungs:  Clear throughout to auscultation.    Heart:  Regular rate and rhythm. Abdomen:  Soft, nontender and nondistended. Normal bowel sounds, without guarding, and without rebound.   Neurologic:  Alert and  oriented x4;  grossly normal neurologically.  Impression/Plan:     DYSPHAGIA  PLAN:  EGD/DIL TODAY. DISCUSSED PROCEDURE, BENEFITS, & RISKS: < 1% chance of medication reaction, perforation, OR bleeding.

## 2016-08-14 NOTE — Telephone Encounter (Signed)
PLEASE CALL PT. HIS KIDNEY FUNCTION AND WHITE BLOOD CELL COUNT ARE NOT NORMAL ON HIS PREOP LABS. HE SHOULD SEE DR. [name] IR HIS ID DOCTORS WITHIN THE NEXT WEEK TO HAVE IT RECHECKED.

## 2016-08-14 NOTE — Anesthesia Procedure Notes (Signed)
Procedure Name: MAC Date/Time: 08/14/2016 7:26 AM Performed by: Vista Deck Pre-anesthesia Checklist: Patient identified, Emergency Drugs available, Suction available, Timeout performed and Patient being monitored Patient Re-evaluated:Patient Re-evaluated prior to inductionOxygen Delivery Method: Non-rebreather mask

## 2016-08-14 NOTE — Anesthesia Postprocedure Evaluation (Signed)
Anesthesia Post Note  Patient: Kerry Solis  Procedure(s) Performed: Procedure(s) (LRB): ESOPHAGOGASTRODUODENOSCOPY (EGD) WITH PROPOFOL (N/A) SAVORY DILATION (N/A) BIOPSY ESOPHAGEAL DILITATION (N/A)  Patient location during evaluation: PACU Anesthesia Type: MAC Level of consciousness: awake Pain management: satisfactory to patient Vital Signs Assessment: post-procedure vital signs reviewed and stable Respiratory status: spontaneous breathing and non-rebreather facemask Cardiovascular status: stable Anesthetic complications: no     Last Vitals:  Vitals:   08/14/16 0653 08/14/16 0800  BP: (!) 151/77 105/68  Pulse: 80 77  Resp: 18 (!) 34  Temp: 37.1 C 36.1 C    Last Pain:  Vitals:   08/14/16 0800  TempSrc:   PainSc: (P) 0-No pain                 Evo Aderman

## 2016-08-14 NOTE — Transfer of Care (Signed)
Immediate Anesthesia Transfer of Care Note  Patient: Kerry Solis  Procedure(s) Performed: Procedure(s) with comments: ESOPHAGOGASTRODUODENOSCOPY (EGD) WITH PROPOFOL (N/A) - 7:30am SAVORY DILATION (N/A) BIOPSY - gastric bx's ESOPHAGEAL DILITATION (N/A)  Patient Location: PACU  Anesthesia Type:MAC  Level of Consciousness: sedated and patient cooperative  Airway & Oxygen Therapy: Patient Spontanous Breathing and non-rebreather face mask  Post-op Assessment: Report given to RN and Post -op Vital signs reviewed and stable  Post vital signs: Reviewed and stable  Last Vitals:  Vitals:   08/14/16 0653  BP: (!) 151/77  Pulse: 80  Resp: 18  Temp: 37.1 C    Last Pain:  Vitals:   08/14/16 0728  TempSrc:   PainSc: 2       Patients Stated Pain Goal: 5 (34/19/62 2297)  Complications: No apparent anesthesia complications

## 2016-08-15 ENCOUNTER — Telehealth: Payer: Self-pay | Admitting: *Deleted

## 2016-08-15 NOTE — Telephone Encounter (Signed)
Called patient to inform of implant date, spoke with patient and he is aware of this date. 

## 2016-08-16 ENCOUNTER — Encounter (HOSPITAL_COMMUNITY): Payer: Self-pay | Admitting: Gastroenterology

## 2016-08-16 NOTE — Telephone Encounter (Signed)
Pt was informed.

## 2016-08-17 ENCOUNTER — Other Ambulatory Visit: Payer: Self-pay | Admitting: Urology

## 2016-08-17 ENCOUNTER — Encounter: Payer: Self-pay | Admitting: *Deleted

## 2016-08-17 NOTE — Progress Notes (Signed)
[facility] Psychosocial Distress Screening Clinical Social Work  Clinical Social Work was referred by distress screening protocol.  The patient scored a 5 on the Psychosocial Distress Thermometer which indicates moderate distress. Clinical Social Worker reviewed chart and phoned pt to assess for distress and other psychosocial needs. CSW left brief HIPPA compliant message for pt and encouraged return call.   ONCBCN DISTRESS SCREENING 07/30/2016  Screening Type Initial Screening  Distress experienced in past week (1-10) 5  Emotional problem type Depression;Adjusting to illness;Isolation/feeling alone;Boredom  Information Concerns Type Lack of info about diagnosis;Lack of info about treatment;Lack of info about complementary therapy choices;Lack of info about maintaining fitness  Physical Problem type Pain;Nausea/vomiting;Sleep/insomnia;Getting around;Bathing/dressing;Mouth sores/swallowing;Constipation/diarrhea;Changes in urination;Tingling hands/feet;Skin dry/itchy;Swollen arms/legs  Physician notified of physical symptoms Yes  Referral to clinical psychology No  Referral to clinical social work No  Referral to dietition No  Referral to financial advocate No  Referral to support programs No  Referral to palliative care No    Clinical Social Worker follow up needed: No.  If yes, follow up plan:  Loren Racer, Marlinda Mike, OSW-C Clinical Social Worker [place]  [facility] Phone: [phone] Fax: [phone]

## 2016-08-21 ENCOUNTER — Encounter: Payer: Self-pay | Admitting: Family Medicine

## 2016-08-21 ENCOUNTER — Ambulatory Visit (INDEPENDENT_AMBULATORY_CARE_PROVIDER_SITE_OTHER): Payer: Medicare Other | Admitting: Family Medicine

## 2016-08-21 VITALS — BP 130/88 | Ht 68.5 in | Wt 217.2 lb

## 2016-08-21 DIAGNOSIS — C61 Malignant neoplasm of prostate: Secondary | ICD-10-CM | POA: Diagnosis not present

## 2016-08-21 NOTE — Progress Notes (Signed)
   Subjective:    Patient ID: Kerry Solis, male    DOB: [birth year], 71 y.o.   MRN: [id number]  HPI Patient is here today to discuss his recent lab results and endoscopy results. Patient has no other concerns at this time.  I spent time with him going over the endoscopy results. Also spent time with him discussing prostate cancer and discussing the pros and cons of treatment. Patient wondered if he should just get treatment all together. He denies any fever chills sweats abdominal pain Review of Systems No cough wheezing vomiting    Objective:   Physical Exam  Lungs clear heart regular blood pressure good EGD results reviewed with patient surgical path route reviewed with patient Path report from prostate cancer was reviewed     Assessment & Plan:  Prostate cancer-very important for this patient go ahead with treatment I recommend seed approach Very important for this patient to follow through with appointment in later March  Follow-up here on a regular basis

## 2016-08-23 ENCOUNTER — Telehealth: Payer: Self-pay | Admitting: Gastroenterology

## 2016-08-23 NOTE — Telephone Encounter (Addendum)
Please call pt. His stomach Bx shows gastritis. HIS CBC SHOWED A LOW PLATELET COUNT AND HIS KIDNEY FUNCTION WAS NOT NORMAL.  HE NEEDS  A REPEAT CBC AND BMP WITHIN THE NEXT 7 DAYS.  CONTINUE YOUR WEIGHT LOSS EFFORTS. LOSE 20-30 LBS.  DRINK WATER TO KEEP YOUR URINE LIGHT YELLOW.  AVOID REFLUX TRIGGERS.  FOLLOW A LOW FAT DIET.   CONTINUE PROTONIX. TAKE 30 MINUTES PRIOR TO BREAKFAST.  FOLLOW UP IN 4 MOS E30 GERD/DYSPHAGIA.

## 2016-08-24 ENCOUNTER — Other Ambulatory Visit: Payer: Self-pay | Admitting: Family Medicine

## 2016-08-31 DIAGNOSIS — H40001 Preglaucoma, unspecified, right eye: Secondary | ICD-10-CM | POA: Diagnosis not present

## 2016-08-31 DIAGNOSIS — H401124 Primary open-angle glaucoma, left eye, indeterminate stage: Secondary | ICD-10-CM | POA: Diagnosis not present

## 2016-09-05 ENCOUNTER — Telehealth: Payer: Self-pay | Admitting: *Deleted

## 2016-09-05 NOTE — Telephone Encounter (Signed)
Called patient to remind of pre-seed appt. for 09-06-16, spoke with patient and he is aware of these appts.

## 2016-09-06 ENCOUNTER — Ambulatory Visit
Admission: RE | Admit: 2016-09-06 | Discharge: 2016-09-06 | Disposition: A | Discharge status: Home / Self Care | Payer: Medicare Other | Source: Ambulatory Visit | Attending: Radiation Oncology | Admitting: Radiation Oncology

## 2016-09-06 ENCOUNTER — Encounter (HOSPITAL_BASED_OUTPATIENT_CLINIC_OR_DEPARTMENT_OTHER)
Admission: RE | Admit: 2016-09-06 | Discharge: 2016-09-06 | Disposition: A | Discharge status: Home / Self Care | Payer: Medicare Other | Source: Ambulatory Visit | Attending: Urology | Admitting: Urology

## 2016-09-06 ENCOUNTER — Ambulatory Visit (HOSPITAL_COMMUNITY)
Admission: RE | Admit: 2016-09-06 | Discharge: 2016-09-06 | Disposition: A | Discharge status: Home / Self Care | Payer: Medicare Other | Source: Ambulatory Visit | Attending: Urology | Admitting: Urology

## 2016-09-06 ENCOUNTER — Encounter: Payer: Self-pay | Admitting: Medical Oncology

## 2016-09-06 ENCOUNTER — Other Ambulatory Visit: Payer: Self-pay

## 2016-09-06 DIAGNOSIS — Z01818 Encounter for other preprocedural examination: Secondary | ICD-10-CM | POA: Insufficient documentation

## 2016-09-06 DIAGNOSIS — C61 Malignant neoplasm of prostate: Secondary | ICD-10-CM | POA: Diagnosis not present

## 2016-09-06 DIAGNOSIS — M4184 Other forms of scoliosis, thoracic region: Secondary | ICD-10-CM | POA: Diagnosis not present

## 2016-09-06 DIAGNOSIS — Z7982 Long term (current) use of aspirin: Secondary | ICD-10-CM | POA: Diagnosis not present

## 2016-09-06 DIAGNOSIS — Z510 Encounter for antineoplastic radiation therapy: Secondary | ICD-10-CM | POA: Diagnosis not present

## 2016-09-06 NOTE — Progress Notes (Signed)
  Radiation Oncology         [phone]) [phone] ________________________________  Name: SHEDRIC FREDERICKS MRN: [id number]  Date: 09/06/2016  DOB: 05-May-1946  SIMULATION AND TREATMENT PLANNING NOTE PUBIC ARCH STUDY  VP:XTGGY [name], MD  Franchot Gallo, MD  DIAGNOSIS: 71 year-old gentleman with Stage T2c N0 M0 adenocarcinoma of the prostate with Gleason Score of 3+4, and PSA of 4.1     ICD-9-CM ICD-10-CM   1. Prostate cancer ([place]) [place]:  The patient presented today for evaluation for possible prostate seed implant. He was brought to the radiation planning suite and placed supine on the CT couch. A 3-dimensional image study set was obtained in upload to the planning computer. There, on each axial slice, I contoured the prostate gland. Then, using three-dimensional radiation planning tools I reconstructed the prostate in view of the structures from the transperineal needle pathway to assess for possible pubic arch interference. In doing so, I did not appreciate any pubic arch interference. Also, the patient's prostate volume was estimated based on the drawn structure. The volume was 21 cc.  Given the pubic arch appearance and prostate volume, patient remains a good candidate to proceed with prostate seed implant. Today, he freely provided informed written consent to proceed.    PLAN: The patient will undergo prostate seed implant.   ________________________________  Sheral Apley. Tammi Klippel, M.D.

## 2016-09-07 ENCOUNTER — Ambulatory Visit: Payer: Medicare Other | Admitting: Radiation Oncology

## 2016-09-07 ENCOUNTER — Ambulatory Visit: Payer: Self-pay | Admitting: Radiation Oncology

## 2016-09-17 NOTE — Telephone Encounter (Signed)
PATIENT SCHEDULED FOR FOLLOW UP 

## 2016-09-18 ENCOUNTER — Other Ambulatory Visit: Payer: Self-pay

## 2016-09-18 DIAGNOSIS — D696 Thrombocytopenia, unspecified: Secondary | ICD-10-CM

## 2016-09-18 NOTE — Telephone Encounter (Signed)
LMOM for a return call.  

## 2016-09-18 NOTE — Telephone Encounter (Signed)
Lab orders have been entered.

## 2016-09-19 NOTE — Telephone Encounter (Signed)
PT is aware and lab orders are being mailed to him.

## 2016-10-03 DIAGNOSIS — Z792 Long term (current) use of antibiotics: Secondary | ICD-10-CM | POA: Diagnosis not present

## 2016-10-03 DIAGNOSIS — G35 Multiple sclerosis: Secondary | ICD-10-CM | POA: Diagnosis not present

## 2016-10-03 DIAGNOSIS — B20 Human immunodeficiency virus [HIV] disease: Secondary | ICD-10-CM | POA: Diagnosis not present

## 2016-10-03 DIAGNOSIS — F418 Other specified anxiety disorders: Secondary | ICD-10-CM | POA: Diagnosis not present

## 2016-10-12 ENCOUNTER — Ambulatory Visit: Payer: Self-pay | Admitting: Radiation Oncology

## 2016-10-12 ENCOUNTER — Ambulatory Visit: Payer: Medicare Other | Admitting: Radiation Oncology

## 2016-10-25 ENCOUNTER — Other Ambulatory Visit: Payer: Self-pay | Admitting: Family Medicine

## 2016-10-30 ENCOUNTER — Encounter: Payer: Self-pay | Admitting: Family Medicine

## 2016-10-30 ENCOUNTER — Ambulatory Visit (INDEPENDENT_AMBULATORY_CARE_PROVIDER_SITE_OTHER): Payer: Medicare Other | Admitting: Family Medicine

## 2016-10-30 VITALS — BP 130/82 | Ht 68.0 in | Wt 219.0 lb

## 2016-10-30 DIAGNOSIS — E7849 Other hyperlipidemia: Secondary | ICD-10-CM

## 2016-10-30 DIAGNOSIS — I10 Essential (primary) hypertension: Secondary | ICD-10-CM | POA: Diagnosis not present

## 2016-10-30 DIAGNOSIS — K760 Fatty (change of) liver, not elsewhere classified: Secondary | ICD-10-CM | POA: Diagnosis not present

## 2016-10-30 DIAGNOSIS — E119 Type 2 diabetes mellitus without complications: Secondary | ICD-10-CM

## 2016-10-30 DIAGNOSIS — M2550 Pain in unspecified joint: Secondary | ICD-10-CM | POA: Diagnosis not present

## 2016-10-30 DIAGNOSIS — E784 Other hyperlipidemia: Secondary | ICD-10-CM | POA: Diagnosis not present

## 2016-10-30 DIAGNOSIS — M1A9XX0 Chronic gout, unspecified, without tophus (tophi): Secondary | ICD-10-CM

## 2016-10-30 MED ORDER — AMLODIPINE BESYLATE 10 MG PO TABS: ORAL_TABLET | ORAL | 6 refills | Status: DC

## 2016-10-30 MED ORDER — PRAVASTATIN SODIUM 40 MG PO TABS: ORAL_TABLET | ORAL | 5 refills | Status: DC

## 2016-10-30 MED ORDER — LISINOPRIL-HYDROCHLOROTHIAZIDE 10-12.5 MG PO TABS: ORAL_TABLET | ORAL | 5 refills | Status: DC

## 2016-10-30 MED ORDER — ALLOPURINOL 100 MG PO TABS: ORAL_TABLET | ORAL | 6 refills | Status: DC

## 2016-10-30 MED ORDER — OXYCODONE HCL 5 MG PO TABS: 5.0000 mg | ORAL_TABLET | Freq: Four times a day (QID) | ORAL | 0 refills | Status: DC | PRN

## 2016-10-30 NOTE — Progress Notes (Signed)
   Subjective:    Patient ID: Kerry Solis, male    DOB: 05/09/1946, 71 y.o.   MRN: [id number]  Hypertension  This is a chronic problem. The current episode started more than 1 year ago. Pertinent negatives include no chest pain, headaches or shortness of breath. Risk factors for coronary artery disease include dyslipidemia, male gender and sedentary lifestyle. Treatments tried: lisinopril/hctz.   Patient states he is in constant pain and is never pain free and would like to discuss getting some oxycodone to help control his pain. Patient also due his regular labs. Patient is trying to watch his diet is trying to stay somewhat active having a hard time keeping his weight under control Has not had any gout flareups recently He does get joint pains intermittently in multiple different areas He states oxycodone does help with his back pain and allows him to function better he denies abusing it only uses it a couple times a week at the most Has history of fatty liver See specialist for MS specialist for HIV  Review of Systems  Constitutional: Negative for activity change, fatigue and fever.  Respiratory: Negative for cough and shortness of breath.   Cardiovascular: Negative for chest pain and leg swelling.  Neurological: Negative for headaches.       Objective:   Physical Exam  Constitutional: He appears well-nourished. No distress.  Cardiovascular: Normal rate, regular rhythm and normal heart sounds.   No murmur heard. Pulmonary/Chest: Effort normal and breath sounds normal. No respiratory distress.  Musculoskeletal: He exhibits no edema.  Lymphadenopathy:    He has no cervical adenopathy.  Neurological: He is alert.  Psychiatric: His behavior is normal.  Vitals reviewed.         Assessment & Plan:  HTN-decent control continue current medicines Fatty liver will check liver profile. Watch diet try to lose weight Diabetes dietary control watch diet check A1c Hyperlipidemia  previous labs reviewed This ordered await the results continue medication Gout-no acute gout currently check uric acid level Arthralgia probably age-related I doubt due to pravastatin Chronic pain and discomfort patient at times has severe pain I recommend oxycodone when necessary 5 mg twice a day when necessary, prescription of 40 tablets was given patient feels this will last him for the next several months. He was warned about the addictive nature of this medicine he was also warned about eating in a safe place.  Patient will be undergoing radioactive seeds for prostate cancer

## 2016-10-31 LAB — MICROALBUMIN / CREATININE URINE RATIO
Creatinine, Urine: 151.3 mg/dL
Microalb/Creat Ratio: 72.2 mg/g creat — ABNORMAL HIGH (ref 0.0–30.0)
Microalbumin, Urine: 109.2 ug/mL

## 2016-10-31 LAB — BASIC METABOLIC PANEL
BUN/Creatinine Ratio: 12 (ref 10–24)
BUN: 16 mg/dL (ref 8–27)
CO2: 22 mmol/L (ref 18–29)
Calcium: 10.1 mg/dL (ref 8.6–10.2)
Chloride: 105 mmol/L (ref 96–106)
Creatinine, Ser: 1.31 mg/dL — ABNORMAL HIGH (ref 0.76–1.27)
GFR calc Af Amer: 62 mL/min/{1.73_m2} (ref >59)
GFR calc non Af Amer: 54 mL/min/{1.73_m2} — ABNORMAL LOW (ref >59)
Glucose: 130 mg/dL — ABNORMAL HIGH (ref 65–99)
Potassium: 4.2 mmol/L (ref 3.5–5.2)
Sodium: 143 mmol/L (ref 134–144)

## 2016-10-31 LAB — HEPATIC FUNCTION PANEL
ALT: 80 IU/L — ABNORMAL HIGH (ref 0–44)
AST: 87 IU/L — ABNORMAL HIGH (ref 0–40)
Albumin: 4.8 g/dL (ref 3.5–4.8)
Alkaline Phosphatase: 68 IU/L (ref 39–117)
Bilirubin Total: 0.3 mg/dL (ref 0.0–1.2)
Bilirubin, Direct: 0.15 mg/dL (ref 0.00–0.40)
Total Protein: 8.1 g/dL (ref 6.0–8.5)

## 2016-10-31 LAB — LIPID PANEL
Chol/HDL Ratio: 3.9 ratio (ref 0.0–5.0)
Cholesterol, Total: 120 mg/dL (ref 100–199)
HDL: 31 mg/dL — ABNORMAL LOW (ref >39)
LDL Calculated: 62 mg/dL (ref 0–99)
Triglycerides: 135 mg/dL (ref 0–149)
VLDL Cholesterol Cal: 27 mg/dL (ref 5–40)

## 2016-10-31 LAB — URIC ACID: Uric Acid: 8.2 mg/dL (ref 3.7–8.6)

## 2016-10-31 LAB — HEMOGLOBIN A1C
Est. average glucose Bld gHb Est-mCnc: 169 mg/dL
Hgb A1c MFr Bld: 7.5 % — ABNORMAL HIGH (ref 4.8–5.6)

## 2016-11-01 ENCOUNTER — Encounter (HOSPITAL_BASED_OUTPATIENT_CLINIC_OR_DEPARTMENT_OTHER): Payer: Self-pay | Admitting: *Deleted

## 2016-11-01 ENCOUNTER — Telehealth: Payer: Self-pay | Admitting: *Deleted

## 2016-11-01 NOTE — Telephone Encounter (Signed)
Called patient to remind of labs for implant on 11-02-16, spoke with patient and he is aware of this appt.

## 2016-11-01 NOTE — Progress Notes (Signed)
To [facility] at 1000-To have pre op labs drawn 11/05/2016.Ekg,Cxr in epic  -Instructed Npo after Mn-clear liquids( no dairy,pulp) until 0530-then Npo.To complete fleet enema prior to arrival.Norvasc in am with small amt water.

## 2016-11-05 DIAGNOSIS — Z7982 Long term (current) use of aspirin: Secondary | ICD-10-CM | POA: Diagnosis not present

## 2016-11-05 DIAGNOSIS — H409 Unspecified glaucoma: Secondary | ICD-10-CM | POA: Diagnosis not present

## 2016-11-05 DIAGNOSIS — C61 Malignant neoplasm of prostate: Secondary | ICD-10-CM | POA: Diagnosis not present

## 2016-11-05 DIAGNOSIS — Z5309 Procedure and treatment not carried out because of other contraindication: Secondary | ICD-10-CM | POA: Diagnosis not present

## 2016-11-05 DIAGNOSIS — B20 Human immunodeficiency virus [HIV] disease: Secondary | ICD-10-CM | POA: Diagnosis not present

## 2016-11-05 DIAGNOSIS — M109 Gout, unspecified: Secondary | ICD-10-CM | POA: Diagnosis not present

## 2016-11-05 DIAGNOSIS — I252 Old myocardial infarction: Secondary | ICD-10-CM | POA: Diagnosis not present

## 2016-11-05 DIAGNOSIS — F329 Major depressive disorder, single episode, unspecified: Secondary | ICD-10-CM | POA: Diagnosis not present

## 2016-11-05 DIAGNOSIS — I10 Essential (primary) hypertension: Secondary | ICD-10-CM | POA: Diagnosis not present

## 2016-11-05 DIAGNOSIS — Z79899 Other long term (current) drug therapy: Secondary | ICD-10-CM | POA: Diagnosis not present

## 2016-11-05 DIAGNOSIS — E781 Pure hyperglyceridemia: Secondary | ICD-10-CM | POA: Diagnosis not present

## 2016-11-05 DIAGNOSIS — Z91030 Bee allergy status: Secondary | ICD-10-CM | POA: Diagnosis not present

## 2016-11-05 DIAGNOSIS — G35 Multiple sclerosis: Secondary | ICD-10-CM | POA: Diagnosis not present

## 2016-11-05 LAB — APTT: aPTT: 29 seconds (ref 24–36)

## 2016-11-05 LAB — CBC
HCT: 43 % (ref 39.0–52.0)
Hemoglobin: 14 g/dL (ref 13.0–17.0)
MCH: 28.9 pg (ref 26.0–34.0)
MCHC: 32.6 g/dL (ref 30.0–36.0)
MCV: 88.7 fL (ref 78.0–100.0)
Platelets: 245 10*3/uL (ref 150–400)
RBC: 4.85 MIL/uL (ref 4.22–5.81)
RDW: 14.3 % (ref 11.5–15.5)
WBC: 3.6 10*3/uL — ABNORMAL LOW (ref 4.0–10.5)

## 2016-11-05 LAB — COMPREHENSIVE METABOLIC PANEL
ALT: 92 U/L — ABNORMAL HIGH (ref 17–63)
AST: 94 U/L — ABNORMAL HIGH (ref 15–41)
Albumin: 4.6 g/dL (ref 3.5–5.0)
Alkaline Phosphatase: 58 U/L (ref 38–126)
Anion gap: 9 (ref 5–15)
BUN: 16 mg/dL (ref 6–20)
CO2: 26 mmol/L (ref 22–32)
Calcium: 9.7 mg/dL (ref 8.9–10.3)
Chloride: 109 mmol/L (ref 101–111)
Creatinine, Ser: 1.44 mg/dL — ABNORMAL HIGH (ref 0.61–1.24)
GFR calc Af Amer: 55 mL/min — ABNORMAL LOW (ref >60)
GFR calc non Af Amer: 47 mL/min — ABNORMAL LOW (ref >60)
Glucose, Bld: 133 mg/dL — ABNORMAL HIGH (ref 65–99)
Potassium: 4.5 mmol/L (ref 3.5–5.1)
Sodium: 144 mmol/L (ref 135–145)
Total Bilirubin: 0.6 mg/dL (ref 0.3–1.2)
Total Protein: 8.5 g/dL — ABNORMAL HIGH (ref 6.5–8.1)

## 2016-11-05 LAB — PROTIME-INR
INR: 1
Prothrombin Time: 13.2 seconds (ref 11.4–15.2)

## 2016-11-08 ENCOUNTER — Telehealth: Payer: Self-pay | Admitting: *Deleted

## 2016-11-08 NOTE — Telephone Encounter (Signed)
Called patient to remind of his implant for 11-09-16, no answer will call later.

## 2016-11-09 ENCOUNTER — Ambulatory Visit (HOSPITAL_BASED_OUTPATIENT_CLINIC_OR_DEPARTMENT_OTHER)
Admission: RE | Admit: 2016-11-09 | Discharge: 2016-11-09 | Disposition: A | Discharge status: Home / Self Care | Payer: Medicare Other | Source: Ambulatory Visit | Attending: Urology | Admitting: Urology

## 2016-11-09 ENCOUNTER — Encounter (HOSPITAL_BASED_OUTPATIENT_CLINIC_OR_DEPARTMENT_OTHER): Payer: Self-pay | Admitting: Anesthesiology

## 2016-11-09 ENCOUNTER — Encounter (HOSPITAL_BASED_OUTPATIENT_CLINIC_OR_DEPARTMENT_OTHER)
Admission: RE | Disposition: A | Discharge status: Home / Self Care | Payer: Self-pay | Source: Ambulatory Visit | Attending: Urology

## 2016-11-09 DIAGNOSIS — H409 Unspecified glaucoma: Secondary | ICD-10-CM | POA: Insufficient documentation

## 2016-11-09 DIAGNOSIS — I10 Essential (primary) hypertension: Secondary | ICD-10-CM | POA: Insufficient documentation

## 2016-11-09 DIAGNOSIS — E781 Pure hyperglyceridemia: Secondary | ICD-10-CM | POA: Insufficient documentation

## 2016-11-09 DIAGNOSIS — G35 Multiple sclerosis: Secondary | ICD-10-CM | POA: Insufficient documentation

## 2016-11-09 DIAGNOSIS — Z01818 Encounter for other preprocedural examination: Secondary | ICD-10-CM

## 2016-11-09 DIAGNOSIS — I252 Old myocardial infarction: Secondary | ICD-10-CM | POA: Insufficient documentation

## 2016-11-09 DIAGNOSIS — Z91030 Bee allergy status: Secondary | ICD-10-CM | POA: Insufficient documentation

## 2016-11-09 DIAGNOSIS — Z5309 Procedure and treatment not carried out because of other contraindication: Secondary | ICD-10-CM | POA: Insufficient documentation

## 2016-11-09 DIAGNOSIS — F329 Major depressive disorder, single episode, unspecified: Secondary | ICD-10-CM | POA: Insufficient documentation

## 2016-11-09 DIAGNOSIS — Z7982 Long term (current) use of aspirin: Secondary | ICD-10-CM | POA: Insufficient documentation

## 2016-11-09 DIAGNOSIS — B20 Human immunodeficiency virus [HIV] disease: Secondary | ICD-10-CM | POA: Insufficient documentation

## 2016-11-09 DIAGNOSIS — M109 Gout, unspecified: Secondary | ICD-10-CM | POA: Insufficient documentation

## 2016-11-09 DIAGNOSIS — Z79899 Other long term (current) drug therapy: Secondary | ICD-10-CM | POA: Insufficient documentation

## 2016-11-09 DIAGNOSIS — C61 Malignant neoplasm of prostate: Secondary | ICD-10-CM | POA: Insufficient documentation

## 2016-11-09 SURGERY — INSERTION, RADIATION SOURCE, PROSTATE
Anesthesia: Choice

## 2016-11-09 SURGICAL SUPPLY — 25 items
BAG URINE DRAINAGE (UROLOGICAL SUPPLIES) ×1 IMPLANT
BLADE CLIPPER SURG (BLADE) ×1 IMPLANT
CATH FOLEY 2WAY SLVR  5CC 16FR (CATHETERS)
CATH FOLEY 2WAY SLVR 5CC 16FR (CATHETERS) ×1 IMPLANT
CATH ROBINSON RED A/P 20FR (CATHETERS) ×1 IMPLANT
CLOTH BEACON ORANGE TIMEOUT ST (SAFETY) ×1 IMPLANT
COVER BACK TABLE 60X90IN (DRAPES) ×1 IMPLANT
COVER MAYO STAND STRL (DRAPES) ×1 IMPLANT
DRSG TEGADERM 4X4.75 (GAUZE/BANDAGES/DRESSINGS) ×1 IMPLANT
DRSG TEGADERM 8X12 (GAUZE/BANDAGES/DRESSINGS) ×1 IMPLANT
GLOVE BIO SURGEON STRL SZ8 (GLOVE) ×2 IMPLANT
GLOVE ECLIPSE 8.0 STRL XLNG CF (GLOVE) IMPLANT
GOWN STRL REUS W/ TWL LRG LVL3 (GOWN DISPOSABLE) ×1 IMPLANT
GOWN STRL REUS W/ TWL XL LVL3 (GOWN DISPOSABLE) ×1 IMPLANT
GOWN STRL REUS W/TWL LRG LVL3 (GOWN DISPOSABLE)
GOWN STRL REUS W/TWL XL LVL3 (GOWN DISPOSABLE)
HOLDER FOLEY CATH W/STRAP (MISCELLANEOUS) ×1 IMPLANT
IV NS 1000ML (IV SOLUTION)
IV NS 1000ML BAXH (IV SOLUTION) ×1 IMPLANT
KIT RM TURNOVER CYSTO AR (KITS) ×1 IMPLANT
PACK CYSTO (CUSTOM PROCEDURE TRAY) ×1 IMPLANT
SYRINGE 10CC LL (SYRINGE) ×1 IMPLANT
UNDERPAD 30X30 INCONTINENT (UNDERPADS AND DIAPERS) ×2 IMPLANT
WATER STERILE IRR 3000ML UROMA (IV SOLUTION) ×1 IMPLANT
WATER STERILE IRR 500ML POUR (IV SOLUTION) ×1 IMPLANT

## 2016-11-09 NOTE — Progress Notes (Signed)
  Radiation Oncology         [phone] ________________________________  Name: Kerry Solis MRN: [id number]  Date: 11/09/2016  DOB: [birth year]       Prostate Seed Implant   Case rescheduled.  ________________________________  Sheral Apley Tammi Klippel, M.D.

## 2016-11-09 NOTE — H&P (Signed)
H&P  Chief Complaint: Prostate cancer  History of Present Illness: 71 year old man presents for I-125 brachytherapy.  He underwent an ultrasound and biopsy of his prostate on 06/19/2016. At that time, PSA was 4.1. He had an apical nodule which had been biopsied years before andwas benign. 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 2 cores revealed Gleason 3+4 = 6 disease, 10 and 20%involvement, respectively. Prostatic volume was 27 cc. PSA density 0.15.   IPSS 15  SHIM SCORE 17   He was referred for XRT consult with Dr. Tammi Klippel and has chosen brachytherapy.  Past Medical History:  Diagnosis Date  . Anxiety   . Arthritis   . Cryptococcal meningitis ([place])    greater than 15 years ago  . Depression   . Elevated liver enzymes   . Glaucoma   . High triglycerides   . History of gout   . HIV (human immunodeficiency virus infection) ([place])   . Hypertension   . Leukopenia    Low CD4  . Multiple sclerosis ([place])    uses a cane  . Myocardial infarction ([place])    45 years ago  . Prostate cancer [facility])     Past Surgical History:  Procedure Laterality Date  . BIOPSY  08/14/2016   Procedure: BIOPSY;  Surgeon: Danie Binder, MD;  Location: [facility];  Service: Endoscopy;;  gastric bx's  . CHOLECYSTECTOMY  06/10/2011   Procedure: LAPAROSCOPIC CHOLECYSTECTOMY;  Surgeon: Donato Heinz, MD;  Location: [facility] ORS;  Service: General;  Laterality: N/A;  . COLONOSCOPY  03/10/2009   JOI:NOMVEHMC internal hemorrhoids/6-mm sessile ascending colon polyp/tortuous colon, diverticulosis. TA  . COLONOSCOPY WITH PROPOFOL N/A 04/19/2015   Dr. Oneida Alar: sessile serrated adenoma, surveillance in 5 years   . ESOPHAGOGASTRODUODENOSCOPY  06/23/2008   NOB:SJGGEZM gastritis/schatzki ring  . ESOPHAGOGASTRODUODENOSCOPY (EGD) WITH PROPOFOL N/A 08/14/2016   Procedure: ESOPHAGOGASTRODUODENOSCOPY (EGD) WITH PROPOFOL;  Surgeon: Danie Binder, MD;   Location: [facility];  Service: Endoscopy;  Laterality: N/A;  7:30am  . NECK SURGERY  unsure   disc  . POLYPECTOMY N/A 04/19/2015   Procedure: POLYPECTOMY;  Surgeon: Danie Binder, MD;  Location: [facility] ORS;  Service: Endoscopy;  Laterality: N/A;  ascending colon  . PROSTATE BIOPSY    . SAVORY DILATION N/A 08/14/2016   Procedure: SAVORY DILATION;  Surgeon: Danie Binder, MD;  Location: [facility];  Service: Endoscopy;  Laterality: N/A;    Home Medications:  Allergies as of 11/09/2016      Reactions   Yellow Jacket Venom Swelling      Medication List    Notice   Cannot display discharge medications because the patient has not yet been admitted.     Allergies:  Allergies  Allergen Reactions  . Yellow Jacket Venom Swelling    Family History  Problem Relation Age of Onset  . Hypertension Mother   . Heart attack Mother   . Heart attack Father   . Cancer Sister        Breast  . Diabetes Sister   . Diabetes Brother   . Cancer Brother        unknown  . Cancer Brother        colon  . Cancer Sister        breast  . Cancer Brother        prostate  . Cancer Brother        colon  Social History:  reports that he has never smoked. He has never used smokeless tobacco. He reports that he does not drink alcohol or use drugs.  ROS: A complete review of systems was performed.  All systems are negative except for pertinent findings as noted.  Physical Exam:  Vital signs in last 24 hours:   Constitutional:  Alert and oriented, No acute distress Cardiovascular: Regular rate and rhythm, No JVD Respiratory: Normal respiratory effort, Lungs clear bilaterally GI: Abdomen is soft, nontender, nondistended, no abdominal masses Genitourinary: No CVAT. Normal male phallus, testes are descended bilaterally and non-tender and without masses, scrotum is normal in appearance without lesions or masses, perineum is normal on inspection. Rectal: Normal sphincter tone, no rectal masses,  prostate is non tender and without nodularity. Prostate size is estimated to be 40 cc with a rubbery left sided nodule. Lymphatic: No lymphadenopathy Neurologic: Grossly intact, no focal deficits Psychiatric: Normal mood and affect  Laboratory Data:  No results for input(s): WBC, HGB, HCT, PLT in the last 72 hours.  No results for input(s): NA, K, CL, GLUCOSE, BUN, CALCIUM, CREATININE in the last 72 hours.  Invalid input(s): CO3   No results found for this or any previous visit (from the past 24 hour(s)). No results found for this or any previous visit (from the past 240 hour(s)).  Renal Function:  Recent Labs  11/05/16 0952  CREATININE 1.44*   Estimated Creatinine Clearance: 53 mL/min (A) (by C-G formula based on SCr of 1.44 mg/dL (H)).  Radiologic Imaging: No results found.  Impression/Assessment:  Prostate cancer  Plan:  I-125 brachytherapy. Case postponed de to pt not being NPO

## 2016-11-14 ENCOUNTER — Other Ambulatory Visit: Payer: Self-pay | Admitting: Urology

## 2016-11-14 ENCOUNTER — Telehealth: Payer: Self-pay | Admitting: *Deleted

## 2016-11-14 NOTE — Telephone Encounter (Signed)
Called patient to inform of implant being rescheduled for 11-29-16, spoke with patient and he is aware of this implant date

## 2016-11-19 ENCOUNTER — Encounter (HOSPITAL_BASED_OUTPATIENT_CLINIC_OR_DEPARTMENT_OTHER): Payer: Self-pay | Admitting: *Deleted

## 2016-11-19 NOTE — Progress Notes (Addendum)
Spoke with daughter in law Kerry Solis.To arrive at 0600-Npo after Mn-take protonix,norvasc with small amt water.To complete fleet enema prior to arrival.Cxr,EKG in epic.Will confirm with Dr Diona Fanti if repeat lab work is necessary.

## 2016-11-20 NOTE — Progress Notes (Signed)
RECEIVED CALL BACK FROM DR Diona Fanti  OFFCIE, SELITA (OR SCHEDULER).  DR Diona Fanti STATED LABS DO NOT NEED TO BE REPEATED , USE WHAT IS ALREADY DONE.

## 2016-11-21 ENCOUNTER — Telehealth: Payer: Self-pay | Admitting: *Deleted

## 2016-11-21 NOTE — Telephone Encounter (Signed)
CALLED PATIENT TO INFORM THAT HE DOESN'T HAVE TO LABS DRAWN ON 11-22-16, (THE UROLOGIST HAS AGREED TO USE LABS FROM THE OTHER WK.), SPOKE WITH PATIENT AND HE IS AWARE OF THIS

## 2016-11-23 ENCOUNTER — Other Ambulatory Visit: Payer: Self-pay | Admitting: Family Medicine

## 2016-11-28 ENCOUNTER — Telehealth: Payer: Self-pay | Admitting: *Deleted

## 2016-11-28 DIAGNOSIS — C61 Malignant neoplasm of prostate: Secondary | ICD-10-CM | POA: Diagnosis not present

## 2016-11-28 NOTE — Telephone Encounter (Signed)
CALLED PATIENT TO REMIND OF PROCEDURE FOR 11-29-16, NO ANSWER WILL CALL LATER

## 2016-11-29 ENCOUNTER — Ambulatory Visit (HOSPITAL_COMMUNITY): Payer: Medicare Other

## 2016-11-29 ENCOUNTER — Ambulatory Visit (HOSPITAL_BASED_OUTPATIENT_CLINIC_OR_DEPARTMENT_OTHER)
Admission: RE | Admit: 2016-11-29 | Discharge: 2016-11-29 | Disposition: A | Discharge status: Home / Self Care | Payer: Medicare Other | Source: Ambulatory Visit | Attending: Urology | Admitting: Urology

## 2016-11-29 ENCOUNTER — Encounter (HOSPITAL_BASED_OUTPATIENT_CLINIC_OR_DEPARTMENT_OTHER): Payer: Self-pay | Admitting: *Deleted

## 2016-11-29 ENCOUNTER — Encounter (HOSPITAL_BASED_OUTPATIENT_CLINIC_OR_DEPARTMENT_OTHER)
Admission: RE | Disposition: A | Discharge status: Home / Self Care | Payer: Self-pay | Source: Ambulatory Visit | Attending: Urology

## 2016-11-29 ENCOUNTER — Ambulatory Visit (HOSPITAL_BASED_OUTPATIENT_CLINIC_OR_DEPARTMENT_OTHER): Payer: Medicare Other | Admitting: Anesthesiology

## 2016-11-29 DIAGNOSIS — Z91030 Bee allergy status: Secondary | ICD-10-CM | POA: Diagnosis not present

## 2016-11-29 DIAGNOSIS — H409 Unspecified glaucoma: Secondary | ICD-10-CM | POA: Insufficient documentation

## 2016-11-29 DIAGNOSIS — I252 Old myocardial infarction: Secondary | ICD-10-CM | POA: Diagnosis not present

## 2016-11-29 DIAGNOSIS — B20 Human immunodeficiency virus [HIV] disease: Secondary | ICD-10-CM | POA: Diagnosis not present

## 2016-11-29 DIAGNOSIS — C61 Malignant neoplasm of prostate: Secondary | ICD-10-CM | POA: Insufficient documentation

## 2016-11-29 DIAGNOSIS — G35 Multiple sclerosis: Secondary | ICD-10-CM | POA: Insufficient documentation

## 2016-11-29 DIAGNOSIS — E781 Pure hyperglyceridemia: Secondary | ICD-10-CM | POA: Diagnosis not present

## 2016-11-29 DIAGNOSIS — I10 Essential (primary) hypertension: Secondary | ICD-10-CM | POA: Insufficient documentation

## 2016-11-29 DIAGNOSIS — M109 Gout, unspecified: Secondary | ICD-10-CM | POA: Diagnosis not present

## 2016-11-29 DIAGNOSIS — Z79899 Other long term (current) drug therapy: Secondary | ICD-10-CM | POA: Insufficient documentation

## 2016-11-29 DIAGNOSIS — K625 Hemorrhage of anus and rectum: Secondary | ICD-10-CM | POA: Diagnosis not present

## 2016-11-29 DIAGNOSIS — Z7982 Long term (current) use of aspirin: Secondary | ICD-10-CM | POA: Insufficient documentation

## 2016-11-29 DIAGNOSIS — F329 Major depressive disorder, single episode, unspecified: Secondary | ICD-10-CM | POA: Insufficient documentation

## 2016-11-29 DIAGNOSIS — E785 Hyperlipidemia, unspecified: Secondary | ICD-10-CM | POA: Diagnosis not present

## 2016-11-29 HISTORY — PX: RADIOACTIVE SEED IMPLANT: SHX5150

## 2016-11-29 SURGERY — INSERTION, RADIATION SOURCE, PROSTATE
Anesthesia: General | Site: Prostate

## 2016-11-29 MED ORDER — PROPOFOL 10 MG/ML IV BOLUS
INTRAVENOUS | Status: DC | PRN
  Administered 2016-11-29: 200 mg via INTRAVENOUS
  Administered 2016-11-29: 50 mg via INTRAVENOUS
  Administered 2016-11-29: 30 mg via INTRAVENOUS
  Administered 2016-11-29: 20 mg via INTRAVENOUS

## 2016-11-29 MED ORDER — TRAMADOL HCL 50 MG PO TABS
ORAL_TABLET | ORAL | Status: AC
  Filled 2016-11-29: qty 1

## 2016-11-29 MED ORDER — PHENYLEPHRINE 40 MCG/ML (10ML) SYRINGE FOR IV PUSH (FOR BLOOD PRESSURE SUPPORT)
PREFILLED_SYRINGE | INTRAVENOUS | Status: DC | PRN
  Administered 2016-11-29 (×4): 80 ug via INTRAVENOUS

## 2016-11-29 MED ORDER — PHENYLEPHRINE 40 MCG/ML (10ML) SYRINGE FOR IV PUSH (FOR BLOOD PRESSURE SUPPORT)
PREFILLED_SYRINGE | INTRAVENOUS | Status: AC
  Filled 2016-11-29: qty 10

## 2016-11-29 MED ORDER — DEXAMETHASONE SODIUM PHOSPHATE 4 MG/ML IJ SOLN
INTRAMUSCULAR | Status: DC | PRN
  Administered 2016-11-29: 10 mg via INTRAVENOUS

## 2016-11-29 MED ORDER — CEFAZOLIN SODIUM-DEXTROSE 1-4 GM/50ML-% IV SOLN
1.0000 g | Freq: Once | INTRAVENOUS | Status: AC
  Administered 2016-11-29: 2 g via INTRAVENOUS
  Filled 2016-11-29: qty 50

## 2016-11-29 MED ORDER — PROMETHAZINE HCL 25 MG/ML IJ SOLN
6.2500 mg | INTRAMUSCULAR | Status: DC | PRN
  Filled 2016-11-29: qty 1

## 2016-11-29 MED ORDER — CEFAZOLIN SODIUM-DEXTROSE 2-4 GM/100ML-% IV SOLN
INTRAVENOUS | Status: AC
  Filled 2016-11-29: qty 100

## 2016-11-29 MED ORDER — SUCCINYLCHOLINE CHLORIDE 200 MG/10ML IV SOSY
PREFILLED_SYRINGE | INTRAVENOUS | Status: DC | PRN
  Administered 2016-11-29: 120 mg via INTRAVENOUS

## 2016-11-29 MED ORDER — TRAMADOL HCL 50 MG PO TABS
50.0000 mg | ORAL_TABLET | Freq: Four times a day (QID) | ORAL | Status: DC | PRN
  Administered 2016-11-29: 50 mg via ORAL
  Filled 2016-11-29: qty 1

## 2016-11-29 MED ORDER — LIDOCAINE 2% (20 MG/ML) 5 ML SYRINGE
INTRAMUSCULAR | Status: AC
  Filled 2016-11-29: qty 5

## 2016-11-29 MED ORDER — DEXAMETHASONE SODIUM PHOSPHATE 10 MG/ML IJ SOLN
INTRAMUSCULAR | Status: AC
  Filled 2016-11-29: qty 1

## 2016-11-29 MED ORDER — FENTANYL CITRATE (PF) 100 MCG/2ML IJ SOLN
INTRAMUSCULAR | Status: AC
  Filled 2016-11-29: qty 2

## 2016-11-29 MED ORDER — ONDANSETRON HCL 4 MG/2ML IJ SOLN
INTRAMUSCULAR | Status: AC
  Filled 2016-11-29: qty 2

## 2016-11-29 MED ORDER — DEXTROSE 5 % IV SOLN
INTRAVENOUS | Status: DC | PRN
  Administered 2016-11-29: 100 ug/min via INTRAVENOUS

## 2016-11-29 MED ORDER — LACTATED RINGERS IV SOLN
INTRAVENOUS | Status: DC
  Administered 2016-11-29: 07:00:00 via INTRAVENOUS
  Filled 2016-11-29: qty 1000

## 2016-11-29 MED ORDER — ONDANSETRON HCL 4 MG/2ML IJ SOLN
INTRAMUSCULAR | Status: DC | PRN
  Administered 2016-11-29: 4 mg via INTRAVENOUS

## 2016-11-29 MED ORDER — IOHEXOL 300 MG/ML  SOLN
INTRAMUSCULAR | Status: DC | PRN
  Administered 2016-11-29: 7 mL via INTRAVENOUS

## 2016-11-29 MED ORDER — FLEET ENEMA 7-19 GM/118ML RE ENEM
1.0000 | ENEMA | Freq: Once | RECTAL | Status: DC
  Filled 2016-11-29: qty 1

## 2016-11-29 MED ORDER — TRAMADOL HCL 50 MG PO TABS: 50.0000 mg | ORAL_TABLET | Freq: Four times a day (QID) | ORAL | 0 refills | Status: DC | PRN

## 2016-11-29 MED ORDER — PHENYLEPHRINE HCL 10 MG/ML IJ SOLN
INTRAMUSCULAR | Status: AC
  Filled 2016-11-29: qty 1

## 2016-11-29 MED ORDER — FENTANYL CITRATE (PF) 100 MCG/2ML IJ SOLN
INTRAMUSCULAR | Status: DC | PRN
  Administered 2016-11-29: 50 ug via INTRAVENOUS
  Administered 2016-11-29 (×2): 25 ug via INTRAVENOUS
  Administered 2016-11-29: 50 ug via INTRAVENOUS

## 2016-11-29 MED ORDER — LIDOCAINE 2% (20 MG/ML) 5 ML SYRINGE
INTRAMUSCULAR | Status: DC | PRN
  Administered 2016-11-29: 100 mg via INTRAVENOUS

## 2016-11-29 MED ORDER — PROPOFOL 10 MG/ML IV BOLUS
INTRAVENOUS | Status: AC
  Filled 2016-11-29: qty 40

## 2016-11-29 MED ORDER — STERILE WATER FOR IRRIGATION IR SOLN
Status: DC | PRN
  Administered 2016-11-29: 3000 mL via INTRAVESICAL

## 2016-11-29 MED ORDER — HYDROMORPHONE HCL 1 MG/ML IJ SOLN
0.2500 mg | INTRAMUSCULAR | Status: DC | PRN
  Filled 2016-11-29: qty 0.5

## 2016-11-29 MED ORDER — SUCCINYLCHOLINE CHLORIDE 200 MG/10ML IV SOSY
PREFILLED_SYRINGE | INTRAVENOUS | Status: AC
  Filled 2016-11-29: qty 10

## 2016-11-29 SURGICAL SUPPLY — 27 items
BAG URINE DRAINAGE (UROLOGICAL SUPPLIES) ×2 IMPLANT
BLADE CLIPPER SURG (BLADE) ×2 IMPLANT
CATH FOLEY 2WAY SLVR  5CC 16FR (CATHETERS) ×1
CATH FOLEY 2WAY SLVR 5CC 16FR (CATHETERS) ×1 IMPLANT
CATH ROBINSON RED A/P 20FR (CATHETERS) ×2 IMPLANT
CLOTH BEACON ORANGE TIMEOUT ST (SAFETY) ×2 IMPLANT
COVER BACK TABLE 60X90IN (DRAPES) ×2 IMPLANT
COVER MAYO STAND STRL (DRAPES) ×2 IMPLANT
DRSG TEGADERM 4X4.75 (GAUZE/BANDAGES/DRESSINGS) ×2 IMPLANT
DRSG TEGADERM 8X12 (GAUZE/BANDAGES/DRESSINGS) ×2 IMPLANT
GAUZE SPONGE 4X4 12PLY STRL LF (GAUZE/BANDAGES/DRESSINGS) ×1 IMPLANT
GLOVE BIO SURGEON STRL SZ8 (GLOVE) ×4 IMPLANT
GLOVE ECLIPSE 8.0 STRL XLNG CF (GLOVE) IMPLANT
GOWN STRL REUS W/ TWL LRG LVL3 (GOWN DISPOSABLE) ×1 IMPLANT
GOWN STRL REUS W/ TWL XL LVL3 (GOWN DISPOSABLE) ×1 IMPLANT
GOWN STRL REUS W/TWL LRG LVL3 (GOWN DISPOSABLE) ×2
GOWN STRL REUS W/TWL XL LVL3 (GOWN DISPOSABLE) ×2
HOLDER FOLEY CATH W/STRAP (MISCELLANEOUS) ×2 IMPLANT
IV NS 1000ML (IV SOLUTION) ×2
IV NS 1000ML BAXH (IV SOLUTION) ×1 IMPLANT
KIT RM TURNOVER CYSTO AR (KITS) ×2 IMPLANT
PACK CYSTO (CUSTOM PROCEDURE TRAY) ×2 IMPLANT
SYRINGE 10CC LL (SYRINGE) ×2 IMPLANT
UNDERPAD 30X30 INCONTINENT (UNDERPADS AND DIAPERS) ×4 IMPLANT
WATER STERILE IRR 3000ML UROMA (IV SOLUTION) ×2 IMPLANT
WATER STERILE IRR 500ML POUR (IV SOLUTION) ×2 IMPLANT
selectSeed 1-125 ×1 IMPLANT

## 2016-11-29 NOTE — H&P (Signed)
Chief Complaint: Prostate cancer  History of Present Illness: 71 year old man presents for I-125 brachytherapy.  He underwent an ultrasound and biopsy of his prostate on 06/19/2016. At that time, PSA was 4.1. He had an apical nodule which had been biopsied years before andwas benign. 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 2 cores revealed Gleason 3+4 = 6 disease, 10 and 20%involvement, respectively. Prostatic volume was 27 cc. PSA density 0.15.   IPSS 15  SHIM SCORE 17   He was referred for XRT consult with Dr. Tammi Klippel and has chosen brachytherapy.  This case has been rescheduled from 2 weeks ago, as the pt had breakfast before his last procedure.      Past Medical History:  Diagnosis Date  . Anxiety   . Arthritis   . 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 Depression   . Elevated liver enzymes   . Glaucoma   . High triglycerides   . History of gout   . HIV (human immunodeficiency virus infection) ([place])   . Hypertension   . Leukopenia    Low CD4  . Multiple sclerosis ([name])    uses a cane  . Myocardial infarction ([place])    45 years ago  . Prostate cancer [facility])          Past Surgical History:  Procedure Laterality Date  . BIOPSY  08/14/2016   Procedure: BIOPSY;  Surgeon: Danie Binder, MD;  Location: [facility];  Service: Endoscopy;;  gastric bx's  . CHOLECYSTECTOMY  06/10/2011   Procedure: LAPAROSCOPIC CHOLECYSTECTOMY;  Surgeon: Donato Heinz, MD;  Location: [facility] ORS;  Service: General;  Laterality: N/A;  . COLONOSCOPY  03/10/2009   GQQ:PYPPJKDT internal hemorrhoids/6-mm sessile ascending colon polyp/tortuous colon, diverticulosis. TA  . COLONOSCOPY WITH PROPOFOL N/A 04/19/2015   Dr. Oneida Alar: sessile serrated adenoma, surveillance in 5 years   . ESOPHAGOGASTRODUODENOSCOPY  06/23/2008   OIZ:TIWPYKD gastritis/schatzki ring  .  ESOPHAGOGASTRODUODENOSCOPY (EGD) WITH PROPOFOL N/A 08/14/2016   Procedure: ESOPHAGOGASTRODUODENOSCOPY (EGD) WITH PROPOFOL;  Surgeon: Danie Binder, MD;  Location: [facility];  Service: Endoscopy;  Laterality: N/A;  7:30am  . NECK SURGERY  unsure   disc  . POLYPECTOMY N/A 04/19/2015   Procedure: POLYPECTOMY;  Surgeon: Danie Binder, MD;  Location: [facility];  Service: Endoscopy;  Laterality: N/A;  ascending colon  . PROSTATE BIOPSY    . SAVORY DILATION N/A 08/14/2016   Procedure: SAVORY DILATION;  Surgeon: Danie Binder, MD;  Location: [facility];  Service: Endoscopy;  Laterality: N/A;    Home Medications:      Allergies as of 11/09/2016      Reactions   Yellow Jacket Venom Swelling      Medication List    Notice   Cannot display discharge medications because the patient has not yet been admitted.     Allergies:      Allergies  Allergen Reactions  . Yellow Jacket Venom Swelling         Family History  Problem Relation Age of Onset  . Hypertension Mother   . Heart attack Mother   . Heart attack Father   . Cancer Sister        Breast  . Diabetes Sister   . Diabetes Brother   . Cancer Brother        unknown  . Cancer Brother  colon  . Cancer Sister        breast  . Cancer Brother        prostate  . Cancer Brother        colon    Social History:  reports that he has never smoked. He has never used smokeless tobacco. He reports that he does not drink alcohol or use drugs.  ROS: A complete review of systems was performed.  All systems are negative except for pertinent findings as noted.  Physical Exam:  Vital signs in last 24 hours: Constitutional:  Alert and oriented, No acute distress Cardiovascular: Regular rate and rhythm, No JVD Respiratory: Normal respiratory effort, Lungs clear bilaterally GI: Abdomen is soft, nontender, nondistended, no abdominal masses Genitourinary: No CVAT. Normal male phallus, testes  are descended bilaterally and non-tender and without masses, scrotum is normal in appearance without lesions or masses, perineum is normal on inspection. Rectal: Normal sphincter tone, no rectal masses, prostate is non tender and without nodularity. Prostate size is estimated to be 40 cc with a rubbery left sided nodule. Lymphatic: No lymphadenopathy Neurologic: Grossly intact, no focal deficits Psychiatric: Normal mood and affect  Laboratory Data:  Recent Labs (last 2 labs)   No results for input(s): WBC, HGB, HCT, PLT in the last 72 hours.     Recent Labs (last 2 labs)   No results for input(s): NA, K, CL, GLUCOSE, BUN, CALCIUM, CREATININE in the last 72 hours.  Invalid input(s): CO3     Lab Results Last 24 Hours  No results found for this or any previous visit (from the past 24 hour(s)).   No results found for this or any previous visit (from the past 240 hour(s)).  Renal Function:  Recent Labs  11/05/16 0952  CREATININE 1.44*   Estimated Creatinine Clearance: 53 mL/min (A) (by C-G formula based on SCr of 1.44 mg/dL (H)).  Radiologic Imaging: Imaging Results (Last 48 hours)  No results found.    Impression/Assessment:  Prostate cancer  Plan:  I-125 brachytherapy.

## 2016-11-29 NOTE — Anesthesia Procedure Notes (Addendum)
Procedure Name: LMA Insertion Date/Time: 11/29/2016 7:45 AM Performed by: Rica Koyanagi Pre-anesthesia Checklist: Patient identified, Emergency Drugs available, Suction available and Patient being monitored Patient Re-evaluated:Patient Re-evaluated prior to inductionOxygen Delivery Method: Circle system utilized Preoxygenation: Pre-oxygenation with 100% oxygen Intubation Type: IV induction Ventilation: Mask ventilation without difficulty LMA: LMA inserted LMA Size: 5.0 Number of attempts: 1 Airway Equipment and Method: Bite block Placement Confirmation: positive ETCO2 Tube secured with: Tape Dental Injury: Teeth and Oropharynx as per pre-operative assessment  Comments: LMA not seated well, attempted to reposition without good result.  Dr. Orene Desanctis notified. LMA removed, IV anectine given.  DL with MAC 4 blade. GR I view of VC's. AOI with 8.0 OETT. Secured with tape at 22 cm at lip. Bite block placed. VSS throughout. See following airway/procedure note.

## 2016-11-29 NOTE — Anesthesia Postprocedure Evaluation (Signed)
Anesthesia Post Note  Patient: Kerry Solis  Procedure(s) Performed: Procedure(s) (LRB): RADIOACTIVE SEED IMPLANT/BRACHYTHERAPY IMPLANT (N/A)     Patient location during evaluation: PACU Anesthesia Type: General Level of consciousness: awake and alert Pain management: pain level controlled Vital Signs Assessment: post-procedure vital signs reviewed and stable Respiratory status: spontaneous breathing, nonlabored ventilation, respiratory function stable and patient connected to nasal cannula oxygen Cardiovascular status: blood pressure returned to baseline and stable Postop Assessment: no signs of nausea or vomiting Anesthetic complications: no    Last Vitals:  Vitals:   11/29/16 1015 11/29/16 1110  BP: 121/63 (!) 153/65  Pulse: 78 81  Resp: 12 14  Temp:  36.9 C    Last Pain:  Vitals:   11/29/16 0602  TempSrc: Oral                 Anuradha Chabot,JAMES TERRILL

## 2016-11-29 NOTE — Op Note (Signed)
Preoperative diagnosis: Clinical stage TI C adenocarcinoma the prostate   Postoperative diagnosis: Same   Procedure: I-125 prostate seed implantation with Nucletron robotic implanter , flexible cystoscopy  Surgeon: Lillette Boxer. Laurina Fischl M.D.  Radiation Oncologist: Tammi Klippel  Anesthesia: Gen.   Indications: Patient  was diagnosed with clinical stage TI C prostate cancer. We had extensive discussion with him about treatment options versus. He elected to proceed with seed implantation. He underwent consultation my office as well as with Dr. Tammi Klippel. He appeared to understand the advantages disadvantages potential risks of this treatment option. Full informed consent has been obtained.   Technique and findings: Patient was brought the operating room where he had successful induction of general anesthesia. He was placed in dorso-lithotomy position and prepped and draped in usual manner. Appropriate surgical timeout was performed. Radiation oncology department placed a transrectal ultrasound probe anchoring stand. Foley catheter with contrast in the balloon was inserted without difficulty. Anchoring needles were placed within the prostate. Rectal tube was placed. Real-time contouring of the urethra prostate and rectum were performed and the dosing parameters were established. Targeted dose was 145 gray.  I was then called  to the operating suite suite for placement of the needles. A second timeout was performed. All needle passage was done with real-time transrectal ultrasound guidance with the sagittal plane. A total of 21 needles were placed. The implantation itself was done with the robotic implanter. 81 active seeds were implanted. The Foley catheter was removed and flexible cystoscopy failed to show any seeds outside the prostate.  The patient was brought to recovery room in stable condition, having tolerated the procedure well.Marland Kitchen

## 2016-11-29 NOTE — Interval H&P Note (Signed)
History and Physical Interval Note:  11/29/2016 7:32 AM  Kerry Solis  has presented today for surgery, with the diagnosis of PROSTATE CANCER  The various methods of treatment have been discussed with the patient and family. After consideration of risks, benefits and other options for treatment, the patient has consented to  Procedure(s): RADIOACTIVE SEED IMPLANT/BRACHYTHERAPY IMPLANT (N/A) as a surgical intervention .  The patient's history has been reviewed, patient examined, no change in status, stable for surgery.  I have reviewed the patient's chart and labs.  Questions were answered to the patient's satisfaction.     Kerry Solis

## 2016-11-29 NOTE — Discharge Instructions (Signed)
Radioactive Seed Implant Home Care Instructions   Activity:    Rest for the remainder of the day.  Do not drive or operate equipment today.  You may resume normal  activities in a few days as instructed by your physician, without risk of harmful radiation exposure to those around you, provided you follow the time and distance precautions on the Radiation Oncology Instruction Sheet.   Meals: Drink plenty of lipuids and eat light foods, such as gelatin or soup this evening .  You may return to normal meal plan tomorrow.  Return To Work: You may return to work as instructed by your physician.  Special Instruction:   If any seeds are found, use tweezers to pick up seeds and place in a glass container of any kind and bring to your physician's office.  Call your physician if any of these symptoms occur:   Persistent or heavy bleeding  Urine stream diminishes or stops completely after catheter is removed  Fever equal to or greater than 101 degrees F  Cloudy urine with a strong foul odor  Severe pain  You may feel some burning pain and/or hesitancy when you urinate after the catheter is removed.  These symptoms may increase over the next few weeks, but should diminish within forur to six weeks.  Applying moist heat to the lower abdomen or a hot tub bath may help relieve the pain.  If the discomfort becomes severe, please call your physician for additional medications.     Post Anesthesia Home Care Instructions  Activity: Get plenty of rest for the remainder of the day. A responsible individual must stay with you for 24 hours following the procedure.  For the next 24 hours, DO NOT: -Drive a car -Operate machinery -Drink alcoholic beverages -Take any medication unless instructed by your physician -Make any legal decisions or sign important papers.  Meals: Start with liquid foods such as gelatin or soup. Progress to regular foods as tolerated. Avoid greasy, spicy, heavy foods. If  nausea and/or vomiting occur, drink only clear liquids until the nausea and/or vomiting subsides. Call your physician if vomiting continues.  Special Instructions/Symptoms: Your throat may feel dry or sore from the anesthesia or the breathing tube placed in your throat during surgery. If this causes discomfort, gargle with warm salt water. The discomfort should disappear within 24 hours.  If you had a scopolamine patch placed behind your ear for the management of post- operative nausea and/or vomiting:  1. The medication in the patch is effective for 72 hours, after which it should be removed.  Wrap patch in a tissue and discard in the trash. Wash hands thoroughly with soap and water. 2. You may remove the patch earlier than 72 hours if you experience unpleasant side effects which may include dry mouth, dizziness or visual disturbances. 3. Avoid touching the patch. Wash your hands with soap and water after contact with the patch.     

## 2016-11-29 NOTE — Anesthesia Procedure Notes (Signed)
Procedure Name: Intubation Date/Time: 11/29/2016 7:56 AM Performed by: Rica Koyanagi Pre-anesthesia Checklist: Patient identified, Emergency Drugs available, Suction available and Patient being monitored Patient Re-evaluated:Patient Re-evaluated prior to inductionOxygen Delivery Method: Circle system utilized Preoxygenation: Pre-oxygenation with 100% oxygen Intubation Type: IV induction Ventilation: Mask ventilation without difficulty Laryngoscope Size: Mac and 4 Grade View: Grade I Tube type: Oral Tube size: 8.0 mm Number of attempts: 1 Airway Equipment and Method: Stylet and Bite block Placement Confirmation: ETT inserted through vocal cords under direct vision,  positive ETCO2 and breath sounds checked- equal and bilateral Secured at: 22 cm Tube secured with: Tape Dental Injury: Teeth and Oropharynx as per pre-operative assessment

## 2016-11-29 NOTE — Transfer of Care (Signed)
  Last Vitals:  Vitals:   11/29/16 0602 11/29/16 0924  BP: (!) 171/75   Pulse: 83   Resp: 16   Temp: 36.4 C (P) 36.6 C    Last Pain:  Vitals:   11/29/16 0602  TempSrc: Oral      Patients Stated Pain Goal: 4 (11/29/16 0701)  Immediate Anesthesia Transfer of Care Note  Patient: Kerry Solis  Procedure(s) Performed: Procedure(s) (LRB): RADIOACTIVE SEED IMPLANT/BRACHYTHERAPY IMPLANT (N/A)  Patient Location: PACU  Anesthesia Type: General  Level of Consciousness: awake, alert  and oriented  Airway & Oxygen Therapy: Patient Spontanous Breathing and Patient connected to face mask oxygen  Post-op Assessment: Report given to PACU RN and Post -op Vital signs reviewed and stable  Post vital signs: Reviewed and stable  Complications: No apparent anesthesia complications

## 2016-11-29 NOTE — Anesthesia Preprocedure Evaluation (Addendum)
Anesthesia Evaluation  Patient identified by MRN, date of birth, ID band Patient awake    Reviewed: Allergy & Precautions, NPO status , Patient's Chart, lab work & pertinent test results  Airway Mallampati: II  TM Distance: >3 FB Neck ROM: Full    Dental  (+) Missing, Poor Dentition, Dental Advisory Given   Pulmonary neg pulmonary ROS,    breath sounds clear to auscultation       Cardiovascular hypertension,  Rhythm:Regular Rate:Normal     Neuro/Psych MS history  Neuromuscular disease    GI/Hepatic Fatty liver    Endo/Other  diabetesDm borderline  Renal/GU      Musculoskeletal  (+) Arthritis ,   Abdominal   Peds  Hematology  (+) Blood dyscrasia, , HIV,   Anesthesia Other Findings   Reproductive/Obstetrics                           Anesthesia Physical Anesthesia Plan  ASA: III  Anesthesia Plan: General   Post-op Pain Management:    Induction: Intravenous  PONV Risk Score and Plan: 2 and Ondansetron and Dexamethasone  Airway Management Planned: LMA  Additional Equipment:   Intra-op Plan:   Post-operative Plan: Extubation in OR  Informed Consent: I have reviewed the patients History and Physical, chart, labs and discussed the procedure including the risks, benefits and alternatives for the proposed anesthesia with the patient or authorized representative who has indicated his/her understanding and acceptance.   Dental advisory given  Plan Discussed with: CRNA  Anesthesia Plan Comments:         Anesthesia Quick Evaluation

## 2016-11-30 ENCOUNTER — Encounter (HOSPITAL_BASED_OUTPATIENT_CLINIC_OR_DEPARTMENT_OTHER): Payer: Self-pay | Admitting: Urology

## 2016-12-03 DIAGNOSIS — Z21 Asymptomatic human immunodeficiency virus [HIV] infection status: Secondary | ICD-10-CM | POA: Diagnosis not present

## 2016-12-03 DIAGNOSIS — F329 Major depressive disorder, single episode, unspecified: Secondary | ICD-10-CM | POA: Diagnosis not present

## 2016-12-03 DIAGNOSIS — G35 Multiple sclerosis: Secondary | ICD-10-CM | POA: Diagnosis not present

## 2016-12-03 DIAGNOSIS — I10 Essential (primary) hypertension: Secondary | ICD-10-CM | POA: Diagnosis not present

## 2016-12-03 DIAGNOSIS — Z8669 Personal history of other diseases of the nervous system and sense organs: Secondary | ICD-10-CM | POA: Diagnosis not present

## 2016-12-03 DIAGNOSIS — E785 Hyperlipidemia, unspecified: Secondary | ICD-10-CM | POA: Diagnosis not present

## 2016-12-03 DIAGNOSIS — Z79899 Other long term (current) drug therapy: Secondary | ICD-10-CM | POA: Diagnosis not present

## 2016-12-03 DIAGNOSIS — H5462 Unqualified visual loss, left eye, normal vision right eye: Secondary | ICD-10-CM | POA: Diagnosis not present

## 2016-12-03 NOTE — Progress Notes (Signed)
  Radiation Oncology         [phone] ________________________________  Name: Kerry Solis MRN: [id number]  Date: 12/03/2016  DOB: Feb 20, 1946       Prostate Seed Implant  ON:GEXBMW, Elayne Snare, MD  No ref. provider found  DIAGNOSIS: 71 year-old gentleman with Stage T2c N0 M0 adenocarcinoma of the prostate with Gleason Score of 3+4, and PSA of 4.1  PROCEDURE: Insertion of radioactive I-125 seeds into the prostate gland.  RADIATION DOSE: 145 Gy, definitive therapy.  TECHNIQUE: Kerry Solis was brought to the operating room with the urologist. He was placed in the dorsolithotomy position. He was catheterized and a rectal tube was inserted. The perineum was shaved, prepped and draped. The ultrasound probe was then introduced into the rectum to see the prostate gland.  TREATMENT DEVICE: A needle grid was attached to the ultrasound probe stand and anchor needles were placed.  3D PLANNING: The prostate was imaged in 3D using a sagittal sweep of the prostate probe. These images were transferred to the planning computer. There, the prostate, urethra and rectum were defined on each axial reconstructed image. Then, the software created an optimized 3D plan and a few seed positions were adjusted. The quality of the plan was reviewed using [facility] information for the target and the following two organs at risk:  Urethra and Rectum.  Then the accepted plan was uploaded to the seed Selectron afterloading unit.  PROSTATE VOLUME STUDY:  Using transrectal ultrasound the volume of the prostate was verified to be 31 cc.  SPECIAL TREATMENT PROCEDURE/SUPERVISION AND HANDLING: The Nucletron FIRST system was used to place the needles under sagittal guidance. A total of 21 needles were used to deposit 81 seeds in the prostate gland. The individual seed activity was 0.308 mCi.    COMPLEX SIMULATION: At the end of the procedure, an anterior radiograph of the pelvis was obtained to document seed positioning and  count. Cystoscopy was performed to check the urethra and bladder.  MICRODOSIMETRY: At the end of the procedure, the patient was emitting 0.089 mR/hr at 1 meter. Accordingly, he was considered safe for hospital discharge.  PLAN: The patient will return to the radiation oncology clinic for post implant CT dosimetry in three weeks.   ________________________________  Sheral Apley Tammi Klippel, M.D.

## 2016-12-12 ENCOUNTER — Encounter: Payer: Self-pay | Admitting: Gastroenterology

## 2016-12-12 ENCOUNTER — Ambulatory Visit (INDEPENDENT_AMBULATORY_CARE_PROVIDER_SITE_OTHER): Payer: Medicare Other | Admitting: Gastroenterology

## 2016-12-12 VITALS — BP 131/75 | HR 92 | Temp 97.9°F | Ht 68.0 in | Wt 213.2 lb

## 2016-12-12 DIAGNOSIS — K760 Fatty (change of) liver, not elsewhere classified: Secondary | ICD-10-CM | POA: Diagnosis not present

## 2016-12-12 DIAGNOSIS — R1310 Dysphagia, unspecified: Secondary | ICD-10-CM | POA: Diagnosis not present

## 2016-12-12 NOTE — Patient Instructions (Addendum)
Continue Protonix once each morning, 30 minutes before breakfast.   We will send blood work to get done in August. Make sure you are following a low fat diet and exercising. If your liver numbers are still up or not improved, I will order an ultrasound.  We will see you in September!   Nonalcoholic Fatty Liver Disease Diet Nonalcoholic fatty liver disease is a condition that causes fat to accumulate in and around the liver. The disease makes it harder for the liver to work the way that it should. Following a healthy diet can help to keep nonalcoholic fatty liver disease under control. It can also help to prevent or improve conditions that are associated with the disease, such as heart disease, diabetes, high blood pressure, and abnormal cholesterol levels. Along with regular exercise, this diet:  Promotes weight loss.  Helps to control blood sugar levels.  Helps to improve the way that the body uses insulin.  What do I need to know about this diet?  Use the glycemic index (GI) to plan your meals. The index tells you how quickly a food will raise your blood sugar. Choose low-GI foods. These foods take a longer time to raise blood sugar.  Keep track of how many calories you take in. Eating the right amount of calories will help you to achieve a healthy weight.  You may want to follow a Mediterranean diet. This diet includes a lot of vegetables, lean meats or fish, whole grains, fruits, and healthy oils and fats. What foods can I eat? Grains Whole grains, such as whole-wheat or whole-grain breads, crackers, tortillas, cereals, and pasta. Stone-ground whole wheat. Pumpernickel bread. Unsweetened oatmeal. Bulgur. Barley. Quinoa. Brown or wild rice. Corn or whole-wheat flour tortillas. Vegetables Lettuce. Spinach. Peas. Beets. Cauliflower. Cabbage. Broccoli. Carrots. Tomatoes. Squash. Eggplant. Herbs. Peppers. Onions. Cucumbers. Brussels sprouts. Yams and sweet potatoes. Beans.  Lentils. Fruits Bananas. Apples. Oranges. Grapes. Papaya. Mango. Pomegranate. Kiwi. Grapefruit. Cherries. Meats and Other Protein Sources Seafood and shellfish. Lean meats. Poultry. Tofu. Dairy Low-fat or fat-free dairy products, such as yogurt, cottage cheese, and cheese. Beverages Water. Sugar-free drinks. Tea. Coffee. Low-fat or skim milk. Milk alternatives, such as soy or almond milk. Real fruit juice. Condiments Mustard. Relish. Low-fat, low-sugar ketchup and barbecue sauce. Low-fat or fat-free mayonnaise. Sweets and Desserts Sugar-free sweets. Fats and Oils Avocado. Canola or olive oil. Nuts and nut butters. Seeds. The items listed above may not be a complete list of recommended foods or beverages. Contact your dietitian for more options. What foods are not recommended? Palm oil and coconut oil. Processed foods. Fried foods. Sweetened drinks, such as sweet tea, milkshakes, snow cones, iced sweet drinks, and sodas. Alcohol. Sweets. Foods that contain a lot of salt or sodium. The items listed above may not be a complete list of foods and beverages to avoid. Contact your dietitian for more information. This information is not intended to replace advice given to you by your health care provider. Make sure you discuss any questions you have with your health care provider. Document Released: 10/19/2014 Document Revised: 11/10/2015 Document Reviewed: 06/29/2014 Elsevier Interactive Patient Education  [employer].

## 2016-12-12 NOTE — Progress Notes (Signed)
Referring Provider: Kathyrn Drown, MD Primary Care Physician:  Kathyrn Drown, MD Primary GI: Dr. Oneida Alar   Chief Complaint  Patient presents with  . Dysphagia    better    HPI:   Kerry Solis is a 71 y.o. male presenting today with a history of dysphagia s/p EGD Feb 2018 with dilation of Schatzki's ring. Improved clinically since dilation.   He has a history of fatty liver with last transaminases elevated with AST 94, ALT 92. Hep C, Hep B, negative 2017. Very little walking. Feels fatigued since prostate seed implants. Drinks tea and soda, 3 glasses of sweet tea a day. NO abdominal pain, N/V. No mental status changes. Protonix once daily.   Past Medical History:  Diagnosis Date  . Anxiety   . Arthritis   . 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 Depression   . Elevated liver enzymes   . Glaucoma   . High triglycerides   . History of gout   . HIV (human immunodeficiency virus infection) ([place])   . Hypertension   . Leukopenia    Low CD4  . Multiple sclerosis ([place])    uses a cane  . Myocardial infarction ([place])    45 years ago  . Prostate cancer [facility])     Past Surgical History:  Procedure Laterality Date  . BIOPSY  08/14/2016   Procedure: BIOPSY;  Surgeon: Danie Binder, MD;  Location: [facility];  Service: Endoscopy;;  gastric bx's  . CHOLECYSTECTOMY  06/10/2011   Procedure: LAPAROSCOPIC CHOLECYSTECTOMY;  Surgeon: Donato Heinz, MD;  Location: [facility] ORS;  Service: General;  Laterality: N/A;  . COLONOSCOPY  03/10/2009   ZSM:OLMBEMLJ internal hemorrhoids/6-mm sessile ascending colon polyp/tortuous colon, diverticulosis. TA  . COLONOSCOPY WITH PROPOFOL N/A 04/19/2015   Dr. Oneida Alar: sessile serrated adenoma, surveillance in 5 years   . ESOPHAGOGASTRODUODENOSCOPY  06/23/2008   QGB:EEFEOFH gastritis/schatzki ring  . ESOPHAGOGASTRODUODENOSCOPY (EGD) WITH PROPOFOL N/A 08/14/2016   Procedure: ESOPHAGOGASTRODUODENOSCOPY (EGD) WITH PROPOFOL;   Surgeon: Danie Binder, MD;  Location: [facility];  Service: Endoscopy;  Laterality: N/A;  7:30am  . NECK SURGERY  unsure   disc  . POLYPECTOMY N/A 04/19/2015   Procedure: POLYPECTOMY;  Surgeon: Danie Binder, MD;  Location: [facility];  Service: Endoscopy;  Laterality: N/A;  ascending colon  . PROSTATE BIOPSY    . RADIOACTIVE SEED IMPLANT N/A 11/29/2016   Procedure: RADIOACTIVE SEED IMPLANT/BRACHYTHERAPY IMPLANT;  Surgeon: Franchot Gallo, MD;  Location: [facility];  Service: Urology;  Laterality: N/A;  81 seeds implanted  . SAVORY DILATION N/A 08/14/2016   Procedure: SAVORY DILATION;  Surgeon: Danie Binder, MD;  Location[facility];  Service: Endoscopy;  Laterality: N/A;    Current Outpatient Prescriptions  Medication Sig Dispense Refill  . acetaminophen (TYLENOL) 500 MG tablet Take 1,000 mg by mouth 3 (three) times daily as needed for mild pain or moderate pain.    Marland Kitchen allopurinol (ZYLOPRIM) 100 MG tablet TAKE (1) TABLET BY MOUTH ONCE DAILY FOR GOUT. 30 tablet 6  . amitriptyline (ELAVIL) 25 MG tablet TAKE TWO TABLETS BY MOUTH DAILY AT BEDTIME  9  . amLODipine (NORVASC) 10 MG tablet TAKE (1) TABLET BY MOUTH DAILY FOR HIGH BLOOD PRESSURE. 30 tablet 6  . aspirin EC 81 MG tablet Take 81 mg by mouth daily.    . carbamazepine (TEGRETOL) 100 MG chewable tablet Chew by mouth 2 (two) times  daily.    . chlorzoxazone (PARAFON) 500 MG tablet Take by mouth 4 (four) times daily as needed for muscle spasms.    . Cholecalciferol (VITAMIN D PO) Take 1 tablet by mouth daily.    . DESCOVY 200-25 MG tablet TAKE ONE TABLET BY MOUTH ONCE DAILY  11  . folic acid (FOLVITE) 1 MG tablet Take 1 mg by mouth daily.    . interferon beta-1a (REBIF) 44 MCG/0.5ML SOSY injection Inject 44 mcg into the skin 3 (three) times a week.    . latanoprost (XALATAN) 0.005 % ophthalmic solution Place 1 drop into the left eye at bedtime.     Marland Kitchen lisinopril-hydrochlorothiazide (PRINZIDE,ZESTORETIC) 10-12.5 MG  tablet TAKE (1) TABLET BY MOUTH DAILY FOR HIGH BLOOD PRESSURE. 30 tablet 5  . Multiple Vitamin (MULTIVITAMIN WITH MINERALS) TABS tablet Take 1 tablet by mouth daily.    Marland Kitchen oxyCODONE (ROXICODONE) 5 MG immediate release tablet Take 1 tablet (5 mg total) by mouth every 6 (six) hours as needed for severe pain. 40 tablet 0  . pantoprazole (PROTONIX) 40 MG tablet Take 1 tablet (40 mg total) by mouth daily. 90 tablet 3  . pravastatin (PRAVACHOL) 40 MG tablet TAKE 1 TABLET BY MOUTH AT BEDTIME FOR CHOLESTEROL 30 tablet 5  . Pyridoxine HCl (VITAMIN B-6 PO) Take 1 tablet by mouth daily.    . sertraline (ZOLOFT) 100 MG tablet TAKE 1 TABLET BY MOUTH ONCE DAILY. 30 tablet 5  . TIVICAY 50 MG tablet TK 1 T PO QD. STOP ATRIPLA  5  . traMADol (ULTRAM) 50 MG tablet Take 1 tablet (50 mg total) by mouth every 6 (six) hours as needed. 15 tablet 0   No current facility-administered medications for this visit.     Allergies as of 12/12/2016 - Review Complete 12/12/2016  Allergen Reaction Noted  . Yellow jacket venom Swelling 03/14/2016    Family History  Problem Relation Age of Onset  . Hypertension Mother   . Heart attack Mother   . Heart attack Father   . Cancer Sister        Breast  . Diabetes Sister   . Diabetes Brother   . Cancer Brother        unknown  . Cancer Brother        colon  . Cancer Sister        breast  . Cancer Brother        prostate  . Cancer Brother        colon    Social History   Social History  . Marital status: Divorced    Spouse name: N/A  . Number of children: N/A  . Years of education: N/A   Social History Main Topics  . Smoking status: Never Smoker  . Smokeless tobacco: Never Used  . Alcohol use No  . Drug use: No  . Sexual activity: No   Other Topics Concern  . None   Social History Narrative  . None    Review of Systems: As mentioned in HPI   Physical Exam: BP 131/75   Pulse 92   Temp 97.9 F (36.6 C) (Oral)   Ht 5\' 8"  (1.727 m)   Wt 213 lb  3.2 oz (96.7 kg)   BMI 32.42 kg/m  General:   Alert and oriented. No distress noted. Pleasant and cooperative.  Head:  Normocephalic and atraumatic. Eyes:  Conjuctiva clear without scleral icterus. Mouth:  Oral mucosa pink and moist. Good dentition. No lesions. Abdomen:  +BS,  soft, non-tender and non-distended. No rebound or guarding. Obese.  Msk:  Symmetrical without gross deformities. Normal posture. Extremities:  Without edema. Neurologic:  Alert and  oriented x4;  grossly normal neurologically. Psych:  Alert and cooperative. Normal mood and affect.

## 2016-12-14 ENCOUNTER — Other Ambulatory Visit: Payer: Self-pay | Admitting: Gastroenterology

## 2016-12-14 DIAGNOSIS — K760 Fatty (change of) liver, not elsewhere classified: Secondary | ICD-10-CM

## 2016-12-15 ENCOUNTER — Encounter: Payer: Self-pay | Admitting: Gastroenterology

## 2016-12-15 NOTE — Assessment & Plan Note (Signed)
History of fatty liver with increased transaminases recently. Drinks soda, tea daily. Discussed dietary/behavior modification. Weight loss. Repeat HFP in August. Ultrasound and further serologies if persistently elevated or worsened.

## 2016-12-15 NOTE — Assessment & Plan Note (Signed)
71 year old male with history of dysphagia s/p dilation of Schatkzi's ring recently and now resolved. Continue Protonix once daily. Return in September for routine follow-up.

## 2016-12-17 NOTE — Progress Notes (Signed)
cc'd to pcp 

## 2016-12-20 ENCOUNTER — Other Ambulatory Visit: Payer: Self-pay

## 2016-12-20 ENCOUNTER — Telehealth: Payer: Self-pay | Admitting: *Deleted

## 2016-12-20 DIAGNOSIS — K760 Fatty (change of) liver, not elsewhere classified: Secondary | ICD-10-CM

## 2016-12-20 NOTE — Telephone Encounter (Signed)
CALLED PATIENT TO REMIND OF POST -SEED APPTS. FOR 12-21-16, SPOKE WITH PATIENT AND HE IS AWARE OF THESE APPTS.

## 2016-12-21 ENCOUNTER — Ambulatory Visit
Admission: RE | Admit: 2016-12-21 | Discharge: 2016-12-21 | Disposition: A | Discharge status: Home / Self Care | Payer: Medicare Other | Source: Ambulatory Visit | Attending: Radiation Oncology | Admitting: Radiation Oncology

## 2016-12-21 ENCOUNTER — Encounter: Payer: Self-pay | Admitting: Radiation Oncology

## 2016-12-21 ENCOUNTER — Encounter: Payer: Self-pay | Admitting: Medical Oncology

## 2016-12-21 DIAGNOSIS — Z7982 Long term (current) use of aspirin: Secondary | ICD-10-CM | POA: Diagnosis not present

## 2016-12-21 DIAGNOSIS — Z510 Encounter for antineoplastic radiation therapy: Secondary | ICD-10-CM | POA: Diagnosis not present

## 2016-12-21 DIAGNOSIS — C61 Malignant neoplasm of prostate: Secondary | ICD-10-CM | POA: Diagnosis present

## 2016-12-21 NOTE — Progress Notes (Signed)
  Radiation Oncology         [phone]) [phone] ________________________________  Name: Kerry Solis MRN: [id number]  Date: 12/21/2016  DOB: 15-Jun-1946  COMPLEX SIMULATION NOTE  NARRATIVE:  The patient was brought to the [facility] today following prostate seed implantation approximately one month ago.  Identity was confirmed.  All relevant records and images related to the planned course of therapy were reviewed.  Then, the patient was set-up supine.  CT images were obtained.  The CT images were loaded into the planning software.  Then the prostate and rectum were contoured.  Treatment planning then occurred.  The implanted iodine 125 seeds were identified by the physics staff for projection of radiation distribution  I have requested : 3D Simulation  I have requested a DVH of the following structures: Prostate and rectum.    ________________________________  Sheral Apley Tammi Klippel, M.D.

## 2016-12-21 NOTE — Progress Notes (Signed)
Radiation Oncology         [phone] ________________________________  Name: Kerry Solis MRN: [id number]  Date: 12/21/2016  DOB: [birth year]  Follow-Up Visit Note  CC: Kerry Drown, MD  Kerry Gallo, MD  Diagnosis:   71 y.o. 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    ICD-10-CM   1. Prostate cancer ([place]) C61     Interval Since Last Radiation:  3  weeks  Narrative:  The patient returns today for routine follow-up.  He is complaining of increased urinary frequency and urinary hesitation symptoms. He filled out a questionnaire regarding urinary function today providing and overall IPSS score of 28 characterizing his symptoms as severe.  His pre-implant score was 17. He denies any bowel symptoms.  ALLERGIES:  is allergic to yellow jacket venom.  Meds: Current Outpatient Prescriptions  Medication Sig Dispense Refill  . acetaminophen (TYLENOL) 500 MG tablet Take 1,000 mg by mouth 3 (three) times daily as needed for mild pain or moderate pain.    Marland Kitchen allopurinol (ZYLOPRIM) 100 MG tablet TAKE (1) TABLET BY MOUTH ONCE DAILY FOR GOUT. 30 tablet 6  . amitriptyline (ELAVIL) 25 MG tablet TAKE TWO TABLETS BY MOUTH DAILY AT BEDTIME  9  . amLODipine (NORVASC) 10 MG tablet TAKE (1) TABLET BY MOUTH DAILY FOR HIGH BLOOD PRESSURE. 30 tablet 6  . aspirin EC 81 MG tablet Take 81 mg by mouth daily.    . carbamazepine (TEGRETOL) 100 MG chewable tablet Chew by mouth 2 (two) times daily.    . chlorzoxazone (PARAFON) 500 MG tablet Take by mouth 4 (four) times daily as needed for muscle spasms.    . Cholecalciferol (VITAMIN D PO) Take 1 tablet by mouth daily.    . DESCOVY 200-25 MG tablet TAKE ONE TABLET BY MOUTH ONCE DAILY  11  . dolutegravir (TIVICAY) 50 MG tablet TAKE 1 TABLET BY MOUTH EVERY DAY. STOP ATRIPLA    . folic acid (FOLVITE) 1 MG tablet Take 1 mg by mouth daily.    . interferon beta-1a (REBIF) 44 MCG/0.5ML SOSY injection  Inject 44 mcg into the skin 3 (three) times a week.    . latanoprost (XALATAN) 0.005 % ophthalmic solution Place 1 drop into the left eye at bedtime.     Marland Kitchen lisinopril-hydrochlorothiazide (PRINZIDE,ZESTORETIC) 10-12.5 MG tablet TAKE (1) TABLET BY MOUTH DAILY FOR HIGH BLOOD PRESSURE. 30 tablet 5  . Multiple Vitamin (MULTIVITAMIN WITH MINERALS) TABS tablet Take 1 tablet by mouth daily.    Marland Kitchen oxyCODONE (ROXICODONE) 5 MG immediate release tablet Take 1 tablet (5 mg total) by mouth every 6 (six) hours as needed for severe pain. 40 tablet 0  . pantoprazole (PROTONIX) 40 MG tablet Take 1 tablet (40 mg total) by mouth daily. 90 tablet 3  . pravastatin (PRAVACHOL) 40 MG tablet TAKE 1 TABLET BY MOUTH AT BEDTIME FOR CHOLESTEROL 30 tablet 5  . Pyridoxine HCl (VITAMIN B-6 PO) Take 1 tablet by mouth daily.    . sertraline (ZOLOFT) 100 MG tablet TAKE 1 TABLET BY MOUTH ONCE DAILY. 30 tablet 5  . traMADol (ULTRAM) 50 MG tablet Take 1 tablet (50 mg total) by mouth every 6 (six) hours as needed. 15 tablet 0   No current facility-administered medications for this encounter.     Physical Findings: The patient is in no acute distress. Patient is alert and oriented.  weight is 214 lb 3.2 oz (97.2 kg). His blood  pressure is 127/87 and his pulse is 85. His respiration is 16 and oxygen saturation is 100%. .  No significant changes.  Lab Findings: Lab Results  Component Value Date   WBC 3.6 (L) 11/05/2016   HGB 14.0 11/05/2016   HCT 43.0 11/05/2016   MCV 88.7 11/05/2016   PLT 245 11/05/2016    Radiographic Findings:  Patient underwent CT imaging in our clinic for post implant dosimetry. The CT appears to demonstrate an adequate distribution of radioactive seeds throughout the prostate gland. There no seeds in her near the rectum. I suspect the final radiation plan and dosimetry will show appropriate coverage of the prostate gland.   Impression: The patient is recovering from the effects of radiation. His urinary  symptoms should gradually improve over the next 4-6 months. We talked about this today. He is encouraged by his improvement already and is otherwise please with his outcome.   Plan: Today, I spent time talking to the patient about his prostate seed implant and resolving urinary symptoms. We also talked about PRN follow-up for prostate cancer following seed implant. He understands that ongoing PSA determinations and digital rectal exams will help perform surveillance to rule out disease recurrence. He understands what to expect with his PSA measures. Patient was also educated today about some of the long-term effects from radiation including a small risk for rectal bleeding and possibly erectile dysfunction. We talked about some of the general management approaches to these potential complications. However, I did encourage the patient to contact our office or return at any point if he has questions or concerns related to his previous radiation and prostate cancer.  _____________________________________  Sheral Apley. Tammi Klippel, M.D.  This document serves as a record of services personally performed by [name] MD. It was created on his behalf by Delton Coombes, a trained medical scribe. The creation of this record is based on the scribe's personal observations and the provider's statements to them. This document has been checked and approved by the attending provider.

## 2016-12-21 NOTE — Progress Notes (Signed)
Weight and vitals stable. Denies pain. Pre seed IPSS 17. Post seed IPSS 28. Denies dysuria or hematuria. Reports urinary leakage. Romie Jumper calling to establish a follow up appointment with Dr. Diona Fanti.    BP 127/87 (BP Location: Left Arm, Patient Position: Sitting, Cuff Size: Normal)   Pulse 85   Resp 16   Wt 214 lb 3.2 oz (97.2 kg)   SpO2 100%   BMI 32.57 kg/m  Wt Readings from Last 3 Encounters:  12/21/16 214 lb 3.2 oz (97.2 kg)  12/12/16 213 lb 3.2 oz (96.7 kg)  11/29/16 211 lb (95.7 kg)

## 2016-12-21 NOTE — Progress Notes (Signed)
Kerry Solis states he did well with seed implant. He has been pretty fatigued but is not sure if this is related to the implant or his MS. He states that extremely hot or cold weather impacts his MS. He follows up today with Dr. Tammi Klippel. I asked him to call me with questions or concerns.

## 2016-12-23 NOTE — Progress Notes (Signed)
REVIEWED-NO ADDITIONAL RECOMMENDATIONS. 

## 2016-12-24 ENCOUNTER — Encounter: Payer: Self-pay | Admitting: Radiation Oncology

## 2016-12-24 DIAGNOSIS — C61 Malignant neoplasm of prostate: Secondary | ICD-10-CM | POA: Diagnosis not present

## 2016-12-24 DIAGNOSIS — Z7982 Long term (current) use of aspirin: Secondary | ICD-10-CM | POA: Diagnosis not present

## 2016-12-24 DIAGNOSIS — Z510 Encounter for antineoplastic radiation therapy: Secondary | ICD-10-CM | POA: Diagnosis not present

## 2016-12-29 NOTE — Progress Notes (Signed)
  Radiation Oncology         [phone]) [phone] ________________________________  Name: [place] GROSSER MRN: [id number]  Date: 12/24/2016  DOB: December 29, 1945  3D Planning Note   Prostate Brachytherapy Post-Implant Dosimetry  Diagnosis: 71 year-old gentleman with Stage T2c N0 M0 adenocarcinoma of the prostate with Gleason Score of 3+4, and PSA of 4.1   Narrative: On a previous date, Kerry Solis returned following prostate seed implantation for post implant planning. He underwent CT scan complex simulation to delineate the three-dimensional structures of the pelvis and demonstrate the radiation distribution.  Since that time, the seed localization, and complex isodose planning with dose volume histograms have now been completed.  Results:   Prostate Coverage - The dose of radiation delivered to the 90% or more of the prostate gland (D90) was 119.64% of the prescription dose. This exceeds our goal of greater than 90%. Rectal Sparing - The volume of rectal tissue receiving the prescription dose or higher was 0.0 cc. This falls under our thresholds tolerance of 1.0 cc.  Impression: The prostate seed implant appears to show adequate target coverage and appropriate rectal sparing.  Plan:  The patient will continue to follow with urology for ongoing PSA determinations. I would anticipate a high likelihood for local tumor control with minimal risk for rectal morbidity.  ________________________________  Kerry Solis, M.D.

## 2017-01-18 DIAGNOSIS — H40001 Preglaucoma, unspecified, right eye: Secondary | ICD-10-CM | POA: Diagnosis not present

## 2017-01-18 DIAGNOSIS — H2513 Age-related nuclear cataract, bilateral: Secondary | ICD-10-CM | POA: Diagnosis not present

## 2017-01-18 DIAGNOSIS — G35 Multiple sclerosis: Secondary | ICD-10-CM | POA: Diagnosis not present

## 2017-01-18 DIAGNOSIS — I10 Essential (primary) hypertension: Secondary | ICD-10-CM | POA: Diagnosis not present

## 2017-01-18 DIAGNOSIS — Z79899 Other long term (current) drug therapy: Secondary | ICD-10-CM | POA: Diagnosis not present

## 2017-01-18 DIAGNOSIS — H401124 Primary open-angle glaucoma, left eye, indeterminate stage: Secondary | ICD-10-CM | POA: Diagnosis not present

## 2017-01-18 DIAGNOSIS — Z7982 Long term (current) use of aspirin: Secondary | ICD-10-CM | POA: Diagnosis not present

## 2017-01-18 DIAGNOSIS — H5462 Unqualified visual loss, left eye, normal vision right eye: Secondary | ICD-10-CM | POA: Diagnosis not present

## 2017-01-18 DIAGNOSIS — H578 Other specified disorders of eye and adnexa: Secondary | ICD-10-CM | POA: Diagnosis not present

## 2017-01-18 DIAGNOSIS — B20 Human immunodeficiency virus [HIV] disease: Secondary | ICD-10-CM | POA: Diagnosis not present

## 2017-01-18 DIAGNOSIS — F329 Major depressive disorder, single episode, unspecified: Secondary | ICD-10-CM | POA: Diagnosis not present

## 2017-01-18 DIAGNOSIS — E785 Hyperlipidemia, unspecified: Secondary | ICD-10-CM | POA: Diagnosis not present

## 2017-02-28 ENCOUNTER — Ambulatory Visit (INDEPENDENT_AMBULATORY_CARE_PROVIDER_SITE_OTHER): Payer: Medicare Other | Admitting: Family Medicine

## 2017-02-28 ENCOUNTER — Encounter: Payer: Self-pay | Admitting: Family Medicine

## 2017-02-28 VITALS — BP 130/74 | Ht 68.0 in | Wt 209.0 lb

## 2017-02-28 DIAGNOSIS — Z23 Encounter for immunization: Secondary | ICD-10-CM | POA: Diagnosis not present

## 2017-02-28 DIAGNOSIS — R634 Abnormal weight loss: Secondary | ICD-10-CM

## 2017-02-28 DIAGNOSIS — R1030 Lower abdominal pain, unspecified: Secondary | ICD-10-CM

## 2017-02-28 DIAGNOSIS — R748 Abnormal levels of other serum enzymes: Secondary | ICD-10-CM

## 2017-02-28 MED ORDER — POLYETHYLENE GLYCOL 3350 17 GM/SCOOP PO POWD: 17.0000 g | Freq: Every day | ORAL | 5 refills | Status: DC

## 2017-02-28 NOTE — Progress Notes (Addendum)
   Subjective:    Patient ID: Kerry Solis, male    DOB: [birth year], 71 y.o.   MRN: [id number]  Hypertension  This is a chronic problem. Pertinent negatives include no chest pain. Compliance problems include exercise and diet.    Pain on right side. Started a couple months ago. Tried tylenol. No relief.  He states appetite is fair he relates lower abdominal pain more on the right side started a couple months goes become progressive over the past couple months also difficult time having a bowel movement with severe constipation hard bowel movements hardly able to get any the past has pain with his lower abdomen and pain with going to the bathroom. Flu vaccine today.  Pain started 2 months ago daily increasing difficulty morning the right lower area some in the mid lower and left lower area past medical history reviewed  Review of Systems  Constitutional: Negative for activity change, appetite change and fatigue.  HENT: Negative for congestion.   Respiratory: Negative for cough.   Cardiovascular: Negative for chest pain.  Gastrointestinal: Positive for abdominal pain and constipation.  Endocrine: Negative for polydipsia and polyphagia.  Neurological: Negative for weakness.  Psychiatric/Behavioral: Negative for confusion.   Negative for wheezing shortness of breath sweats chills fevers positive for weight loss    Objective:   Physical Exam  Constitutional: He appears well-nourished. No distress.  Cardiovascular: Normal rate, regular rhythm and normal heart sounds.   No murmur heard. Pulmonary/Chest: Effort normal and breath sounds normal. No respiratory distress.  Abdominal: Soft. He exhibits no mass. There is tenderness. There is no rebound and no guarding.  Musculoskeletal: He exhibits no edema.  Lymphadenopathy:    He has no cervical adenopathy.  Neurological: He is alert.  Psychiatric: His behavior is normal.  Vitals reviewed.  Significant tenderness lower abdominal region  left and right side no mass was felt but he does have significant tenderness Patient has lost 10 pounds over the past 3 months has not been trying to lose weight  Patient understood that further testing may be necessary    Assessment & Plan:  Lower abdominal pain with weight loss this been present over the past 6 weeks progressive difficult time passing bowel movements I recommend MiraLAX and FiberCon in addition to this I recommend doing lab work and CAT scan to rule out possibility of a tumor patient does have a history of prostate cancer and also has a history of HIV as well as in mass this puts him at higher risk of developing cancer his last colonoscopy was 2016

## 2017-03-01 ENCOUNTER — Ambulatory Visit: Payer: Medicare Other | Admitting: Family Medicine

## 2017-03-01 LAB — CBC WITH DIFFERENTIAL/PLATELET
Basophils Absolute: 0 10*3/uL (ref 0.0–0.2)
Basos: 0 %
EOS (ABSOLUTE): 0 10*3/uL (ref 0.0–0.4)
Eos: 1 %
Hematocrit: 39.5 % (ref 37.5–51.0)
Hemoglobin: 13.1 g/dL (ref 13.0–17.7)
Immature Grans (Abs): 0 10*3/uL (ref 0.0–0.1)
Immature Granulocytes: 0 %
Lymphocytes Absolute: 1.1 10*3/uL (ref 0.7–3.1)
Lymphs: 37 %
MCH: 28.4 pg (ref 26.6–33.0)
MCHC: 33.2 g/dL (ref 31.5–35.7)
MCV: 86 fL (ref 79–97)
Monocytes Absolute: 0.5 10*3/uL (ref 0.1–0.9)
Monocytes: 16 %
Neutrophils Absolute: 1.3 10*3/uL — ABNORMAL LOW (ref 1.4–7.0)
Neutrophils: 46 %
Platelets: 223 10*3/uL (ref 150–379)
RBC: 4.61 x10E6/uL (ref 4.14–5.80)
RDW: 14.8 % (ref 12.3–15.4)
WBC: 2.9 10*3/uL — ABNORMAL LOW (ref 3.4–10.8)

## 2017-03-01 LAB — BASIC METABOLIC PANEL
BUN/Creatinine Ratio: 12 (ref 10–24)
BUN: 18 mg/dL (ref 8–27)
CO2: 23 mmol/L (ref 20–29)
Calcium: 9.7 mg/dL (ref 8.6–10.2)
Chloride: 108 mmol/L — ABNORMAL HIGH (ref 96–106)
Creatinine, Ser: 1.55 mg/dL — ABNORMAL HIGH (ref 0.76–1.27)
GFR calc Af Amer: 51 mL/min/{1.73_m2} — ABNORMAL LOW (ref >59)
GFR calc non Af Amer: 44 mL/min/{1.73_m2} — ABNORMAL LOW (ref >59)
Glucose: 91 mg/dL (ref 65–99)
Potassium: 4.7 mmol/L (ref 3.5–5.2)
Sodium: 145 mmol/L — ABNORMAL HIGH (ref 134–144)

## 2017-03-01 LAB — HEPATIC FUNCTION PANEL
ALT: 76 IU/L — ABNORMAL HIGH (ref 0–44)
AST: 87 IU/L — ABNORMAL HIGH (ref 0–40)
Albumin: 4.7 g/dL (ref 3.5–4.8)
Alkaline Phosphatase: 65 IU/L (ref 39–117)
Bilirubin Total: 0.3 mg/dL (ref 0.0–1.2)
Bilirubin, Direct: 0.15 mg/dL (ref 0.00–0.40)
Total Protein: 7.6 g/dL (ref 6.0–8.5)

## 2017-03-01 LAB — LIPASE: Lipase: 41 U/L (ref 13–78)

## 2017-03-01 NOTE — Addendum Note (Signed)
Addended by: Carmelina Noun on: 03/01/2017 01:44 PM   Modules accepted: Orders

## 2017-03-07 ENCOUNTER — Ambulatory Visit: Payer: Medicare Other | Admitting: Gastroenterology

## 2017-03-11 ENCOUNTER — Ambulatory Visit (HOSPITAL_COMMUNITY): Admission: RE | Admit: 2017-03-11 | Payer: Medicare Other | Source: Ambulatory Visit

## 2017-03-12 ENCOUNTER — Ambulatory Visit (HOSPITAL_COMMUNITY): Payer: Medicare Other

## 2017-03-18 ENCOUNTER — Ambulatory Visit (HOSPITAL_COMMUNITY)
Admission: RE | Admit: 2017-03-18 | Discharge: 2017-03-18 | Disposition: A | Discharge status: Home / Self Care | Payer: Medicare Other | Source: Ambulatory Visit | Attending: Family Medicine | Admitting: Family Medicine

## 2017-03-18 DIAGNOSIS — R945 Abnormal results of liver function studies: Secondary | ICD-10-CM | POA: Diagnosis not present

## 2017-03-18 DIAGNOSIS — R748 Abnormal levels of other serum enzymes: Secondary | ICD-10-CM | POA: Insufficient documentation

## 2017-03-19 ENCOUNTER — Ambulatory Visit (HOSPITAL_COMMUNITY)
Admission: RE | Admit: 2017-03-19 | Discharge: 2017-03-19 | Disposition: A | Discharge status: Home / Self Care | Payer: Medicare Other | Source: Ambulatory Visit | Attending: Family Medicine | Admitting: Family Medicine

## 2017-03-19 DIAGNOSIS — K5730 Diverticulosis of large intestine without perforation or abscess without bleeding: Secondary | ICD-10-CM | POA: Insufficient documentation

## 2017-03-19 DIAGNOSIS — I700 Atherosclerosis of aorta: Secondary | ICD-10-CM | POA: Diagnosis not present

## 2017-03-19 DIAGNOSIS — R634 Abnormal weight loss: Secondary | ICD-10-CM | POA: Diagnosis not present

## 2017-03-19 DIAGNOSIS — N200 Calculus of kidney: Secondary | ICD-10-CM | POA: Insufficient documentation

## 2017-03-19 DIAGNOSIS — R1030 Lower abdominal pain, unspecified: Secondary | ICD-10-CM | POA: Diagnosis not present

## 2017-03-19 MED ORDER — IOPAMIDOL (ISOVUE-370) INJECTION 76%: 100.0000 mL | Freq: Once | INTRAVENOUS | Status: DC | PRN

## 2017-03-19 MED ORDER — IOPAMIDOL (ISOVUE-300) INJECTION 61%
100.0000 mL | Freq: Once | INTRAVENOUS | Status: AC | PRN
  Administered 2017-03-19: 100 mL via INTRAVENOUS

## 2017-03-20 DIAGNOSIS — C61 Malignant neoplasm of prostate: Secondary | ICD-10-CM | POA: Diagnosis not present

## 2017-03-25 DIAGNOSIS — N401 Enlarged prostate with lower urinary tract symptoms: Secondary | ICD-10-CM | POA: Diagnosis not present

## 2017-03-25 DIAGNOSIS — R3912 Poor urinary stream: Secondary | ICD-10-CM | POA: Diagnosis not present

## 2017-03-25 DIAGNOSIS — C61 Malignant neoplasm of prostate: Secondary | ICD-10-CM | POA: Diagnosis not present

## 2017-03-27 ENCOUNTER — Ambulatory Visit (INDEPENDENT_AMBULATORY_CARE_PROVIDER_SITE_OTHER): Payer: Medicare Other | Admitting: Family Medicine

## 2017-03-27 ENCOUNTER — Encounter: Payer: Self-pay | Admitting: Family Medicine

## 2017-03-27 VITALS — BP 112/70 | Ht 68.0 in | Wt 205.0 lb

## 2017-03-27 DIAGNOSIS — R740 Nonspecific elevation of levels of transaminase and lactic acid dehydrogenase [LDH]: Secondary | ICD-10-CM | POA: Diagnosis not present

## 2017-03-27 DIAGNOSIS — R7401 Elevation of levels of liver transaminase levels: Secondary | ICD-10-CM

## 2017-03-27 MED ORDER — AMLODIPINE BESYLATE 5 MG PO TABS: ORAL_TABLET | ORAL | 6 refills | Status: DC

## 2017-03-27 NOTE — Progress Notes (Signed)
   Subjective:    Patient ID: Kerry Solis, male    DOB: [birth year], 70 y.o.   MRN: [id number]  HPI Patient is here today to follow up on lab results.He says he is concerned about prostate. Patient's lab work overall looks good but he does have elevated liver in the time his recent scan in study does not show any significant issues within the liver itself in regards to fatty liver severe for I believe the patient would benefit from gastroenterology consultation depending on how a follow-up liver profile seems denies nausea vomiting fever chills sweats History of MS, HIV treatment Review of Systems    negative for cough vomiting fever chills sweats Objective:   Physical Exam Lungs clear hearts regular HEENT benign mild obesity abdomen soft no masses felt       Assessment & Plan:  Elevated transaminase Stop cholesterol medicine for now Recheck liver functions in 4 weeks If no significant difference restart statin If ongoing elevated liver enzyme referral to gastroenterology at [facility].

## 2017-03-27 NOTE — Patient Instructions (Addendum)
Stop Pravastatin  Recheck liver function in 7 weeks and recheck here in 8 weeks  Stop amlodipine 10 mg  Use the 5mg  amlodipine

## 2017-04-23 ENCOUNTER — Other Ambulatory Visit: Payer: Self-pay | Admitting: Family Medicine

## 2017-04-24 ENCOUNTER — Encounter: Payer: Self-pay | Admitting: Gastroenterology

## 2017-04-24 ENCOUNTER — Ambulatory Visit (INDEPENDENT_AMBULATORY_CARE_PROVIDER_SITE_OTHER): Payer: Medicare Other | Admitting: Gastroenterology

## 2017-04-24 VITALS — BP 147/85 | HR 91 | Temp 97.5°F | Ht 68.0 in | Wt 203.8 lb

## 2017-04-24 DIAGNOSIS — R740 Nonspecific elevation of levels of transaminase and lactic acid dehydrogenase [LDH]: Secondary | ICD-10-CM | POA: Diagnosis not present

## 2017-04-24 DIAGNOSIS — R945 Abnormal results of liver function studies: Secondary | ICD-10-CM | POA: Diagnosis not present

## 2017-04-24 DIAGNOSIS — R7401 Elevation of levels of liver transaminase levels: Secondary | ICD-10-CM

## 2017-04-24 DIAGNOSIS — K869 Disease of pancreas, unspecified: Secondary | ICD-10-CM

## 2017-04-24 NOTE — Patient Instructions (Signed)
We will see what the liver numbers look like when you have it checked (since you will have been off the cholesterol medication). I anticipate we will probably be ordering more labs to further see why they have been elevated.  I have ordered an MRI of your pancreas for further evaluation.  We will see you in 3 months!

## 2017-04-24 NOTE — Assessment & Plan Note (Signed)
Non-specific area of pancreas on CT. MRI ordered.

## 2017-04-24 NOTE — Progress Notes (Signed)
CC'ED TO PCP 

## 2017-04-24 NOTE — Progress Notes (Signed)
Referring Provider: Kathyrn Drown, MD Primary Care Physician:  Kathyrn Drown, MD Primary GI: Dr. Oneida Alar   Chief Complaint  Patient presents with  . fatty liver    f/u    HPI:   Kerry Solis is a 71 y.o. male presenting today with a history of dysphagia s/p EGD Feb 2018 with dilation of Schatzki's ring. He has a history of fatty liver with last transaminases elevated with AST 94, ALT 92. Hep C, Hep B, negative 2017. Most recent LFTs in Sept 2018 remaining about 2 times upper limits of normal. US abdomen Oct 2018 within normal liver. CT abdomen Mar 19, 2017 with small area within the pancreas of uncertain significance. Statin has been stopped as of Mar 27, 2017. Recheck of HFP in 4 weeks planned per Dr. [name].   No dysphagia. No abdominal pain. Appetite is not great. States sometimes he forgets to eat. Weight loss noted over the past year. Highest weight 219. No issues with reflux. Taking Miralax for constipation, doesn't want to change. Takes only as needed. Feeling forgetful at times. States PCP is aware.   Past Medical History:  Diagnosis Date  . Anxiety   . Arthritis   . 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 Depression   . Elevated liver enzymes   . Glaucoma   . High triglycerides   . History of gout   . HIV (human immunodeficiency virus infection) ([place])   . Hypertension   . Leukopenia    Low CD4  . Multiple sclerosis ([place])    uses a cane  . Myocardial infarction ([place])    45 years ago  . Prostate cancer [facility])     Past Surgical History:  Procedure Laterality Date  . COLONOSCOPY  03/10/2009   [id number] internal hemorrhoids/6-mm sessile ascending colon polyp/tortuous colon, diverticulosis. TA  . ESOPHAGOGASTRODUODENOSCOPY  06/23/2008   [id number] gastritis/schatzki ring  . NECK SURGERY  unsure   disc  . PROSTATE BIOPSY    NOTE: Patient  [place] incomplete in this note due to computer issues. See history tab for complete information.  History tab information was reviewed with the patient and found to be correct.    Current Outpatient Medications  Medication Sig Dispense Refill  . acetaminophen (TYLENOL) 500 MG tablet Take 1,000 mg by mouth 3 (three) times daily as needed for mild pain or moderate pain.    Marland Kitchen allopurinol (ZYLOPRIM) 100 MG tablet TAKE (1) TABLET BY MOUTH ONCE DAILY FOR GOUT. 30 tablet 6  . amitriptyline (ELAVIL) 25 MG tablet TAKE TWO TABLETS BY MOUTH DAILY AT BEDTIME  9  . amLODipine (NORVASC) 5 MG tablet TAKE (1) TABLET BY MOUTH DAILY FOR HIGH BLOOD PRESSURE. 30 tablet 6  . aspirin EC 81 MG tablet Take 81 mg by mouth daily.    . carbamazepine (TEGRETOL) 100 MG chewable tablet Chew daily by mouth.     . chlorzoxazone (PARAFON) 500 MG tablet Take by mouth 4 (four) times daily as needed for muscle spasms.    . Cholecalciferol (VITAMIN D PO) Take 1 tablet by mouth daily.    . DESCOVY 200-25 MG tablet TAKE ONE TABLET BY MOUTH ONCE DAILY  11  . dolutegravir (TIVICAY) 50 MG tablet TAKE 1 TABLET BY MOUTH EVERY DAY. STOP ATRIPLA    . folic acid (FOLVITE) 1 MG tablet Take 1 mg by mouth daily.    . interferon beta-1a (REBIF) 44 MCG/0.5ML SOSY injection Inject 44 mcg  into the skin 3 (three) times a week.    . latanoprost (XALATAN) 0.005 % ophthalmic solution Place 1 drop at bedtime into both eyes.     Marland Kitchen lisinopril-hydrochlorothiazide (PRINZIDE,ZESTORETIC) 10-12.5 MG tablet TAKE (1) TABLET BY MOUTH DAILY FOR HIGH BLOOD PRESSURE. 30 tablet 5  . Multiple Vitamin (MULTIVITAMIN WITH MINERALS) TABS tablet Take 1 tablet by mouth daily.    Marland Kitchen oxyCODONE (ROXICODONE) 5 MG immediate release tablet Take 1 tablet (5 mg total) by mouth every 6 (six) hours as needed for severe pain. 40 tablet 0  . pantoprazole (PROTONIX) 40 MG tablet Take 1 tablet (40 mg total) by mouth daily. 90 tablet 3  . polyethylene glycol powder (GLYCOLAX/MIRALAX) powder Take 17 g by mouth daily. 3350 g 5  . Pyridoxine HCl (VITAMIN B-6 PO) Take 1 tablet by mouth  daily.    . sertraline (ZOLOFT) 100 MG tablet TAKE 1 TABLET BY MOUTH ONCE DAILY. 30 tablet 5  . traMADol (ULTRAM) 50 MG tablet Take 1 tablet (50 mg total) by mouth every 6 (six) hours as needed. 15 tablet 0   No current facility-administered medications for this visit.     Allergies as of 04/24/2017 - Review Complete 04/24/2017  Allergen Reaction Noted  . Yellow jacket venom Swelling 03/14/2016    Family History  Problem Relation Age of Onset  . Hypertension Mother   . Heart attack Mother   . Heart attack Father   . Cancer Sister        Breast  . Diabetes Sister   . Diabetes Brother   . Cancer Brother        unknown  . Cancer Brother        colon  . Cancer Sister        breast  . Cancer Brother        prostate  . Cancer Brother        colon    Social History   Socioeconomic History  . Marital status: Divorced    Spouse name: None  . Number of children: None  . Years of education: None  . Highest education level: None  Social Needs  . Financial resource strain: None  . Food insecurity - worry: None  . Food insecurity - inability: None  . Transportation needs - medical: None  . Transportation needs - non-medical: None  Occupational History  . None  Tobacco Use  . Smoking status: Never Smoker  . Smokeless tobacco: Never Used  Substance and Sexual Activity  . Alcohol use: No  . Drug use: No  . Sexual activity: No    Birth control/protection: Abstinence  Other Topics Concern  . None  Social History Narrative  . None    Review of Systems: Gen: see HPI  CV: Denies chest pain, palpitations, syncope, peripheral edema, and claudication. Resp: Denies dyspnea at rest, cough, wheezing, coughing up blood, and pleurisy. GI: see HPI  Derm: Denies rash, itching, dry skin Psych: Denies depression, anxiety, memory loss, confusion. No homicidal or suicidal ideation.  Heme: Denies bruising, bleeding, and enlarged lymph nodes.  Physical Exam: BP (!) 147/85   Pulse  91   Temp (!) 97.5 F (36.4 C) (Oral)   Ht 5\' 8"  (1.727 m)   Wt 203 lb 12.8 oz (92.4 kg)   BMI 30.99 kg/m  General:   Alert and oriented. No distress noted. Pleasant and cooperative.  Head:  Normocephalic and atraumatic. Eyes:  Conjuctiva clear without scleral icterus. Mouth:  Oral mucosa pink  and moist.  Abdomen:  +BS, soft, non-tender and non-distended. No rebound or guarding. No HSM or masses noted. Msk:  Symmetrical without gross deformities. Normal posture. Extremities:  Without edema. Neurologic:  Alert and  oriented x4 Psych:  Alert and cooperative. Normal mood and affect.

## 2017-04-24 NOTE — Assessment & Plan Note (Addendum)
71 year old male with persistently elevated transaminases, 2 times upper limits of normal, now holding statin per PCP's guidance and rechecking LFTs in a few weeks. CT abdomen and [place] on file with normal liver. Rechecking HFP in near future per PCP. Will follow this result and likely order extensive serologies at that time. Negative Hep B and C on file. 3 month return.

## 2017-04-30 ENCOUNTER — Ambulatory Visit (HOSPITAL_COMMUNITY): Admission: RE | Admit: 2017-04-30 | Payer: Medicare Other | Source: Ambulatory Visit

## 2017-05-07 DIAGNOSIS — R740 Nonspecific elevation of levels of transaminase and lactic acid dehydrogenase [LDH]: Secondary | ICD-10-CM | POA: Diagnosis not present

## 2017-05-08 ENCOUNTER — Encounter: Payer: Self-pay | Admitting: Family Medicine

## 2017-05-08 LAB — HEPATIC FUNCTION PANEL
ALT: 46 IU/L — ABNORMAL HIGH (ref 0–44)
AST: 45 IU/L — ABNORMAL HIGH (ref 0–40)
Albumin: 4.8 g/dL (ref 3.5–4.8)
Alkaline Phosphatase: 71 IU/L (ref 39–117)
Bilirubin Total: 0.3 mg/dL (ref 0.0–1.2)
Bilirubin, Direct: 0.13 mg/dL (ref 0.00–0.40)
Total Protein: 8 g/dL (ref 6.0–8.5)

## 2017-05-22 ENCOUNTER — Ambulatory Visit (INDEPENDENT_AMBULATORY_CARE_PROVIDER_SITE_OTHER): Payer: Medicare Other | Admitting: Family Medicine

## 2017-05-22 ENCOUNTER — Encounter: Payer: Self-pay | Admitting: Family Medicine

## 2017-05-22 VITALS — BP 138/86 | Ht 68.0 in | Wt 202.0 lb

## 2017-05-22 DIAGNOSIS — R7401 Elevation of levels of liver transaminase levels: Secondary | ICD-10-CM

## 2017-05-22 DIAGNOSIS — I10 Essential (primary) hypertension: Secondary | ICD-10-CM

## 2017-05-22 DIAGNOSIS — E119 Type 2 diabetes mellitus without complications: Secondary | ICD-10-CM | POA: Diagnosis not present

## 2017-05-22 DIAGNOSIS — R740 Nonspecific elevation of levels of transaminase and lactic acid dehydrogenase [LDH]: Secondary | ICD-10-CM | POA: Diagnosis not present

## 2017-05-22 LAB — POCT GLYCOSYLATED HEMOGLOBIN (HGB A1C): Hemoglobin A1C: 6.1

## 2017-05-22 MED ORDER — ZOSTER VAC RECOMB ADJUVANTED 50 MCG/0.5ML IM SUSR: 0.5000 mL | Freq: Once | INTRAMUSCULAR | 0 refills | Status: AC

## 2017-05-22 NOTE — Progress Notes (Signed)
   Subjective:    Patient ID: Kerry Solis, male    DOB: 08/23/1945, 71 y.o.   MRN: [id number]  Hyperlipidemia  This is a chronic problem. The current episode started more than 1 year ago. Pertinent negatives include no chest pain. Treatments tried: prabastatin was stopped at last visit due to elevated liver enzymes.   Pt having pain in both feet. Pain is off and on. Started a couple weeks ago.  Patient had lab work liver enzymes been looking somewhat better He states blood pressure is been under good control Diabetes under fairly good control Watching diet staying active Results for orders placed or performed in visit on 05/22/17  POCT glycosylated hemoglobin (Hb A1C)  Result Value Ref Range   Hemoglobin A1C 6.1     Review of Systems  Constitutional: Negative for activity change, appetite change and fatigue.  HENT: Negative for congestion.   Respiratory: Negative for cough.   Cardiovascular: Negative for chest pain.  Gastrointestinal: Negative for abdominal pain.  Endocrine: Negative for polydipsia and polyphagia.  Neurological: Negative for weakness.  Psychiatric/Behavioral: Negative for confusion.       Objective:   Physical Exam  Constitutional: He appears well-nourished. No distress.  Cardiovascular: Normal rate, regular rhythm and normal heart sounds.  No murmur heard. Pulmonary/Chest: Effort normal and breath sounds normal. No respiratory distress.  Musculoskeletal: He exhibits no edema.  Lymphadenopathy:    He has no cervical adenopathy.  Neurological: He is alert.  Psychiatric: His behavior is normal.  Vitals reviewed.         Assessment & Plan:  HIV followed by infectious disease specialist at [facility] under good control Liver enzymes mildly elevated not as bad as what they have been watch diet closely comprehensive lab work 6 months Diabetes good control watch diet stay physically active Patient denies being depressed but I have encouraged him to get out  more

## 2017-05-23 ENCOUNTER — Other Ambulatory Visit: Payer: Self-pay | Admitting: Gastroenterology

## 2017-05-23 ENCOUNTER — Other Ambulatory Visit: Payer: Self-pay | Admitting: Family Medicine

## 2017-06-24 NOTE — Progress Notes (Signed)
Pt does not want to do the MRI until after his appt with Roseanne Kaufman, NP on 07/25/2017.

## 2017-06-24 NOTE — Progress Notes (Signed)
Liver numbers improved in Nov 2018. We had ordered an MRI, but he didn't do this. Is he going to pursue this? I should be seeing him back soon.

## 2017-06-26 DIAGNOSIS — F418 Other specified anxiety disorders: Secondary | ICD-10-CM | POA: Diagnosis not present

## 2017-06-26 DIAGNOSIS — K0889 Other specified disorders of teeth and supporting structures: Secondary | ICD-10-CM | POA: Diagnosis not present

## 2017-06-26 DIAGNOSIS — Z792 Long term (current) use of antibiotics: Secondary | ICD-10-CM | POA: Diagnosis not present

## 2017-06-26 DIAGNOSIS — B20 Human immunodeficiency virus [HIV] disease: Secondary | ICD-10-CM | POA: Diagnosis not present

## 2017-06-26 DIAGNOSIS — G35 Multiple sclerosis: Secondary | ICD-10-CM | POA: Diagnosis not present

## 2017-07-05 DIAGNOSIS — Z21 Asymptomatic human immunodeficiency virus [HIV] infection status: Secondary | ICD-10-CM | POA: Diagnosis not present

## 2017-07-05 DIAGNOSIS — Z79899 Other long term (current) drug therapy: Secondary | ICD-10-CM | POA: Diagnosis not present

## 2017-07-05 DIAGNOSIS — E785 Hyperlipidemia, unspecified: Secondary | ICD-10-CM | POA: Diagnosis not present

## 2017-07-05 DIAGNOSIS — Z8546 Personal history of malignant neoplasm of prostate: Secondary | ICD-10-CM | POA: Diagnosis not present

## 2017-07-05 DIAGNOSIS — Z8669 Personal history of other diseases of the nervous system and sense organs: Secondary | ICD-10-CM | POA: Diagnosis not present

## 2017-07-05 DIAGNOSIS — H43811 Vitreous degeneration, right eye: Secondary | ICD-10-CM | POA: Diagnosis not present

## 2017-07-05 DIAGNOSIS — Z7982 Long term (current) use of aspirin: Secondary | ICD-10-CM | POA: Diagnosis not present

## 2017-07-05 DIAGNOSIS — I10 Essential (primary) hypertension: Secondary | ICD-10-CM | POA: Diagnosis not present

## 2017-07-09 ENCOUNTER — Encounter: Payer: Self-pay | Admitting: Family Medicine

## 2017-07-09 ENCOUNTER — Ambulatory Visit (INDEPENDENT_AMBULATORY_CARE_PROVIDER_SITE_OTHER): Payer: Medicare Other | Admitting: Family Medicine

## 2017-07-09 VITALS — BP 128/86 | Ht 68.0 in | Wt 212.2 lb

## 2017-07-09 DIAGNOSIS — M1000 Idiopathic gout, unspecified site: Secondary | ICD-10-CM

## 2017-07-09 DIAGNOSIS — K760 Fatty (change of) liver, not elsewhere classified: Secondary | ICD-10-CM | POA: Diagnosis not present

## 2017-07-09 DIAGNOSIS — Z13220 Encounter for screening for lipoid disorders: Secondary | ICD-10-CM

## 2017-07-09 DIAGNOSIS — G35 Multiple sclerosis: Secondary | ICD-10-CM

## 2017-07-09 DIAGNOSIS — I10 Essential (primary) hypertension: Secondary | ICD-10-CM

## 2017-07-09 DIAGNOSIS — E119 Type 2 diabetes mellitus without complications: Secondary | ICD-10-CM | POA: Diagnosis not present

## 2017-07-09 NOTE — Patient Instructions (Signed)
Do your labs first week of march  Bring all your meddicines with you

## 2017-07-09 NOTE — Progress Notes (Signed)
   Subjective:    Patient ID: Kerry Solis, male    DOB: [birth year], 72 y.o.   MRN: [id number]  HPI Patient arrives with c/o right little finer pain and numbness for one month. Small finger intermittently having pain intermittently with numbness no weakness in the hand no pain or discomfort down the arm patient does have MS also HIV and disc disease.  Patient denies hands falling asleep at night they do not fall asleep when he is doing activity denies any injury PMH benign  Review of Systems Please see above.  No fever chills sweats no cough wheezing difficulty breathing    Objective:   Physical Exam  Lungs clear heart regular pulse normal BP good reflexes good strength is good finger to nose normal Romberg negative minimal tremor walks without difficulty subjective numbness in the distal portion of his right small finger     Assessment & Plan:  This area seems to be getting better according to the patient more than likely related to [place] but it could be a nerve impingement if he is not improving by the time he follows up we will refer him for nerve conduction study patient was advised should this area get worse follow-up 6 weeks-patient to do lab work before comprehensive visit

## 2017-07-13 DIAGNOSIS — I10 Essential (primary) hypertension: Secondary | ICD-10-CM | POA: Diagnosis not present

## 2017-07-13 DIAGNOSIS — M1000 Idiopathic gout, unspecified site: Secondary | ICD-10-CM | POA: Diagnosis not present

## 2017-07-13 DIAGNOSIS — G35 Multiple sclerosis: Secondary | ICD-10-CM | POA: Diagnosis not present

## 2017-07-13 DIAGNOSIS — K760 Fatty (change of) liver, not elsewhere classified: Secondary | ICD-10-CM | POA: Diagnosis not present

## 2017-07-13 DIAGNOSIS — Z13220 Encounter for screening for lipoid disorders: Secondary | ICD-10-CM | POA: Diagnosis not present

## 2017-07-13 DIAGNOSIS — E119 Type 2 diabetes mellitus without complications: Secondary | ICD-10-CM | POA: Diagnosis not present

## 2017-07-14 LAB — CBC WITH DIFFERENTIAL/PLATELET
Basophils Absolute: 0 10*3/uL (ref 0.0–0.2)
Basos: 0 %
EOS (ABSOLUTE): 0 10*3/uL (ref 0.0–0.4)
Eos: 1 %
Hematocrit: 41.7 % (ref 37.5–51.0)
Hemoglobin: 13.6 g/dL (ref 13.0–17.7)
Immature Grans (Abs): 0 10*3/uL (ref 0.0–0.1)
Immature Granulocytes: 0 %
Lymphocytes Absolute: 1.2 10*3/uL (ref 0.7–3.1)
Lymphs: 41 %
MCH: 27.6 pg (ref 26.6–33.0)
MCHC: 32.6 g/dL (ref 31.5–35.7)
MCV: 85 fL (ref 79–97)
Monocytes Absolute: 0.5 10*3/uL (ref 0.1–0.9)
Monocytes: 17 %
Neutrophils Absolute: 1.2 10*3/uL — ABNORMAL LOW (ref 1.4–7.0)
Neutrophils: 41 %
Platelets: 215 10*3/uL (ref 150–379)
RBC: 4.93 x10E6/uL (ref 4.14–5.80)
RDW: 13.7 % (ref 12.3–15.4)
WBC: 2.8 10*3/uL — ABNORMAL LOW (ref 3.4–10.8)

## 2017-07-14 LAB — LIPID PANEL
Chol/HDL Ratio: 4.3 ratio (ref 0.0–5.0)
Cholesterol, Total: 139 mg/dL (ref 100–199)
HDL: 32 mg/dL — ABNORMAL LOW (ref >39)
LDL Calculated: 88 mg/dL (ref 0–99)
Triglycerides: 95 mg/dL (ref 0–149)
VLDL Cholesterol Cal: 19 mg/dL (ref 5–40)

## 2017-07-14 LAB — HEPATIC FUNCTION PANEL
ALT: 35 IU/L (ref 0–44)
AST: 36 IU/L (ref 0–40)
Albumin: 4.5 g/dL (ref 3.5–4.8)
Alkaline Phosphatase: 65 IU/L (ref 39–117)
Bilirubin Total: 0.3 mg/dL (ref 0.0–1.2)
Bilirubin, Direct: 0.11 mg/dL (ref 0.00–0.40)
Total Protein: 7.6 g/dL (ref 6.0–8.5)

## 2017-07-14 LAB — BASIC METABOLIC PANEL
BUN/Creatinine Ratio: 12 (ref 10–24)
BUN: 18 mg/dL (ref 8–27)
CO2: 24 mmol/L (ref 20–29)
Calcium: 9.8 mg/dL (ref 8.6–10.2)
Chloride: 106 mmol/L (ref 96–106)
Creatinine, Ser: 1.51 mg/dL — ABNORMAL HIGH (ref 0.76–1.27)
GFR calc Af Amer: 52 mL/min/{1.73_m2} — ABNORMAL LOW (ref >59)
GFR calc non Af Amer: 45 mL/min/{1.73_m2} — ABNORMAL LOW (ref >59)
Glucose: 103 mg/dL — ABNORMAL HIGH (ref 65–99)
Potassium: 4.5 mmol/L (ref 3.5–5.2)
Sodium: 145 mmol/L — ABNORMAL HIGH (ref 134–144)

## 2017-07-14 LAB — URIC ACID: Uric Acid: 8.9 mg/dL — ABNORMAL HIGH (ref 3.7–8.6)

## 2017-07-25 ENCOUNTER — Ambulatory Visit: Payer: Medicare Other | Admitting: Gastroenterology

## 2017-07-29 DIAGNOSIS — G35 Multiple sclerosis: Secondary | ICD-10-CM | POA: Diagnosis not present

## 2017-07-29 DIAGNOSIS — H40001 Preglaucoma, unspecified, right eye: Secondary | ICD-10-CM | POA: Diagnosis not present

## 2017-07-29 DIAGNOSIS — H53412 Scotoma involving central area, left eye: Secondary | ICD-10-CM | POA: Diagnosis not present

## 2017-07-29 DIAGNOSIS — Z8669 Personal history of other diseases of the nervous system and sense organs: Secondary | ICD-10-CM | POA: Diagnosis not present

## 2017-07-29 DIAGNOSIS — H2513 Age-related nuclear cataract, bilateral: Secondary | ICD-10-CM | POA: Diagnosis not present

## 2017-07-29 DIAGNOSIS — H401124 Primary open-angle glaucoma, left eye, indeterminate stage: Secondary | ICD-10-CM | POA: Diagnosis not present

## 2017-07-29 DIAGNOSIS — H47292 Other optic atrophy, left eye: Secondary | ICD-10-CM | POA: Diagnosis not present

## 2017-07-29 DIAGNOSIS — H43811 Vitreous degeneration, right eye: Secondary | ICD-10-CM | POA: Diagnosis not present

## 2017-08-14 ENCOUNTER — Ambulatory Visit (INDEPENDENT_AMBULATORY_CARE_PROVIDER_SITE_OTHER): Payer: Medicare Other | Admitting: Gastroenterology

## 2017-08-14 ENCOUNTER — Encounter: Payer: Self-pay | Admitting: Gastroenterology

## 2017-08-14 VITALS — BP 155/89 | HR 103 | Temp 97.0°F | Ht 68.0 in | Wt 217.8 lb

## 2017-08-14 DIAGNOSIS — K869 Disease of pancreas, unspecified: Secondary | ICD-10-CM

## 2017-08-14 DIAGNOSIS — K760 Fatty (change of) liver, not elsewhere classified: Secondary | ICD-10-CM | POA: Diagnosis not present

## 2017-08-14 NOTE — Patient Instructions (Signed)
I am glad you are doing well!   We will call you next week to arrange the MRI.  We will see you in 6-8 months!  It was a pleasure to see you today. I strive to create trusting relationships with patients to provide genuine, compassionate, and quality care. I value your feedback. If you receive a survey regarding your visit,  I greatly appreciate you taking time to fill this out.   Annitta Needs, PhD, ANP-BC [facility]

## 2017-08-14 NOTE — Progress Notes (Signed)
cc'ed to pcp °

## 2017-08-14 NOTE — Assessment & Plan Note (Signed)
Non-specific area of pancreas on CT. MRI to be arranged. Doubt concerning process but need to assess further.

## 2017-08-14 NOTE — Assessment & Plan Note (Signed)
Transaminases completely normalized. Has employed diet/behavior modifications. Statin has been on hold. No need for further serologies unless this fluctuates again or other concerning features. Return in 6-8 months.

## 2017-08-14 NOTE — Progress Notes (Signed)
Referring Provider: Kathyrn Drown, MD Primary Care Physician:  Kathyrn Drown, MD Primary GI: Dr. Oneida Alar   Chief Complaint  Patient presents with  . elevated transaminase level    HPI:   Kerry Solis is a 72 y.o. male presenting today with a history of dysphagia s/p EGD Feb 2018 with dilation of Schatzki's ring. He has a history of fatty liver with transaminases 2 times upper limits of normal.  US abdomen Oct 2018 within normal liver. CT abdomen Mar 19, 2017 with small area within the pancreas of uncertain significance. Statin has been stopped as of Mar 27, 2017.Negative Hep B and C. MRI pancreas ordered. LFTs normal Jan 2019. Colonoscopy due in 2021.   Protonix once per day. No abdominal pain, rectal bleeding, N/V. No dysphagia. Trying to eat healthier. Loves veggies. Limited meats. Walking with his dog. Visiting his daughter this weekend in [place]. Would like to set up MRI but unable to do so until he is able to look at his schedule.    Past Medical History:  Diagnosis Date  . Anxiety   . Arthritis   . 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 Depression   . Elevated liver enzymes   . Glaucoma   . High triglycerides   . History of gout   . HIV (human immunodeficiency virus infection) ([place])   . Hypertension   . Leukopenia    Low CD4  . Multiple sclerosis ([place])    uses a cane  . Myocardial infarction ([place])    45 years ago  . Prostate cancer [facility])     Past Surgical History:  Procedure Laterality Date  . BIOPSY  08/14/2016   Procedure: BIOPSY;  Surgeon: Danie Binder, MD;  Location: [facility];  Service: Endoscopy;;  gastric bx's  . CHOLECYSTECTOMY  06/10/2011   Procedure: LAPAROSCOPIC CHOLECYSTECTOMY;  Surgeon: Donato Heinz, MD;  Location: [facility] ORS;  Service: General;  Laterality: N/A;  . COLONOSCOPY  03/10/2009   XBD:[id number] internal hemorrhoids/6-mm sessile ascending colon polyp/tortuous colon, diverticulosis. TA  . COLONOSCOPY WITH  PROPOFOL N/A 04/19/2015   Dr. Oneida Alar: sessile serrated adenoma, surveillance in 5 years   . ESOPHAGOGASTRODUODENOSCOPY  06/23/2008   AST:[id number] gastritis/schatzki ring  . ESOPHAGOGASTRODUODENOSCOPY (EGD) WITH PROPOFOL N/A 08/14/2016   Dr. Oneida Alar: moderate Schatzi's ring s/p dilation, LA Grade A esophagitis,small hiatal hernia, chronic gastritis (negative H.pylori), one duodenal diverticulum  . NECK SURGERY  unsure   disc  . POLYPECTOMY N/A 04/19/2015   Procedure: POLYPECTOMY;  Surgeon: Danie Binder, MD;  Location: [facility] ORS;  Service: Endoscopy;  Laterality: N/A;  ascending colon  . PROSTATE BIOPSY    . RADIOACTIVE SEED IMPLANT N/A 11/29/2016   Procedure: RADIOACTIVE SEED IMPLANT/BRACHYTHERAPY IMPLANT;  Surgeon: Franchot Gallo, MD;  [facility];  Service: Urology;  Laterality: N/A;  81 seeds implanted  . SAVORY DILATION N/A 08/14/2016   Procedure: SAVORY DILATION;  Surgeon: Danie Binder, MD;  Location: [facility];  Service: Endoscopy;  Laterality: N/A;    Current Outpatient Medications  Medication Sig Dispense Refill  . acetaminophen (TYLENOL) 500 MG tablet Take 1,000 mg by mouth 3 (three) times daily as needed for mild pain or moderate pain.    Marland Kitchen allopurinol (ZYLOPRIM) 100 MG tablet TAKE (1) TABLET BY MOUTH ONCE DAILY FOR GOUT. 30 tablet 6  . amitriptyline (ELAVIL) 25 MG tablet TAKE TWO TABLETS BY MOUTH DAILY AT BEDTIME  9  .  amLODipine (NORVASC) 10 MG tablet TAKE (1) TABLET BY MOUTH DAILY FOR HIGH BLOOD PRESSURE. 30 tablet 6  . amLODipine (NORVASC) 5 MG tablet TAKE (1) TABLET BY MOUTH DAILY FOR HIGH BLOOD PRESSURE. 30 tablet 6  . aspirin EC 81 MG tablet Take 81 mg by mouth daily.    . carbamazepine (TEGRETOL) 100 MG chewable tablet Chew daily by mouth.     . chlorzoxazone (PARAFON) 500 MG tablet Take by mouth 4 (four) times daily as needed for muscle spasms.    . dolutegravir (TIVICAY) 50 MG tablet TAKE 1 TABLET BY MOUTH EVERY DAY. STOP ATRIPLA    . folic  acid (FOLVITE) 1 MG tablet Take 1 mg by mouth daily.    . interferon beta-1a (REBIF) 44 MCG/0.5ML SOSY injection Inject 44 mcg into the skin 3 (three) times a week.    . latanoprost (XALATAN) 0.005 % ophthalmic solution Place 1 drop at bedtime into both eyes.     Marland Kitchen lisinopril-hydrochlorothiazide (PRINZIDE,ZESTORETIC) 10-12.5 MG tablet TAKE (1) TABLET BY MOUTH DAILY FOR HIGH BLOOD PRESSURE. 30 tablet 5  . oxyCODONE (ROXICODONE) 5 MG immediate release tablet Take 1 tablet (5 mg total) by mouth every 6 (six) hours as needed for severe pain. 40 tablet 0  . pantoprazole (PROTONIX) 40 MG tablet TAKE 1 TABLET BY MOUTH ONCE DAILY. 90 tablet 1  . polyethylene glycol powder (GLYCOLAX/MIRALAX) powder Take 17 g by mouth daily. (Patient taking differently: Take 17 g by mouth as needed. ) 3350 g 5  . sertraline (ZOLOFT) 100 MG tablet TAKE 1 TABLET BY MOUTH ONCE DAILY. 30 tablet 6  . traMADol (ULTRAM) 50 MG tablet Take 1 tablet (50 mg total) by mouth every 6 (six) hours as needed. 15 tablet 0   No current facility-administered medications for this visit.     Allergies as of 08/14/2017 - Review Complete 08/14/2017  Allergen Reaction Noted  . Yellow jacket venom Swelling 03/14/2016    Family History  Problem Relation Age of Onset  . Hypertension Mother   . Heart attack Mother   . Heart attack Father   . Cancer Sister        Breast  . Diabetes Sister   . Diabetes Brother   . Cancer Brother        unknown  . Cancer Brother        colon  . Cancer Sister        breast  . Cancer Brother        prostate  . Cancer Brother        colon    Social History   Socioeconomic History  . Marital status: Divorced    Spouse name: None  . Number of children: None  . Years of education: None  . Highest education level: None  Social Needs  . Financial resource strain: None  . Food insecurity - worry: None  . Food insecurity - inability: None  . Transportation needs - medical: None  . Transportation  needs - non-medical: None  Occupational History  . None  Tobacco Use  . Smoking status: Never Smoker  . Smokeless tobacco: Never Used  Substance and Sexual Activity  . Alcohol use: No  . Drug use: No  . Sexual activity: No    Birth control/protection: Abstinence  Other Topics Concern  . None  Social History Narrative  . None    Review of Systems: Gen: Denies fever, chills, anorexia. Denies fatigue, weakness, weight loss.  CV: Denies chest pain, palpitations,  syncope, peripheral edema, and claudication. Resp: Denies dyspnea at rest, cough, wheezing, coughing up blood, and pleurisy. GI: see HPI  Derm: Denies rash, itching, dry skin Psych: Denies depression, anxiety, memory loss, confusion. No homicidal or suicidal ideation.  Heme: Denies bruising, bleeding, and enlarged lymph nodes.  Physical Exam: BP (!) 155/89   Pulse (!) 103   Temp (!) 97 F (36.1 C) (Oral)   Ht 5\' 8"  (1.727 m)   Wt 217 lb 12.8 oz (98.8 kg)   BMI 33.12 kg/m  General:   Alert and oriented. No distress noted. Pleasant and cooperative.  Head:  Normocephalic and atraumatic. Eyes:  Conjuctiva clear without scleral icterus. Mouth:  Oral mucosa pink and moist.  Abdomen:  +BS, soft, non-tender and non-distended. No rebound or guarding. No HSM or masses noted. Msk:  Symmetrical without gross deformities. Normal posture. Extremities:  Without edema. Neurologic:  Alert and  oriented x4 Psych:  Alert and cooperative. Normal mood and affect.  Lab Results  Component Value Date   ALT 35 07/13/2017   AST 36 07/13/2017   ALKPHOS 65 07/13/2017   BILITOT 0.3 07/13/2017

## 2017-08-21 DIAGNOSIS — B20 Human immunodeficiency virus [HIV] disease: Secondary | ICD-10-CM | POA: Diagnosis not present

## 2017-08-21 DIAGNOSIS — Z21 Asymptomatic human immunodeficiency virus [HIV] infection status: Secondary | ICD-10-CM | POA: Diagnosis not present

## 2017-08-21 DIAGNOSIS — G35 Multiple sclerosis: Secondary | ICD-10-CM | POA: Diagnosis not present

## 2017-08-21 DIAGNOSIS — I10 Essential (primary) hypertension: Secondary | ICD-10-CM | POA: Diagnosis not present

## 2017-08-21 DIAGNOSIS — Z79899 Other long term (current) drug therapy: Secondary | ICD-10-CM | POA: Diagnosis not present

## 2017-08-21 DIAGNOSIS — E785 Hyperlipidemia, unspecified: Secondary | ICD-10-CM | POA: Diagnosis not present

## 2017-08-27 ENCOUNTER — Encounter: Payer: Self-pay | Admitting: Family Medicine

## 2017-08-27 ENCOUNTER — Ambulatory Visit (INDEPENDENT_AMBULATORY_CARE_PROVIDER_SITE_OTHER): Payer: Medicare Other | Admitting: Family Medicine

## 2017-08-27 VITALS — BP 140/80 | Ht 68.0 in | Wt 216.4 lb

## 2017-08-27 DIAGNOSIS — I10 Essential (primary) hypertension: Secondary | ICD-10-CM

## 2017-08-27 DIAGNOSIS — E7849 Other hyperlipidemia: Secondary | ICD-10-CM | POA: Diagnosis not present

## 2017-08-27 DIAGNOSIS — R270 Ataxia, unspecified: Secondary | ICD-10-CM | POA: Diagnosis not present

## 2017-08-27 DIAGNOSIS — M1000 Idiopathic gout, unspecified site: Secondary | ICD-10-CM

## 2017-08-27 NOTE — Progress Notes (Signed)
   Subjective:    Patient ID: Kerry Solis, male    DOB: 04-Jul-1945, 72 y.o.   MRN: [id number]  Hypertension  This is a chronic problem. The current episode started more than 1 year ago. Pertinent negatives include no chest pain, headaches or shortness of breath. Risk factors for coronary artery disease include diabetes mellitus, dyslipidemia and male gender. Treatments tried: norvasc. There are no compliance problems.    Recent lab work shows good liver profile uric acid slightly up but he denies any gout flareups also has history of hyperlipidemia and elevated liver enzymes but recent lab work shows liver enzymes normal   Review of Systems  Constitutional: Negative for activity change, fatigue and fever.  HENT: Negative for congestion and rhinorrhea.   Respiratory: Negative for cough and shortness of breath.   Cardiovascular: Negative for chest pain and leg swelling.  Gastrointestinal: Negative for abdominal pain, diarrhea and nausea.  Genitourinary: Negative for dysuria and hematuria.  Neurological: Negative for weakness and headaches.  Psychiatric/Behavioral: Negative for behavioral problems.       Objective:   Physical Exam  Constitutional: He appears well-nourished. No distress.  HENT:  Head: Normocephalic and atraumatic.  Eyes: Right eye exhibits no discharge. Left eye exhibits no discharge.  Neck: No tracheal deviation present.  Cardiovascular: Normal rate, regular rhythm and normal heart sounds.  No murmur heard. Pulmonary/Chest: Effort normal and breath sounds normal. No respiratory distress. He has no wheezes.  Musculoskeletal: He exhibits no edema.  Lymphadenopathy:    He has no cervical adenopathy.  Neurological: He is alert.  Skin: Skin is warm. No rash noted.  Psychiatric: His behavior is normal.  Vitals reviewed.         Assessment & Plan:  Liver enzymes are doing well off of medication hold pravastatin recheck lipid liver profile in several months if  this is gone at up on the liver enzymes will need to intervene if cholesterol goes up may need to restart pravastatin  Patient off of allopurinol recheck uric acid in 4 months if any gout flareups to notify us  Follow-up 4 months

## 2017-08-28 ENCOUNTER — Telehealth: Payer: Self-pay | Admitting: *Deleted

## 2017-08-28 ENCOUNTER — Encounter: Payer: Self-pay | Admitting: Family Medicine

## 2017-08-28 ENCOUNTER — Other Ambulatory Visit: Payer: Self-pay | Admitting: Family Medicine

## 2017-08-28 DIAGNOSIS — K760 Fatty (change of) liver, not elsewhere classified: Secondary | ICD-10-CM

## 2017-08-28 DIAGNOSIS — E7849 Other hyperlipidemia: Secondary | ICD-10-CM

## 2017-08-28 DIAGNOSIS — E1169 Type 2 diabetes mellitus with other specified complication: Secondary | ICD-10-CM

## 2017-08-28 LAB — HEPATIC FUNCTION PANEL
ALT: 60 IU/L — ABNORMAL HIGH (ref 0–44)
AST: 70 IU/L — ABNORMAL HIGH (ref 0–40)
Albumin: 4.7 g/dL (ref 3.5–4.8)
Alkaline Phosphatase: 68 IU/L (ref 39–117)
Bilirubin Total: 0.3 mg/dL (ref 0.0–1.2)
Bilirubin, Direct: 0.13 mg/dL (ref 0.00–0.40)
Total Protein: 8.2 g/dL (ref 6.0–8.5)

## 2017-08-28 LAB — URIC ACID: Uric Acid: 9.9 mg/dL — ABNORMAL HIGH (ref 3.7–8.6)

## 2017-08-28 LAB — LIPID PANEL
Chol/HDL Ratio: 6.2 ratio — ABNORMAL HIGH (ref 0.0–5.0)
Cholesterol, Total: 203 mg/dL — ABNORMAL HIGH (ref 100–199)
HDL: 33 mg/dL — ABNORMAL LOW (ref >39)
LDL Calculated: 139 mg/dL — ABNORMAL HIGH (ref 0–99)
Triglycerides: 153 mg/dL — ABNORMAL HIGH (ref 0–149)
VLDL Cholesterol Cal: 31 mg/dL (ref 5–40)

## 2017-08-28 NOTE — Telephone Encounter (Signed)
Patient was suppose to have MRI (order in epic) scheduled at last OV but advised he would call back bc he did not have his calendar. We have not heard back from pt.  LMOVM.

## 2017-08-28 NOTE — Telephone Encounter (Signed)
lmovm

## 2017-08-29 ENCOUNTER — Telehealth: Payer: Self-pay | Admitting: *Deleted

## 2017-08-29 DIAGNOSIS — E785 Hyperlipidemia, unspecified: Secondary | ICD-10-CM

## 2017-08-29 DIAGNOSIS — M109 Gout, unspecified: Secondary | ICD-10-CM

## 2017-08-29 DIAGNOSIS — Z79899 Other long term (current) drug therapy: Secondary | ICD-10-CM

## 2017-08-29 MED ORDER — ALLOPURINOL 100 MG PO TABS: 100.0000 mg | ORAL_TABLET | Freq: Every day | ORAL | 5 refills | Status: DC

## 2017-08-29 MED ORDER — PRAVASTATIN SODIUM 20 MG PO TABS: 20.0000 mg | ORAL_TABLET | Freq: Every day | ORAL | 5 refills | Status: DC

## 2017-08-29 NOTE — Telephone Encounter (Signed)
Blood work ordered in [employer]. Prescriptions sent electronically to pharmacy.

## 2017-08-29 NOTE — Telephone Encounter (Signed)
Letter mailed to patient.

## 2017-09-02 ENCOUNTER — Telehealth: Payer: Self-pay | Admitting: Gastroenterology

## 2017-09-02 NOTE — Telephone Encounter (Signed)
Pt received a letter that we were unable to reach him by phone and needed to schedule his MRI. Please call him back at [phone]

## 2017-09-02 NOTE — Telephone Encounter (Signed)
MRI scheduled for 09/10/17 at 9:00am, arrival time 8:45am, npo 4 hours prior. Patient aware of appt details. Nothing further needed

## 2017-09-05 ENCOUNTER — Other Ambulatory Visit: Payer: Self-pay

## 2017-09-05 ENCOUNTER — Ambulatory Visit (HOSPITAL_COMMUNITY): Payer: Medicare Other | Attending: Family Medicine | Admitting: Physical Therapy

## 2017-09-05 ENCOUNTER — Encounter (HOSPITAL_COMMUNITY): Payer: Self-pay | Admitting: Physical Therapy

## 2017-09-05 DIAGNOSIS — M6281 Muscle weakness (generalized): Secondary | ICD-10-CM | POA: Diagnosis not present

## 2017-09-05 DIAGNOSIS — R296 Repeated falls: Secondary | ICD-10-CM | POA: Diagnosis not present

## 2017-09-05 DIAGNOSIS — R2689 Other abnormalities of gait and mobility: Secondary | ICD-10-CM | POA: Diagnosis not present

## 2017-09-05 NOTE — Therapy (Signed)
[place], [place], [address] Phone: [phone]   Fax:  [phone]  Physical Therapy Evaluation  Patient Details  Name: Kerry Solis MRN: [id number] Date of Birth: 1946/06/09 Referring Provider: Kathyrn Drown, MD   Encounter Date: 09/05/2017  PT End of Session - 09/05/17 1625    Visit Number  1    Number of Visits  17    Date for PT Re-Evaluation  10/31/17 Mini re-assess 10/03/17    Authorization Type  Medicare Part A and B, Secondary: [employer] [facility]     Authorization Time Period  09/05/17 to 10/31/17    PT Start Time  0950    PT Stop Time  1040    PT Time Calculation (min)  50 min    Equipment Utilized During Treatment  Gait belt    Activity Tolerance  Patient tolerated treatment well;Patient limited by fatigue    Behavior During Therapy  [facility] for tasks assessed/performed       Past Medical History:  Diagnosis Date  . Anxiety   . Arthritis   . 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 Depression   . Elevated liver enzymes   . Glaucoma   . High triglycerides   . History of gout   . HIV (human immunodeficiency virus infection) ([place])   . Hypertension   . Leukopenia    Low CD4  . Multiple sclerosis ([place])    uses a cane  . Myocardial infarction ([place])    45 years ago  . Prostate cancer [facility])     Past Surgical History:  Procedure Laterality Date  . BIOPSY  08/14/2016   Procedure: BIOPSY;  Surgeon: Danie Binder, MD;  Location: [facility];  Service: Endoscopy;;  gastric bx's  . CHOLECYSTECTOMY  06/10/2011   Procedure: LAPAROSCOPIC CHOLECYSTECTOMY;  Surgeon: Donato Heinz, MD;  Location: [facility] ORS;  Service: General;  Laterality: N/A;  . COLONOSCOPY  03/10/2009   ENI:DPOEUMPN internal hemorrhoids/6-mm sessile ascending colon polyp/tortuous colon, diverticulosis. TA  . COLONOSCOPY WITH PROPOFOL N/A 04/19/2015   Dr. Oneida Alar: sessile serrated adenoma, surveillance in 5 years    . ESOPHAGOGASTRODUODENOSCOPY  06/23/2008   TIR:WERXVQM gastritis/schatzki ring  . ESOPHAGOGASTRODUODENOSCOPY (EGD) WITH PROPOFOL N/A 08/14/2016   Dr. Oneida Alar: moderate Schatzi's ring s/p dilation, LA Grade A esophagitis,small hiatal hernia, chronic gastritis (negative H.pylori), one duodenal diverticulum  . NECK SURGERY  unsure   disc  . POLYPECTOMY N/A 04/19/2015   Procedure: POLYPECTOMY;  Surgeon: Danie Binder, MD;  Location: [facility] ORS;  Service: Endoscopy;  Laterality: N/A;  ascending colon  . PROSTATE BIOPSY    . RADIOACTIVE SEED IMPLANT N/A 11/29/2016   Procedure: RADIOACTIVE SEED IMPLANT/BRACHYTHERAPY IMPLANT;  Surgeon: Franchot Gallo, MD;  Location: [facility];  Service: Urology;  Laterality: N/A;  81 seeds implanted  . SAVORY DILATION N/A 08/14/2016   Procedure: SAVORY DILATION;  Surgeon: Danie Binder, MD;  Location[facility];  Service: Endoscopy;  Laterality: N/A;    There were no vitals filed for this visit.   Subjective Assessment - 09/05/17 0959    Subjective  Patient reported that his balance is off and he feels like his balance is getting worse. He reported that he has Multiple Sclerosis (MS) and he gets tired really easily. Patient reported that when he first stands up he has trouble standing. Patient reported that he has had MS for about 10 or  12 years. Patient reported that on his right side he has some tingling on the inside of his thigh to about his knee and on his right pinky and ring finger. Patient reported that he tries to do everything on his own. He stated that his niece will help with cleaning his house. Patient reported that his left eyesight is gone. Patient reported a history of prostate cancer which is in remission now. Patient stated that he uses his single point cane for everything, but has a manual wheelchair he only uses when he's at home and trying to clean something and can't stand for that long.     Pertinent History  MS; history of  prostate cancer in remission by patient report    Limitations  Standing;Walking;Lifting;House hold activities    How long can you sit comfortably?  Not a problem, until he stands up    How long can you stand comfortably?  20 minutes    How long can you walk comfortably?  Walking 80 feet to mailbox is tiring    Diagnostic tests  MRI Brain in 2016: Examining progression of lesions, showing no new lesions and no acute demyelination    Patient Stated Goals  To improve balance    Currently in Pain?  No/denies    Multiple Pain Sites  No         [facility] PT Assessment - 09/05/17 0001      Assessment   Medical Diagnosis  Ataxia    Referring Provider  Kathyrn Drown, MD    Onset Date/Surgical Date  -- MS for 10-12 years    Prior Therapy  -- Therapy for a broken leg, but not for ataxia      Balance Screen   Has the patient fallen in the past 6 months  Yes    How many times?  2    Has the patient had a decrease in activity level because of a fear of falling?   Yes    Is the patient reluctant to leave their home because of a fear of falling?   Yes      [place]  Private residence    Living Arrangements  Alone    Type of [place] entrance    [place] or work area in basement    Additional Comments  13 stairs to go down to basement on left side      Prior Function   Level of Independence  Independent;Independent with basic ADLs    Vocation  On disability    Leisure  Walk his dog      Cognition   Overall Cognitive Status  Within Functional Limits for tasks assessed      Observation/Other Assessments   Focus on Therapeutic Outcomes (FOTO)   26% (74% limitation)      Sensation   Light Touch  Impaired by gross assessment    Additional Comments  Patient reported numbness with light touch testing in inside of right thigh. Patient reported numbness of right pinky and ring finger, but the upper extremities were not tested.        Strength   Right/Left Hip  Right;Left    Right Hip Flexion  4-/5    Right Hip Extension  4+/5    Right Hip ABduction  4/5    Left Hip Flexion  4/5    Left Hip Extension  4+/5  Left Hip ABduction  4+/5    Right/Left Knee  Right;Left    Right Knee Flexion  4+/5    Right Knee Extension  4+/5    Left Knee Flexion  4+/5    Left Knee Extension  4/5    Right/Left Ankle  Right;Left    Right Ankle Dorsiflexion  5/5    Left Ankle Dorsiflexion  5/5      Ambulation/Gait   Gait Comments  Patient was observed walking into and out of clinic uses single point cane. Demonstrated decreased knee flexion, decreased hip flexion, and ataxia.       Static Standing Balance   Static Standing - Balance Support  No upper extremity supported    Static Standing Balance -  Activities   Single Leg Stance - Right Leg;Single Leg Stance - Left Leg    Static Standing - Comment/# of Minutes  Single leg stance left lower extremity: 2 seconds; Single leg stance right lower extremity: 2 seconds      Standardized Balance Assessment   Five times sit to stand comments   Patient performed in 31.29 seconds from a chair without upper extremity assist      Functional Gait  Assessment   Gait Level Surface  Walks 20 ft, slow speed, abnormal gait pattern, evidence for imbalance or deviates 10-15 in outside of the 12 in walkway width. Requires more than 7 sec to ambulate 20 ft.    Change in Gait Speed  Able to change speed, demonstrates mild gait deviations, deviates 6-10 in outside of the 12 in walkway width, or no gait deviations, unable to achieve a major change in velocity, or uses a change in velocity, or uses an assistive device.    Gait with Horizontal Head Turns  Performs head turns with moderate changes in gait velocity, slows down, deviates 10-15 in outside 12 in walkway width but recovers, can continue to walk.    Gait with Vertical Head Turns  Performs task with severe disruption of gait (eg, staggers 15 in  outside 12 in walkway width, loses balance, stops, reaches for wall).    Gait and Pivot Turn  Pivot turns safely in greater than 3 sec and stops with no loss of balance, or pivot turns safely within 3 sec and stops with mild imbalance, requires small steps to catch balance.    Step Over Obstacle  Is able to step over one shoe box (4.5 in total height) but must slow down and adjust steps to clear box safely. May require verbal cueing.    Gait with Narrow Base of Support  Ambulates less than 4 steps heel to toe or cannot perform without assistance.    Gait with Eyes Closed  Walks 20 ft, slow speed, abnormal gait pattern, evidence for imbalance, deviates 10-15 in outside 12 in walkway width. Requires more than 9 sec to ambulate 20 ft.    Ambulating Backwards  Walks 20 ft, slow speed, abnormal gait pattern, evidence for imbalance, deviates 10-15 in outside 12 in walkway width.    Steps  Two feet to a stair, must use rail.    Total Score  9      RLE Tone   RLE Tone  Within Functional Limits Patellar and achilles reflex: 2+. Ankle clonus absent      LLE Tone   LLE Tone  Within Functional Limits Patellar and achilles reflex: 2+. Ankle clonus absent             Objective measurements completed  on examination: See above findings.              PT Education - 09/05/17 1625    Education provided  Yes    Education Details  Patient was educated on examination findings and plan of care.     Person(s) Educated  Patient    Methods  Explanation    Comprehension  Verbalized understanding       PT Short Term Goals - 09/05/17 1751      PT SHORT TERM GOAL #1   Title  Patient will demonstrate understanding and report regular compliance of home exercise program in order to improve patient's balance, safety, and overall functional mobility.     Time  4    Period  Weeks    Status  New    Target Date  10/03/17      PT SHORT TERM GOAL #2   Title  Patient will demonstrate an improvement of  1/2 MMT grade in all deficient tested musculature in order to assist with improved mechanics and safety with gait and stair ambulation.     Time  4    Period  Weeks    Status  New    Target Date  10/03/17      PT SHORT TERM GOAL #3   Title  Patient will be educated on RPE and RPE scale and report understanding in order for patient to better prevent fatigue with daily activites.     Time  4    Period  Weeks    Status  New    Target Date  10/03/17      PT SHORT TERM GOAL #4   Title  Patient will maintain single limb stance on each lower extremity for 5 seconds indicating an improvement in balance, and assist with stair ambulation.     Time  4    Period  Weeks    Status  New    Target Date  10/03/17        PT Long Term Goals - 09/05/17 1757      PT LONG TERM GOAL #1   Title  Patient will demonstrate improvement of 6 points on the Functional Gait assessment in order to indicate improved dynamic balance, improved safety, and decreased risk of falls.     Time  8    Period  Weeks    Status  New    Target Date  10/31/17      PT LONG TERM GOAL #2   Title  Patient will demonstrate an improvement of 1 MMT grade in all deficient tested musculature in order to assist with improved mechanics and safety with gait and stair ambulation.     Time  8    Period  Weeks    Status  New    Target Date  10/31/17      PT LONG TERM GOAL #3   Title  Patient will perform the 5 times sit to stand in 14.2 seconds or less indicating a decreased risk of falls.     Time  8    Period  Weeks    Status  New    Target Date  10/31/17             Plan - 09/05/17 1631    Clinical Impression Statement  Patient is a 72 year old male who presented to physical therapy for evaluation with complaints of increased difficulty with balance. Upon examination, patient demonstrated decreased lower extremity strength. Patient also  presented with significant deficits in balance and reported a history of falls.  Patient demonstrated single limb stance time of 2 seconds on each lower extremity. With the 5 times sit to stand patient performed in 31.29 seconds. Patient scored a 9 on the Functional Gait Assessment. Patient reported increased fatigue with functional activities at home and during functional gait assessment. Patient would benefit from skilled physical therapy in order to address the abovementioned deficits in order to improve his overall functional mobility, decrease his risk of falls, and improve his overall quality of life.     History and Personal Factors relevant to plan of care:  MS, Prostate cancer in remission, HTN, Diabetes    Clinical Presentation  Evolving    Clinical Presentation due to:  MMT, FOTO, 5xSTS, Functional gait assessment, clinical judgement    Clinical Decision Making  Moderate    Rehab Potential  Fair    Clinical Impairments Affecting Rehab Potential  Positive: Patient's motivation, family support. Negative: Progressive nature of MS    PT Frequency  2x / week    PT Duration  8 weeks    PT Treatment/Interventions  ADLs/Self Care Home Management;DME Instruction;Gait training;Stair training;Functional mobility training;Therapeutic activities;Therapeutic exercise;Balance training;Neuromuscular re-education;Patient/family education;Wheelchair mobility training;Manual techniques;Passive range of motion;Energy conservation;Taping;Visual/perceptual remediation/compensation    PT Next Visit Plan  Review Evaluation; Initiate Home exercise program including tandem stance and single limb stance at support surface; Educate patient on RPE scale for exertion and use throughout session; Initiate balance activities focusing on dynamic balance    PT Home Exercise Plan  Initiate at first treatment session    Consulted and Agree with Plan of Care  Patient       Patient will benefit from skilled therapeutic intervention in order to improve the following deficits and impairments:  Abnormal gait,  Decreased balance, Decreased endurance, Decreased mobility, Difficulty walking, Impaired sensation, Impaired vision/preception, Improper body mechanics, Decreased activity tolerance, Decreased coordination, Decreased strength  Visit Diagnosis: Other abnormalities of gait and mobility  Muscle weakness (generalized)  Repeated falls     Problem List Patient Active Problem List   Diagnosis Date Noted  . Lesion of pancreas 04/24/2017  . Gastritis due to nonsteroidal anti-inflammatory drug (NSAID)   . Dysphagia 07/26/2016  . Prostate cancer ([place]) 06/27/2016  . Fall at home 03/15/2016  . Tibial plateau fracture, right 03/15/2016  . Inability to walk 03/15/2016  . Right medial tibial plateau fracture 03/15/2016  . Multiple sclerosis ([place])   . Fall   . Diabetes type 2, controlled ([place]) 10/05/2015  . Gout 10/05/2015  . Fatty liver 06/24/2015  . Hx of adenomatous colonic polyps 03/31/2015  . FH: colon cancer 03/31/2015  . Prediabetes 12/04/2013  . Hyperlipemia 03/11/2013  . Hyperglycemia 03/11/2013  . Stress fracture 01/14/2013  . Knee pain, chronic 01/14/2013  . RECTAL BLEEDING 02/15/2009  . HIV INFECTION 02/14/2009  . UNSPECIFIED VISUAL LOSS 02/14/2009  . Essential hypertension 02/14/2009  . CONSTIPATION 02/14/2009  . ABDOMINAL PAIN 02/14/2009  . Elevated transaminase level 02/14/2009  . HX OF GALLSTONE 02/14/2009   Clarene Critchley PT, DPT 6:05 PM, 09/05/17 [place], [place], [address] Phone: [phone]   Fax:  [phone]  Name: Kerry Solis MRN: [id number] Date of Birth: [birth year]

## 2017-09-10 ENCOUNTER — Other Ambulatory Visit: Payer: Self-pay | Admitting: Gastroenterology

## 2017-09-10 ENCOUNTER — Encounter (HOSPITAL_COMMUNITY): Payer: Self-pay

## 2017-09-10 ENCOUNTER — Ambulatory Visit (HOSPITAL_COMMUNITY)
Admission: RE | Admit: 2017-09-10 | Discharge: 2017-09-10 | Disposition: A | Discharge status: Home / Self Care | Payer: Medicare Other | Source: Ambulatory Visit | Attending: Gastroenterology | Admitting: Gastroenterology

## 2017-09-10 ENCOUNTER — Other Ambulatory Visit: Payer: Self-pay

## 2017-09-10 ENCOUNTER — Ambulatory Visit (HOSPITAL_COMMUNITY): Payer: Medicare Other

## 2017-09-10 DIAGNOSIS — R296 Repeated falls: Secondary | ICD-10-CM | POA: Diagnosis not present

## 2017-09-10 DIAGNOSIS — K760 Fatty (change of) liver, not elsewhere classified: Secondary | ICD-10-CM | POA: Insufficient documentation

## 2017-09-10 DIAGNOSIS — R932 Abnormal findings on diagnostic imaging of liver and biliary tract: Secondary | ICD-10-CM | POA: Diagnosis not present

## 2017-09-10 DIAGNOSIS — R2689 Other abnormalities of gait and mobility: Secondary | ICD-10-CM | POA: Diagnosis not present

## 2017-09-10 DIAGNOSIS — R945 Abnormal results of liver function studies: Secondary | ICD-10-CM | POA: Insufficient documentation

## 2017-09-10 DIAGNOSIS — M6281 Muscle weakness (generalized): Secondary | ICD-10-CM

## 2017-09-10 LAB — POCT I-STAT, CHEM 8
BUN: 19 mg/dL (ref 6–20)
Calcium, Ion: 1.27 mmol/L (ref 1.15–1.40)
Chloride: 108 mmol/L (ref 101–111)
Creatinine, Ser: 1.5 mg/dL — ABNORMAL HIGH (ref 0.61–1.24)
Glucose, Bld: 110 mg/dL — ABNORMAL HIGH (ref 65–99)
HCT: 47 % (ref 39.0–52.0)
Hemoglobin: 16 g/dL (ref 13.0–17.0)
Potassium: 3.6 mmol/L (ref 3.5–5.1)
Sodium: 146 mmol/L — ABNORMAL HIGH (ref 135–145)
TCO2: 24 mmol/L (ref 22–32)

## 2017-09-10 MED ORDER — GADOBENATE DIMEGLUMINE 529 MG/ML IV SOLN
20.0000 mL | Freq: Once | INTRAVENOUS | Status: AC | PRN
  Administered 2017-09-10: 20 mL via INTRAVENOUS

## 2017-09-10 NOTE — Therapy (Signed)
Kerry Solis, [place], [address] Phone: [phone]   Fax:  [phone]  Physical Therapy Treatment  Patient Details  Name: Kerry Solis MRN: [id number] Date of Birth: 17-Feb-1946 Referring Provider: Kathyrn Drown, MD   Encounter Date: 09/10/2017  PT End of Session - 09/10/17 1635    Visit Number  2    Number of Visits  17    Date for PT Re-Evaluation  10/31/17 Mini re-assess 10/03/17    Authorization Type  Medicare Part A and B, Secondary: [employer] [facility]     Authorization Time Period  09/05/17 to 10/31/17    PT Start Time  1430    PT Stop Time  1516    PT Time Calculation (min)  46 min    Equipment Utilized During Treatment  Gait belt    Activity Tolerance  Patient tolerated treatment well;Patient limited by fatigue    Behavior During Therapy  [facility] for tasks assessed/performed       Past Medical History:  Diagnosis Date  . Anxiety   . Arthritis   . 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 Depression   . Elevated liver enzymes   . Glaucoma   . High triglycerides   . History of gout   . HIV (human immunodeficiency virus infection) ([place])   . Hypertension   . Leukopenia    Low CD4  . Multiple sclerosis ([place])    uses a cane  . Myocardial infarction ([place])    45 years ago  . Prostate cancer [facility])     Past Surgical History:  Procedure Laterality Date  . BIOPSY  08/14/2016   Procedure: BIOPSY;  Surgeon: Danie Binder, MD;  Location[facility];  Service: Endoscopy;;  gastric bx's  . CHOLECYSTECTOMY  06/10/2011   Procedure: LAPAROSCOPIC CHOLECYSTECTOMY;  Surgeon: Donato Heinz, MD;  Location: [facility] ORS;  Service: General;  Laterality: N/A;  . COLONOSCOPY  03/10/2009   [facility]:[id number] internal hemorrhoids/6-mm sessile ascending colon polyp/tortuous colon, diverticulosis. TA  . COLONOSCOPY WITH PROPOFOL N/A 04/19/2015   Dr. Oneida Alar: sessile serrated adenoma, surveillance in 5 years    . ESOPHAGOGASTRODUODENOSCOPY  06/23/2008   [facility]:URKYHCW gastritis/schatzki ring  . ESOPHAGOGASTRODUODENOSCOPY (EGD) WITH PROPOFOL N/A 08/14/2016   Dr. Oneida Alar: moderate Schatzi's ring s/p dilation, LA Grade A esophagitis,small hiatal hernia, chronic gastritis (negative H.pylori), one duodenal diverticulum  . NECK SURGERY  unsure   disc  . POLYPECTOMY N/A 04/19/2015   Procedure: POLYPECTOMY;  Surgeon: Danie Binder, MD;  Location: [facility] ORS;  Service: Endoscopy;  Laterality: N/A;  ascending colon  . PROSTATE BIOPSY    . RADIOACTIVE SEED IMPLANT N/A 11/29/2016   Procedure: RADIOACTIVE SEED IMPLANT/BRACHYTHERAPY IMPLANT;  Surgeon: Franchot Gallo, MD;  Location[facility];  Service: Urology;  Laterality: N/A;  81 seeds implanted  . SAVORY DILATION N/A 08/14/2016   Procedure: SAVORY DILATION;  Surgeon: Danie Binder, MD;  [facility];  Service: Endoscopy;  Laterality: N/A;    There were no vitals filed for this visit.  Subjective Assessment - 09/10/17 1442    Subjective  Patient reports he is doing well today but is a little tired. He states, "I'm trying to do everything at once, I'm doing my teeth now", referring to his dentist appointment he is trying to get in for his follow-up appointment. Denies falls since his evaluation.     Pertinent History  MS;  history of prostate cancer in remission by patient report    Limitations  Standing;Walking;Lifting;House hold activities    How long can you sit comfortably?  Not a problem, until he stands up    How long can you stand comfortably?  20 minutes    How long can you walk comfortably?  Walking 80 feet to mailbox is tiring    Diagnostic tests  MRI Brain in 2016: Examining progression of lesions, showing no new lesions and no acute demyelination    Patient Stated Goals  To improve balance    Currently in Pain?  No/denies        No data recorded   [place] Adult PT Treatment/Exercise - 09/10/17 0001      Exercises    Exercises  Knee/Hip      Knee/Hip Exercises: Seated   Sit to Sand  5 reps;without UE support slow/controlled, patient using fwd trunk lean to initiate      Knee/Hip Exercises: Supine   Bridges  Both;2 sets;10 reps    Straight Leg Raises  1 set;Both;10 reps      Knee/Hip Exercises: Sidelying   Clams  2x 10 reps Bil LE        Balance Exercises - 09/10/17 1500      Balance Exercises: Standing   Tandem Stance  Eyes open;5 reps;10 secs Bil LE    Marching Limitations  3x 10 reps bil LE at counter, no UE support patietn fatgieud with decreased hip flexion bil throughotu x        PT Education - 09/10/17 1634    Education provided  Yes    Education Details  reveiwd eval and goals with patient and educated on exercises throughout session. Discussed importance of rest breakst o prevent over-fatigue and educated on Borg RPE scale and modified RPE scale to rate exertion level with exercises throughout session.     Person(s) Educated  Patient    Methods  Explanation;Tactile cues;Verbal cues;Handout    Comprehension  Verbalized understanding;Returned demonstration       PT Short Term Goals - 09/05/17 1751      PT SHORT TERM GOAL #1   Title  Patient will demonstrate understanding and report regular compliance of home exercise program in order to improve patient's balance, safety, and overall functional mobility.     Time  4    Period  Weeks    Status  New    Target Date  10/03/17      PT SHORT TERM GOAL #2   Title  Patient will demonstrate an improvement of 1/2 MMT grade in all deficient tested musculature in order to assist with improved mechanics and safety with gait and stair ambulation.     Time  4    Period  Weeks    Status  New    Target Date  10/03/17      PT SHORT TERM GOAL #3   Title  Patient will be educated on RPE and RPE scale and report understanding in order for patient to better prevent fatigue with daily activites.     Time  4    Period  Weeks    Status  New     Target Date  10/03/17      PT SHORT TERM GOAL #4   Title  Patient will maintain single limb stance on each lower extremity for 5 seconds indicating an improvement in balance, and assist with stair ambulation.     Time  4  Period  Weeks    Status  New    Target Date  10/03/17        PT Long Term Goals - 09/05/17 1757      PT LONG TERM GOAL #1   Title  Patient will demonstrate improvement of 6 points on the Functional Gait assessment in order to indicate improved dynamic balance, improved safety, and decreased risk of falls.     Time  8    Period  Weeks    Status  New    Target Date  10/31/17      PT LONG TERM GOAL #2   Title  Patient will demonstrate an improvement of 1 MMT grade in all deficient tested musculature in order to assist with improved mechanics and safety with gait and stair ambulation.     Time  8    Period  Weeks    Status  New    Target Date  10/31/17      PT LONG TERM GOAL #3   Title  Patient will perform the 5 times sit to stand in 14.2 seconds or less indicating a decreased risk of falls.     Time  8    Period  Weeks    Status  New    Target Date  10/31/17        Plan - 09/10/17 1635    Clinical Impression Statement  Reviewed eval and goals at start of session and initiated POC. Start of session focused on education of Borg RPE scale to monitor exertion/fatigue level throughout session and patient was educated on importance of rest breaks to prevent exhaustion. Initiated functional hip exercises for strengthening and balance exercises. Educated on initial HEP and on frequency to perform. Patient will continue to benefit from skilled PT services to progress in therapy and improve functional mobility/balance.     Rehab Potential  Fair    Clinical Impairments Affecting Rehab Potential  Positive: Patient's motivation, family support. Negative: Progressive nature of MS    PT Frequency  2x / week    PT Duration  8 weeks    PT Treatment/Interventions   ADLs/Self Care Home Management;DME Instruction;Gait training;Stair training;Functional mobility training;Therapeutic activities;Therapeutic exercise;Balance training;Neuromuscular re-education;Patient/family education;Wheelchair mobility training;Manual techniques;Passive range of motion;Energy conservation;Taping;Visual/perceptual remediation/compensation    PT Next Visit Plan  Add single limb stance at support surface to HEP; Review patient on RPE scale for exertion and use throughout session; Initiate balance activities focusing on dynamic balance.     PT Home Exercise Plan  09/10/17 - tandem at counter, bridge, clamshell    Consulted and Agree with Plan of Care  Patient       Patient will benefit from skilled therapeutic intervention in order to improve the following deficits and impairments:  Abnormal gait, Decreased balance, Decreased endurance, Decreased mobility, Difficulty walking, Impaired sensation, Impaired vision/preception, Improper body mechanics, Decreased activity tolerance, Decreased coordination, Decreased strength  Visit Diagnosis: Other abnormalities of gait and mobility  Muscle weakness (generalized)  Repeated falls     Problem List Patient Active Problem List   Diagnosis Date Noted  . Lesion of pancreas 04/24/2017  . Gastritis due to nonsteroidal anti-inflammatory drug (NSAID)   . Dysphagia 07/26/2016  . Prostate cancer ([place]) 06/27/2016  . Fall at home 03/15/2016  . Tibial plateau fracture, right 03/15/2016  . Inability to walk 03/15/2016  . Right medial tibial plateau fracture 03/15/2016  . Multiple sclerosis ([place])   . Fall   . Diabetes type 2, controlled ([place])  10/05/2015  . Gout 10/05/2015  . Fatty liver 06/24/2015  . Hx of adenomatous colonic polyps 03/31/2015  . FH: colon cancer 03/31/2015  . Prediabetes 12/04/2013  . Hyperlipemia 03/11/2013  . Hyperglycemia 03/11/2013  . Stress fracture 01/14/2013  . Knee pain, chronic 01/14/2013  . RECTAL BLEEDING  02/15/2009  . HIV INFECTION 02/14/2009  . UNSPECIFIED VISUAL LOSS 02/14/2009  . Essential hypertension 02/14/2009  . CONSTIPATION 02/14/2009  . ABDOMINAL PAIN 02/14/2009  . Elevated transaminase level 02/14/2009  . HX OF GALLSTONE 02/14/2009    Kipp Brood, PT, DPT Physical Therapist with [facility]  09/10/2017 4:45 PM    [facility] [address] [place], [place], [address] Phone: [phone]   Fax:  [phone]  Name: Kerry Solis MRN: [id number] Date of Birth: 1945/09/04

## 2017-09-10 NOTE — Patient Instructions (Addendum)
   BRIDGING: 1-2 sets of 10 repetitions  While lying on your back with knees bent, tighten your lower abdominals, squeeze your buttocks and then raise your buttocks off the floor/bed as creating a "Bridge" with your body. Hold and then lower yourself and repeat.      SIDELYING CLAMSHELL - CLAM SHELL: 1-2 sets of 10 repetitions each leg  While lying on your side with your knees bent, draw up the top knee while keeping contact of your feet together.  Do not let your pelvis roll back during the lifting movement.        TANDEM STANCE WITH SUPPORT: 5 times each foot in front, 10 second holds  Stand in front of a chair, table or counter top for support. Then place the heel of one foot so that it is touching the toes of the other foot. Maintain your balance in this position.

## 2017-09-12 ENCOUNTER — Encounter (HOSPITAL_COMMUNITY): Payer: Self-pay

## 2017-09-12 ENCOUNTER — Ambulatory Visit (HOSPITAL_COMMUNITY): Payer: Medicare Other

## 2017-09-12 DIAGNOSIS — R2689 Other abnormalities of gait and mobility: Secondary | ICD-10-CM

## 2017-09-12 DIAGNOSIS — M6281 Muscle weakness (generalized): Secondary | ICD-10-CM

## 2017-09-12 DIAGNOSIS — R296 Repeated falls: Secondary | ICD-10-CM | POA: Diagnosis not present

## 2017-09-12 NOTE — Therapy (Signed)
[place], [place], [address] Phone: [phone]   Fax:  [phone]  Physical Therapy Treatment  Patient Details  Name: Kerry Solis MRN: [id number] Date of Birth: [birth year] Referring Provider: Kathyrn Drown, MD   Encounter Date: 09/12/2017  PT End of Session - 09/12/17 0943    Visit Number  3    Number of Visits  17    Date for PT Re-Evaluation  10/31/17 Mini re-assess 10/03/17    Authorization Type  Medicare Part A and B, Secondary: [employer] [facility]     Authorization Time Period  09/05/17 to 10/31/17    PT Start Time  0943    PT Stop Time  1027    PT Time Calculation (min)  44 min    Equipment Utilized During Treatment  Gait belt    Activity Tolerance  Patient tolerated treatment well;Patient limited by fatigue    Behavior During Therapy  [facility] for tasks assessed/performed       Past Medical History:  Diagnosis Date  . Anxiety   . Arthritis   . 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 Depression   . Elevated liver enzymes   . Glaucoma   . High triglycerides   . History of gout   . HIV (human immunodeficiency virus infection) ([place])   . Hypertension   . Leukopenia    Low CD4  . Multiple sclerosis ([place])    uses a cane  . Myocardial infarction ([place])    45 years ago  . Prostate cancer [facility])     Past Surgical History:  Procedure Laterality Date  . BIOPSY  08/14/2016   Procedure: BIOPSY;  Surgeon: Danie Binder, MD;  [facility];  Service: Endoscopy;;  gastric bx's  . CHOLECYSTECTOMY  06/10/2011   Procedure: LAPAROSCOPIC CHOLECYSTECTOMY;  Surgeon: Donato Heinz, MD;  Location: [facility] ORS;  Service: General;  Laterality: N/A;  . COLONOSCOPY  03/10/2009   [facility]:[id number] internal hemorrhoids/6-mm sessile ascending colon polyp/tortuous colon, diverticulosis. TA  . COLONOSCOPY WITH PROPOFOL N/A 04/19/2015   Dr. Oneida Alar: sessile serrated adenoma, surveillance in 5 years    . ESOPHAGOGASTRODUODENOSCOPY  06/23/2008   [facility]:[id number] gastritis/schatzki ring  . ESOPHAGOGASTRODUODENOSCOPY (EGD) WITH PROPOFOL N/A 08/14/2016   Dr. Oneida Alar: moderate Schatzi's ring s/p dilation, LA Grade A esophagitis,small hiatal hernia, chronic gastritis (negative H.pylori), one duodenal diverticulum  . NECK SURGERY  unsure   disc  . POLYPECTOMY N/A 04/19/2015   Procedure: POLYPECTOMY;  Surgeon: Danie Binder, MD;  Location: [facility] ORS;  Service: Endoscopy;  Laterality: N/A;  ascending colon  . PROSTATE BIOPSY    . RADIOACTIVE SEED IMPLANT N/A 11/29/2016   Procedure: RADIOACTIVE SEED IMPLANT/BRACHYTHERAPY IMPLANT;  Surgeon: Franchot Gallo, MD;  [facility];  Service: Urology;  Laterality: N/A;  81 seeds implanted  . SAVORY DILATION N/A 08/14/2016   Procedure: SAVORY DILATION;  Surgeon: Danie Binder, MD;  [facility];  Service: Endoscopy;  Laterality: N/A;    There were no vitals filed for this visit.  Subjective Assessment - 09/12/17 0943    Subjective  A little sore after last session.  Can not see out of his left eye. Has a difficult time with depth perception and states it is hard to see changes in surfaces.    Pertinent History  MS; history of prostate cancer in remission by patient report    Limitations  Standing;Walking;Lifting;House  hold activities    How long can you sit comfortably?  Not a problem, until he stands up    How long can you stand comfortably?  20 minutes    How long can you walk comfortably?  Walking 80 feet to mailbox is tiring    Diagnostic tests  MRI Brain in 2016: Examining progression of lesions, showing no new lesions and no acute demyelination    Patient Stated Goals  To improve balance    Currently in Pain?  No/denies Sore in Rt mid trunk area    Pain Location  -- spoke of sharp pain in Rt mid trunk at times when moves wrong                No data recorded       [facility] Adult PT Treatment/Exercise -  09/12/17 0001      Knee/Hip Exercises: Seated   Sit to Sand  5 reps;without UE support slow/controlled, patient using fwd trunk lean to initiate      Knee/Hip Exercises: Supine   Bridges  Both;2 sets;10 reps    Straight Leg Raises  Both;2 sets;10 reps      Knee/Hip Exercises: Sidelying   Hip ABduction  Both;1 set;10 reps    Clams  2x 10 reps Bil LE          Balance Exercises - 09/12/17 0947      Balance Exercises: Standing   Tandem Stance  Eyes open;5 reps;10 secs Bil LE    SLS  Eyes open;Solid surface;Intermittent upper extremity support;3 reps;10 secs    Marching Limitations  3 x 10 reps bil LE, no UE support          PT Short Term Goals - 09/12/17 1017      PT SHORT TERM GOAL #1   Title  Patient will demonstrate understanding and report regular compliance of home exercise program in order to improve patient's balance, safety, and overall functional mobility.     Time  4    Period  Weeks    Status  Partially Met      PT SHORT TERM GOAL #2   Title  Patient will demonstrate an improvement of 1/2 MMT grade in all deficient tested musculature in order to assist with improved mechanics and safety with gait and stair ambulation.     Time  4    Period  Weeks    Status  On-going      PT SHORT TERM GOAL #3   Title  Patient will be educated on RPE and RPE scale and report understanding in order for patient to better prevent fatigue with daily activites.     Time  4    Period  Weeks    Status  Partially Met      PT SHORT TERM GOAL #4   Title  Patient will maintain single limb stance on each lower extremity for 5 seconds indicating an improvement in balance, and assist with stair ambulation.     Time  4    Period  Weeks    Status  Partially Met        PT Long Term Goals - 09/12/17 1018      PT LONG TERM GOAL #1   Title  Patient will demonstrate improvement of 6 points on the Functional Gait assessment in order to indicate improved dynamic balance, improved safety,  and decreased risk of falls.     Time  8    Period  Weeks  Status  On-going      PT LONG TERM GOAL #2   Title  Patient will demonstrate an improvement of 1 MMT grade in all deficient tested musculature in order to assist with improved mechanics and safety with gait and stair ambulation.     Time  8    Period  Weeks    Status  On-going      PT LONG TERM GOAL #3   Title  Patient will perform the 5 times sit to stand in 14.2 seconds or less indicating a decreased risk of falls.     Time  8    Period  Weeks    Status  On-going            Plan - 09/12/17 1013    Clinical Impression Statement  Review RPE scale; added SLS balance to HEP. Patient was able to balance on Rt leg for 10 seconds and Lt for 6 seconds today. Patient will continue to benefor from skilled PT services to progress in therapy and improve functional mobility/balance and decrease risk for falls.    Rehab Potential  Fair    Clinical Impairments Affecting Rehab Potential  Positive: Patient's motivation, family support. Negative: Progressive nature of MS    PT Frequency  2x / week    PT Duration  8 weeks    PT Treatment/Interventions  ADLs/Self Care Home Management;DME Instruction;Gait training;Stair training;Functional mobility training;Therapeutic activities;Therapeutic exercise;Balance training;Neuromuscular re-education;Patient/family education;Wheelchair mobility training;Manual techniques;Passive range of motion;Energy conservation;Taping;Visual/perceptual remediation/compensation    PT Next Visit Plan  Review patient on RPE scale for exertion and use throughout session; advance balance activities focusing on dynamic balance.     PT Home Exercise Plan  09/10/17 - tandem at counter, bridge, clamshell; 09/12/17 - SLS balance    Consulted and Agree with Plan of Care  Patient       Patient will benefit from skilled therapeutic intervention in order to improve the following deficits and impairments:  Abnormal gait,  Decreased balance, Decreased endurance, Decreased mobility, Difficulty walking, Impaired sensation, Impaired vision/preception, Improper body mechanics, Decreased activity tolerance, Decreased coordination, Decreased strength  Visit Diagnosis: Other abnormalities of gait and mobility  Muscle weakness (generalized)  Repeated falls     Problem List Patient Active Problem List   Diagnosis Date Noted  . Lesion of pancreas 04/24/2017  . Gastritis due to nonsteroidal anti-inflammatory drug (NSAID)   . Dysphagia 07/26/2016  . Prostate cancer ([place]) 06/27/2016  . Fall at home 03/15/2016  . Tibial plateau fracture, right 03/15/2016  . Inability to walk 03/15/2016  . Right medial tibial plateau fracture 03/15/2016  . Multiple sclerosis ([place])   . Fall   . Diabetes type 2, controlled ([place]) 10/05/2015  . Gout 10/05/2015  . Fatty liver 06/24/2015  . Hx of adenomatous colonic polyps 03/31/2015  . FH: colon cancer 03/31/2015  . Prediabetes 12/04/2013  . Hyperlipemia 03/11/2013  . Hyperglycemia 03/11/2013  . Stress fracture 01/14/2013  . Knee pain, chronic 01/14/2013  . RECTAL BLEEDING 02/15/2009  . HIV INFECTION 02/14/2009  . UNSPECIFIED VISUAL LOSS 02/14/2009  . Essential hypertension 02/14/2009  . CONSTIPATION 02/14/2009  . ABDOMINAL PAIN 02/14/2009  . Elevated transaminase level 02/14/2009  . HX OF GALLSTONE 02/14/2009    Floria Raveling. Hartnett-Rands, MS, PT [facility] [place] #[id number] 09/12/2017, 10:30 AM  [place] [address] [place], [place], [address] Phone: [phone]   Fax:  [phone]  Name: Kerry Solis MRN:  [id number] Date of Birth: [birth year]

## 2017-09-12 NOTE — Patient Instructions (Signed)
SINGLE LIMB STANCE    Stance: single leg on floor. Raise leg. Hold _15__ seconds. Repeat with other leg. _2__ reps per set, _1__ sets per day. Copyright  V[employer]. All rights reserved.

## 2017-09-13 NOTE — Progress Notes (Signed)
No concerning findings on MRI. No evidence of pancreatic lesion. Good news!

## 2017-09-13 NOTE — Progress Notes (Signed)
PT is aware.

## 2017-09-16 ENCOUNTER — Telehealth (HOSPITAL_COMMUNITY): Payer: Self-pay | Admitting: Family Medicine

## 2017-09-16 NOTE — Telephone Encounter (Signed)
4/pt said to cx this week because he had some teeth pulled today1/19

## 2017-09-17 ENCOUNTER — Encounter (HOSPITAL_COMMUNITY): Payer: Medicare Other | Admitting: Physical Therapy

## 2017-09-19 ENCOUNTER — Encounter (HOSPITAL_COMMUNITY): Payer: Medicare Other | Admitting: Physical Therapy

## 2017-09-19 ENCOUNTER — Other Ambulatory Visit: Payer: Self-pay | Admitting: Nurse Practitioner

## 2017-09-20 ENCOUNTER — Telehealth: Payer: Self-pay | Admitting: Gastroenterology

## 2017-09-20 DIAGNOSIS — Z8619 Personal history of other infectious and parasitic diseases: Secondary | ICD-10-CM

## 2017-09-20 NOTE — Telephone Encounter (Signed)
Received call from Dr. Ala Bent with [facility] on 4/3 regarding starting Ocrevus for MS.   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   Reviewing with local hepatologist.

## 2017-09-23 ENCOUNTER — Telehealth: Payer: Self-pay | Admitting: Family Medicine

## 2017-09-23 ENCOUNTER — Telehealth (HOSPITAL_COMMUNITY): Payer: Self-pay | Admitting: Family Medicine

## 2017-09-23 NOTE — Telephone Encounter (Signed)
Patient wanting you to know his MS doctor took him out his medication until he seen on 4/26. He is completely out of his MS medication also he wanting you to know.

## 2017-09-23 NOTE — Telephone Encounter (Signed)
Patient stated he will notify us if he starts a new med

## 2017-09-23 NOTE — Telephone Encounter (Signed)
Patient stated he is out of his MS meds and has been for 2 weeks. The copay is 1300 and he can not afford it. The MS doctor is aware and will discuss switching meds at visit on 10/11/17

## 2017-09-23 NOTE — Telephone Encounter (Signed)
I am sorry to hear that he is having this issue I hope that they can find a better solution-if they put him on a new medication just have the patient forwarded to [place] information regarding what he is going to be on-I am sure it would be fine but I would just want that for our records

## 2017-09-23 NOTE — Telephone Encounter (Signed)
09/23/17  pt left a messge to cx because he said that his MS Dr didn't want him to be doing anything strenuous until he sees her again on the 26th

## 2017-09-24 ENCOUNTER — Encounter (HOSPITAL_COMMUNITY): Payer: Medicare Other | Admitting: Physical Therapy

## 2017-09-26 ENCOUNTER — Encounter (HOSPITAL_COMMUNITY): Payer: Medicare Other

## 2017-09-27 IMAGING — DX DG KNEE COMPLETE 4+V*R*
4 series · 4 of 4 positions shown · non-contrast
Comparison: 07/19/2014

CLINICAL DATA: Knee pain after falling in hole

EXAM:
RIGHT KNEE - COMPLETE 4+ VIEW

[knee ap]
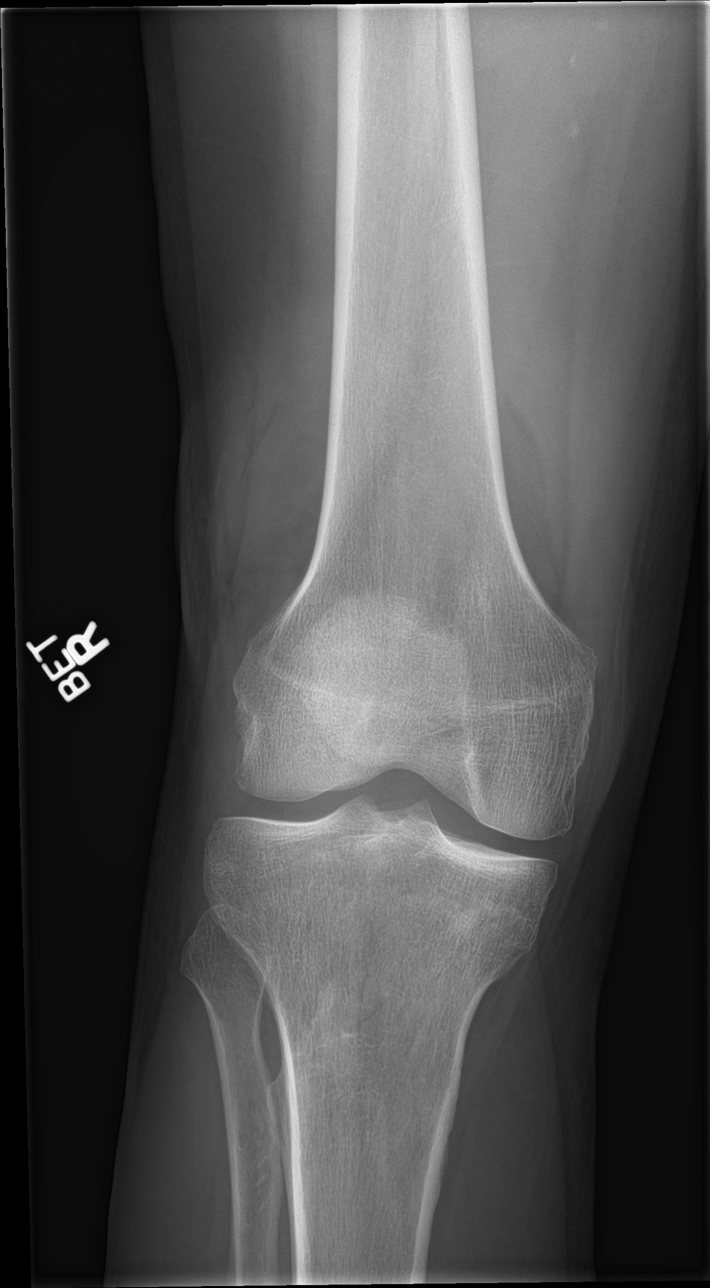

[knee obl (1 of 2)]
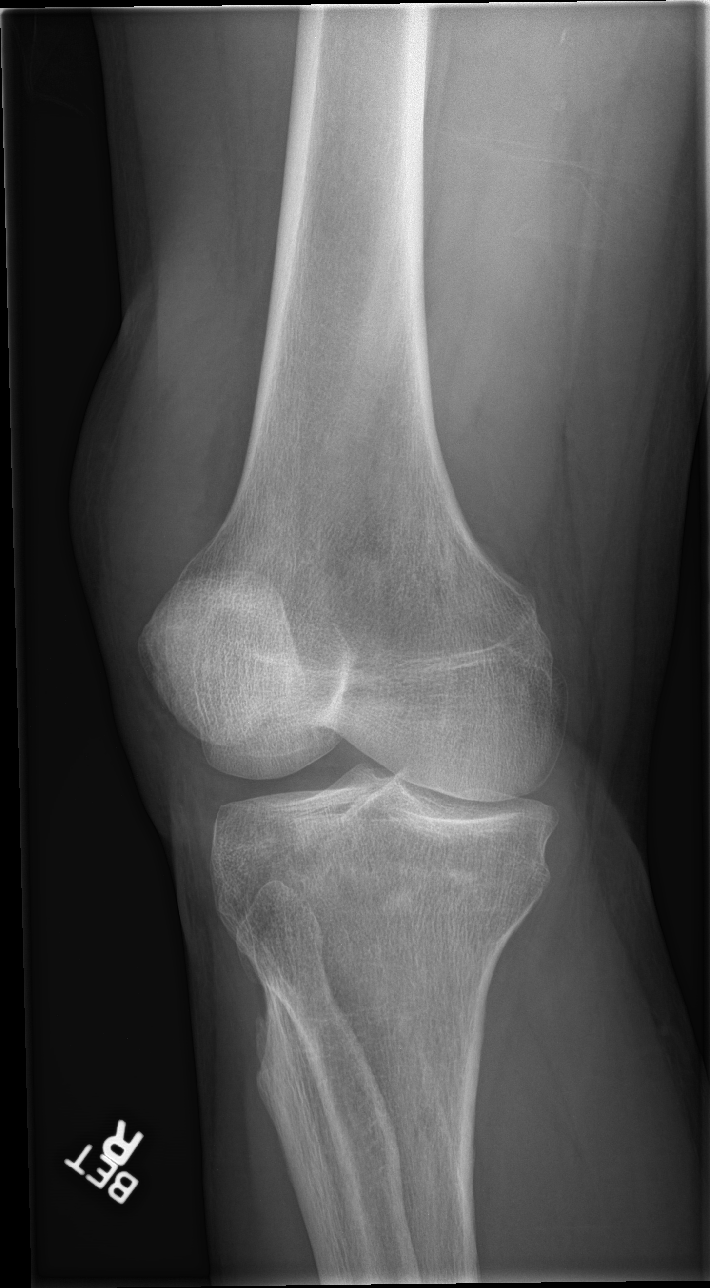

[knee obl (2 of 2)]
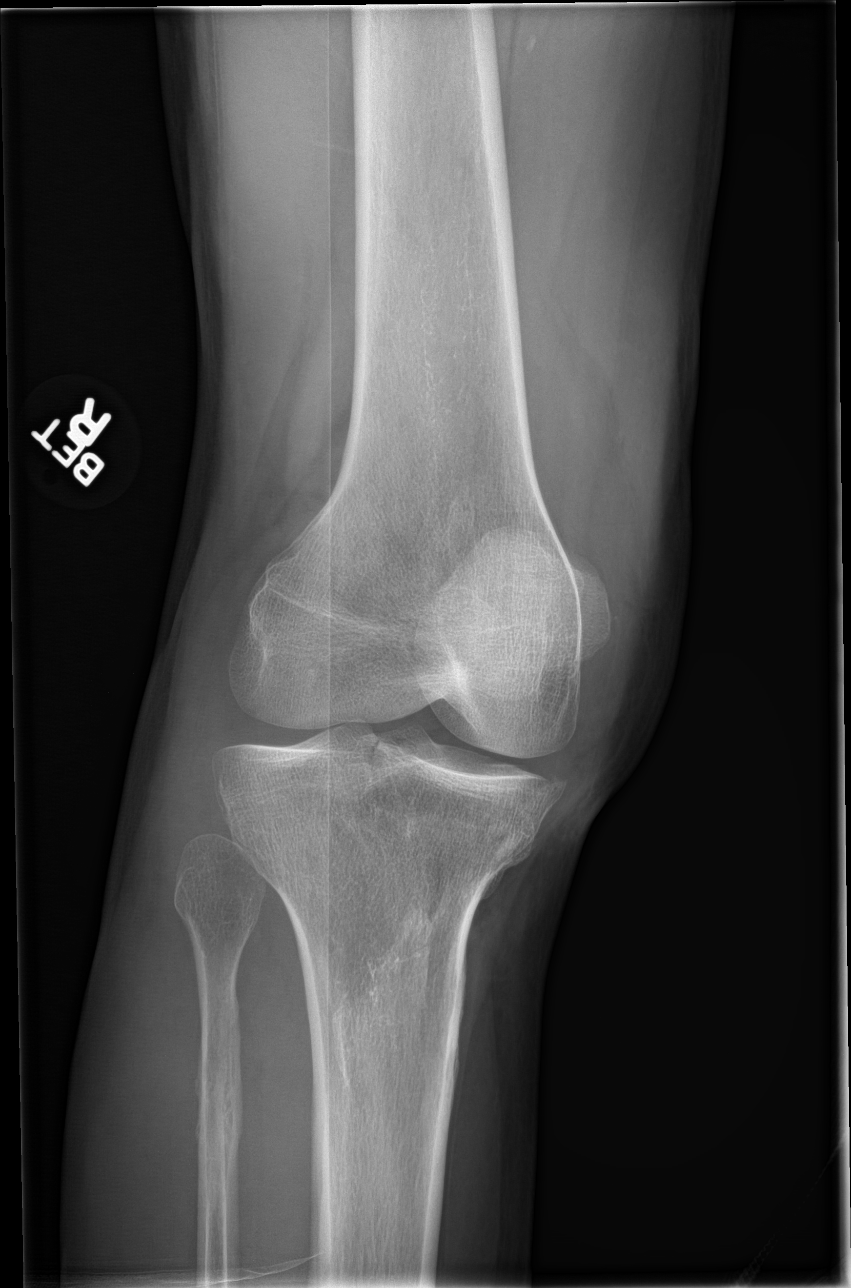

[knee lat]
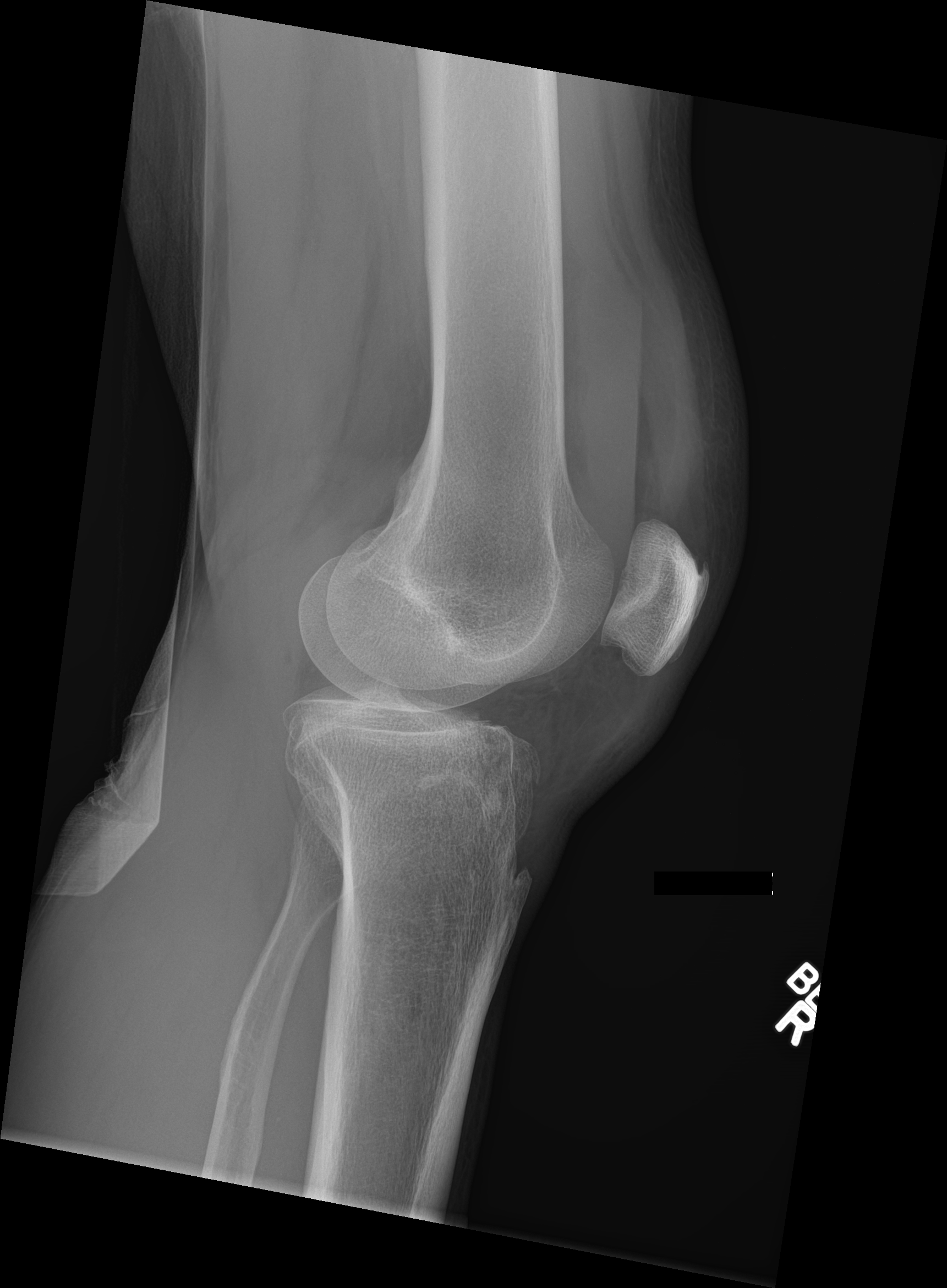

[4 of 4 positions shown; findings below may reference images not displayed]

FINDINGS: There is a large lipohemarthrosis identified. Acute comminuted
fracture deformity involving the medial tibial plateau is
identified. No dislocation.
IMPRESSION: Acute tibial plateau fracture. Suggest further evaluation with CT of
the left knee.

Lipohemarthrosis

## 2017-09-27 NOTE — Telephone Encounter (Signed)
Doris: we need patient to complete Hepatitis B DNA. I have put orders in.   Patient's neurologist considering treatment with Ocrevus, but there is a risk of Hep B reactivation with this medication (he has history of Hep B exposure that was noted on further labs at [facility]).   If they desire to treat, he will need to start treatment for Hepatitis B. I am sending message to Dr. Ala Bent, neurologist,  regarding this. If Ocrevus to start, we will need to refer to [facility] in [place].

## 2017-09-30 ENCOUNTER — Other Ambulatory Visit: Payer: Self-pay

## 2017-09-30 DIAGNOSIS — Z8619 Personal history of other infectious and parasitic diseases: Secondary | ICD-10-CM

## 2017-09-30 NOTE — Telephone Encounter (Signed)
Back to AB.

## 2017-09-30 NOTE — Telephone Encounter (Signed)
Pt is aware to go to the lab.

## 2017-10-01 ENCOUNTER — Encounter (HOSPITAL_COMMUNITY): Payer: Medicare Other | Admitting: Physical Therapy

## 2017-10-02 DIAGNOSIS — Z8619 Personal history of other infectious and parasitic diseases: Secondary | ICD-10-CM | POA: Diagnosis not present

## 2017-10-03 ENCOUNTER — Encounter (HOSPITAL_COMMUNITY): Payer: Medicare Other | Admitting: Physical Therapy

## 2017-10-04 LAB — HEPATITIS B DNA, ULTRAQUANTITATIVE, PCR
Hepatitis B DNA (Calc): <1 Log IU/mL
Hepatitis B DNA: <10 IU/mL

## 2017-10-08 ENCOUNTER — Encounter (HOSPITAL_COMMUNITY): Payer: Medicare Other | Admitting: Physical Therapy

## 2017-10-10 ENCOUNTER — Encounter (HOSPITAL_COMMUNITY): Payer: Medicare Other

## 2017-10-11 DIAGNOSIS — H52201 Unspecified astigmatism, right eye: Secondary | ICD-10-CM | POA: Diagnosis not present

## 2017-10-11 DIAGNOSIS — H401124 Primary open-angle glaucoma, left eye, indeterminate stage: Secondary | ICD-10-CM | POA: Diagnosis not present

## 2017-10-11 DIAGNOSIS — H40011 Open angle with borderline findings, low risk, right eye: Secondary | ICD-10-CM | POA: Diagnosis not present

## 2017-10-11 DIAGNOSIS — H5201 Hypermetropia, right eye: Secondary | ICD-10-CM | POA: Diagnosis not present

## 2017-10-11 DIAGNOSIS — H524 Presbyopia: Secondary | ICD-10-CM | POA: Diagnosis not present

## 2017-10-14 NOTE — Progress Notes (Signed)
HBV DNA not detected. I have communicated this to Dr. Shelia Media at [facility]. Please let patient know!

## 2017-10-14 NOTE — Telephone Encounter (Signed)
HBV DNA negative. I have updated Dr. Shelia Media.

## 2017-10-15 ENCOUNTER — Telehealth (HOSPITAL_COMMUNITY): Payer: Self-pay | Admitting: Family Medicine

## 2017-10-15 ENCOUNTER — Ambulatory Visit (HOSPITAL_COMMUNITY): Payer: Medicare Other | Attending: Family Medicine | Admitting: Physical Therapy

## 2017-10-15 NOTE — Progress Notes (Signed)
PT is aware.

## 2017-10-15 NOTE — Telephone Encounter (Signed)
10/15/17  I called and left a message to find out if the patient had seen his MS dr. On April 26.... He had an appt today and didn't show and I wondered if the dr advised him to not return but I didn't cancel anything until we spoke with patient.

## 2017-10-17 ENCOUNTER — Ambulatory Visit (HOSPITAL_COMMUNITY): Payer: Medicare Other | Attending: Family Medicine | Admitting: Physical Therapy

## 2017-10-17 ENCOUNTER — Telehealth (HOSPITAL_COMMUNITY): Payer: Self-pay | Admitting: Physical Therapy

## 2017-10-17 ENCOUNTER — Other Ambulatory Visit: Payer: Self-pay | Admitting: Family Medicine

## 2017-10-17 NOTE — Telephone Encounter (Signed)
Tried to call no answer. Amlodipine is different dose than that on med list

## 2017-10-17 NOTE — Telephone Encounter (Signed)
Second consecutive NS today.  Contacted patient who reports he is having a lot of issues with his MS/changing of meds.  Requests therapy be discharged for now and he will contact office when he is ready/willing to resume.  Teena Irani, PTA/CLT [phone]

## 2017-10-18 NOTE — Telephone Encounter (Signed)
Called pt. He is not sure which one. Will look at bottle and call us back

## 2017-10-22 ENCOUNTER — Encounter (HOSPITAL_COMMUNITY): Payer: Medicare Other | Admitting: Physical Therapy

## 2017-10-23 ENCOUNTER — Telehealth (HOSPITAL_COMMUNITY): Payer: Self-pay | Admitting: Family Medicine

## 2017-10-23 NOTE — Telephone Encounter (Signed)
10/23/17  Pt called and said that he would come back to therapy once he had the work done.

## 2017-10-24 ENCOUNTER — Encounter (HOSPITAL_COMMUNITY): Payer: Medicare Other | Admitting: Physical Therapy

## 2017-10-29 ENCOUNTER — Encounter (HOSPITAL_COMMUNITY): Payer: Self-pay | Admitting: Physical Therapy

## 2017-10-29 ENCOUNTER — Encounter (HOSPITAL_COMMUNITY): Payer: Medicare Other | Admitting: Physical Therapy

## 2017-10-29 NOTE — Therapy (Signed)
[place], [place], [address] Phone: [phone]   Fax:  [phone]  Patient Details  Name: Kerry Solis MRN: [id number] Date of Birth: Nov 11, 1945 Referring Provider:  No ref. provider found  Encounter Date: 10/29/2017  PHYSICAL THERAPY DISCHARGE SUMMARY  Visits from Start of Care: 3  Current functional level related to goals / functional outcomes: Unable to assess as patient did not return for re-assessment.    Remaining deficits: Unable to assess as patient did not return for re-assessment.    Education / Equipment: HEP was initiated, but further education was not provided as patient did not return after 3 visits due to medical reasons.  Plan: Patient agrees to discharge.  Patient goals were not met. Patient is being discharged due to the patient's request.  ?????   Patient reported having changes in medication and requested discharge at this time.           Clarene Critchley PT, DPT 3:47 PM, 10/29/17 [place], [place], [address] Phone: [phone]   Fax:  [phone]

## 2017-10-31 ENCOUNTER — Ambulatory Visit (HOSPITAL_COMMUNITY): Payer: Medicare Other | Admitting: Physical Therapy

## 2017-11-12 ENCOUNTER — Encounter: Payer: Self-pay | Admitting: Family Medicine

## 2017-11-12 ENCOUNTER — Ambulatory Visit (INDEPENDENT_AMBULATORY_CARE_PROVIDER_SITE_OTHER): Payer: Medicare Other | Admitting: Family Medicine

## 2017-11-12 VITALS — BP 142/82 | Ht 68.0 in | Wt 207.0 lb

## 2017-11-12 DIAGNOSIS — Z130 Encounter for screening for diseases of the blood and blood-forming organs and certain disorders involving the immune mechanism: Secondary | ICD-10-CM | POA: Diagnosis not present

## 2017-11-12 DIAGNOSIS — I10 Essential (primary) hypertension: Secondary | ICD-10-CM

## 2017-11-12 DIAGNOSIS — E119 Type 2 diabetes mellitus without complications: Secondary | ICD-10-CM

## 2017-11-12 LAB — POCT GLYCOSYLATED HEMOGLOBIN (HGB A1C): Hemoglobin A1C: 6.1 % — AB (ref 4.0–5.6)

## 2017-11-12 LAB — POCT HEMOGLOBIN: Hemoglobin: 15 g/dL (ref 14.1–18.1)

## 2017-11-12 MED ORDER — AMLODIPINE BESYLATE 5 MG PO TABS: ORAL_TABLET | ORAL | 6 refills | Status: DC

## 2017-11-12 MED ORDER — LISINOPRIL-HYDROCHLOROTHIAZIDE 10-12.5 MG PO TABS: ORAL_TABLET | ORAL | 6 refills | Status: DC

## 2017-11-12 NOTE — Patient Instructions (Signed)
Do your labs in July  Recheck here in Sept  Sooner if any problems

## 2017-11-12 NOTE — Progress Notes (Signed)
   Subjective:    Patient ID: Kerry Solis, male    DOB: 07-30-1945, 72 y.o.   MRN: [id number]  HPI Patient is here today to follow up on his chronic health issues. He is on Norvasc 5 mg daily for Htn. He takes zoloft 100 mg one daily. He states he is not a diabetic, but problem list states he is type two. Last a1c was 6.1. He does not take any medications for this. He states he has been with out his Ms medication for some time. As he can not afford.I advised that he call the Dr that handles that and see if there is a patient assistance program that can help with that.    Review of Systems  Constitutional: Negative for activity change, appetite change and fatigue.  HENT: Negative for congestion and rhinorrhea.   Respiratory: Negative for cough, chest tightness and shortness of breath.   Cardiovascular: Negative for chest pain and leg swelling.  Gastrointestinal: Negative for abdominal pain, diarrhea and nausea.  Endocrine: Negative for polydipsia and polyphagia.  Genitourinary: Negative for dysuria and hematuria.  Neurological: Negative for weakness and headaches.  Psychiatric/Behavioral: Negative for confusion and dysphoric mood.   Results for orders placed or performed in visit on 11/12/17  POCT glycosylated hemoglobin (Hb A1C)  Result Value Ref Range   Hemoglobin A1C 6.1 (A) 4.0 - 5.6 %   HbA1c, POC (prediabetic range)  5.7 - 6.4 %   HbA1c, POC (controlled diabetic range)  0.0 - 7.0 %       Objective:   Physical Exam  Constitutional: He appears well-nourished. No distress.  Cardiovascular: Normal rate, regular rhythm and normal heart sounds.  No murmur heard. Pulmonary/Chest: Effort normal and breath sounds normal. No respiratory distress.  Musculoskeletal: He exhibits no edema.  Lymphadenopathy:    He has no cervical adenopathy.  Neurological: He is alert.  Psychiatric: His behavior is normal.  Vitals reviewed.  Results for orders placed or performed in visit on 11/12/17   POCT glycosylated hemoglobin (Hb A1C)  Result Value Ref Range   Hemoglobin A1C 6.1 (A) 4.0 - 5.6 %   HbA1c, POC (prediabetic range)  5.7 - 6.4 %   HbA1c, POC (controlled diabetic range)  0.0 - 7.0 %  POCT hemoglobin  Result Value Ref Range   Hemoglobin 15.0 14.1 - 18.1 g/dL          Assessment & Plan:  Diabetes under good control watch diet stay active  Blood pressure under good control continue medication  Fatigue tiredness hemoglobin looks good probably related to his MS not being able to take his medicine  Patient is a do his lab work in July and follow-up in September

## 2017-11-22 DIAGNOSIS — E785 Hyperlipidemia, unspecified: Secondary | ICD-10-CM | POA: Diagnosis not present

## 2017-11-22 DIAGNOSIS — G35 Multiple sclerosis: Secondary | ICD-10-CM | POA: Diagnosis not present

## 2017-11-22 DIAGNOSIS — I10 Essential (primary) hypertension: Secondary | ICD-10-CM | POA: Diagnosis not present

## 2017-11-22 DIAGNOSIS — B20 Human immunodeficiency virus [HIV] disease: Secondary | ICD-10-CM | POA: Diagnosis not present

## 2017-11-22 DIAGNOSIS — Z21 Asymptomatic human immunodeficiency virus [HIV] infection status: Secondary | ICD-10-CM | POA: Diagnosis not present

## 2017-11-22 DIAGNOSIS — Z79899 Other long term (current) drug therapy: Secondary | ICD-10-CM | POA: Diagnosis not present

## 2017-12-11 DIAGNOSIS — Z79899 Other long term (current) drug therapy: Secondary | ICD-10-CM | POA: Diagnosis not present

## 2017-12-11 DIAGNOSIS — G35 Multiple sclerosis: Secondary | ICD-10-CM | POA: Diagnosis not present

## 2017-12-13 ENCOUNTER — Encounter (HOSPITAL_COMMUNITY)
Admission: EM | Disposition: A | Discharge status: Home / Self Care | Payer: Self-pay | Source: Home / Self Care | Attending: Emergency Medicine

## 2017-12-13 ENCOUNTER — Other Ambulatory Visit: Payer: Self-pay

## 2017-12-13 ENCOUNTER — Emergency Department (HOSPITAL_COMMUNITY): Payer: Medicare Other

## 2017-12-13 ENCOUNTER — Emergency Department (HOSPITAL_COMMUNITY): Payer: Medicare Other | Admitting: Anesthesiology

## 2017-12-13 ENCOUNTER — Observation Stay (HOSPITAL_COMMUNITY)
Admission: EM | Admit: 2017-12-13 | Discharge: 2017-12-14 | Disposition: A | Discharge status: Home / Self Care | Payer: Medicare Other | Attending: General Surgery | Admitting: General Surgery

## 2017-12-13 ENCOUNTER — Encounter (HOSPITAL_COMMUNITY): Payer: Self-pay

## 2017-12-13 DIAGNOSIS — Z79899 Other long term (current) drug therapy: Secondary | ICD-10-CM | POA: Diagnosis not present

## 2017-12-13 DIAGNOSIS — Z9049 Acquired absence of other specified parts of digestive tract: Secondary | ICD-10-CM

## 2017-12-13 DIAGNOSIS — I10 Essential (primary) hypertension: Secondary | ICD-10-CM | POA: Insufficient documentation

## 2017-12-13 DIAGNOSIS — E119 Type 2 diabetes mellitus without complications: Secondary | ICD-10-CM | POA: Diagnosis not present

## 2017-12-13 DIAGNOSIS — E785 Hyperlipidemia, unspecified: Secondary | ICD-10-CM | POA: Insufficient documentation

## 2017-12-13 DIAGNOSIS — R1030 Lower abdominal pain, unspecified: Secondary | ICD-10-CM | POA: Diagnosis not present

## 2017-12-13 DIAGNOSIS — Z21 Asymptomatic human immunodeficiency virus [HIV] infection status: Secondary | ICD-10-CM | POA: Insufficient documentation

## 2017-12-13 DIAGNOSIS — K3580 Unspecified acute appendicitis: Secondary | ICD-10-CM | POA: Diagnosis not present

## 2017-12-13 DIAGNOSIS — Z7982 Long term (current) use of aspirin: Secondary | ICD-10-CM | POA: Insufficient documentation

## 2017-12-13 DIAGNOSIS — M109 Gout, unspecified: Secondary | ICD-10-CM | POA: Insufficient documentation

## 2017-12-13 DIAGNOSIS — F329 Major depressive disorder, single episode, unspecified: Secondary | ICD-10-CM | POA: Diagnosis not present

## 2017-12-13 DIAGNOSIS — E781 Pure hyperglyceridemia: Secondary | ICD-10-CM | POA: Diagnosis not present

## 2017-12-13 DIAGNOSIS — I252 Old myocardial infarction: Secondary | ICD-10-CM | POA: Insufficient documentation

## 2017-12-13 DIAGNOSIS — R079 Chest pain, unspecified: Secondary | ICD-10-CM | POA: Diagnosis not present

## 2017-12-13 DIAGNOSIS — H409 Unspecified glaucoma: Secondary | ICD-10-CM | POA: Diagnosis not present

## 2017-12-13 DIAGNOSIS — Z8546 Personal history of malignant neoplasm of prostate: Secondary | ICD-10-CM | POA: Diagnosis not present

## 2017-12-13 DIAGNOSIS — G35 Multiple sclerosis: Secondary | ICD-10-CM | POA: Insufficient documentation

## 2017-12-13 DIAGNOSIS — R0789 Other chest pain: Secondary | ICD-10-CM | POA: Diagnosis not present

## 2017-12-13 DIAGNOSIS — R9431 Abnormal electrocardiogram [ECG] [EKG]: Secondary | ICD-10-CM | POA: Diagnosis not present

## 2017-12-13 DIAGNOSIS — N200 Calculus of kidney: Secondary | ICD-10-CM | POA: Diagnosis not present

## 2017-12-13 DIAGNOSIS — R52 Pain, unspecified: Secondary | ICD-10-CM | POA: Diagnosis not present

## 2017-12-13 DIAGNOSIS — R1084 Generalized abdominal pain: Secondary | ICD-10-CM | POA: Diagnosis not present

## 2017-12-13 DIAGNOSIS — R109 Unspecified abdominal pain: Secondary | ICD-10-CM | POA: Diagnosis present

## 2017-12-13 HISTORY — PX: LAPAROSCOPIC APPENDECTOMY: SHX408

## 2017-12-13 LAB — COMPREHENSIVE METABOLIC PANEL
ALT: 38 U/L (ref 0–44)
AST: 39 U/L (ref 15–41)
Albumin: 4.5 g/dL (ref 3.5–5.0)
Alkaline Phosphatase: 54 U/L (ref 38–126)
Anion gap: 8 (ref 5–15)
BUN: 14 mg/dL (ref 8–23)
CO2: 27 mmol/L (ref 22–32)
Calcium: 9.5 mg/dL (ref 8.9–10.3)
Chloride: 104 mmol/L (ref 98–111)
Creatinine, Ser: 1.25 mg/dL — ABNORMAL HIGH (ref 0.61–1.24)
GFR calc Af Amer: >60 mL/min (ref >60)
GFR calc non Af Amer: 55 mL/min — ABNORMAL LOW (ref >60)
Glucose, Bld: 124 mg/dL — ABNORMAL HIGH (ref 70–99)
Potassium: 3.8 mmol/L (ref 3.5–5.1)
Sodium: 139 mmol/L (ref 135–145)
Total Bilirubin: 0.5 mg/dL (ref 0.3–1.2)
Total Protein: 8.4 g/dL — ABNORMAL HIGH (ref 6.5–8.1)

## 2017-12-13 LAB — LIPASE, BLOOD: Lipase: 36 U/L (ref 11–51)

## 2017-12-13 LAB — CBC
HCT: 44 % (ref 39.0–52.0)
Hemoglobin: 14.4 g/dL (ref 13.0–17.0)
MCH: 28.7 pg (ref 26.0–34.0)
MCHC: 32.7 g/dL (ref 30.0–36.0)
MCV: 87.8 fL (ref 78.0–100.0)
Platelets: 176 10*3/uL (ref 150–400)
RBC: 5.01 MIL/uL (ref 4.22–5.81)
RDW: 14.5 % (ref 11.5–15.5)
WBC: 6.9 10*3/uL (ref 4.0–10.5)

## 2017-12-13 LAB — URINALYSIS, ROUTINE W REFLEX MICROSCOPIC
Bilirubin Urine: NEGATIVE
Glucose, UA: NEGATIVE mg/dL
Hgb urine dipstick: NEGATIVE
Ketones, ur: NEGATIVE mg/dL
Leukocytes, UA: NEGATIVE
Nitrite: NEGATIVE
Protein, ur: NEGATIVE mg/dL
Specific Gravity, Urine: 1.012 (ref 1.005–1.030)
pH: 8 (ref 5.0–8.0)

## 2017-12-13 LAB — TROPONIN I: Troponin I: <0.03 ng/mL (ref <0.03)

## 2017-12-13 SURGERY — APPENDECTOMY, LAPAROSCOPIC
Anesthesia: General

## 2017-12-13 MED ORDER — PROPOFOL 10 MG/ML IV BOLUS
INTRAVENOUS | Status: DC | PRN
  Administered 2017-12-13: 50 mg via INTRAVENOUS
  Administered 2017-12-13: 150 mg via INTRAVENOUS

## 2017-12-13 MED ORDER — GLYCOPYRROLATE 0.2 MG/ML IJ SOLN
INTRAMUSCULAR | Status: DC | PRN
  Administered 2017-12-13: 0.3 mg via INTRAVENOUS

## 2017-12-13 MED ORDER — LISINOPRIL 10 MG PO TABS: 10.0000 mg | ORAL_TABLET | Freq: Every day | ORAL | Status: DC

## 2017-12-13 MED ORDER — DEXAMETHASONE SODIUM PHOSPHATE 4 MG/ML IJ SOLN
INTRAMUSCULAR | Status: DC | PRN
  Administered 2017-12-13: 8 mg via INTRAVENOUS

## 2017-12-13 MED ORDER — LIDOCAINE HCL (CARDIAC) PF 100 MG/5ML IV SOSY
PREFILLED_SYRINGE | INTRAVENOUS | Status: DC | PRN
  Administered 2017-12-13: 50 mg via INTRAVENOUS

## 2017-12-13 MED ORDER — LISINOPRIL-HYDROCHLOROTHIAZIDE 10-12.5 MG PO TABS: 1.0000 | ORAL_TABLET | Freq: Every day | ORAL | Status: DC

## 2017-12-13 MED ORDER — ONDANSETRON HCL 4 MG/2ML IJ SOLN
INTRAMUSCULAR | Status: AC
  Filled 2017-12-13: qty 2

## 2017-12-13 MED ORDER — AMLODIPINE BESYLATE 5 MG PO TABS: 5.0000 mg | ORAL_TABLET | Freq: Every day | ORAL | Status: DC

## 2017-12-13 MED ORDER — DEXAMETHASONE SODIUM PHOSPHATE 4 MG/ML IJ SOLN
INTRAMUSCULAR | Status: AC
  Filled 2017-12-13: qty 2

## 2017-12-13 MED ORDER — SODIUM CHLORIDE 0.9 % IR SOLN
Status: DC | PRN
  Administered 2017-12-13: 1000 mL

## 2017-12-13 MED ORDER — DEXMEDETOMIDINE HCL 200 MCG/2ML IV SOLN
INTRAVENOUS | Status: AC
  Filled 2017-12-13: qty 2

## 2017-12-13 MED ORDER — HYDROCHLOROTHIAZIDE 12.5 MG PO CAPS: 12.5000 mg | ORAL_CAPSULE | Freq: Every day | ORAL | Status: DC

## 2017-12-13 MED ORDER — GLYCOPYRROLATE 0.2 MG/ML IJ SOLN
INTRAMUSCULAR | Status: AC
Start: 2017-12-13 — End: ?
  Filled 2017-12-13: qty 1

## 2017-12-13 MED ORDER — SODIUM CHLORIDE 0.9 % IV SOLN
INTRAVENOUS | Status: DC
  Administered 2017-12-13: 16:00:00 via INTRAVENOUS

## 2017-12-13 MED ORDER — BUPIVACAINE LIPOSOME 1.3 % IJ SUSP
INTRAMUSCULAR | Status: AC
  Filled 2017-12-13: qty 20

## 2017-12-13 MED ORDER — MORPHINE SULFATE (PF) 2 MG/ML IV SOLN: 2.0000 mg | INTRAVENOUS | Status: DC | PRN

## 2017-12-13 MED ORDER — OXYCODONE HCL 5 MG PO TABS
5.0000 mg | ORAL_TABLET | Freq: Four times a day (QID) | ORAL | Status: DC | PRN
  Administered 2017-12-13: 5 mg via ORAL
  Filled 2017-12-13: qty 1

## 2017-12-13 MED ORDER — PROPOFOL 10 MG/ML IV BOLUS
INTRAVENOUS | Status: AC
  Filled 2017-12-13: qty 20

## 2017-12-13 MED ORDER — ENOXAPARIN SODIUM 40 MG/0.4ML ~~LOC~~ SOLN: 40.0000 mg | SUBCUTANEOUS | Status: DC

## 2017-12-13 MED ORDER — SODIUM CHLORIDE 0.9 % IV BOLUS
500.0000 mL | Freq: Once | INTRAVENOUS | Status: AC
  Administered 2017-12-13: 500 mL via INTRAVENOUS

## 2017-12-13 MED ORDER — PRAVASTATIN SODIUM 10 MG PO TABS
20.0000 mg | ORAL_TABLET | Freq: Every day | ORAL | Status: DC
  Administered 2017-12-13: 20 mg via ORAL
  Filled 2017-12-13: qty 2

## 2017-12-13 MED ORDER — SERTRALINE HCL 50 MG PO TABS: 100.0000 mg | ORAL_TABLET | Freq: Every day | ORAL | Status: DC

## 2017-12-13 MED ORDER — DOLUTEGRAVIR SODIUM 50 MG PO TABS
50.0000 mg | ORAL_TABLET | Freq: Every day | ORAL | Status: DC
  Filled 2017-12-13 (×3): qty 1

## 2017-12-13 MED ORDER — LIDOCAINE HCL (PF) 1 % IJ SOLN
INTRAMUSCULAR | Status: AC
  Filled 2017-12-13: qty 5

## 2017-12-13 MED ORDER — ONDANSETRON HCL 4 MG/2ML IJ SOLN
4.0000 mg | Freq: Once | INTRAMUSCULAR | Status: AC
  Administered 2017-12-13: 4 mg via INTRAVENOUS
  Filled 2017-12-13: qty 2

## 2017-12-13 MED ORDER — HYDROCODONE-ACETAMINOPHEN 7.5-325 MG PO TABS: 1.0000 | ORAL_TABLET | Freq: Once | ORAL | Status: DC | PRN

## 2017-12-13 MED ORDER — SODIUM CHLORIDE 0.9 % IJ SOLN
INTRAMUSCULAR | Status: AC
  Filled 2017-12-13: qty 10

## 2017-12-13 MED ORDER — DEXMEDETOMIDINE HCL IN NACL 200 MCG/50ML IV SOLN
INTRAVENOUS | Status: DC | PRN
  Administered 2017-12-13: 10 ug via INTRAVENOUS
  Administered 2017-12-13: 40 ug via INTRAVENOUS

## 2017-12-13 MED ORDER — IOPAMIDOL (ISOVUE-300) INJECTION 61%
100.0000 mL | Freq: Once | INTRAVENOUS | Status: AC | PRN
  Administered 2017-12-13: 100 mL via INTRAVENOUS

## 2017-12-13 MED ORDER — ALLOPURINOL 100 MG PO TABS: 100.0000 mg | ORAL_TABLET | Freq: Every day | ORAL | Status: DC

## 2017-12-13 MED ORDER — HYDROMORPHONE HCL 1 MG/ML IJ SOLN: 0.2500 mg | INTRAMUSCULAR | Status: DC | PRN

## 2017-12-13 MED ORDER — ONDANSETRON HCL 4 MG/2ML IJ SOLN
INTRAMUSCULAR | Status: DC | PRN
  Administered 2017-12-13: 4 mg via INTRAVENOUS

## 2017-12-13 MED ORDER — ACETAMINOPHEN 650 MG RE SUPP
650.0000 mg | Freq: Four times a day (QID) | RECTAL | Status: DC | PRN
Start: 2017-12-13 — End: 2017-12-14

## 2017-12-13 MED ORDER — NEOSTIGMINE METHYLSULFATE 10 MG/10ML IV SOLN
INTRAVENOUS | Status: AC
Start: 2017-12-13 — End: ?
  Filled 2017-12-13: qty 1

## 2017-12-13 MED ORDER — ACETAMINOPHEN 325 MG PO TABS: 650.0000 mg | ORAL_TABLET | Freq: Four times a day (QID) | ORAL | Status: DC | PRN

## 2017-12-13 MED ORDER — MEPERIDINE HCL 50 MG/ML IJ SOLN: 6.2500 mg | INTRAMUSCULAR | Status: DC | PRN

## 2017-12-13 MED ORDER — KETOROLAC TROMETHAMINE 30 MG/ML IJ SOLN
INTRAMUSCULAR | Status: DC | PRN
  Administered 2017-12-13: 30 mg via INTRAVENOUS

## 2017-12-13 MED ORDER — LACTATED RINGERS IV SOLN: INTRAVENOUS | Status: DC

## 2017-12-13 MED ORDER — TRAMADOL HCL 50 MG PO TABS: 50.0000 mg | ORAL_TABLET | Freq: Four times a day (QID) | ORAL | Status: DC | PRN

## 2017-12-13 MED ORDER — VECURONIUM BROMIDE 10 MG IV SOLR
INTRAVENOUS | Status: AC
  Filled 2017-12-13: qty 10

## 2017-12-13 MED ORDER — FENTANYL CITRATE (PF) 100 MCG/2ML IJ SOLN
INTRAMUSCULAR | Status: AC
  Filled 2017-12-13: qty 2

## 2017-12-13 MED ORDER — FOLIC ACID 1 MG PO TABS: 1.0000 mg | ORAL_TABLET | Freq: Every day | ORAL | Status: DC

## 2017-12-13 MED ORDER — POVIDONE-IODINE 10 % OINT PACKET
TOPICAL_OINTMENT | CUTANEOUS | Status: DC | PRN
  Administered 2017-12-13: 1 via TOPICAL

## 2017-12-13 MED ORDER — KETOROLAC TROMETHAMINE 15 MG/ML IJ SOLN
15.0000 mg | Freq: Four times a day (QID) | INTRAMUSCULAR | Status: AC
  Administered 2017-12-13: 15 mg via INTRAVENOUS
  Filled 2017-12-13: qty 1

## 2017-12-13 MED ORDER — POVIDONE-IODINE 10 % EX OINT
TOPICAL_OINTMENT | CUTANEOUS | Status: AC
  Filled 2017-12-13: qty 1

## 2017-12-13 MED ORDER — ONDANSETRON HCL 4 MG/2ML IJ SOLN: 4.0000 mg | Freq: Four times a day (QID) | INTRAMUSCULAR | Status: DC | PRN

## 2017-12-13 MED ORDER — SUCCINYLCHOLINE CHLORIDE 20 MG/ML IJ SOLN
INTRAMUSCULAR | Status: DC | PRN
  Administered 2017-12-13: 120 mg via INTRAVENOUS

## 2017-12-13 MED ORDER — PIPERACILLIN-TAZOBACTAM 3.375 G IVPB
3.3750 g | Freq: Three times a day (TID) | INTRAVENOUS | Status: DC
  Administered 2017-12-13: 3.375 g via INTRAVENOUS
  Filled 2017-12-13: qty 50

## 2017-12-13 MED ORDER — PHENYLEPHRINE HCL 10 MG/ML IJ SOLN
INTRAMUSCULAR | Status: DC | PRN
  Administered 2017-12-13: 200 ug via INTRAVENOUS
  Administered 2017-12-13: 100 ug via INTRAVENOUS

## 2017-12-13 MED ORDER — KETOROLAC TROMETHAMINE 15 MG/ML IJ SOLN: 15.0000 mg | Freq: Four times a day (QID) | INTRAMUSCULAR | Status: DC | PRN

## 2017-12-13 MED ORDER — NEOSTIGMINE METHYLSULFATE 10 MG/10ML IV SOLN
INTRAVENOUS | Status: DC | PRN
  Administered 2017-12-13: 3 mg via INTRAVENOUS

## 2017-12-13 MED ORDER — LORAZEPAM 2 MG/ML IJ SOLN: 1.0000 mg | INTRAMUSCULAR | Status: DC | PRN

## 2017-12-13 MED ORDER — KETOROLAC TROMETHAMINE 30 MG/ML IJ SOLN: 30.0000 mg | Freq: Once | INTRAMUSCULAR | Status: DC | PRN

## 2017-12-13 MED ORDER — SUCCINYLCHOLINE CHLORIDE 20 MG/ML IJ SOLN
INTRAMUSCULAR | Status: AC
  Filled 2017-12-13: qty 1

## 2017-12-13 MED ORDER — ONDANSETRON 4 MG PO TBDP: 4.0000 mg | ORAL_TABLET | Freq: Four times a day (QID) | ORAL | Status: DC | PRN

## 2017-12-13 MED ORDER — CARBAMAZEPINE 100 MG PO CHEW
100.0000 mg | CHEWABLE_TABLET | Freq: Every day | ORAL | Status: DC
  Filled 2017-12-13 (×3): qty 1

## 2017-12-13 MED ORDER — KETOROLAC TROMETHAMINE 30 MG/ML IJ SOLN
INTRAMUSCULAR | Status: AC
  Filled 2017-12-13: qty 1

## 2017-12-13 MED ORDER — CHLORZOXAZONE 500 MG PO TABS
500.0000 mg | ORAL_TABLET | Freq: Four times a day (QID) | ORAL | Status: DC | PRN
  Filled 2017-12-13: qty 1

## 2017-12-13 MED ORDER — LATANOPROST 0.005 % OP SOLN
1.0000 [drp] | Freq: Every day | OPHTHALMIC | Status: DC
  Administered 2017-12-13: 1 [drp] via OPHTHALMIC
  Filled 2017-12-13 (×2): qty 2.5

## 2017-12-13 MED ORDER — ONDANSETRON HCL 4 MG/2ML IJ SOLN: 4.0000 mg | Freq: Once | INTRAMUSCULAR | Status: DC | PRN

## 2017-12-13 MED ORDER — SIMETHICONE 80 MG PO CHEW: 40.0000 mg | CHEWABLE_TABLET | Freq: Four times a day (QID) | ORAL | Status: DC | PRN

## 2017-12-13 MED ORDER — VECURONIUM BROMIDE 10 MG IV SOLR
INTRAVENOUS | Status: DC | PRN
  Administered 2017-12-13 (×3): 2 mg via INTRAVENOUS

## 2017-12-13 MED ORDER — PIPERACILLIN-TAZOBACTAM 3.375 G IVPB 30 MIN
3.3750 g | Freq: Once | INTRAVENOUS | Status: AC
  Administered 2017-12-13: 3.375 g via INTRAVENOUS
  Filled 2017-12-13: qty 50

## 2017-12-13 MED ORDER — GLYCOPYRROLATE 0.2 MG/ML IJ SOLN
INTRAMUSCULAR | Status: AC
  Filled 2017-12-13: qty 2

## 2017-12-13 MED ORDER — BUPIVACAINE LIPOSOME 1.3 % IJ SUSP
INTRAMUSCULAR | Status: DC | PRN
  Administered 2017-12-13: 20 mL

## 2017-12-13 MED ORDER — FENTANYL CITRATE (PF) 100 MCG/2ML IJ SOLN
50.0000 ug | INTRAMUSCULAR | Status: DC | PRN
  Administered 2017-12-13: 50 ug via INTRAVENOUS
  Administered 2017-12-13: 100 ug via INTRAVENOUS
  Filled 2017-12-13: qty 2

## 2017-12-13 SURGICAL SUPPLY — 55 items
BAG RETRIEVAL 10 (BASKET) ×1
CHLORAPREP W/TINT 26ML (MISCELLANEOUS) ×2 IMPLANT
CLOTH BEACON ORANGE TIMEOUT ST (SAFETY) ×2 IMPLANT
COVER LIGHT HANDLE STERIS (MISCELLANEOUS) ×4 IMPLANT
CUTTER FLEX LINEAR 45M (STAPLE) ×2 IMPLANT
DECANTER SPIKE VIAL GLASS SM (MISCELLANEOUS) ×2 IMPLANT
ELECT REM PT RETURN 9FT ADLT (ELECTROSURGICAL) ×2
ELECTRODE REM PT RTRN 9FT ADLT (ELECTROSURGICAL) ×1 IMPLANT
EVACUATOR SMOKE 8.L (FILTER) ×2 IMPLANT
FORMALIN 10 PREFIL 120ML (MISCELLANEOUS) ×2 IMPLANT
GLOVE BIOGEL PI IND STRL 7.0 (GLOVE) ×2 IMPLANT
GLOVE BIOGEL PI IND STRL 7.5 (GLOVE) IMPLANT
GLOVE BIOGEL PI INDICATOR 7.0 (GLOVE) ×3
GLOVE BIOGEL PI INDICATOR 7.5 (GLOVE) ×2
GLOVE ECLIPSE 7.0 STRL STRAW (GLOVE) ×1 IMPLANT
GLOVE SURG SS PI 7.5 STRL IVOR (GLOVE) ×2 IMPLANT
GOWN STRL REUS W/ TWL XL LVL3 (GOWN DISPOSABLE) ×1 IMPLANT
GOWN STRL REUS W/TWL LRG LVL3 (GOWN DISPOSABLE) ×2 IMPLANT
GOWN STRL REUS W/TWL XL LVL3 (GOWN DISPOSABLE) ×2
INST SET LAPROSCOPIC AP (KITS) ×2 IMPLANT
IV NS IRRIG 3000ML ARTHROMATIC (IV SOLUTION) ×1 IMPLANT
KIT TURNOVER KIT A (KITS) ×2 IMPLANT
MANIFOLD NEPTUNE II (INSTRUMENTS) ×2 IMPLANT
NDL HYPO 18GX1.5 BLUNT FILL (NEEDLE) ×1 IMPLANT
NDL HYPO 25X1 1.5 SAFETY (NEEDLE) ×1 IMPLANT
NDL INSUFFLATION 14GA 120MM (NEEDLE) ×1 IMPLANT
NEEDLE HYPO 18GX1.5 BLUNT FILL (NEEDLE) ×2 IMPLANT
NEEDLE HYPO 25X1 1.5 SAFETY (NEEDLE) ×2 IMPLANT
NEEDLE INSUFFLATION 14GA 120MM (NEEDLE) ×2 IMPLANT
NS IRRIG 1000ML POUR BTL (IV SOLUTION) ×2 IMPLANT
PACK LAP CHOLE LZT030E (CUSTOM PROCEDURE TRAY) ×2 IMPLANT
PAD ARMBOARD 7.5X6 YLW CONV (MISCELLANEOUS) ×2 IMPLANT
PENCIL HANDSWITCHING (ELECTRODE) ×2 IMPLANT
RELOAD 45 VASCULAR/THIN (ENDOMECHANICALS) IMPLANT
RELOAD STAPLE 45 2.5 WHT GRN (ENDOMECHANICALS) IMPLANT
RELOAD STAPLE 45 3.5 BLU ETS (ENDOMECHANICALS) IMPLANT
RELOAD STAPLE TA45 3.5 REG BLU (ENDOMECHANICALS) ×2 IMPLANT
SET BASIN LINEN APH (SET/KITS/TRAYS/PACK) ×2 IMPLANT
SET TUBE IRRIG SUCTION NO TIP (IRRIGATION / IRRIGATOR) ×1 IMPLANT
SHEARS HARMONIC ACE PLUS 36CM (ENDOMECHANICALS) ×2 IMPLANT
SPONGE GAUZE 2X2 8PLY STRL LF (GAUZE/BANDAGES/DRESSINGS) ×6 IMPLANT
STAPLER VISISTAT (STAPLE) ×2 IMPLANT
SUT VICRYL 0 UR6 27IN ABS (SUTURE) ×2 IMPLANT
SYR 20CC LL (SYRINGE) ×4 IMPLANT
SYS BAG RETRIEVAL 10MM (BASKET) ×1
SYSTEM BAG RETRIEVAL 10MM (BASKET) ×1 IMPLANT
TAPE CLOTH SURG 4X10 WHT LF (GAUZE/BANDAGES/DRESSINGS) ×1 IMPLANT
TRAY FOLEY CATH SILVER 16FR (SET/KITS/TRAYS/PACK) ×2 IMPLANT
TROCAR ENDO BLADELESS 11MM (ENDOMECHANICALS) ×2 IMPLANT
TROCAR ENDO BLADELESS 12MM (ENDOMECHANICALS) ×2 IMPLANT
TROCAR XCEL NON-BLD 5MMX100MML (ENDOMECHANICALS) ×2 IMPLANT
TUBING INSUFFLATION (TUBING) ×2 IMPLANT
WARMER LAPAROSCOPE (MISCELLANEOUS) ×2 IMPLANT
YANKAUER SUCT 12FT TUBE ARGYLE (SUCTIONS) ×2 IMPLANT
YANKAUER SUCT BULB TIP 10FT TU (MISCELLANEOUS) ×1 IMPLANT

## 2017-12-13 NOTE — ED Triage Notes (Signed)
Pt brought in by [facility]. Pt reports that he started a new med called Ocrevus for his MS. It was given IV at [facility] on Wednesday. And pain began Wednesday night. Pt reports pain is constant and chest feels tight. Only nauseated at this time

## 2017-12-13 NOTE — ED Notes (Signed)
Advised patient we needed urine specimen. 

## 2017-12-13 NOTE — ED Notes (Signed)
Permit signed by OR RN and pt  Call to security for pt valuables

## 2017-12-13 NOTE — Anesthesia Preprocedure Evaluation (Signed)
Anesthesia Evaluation  Patient identified by MRN, date of birth, ID band Patient awake    Reviewed: Allergy & Precautions, H&P , NPO status , Patient's Chart, lab work & pertinent test results  Airway Mallampati: II  TM Distance: >3 FB Neck ROM: full    Dental no notable dental hx.    Pulmonary neg pulmonary ROS,    Pulmonary exam normal breath sounds clear to auscultation       Cardiovascular Exercise Tolerance: Good hypertension, + Past MI  negative cardio ROS   Rhythm:regular Rate:Normal     Neuro/Psych PSYCHIATRIC DISORDERS Anxiety Depression negative neurological ROS  negative psych ROS   GI/Hepatic negative GI ROS, Neg liver ROS,   Endo/Other  negative endocrine ROSdiabetes  Renal/GU negative Renal ROS  negative genitourinary   Musculoskeletal   Abdominal   Peds  Hematology negative hematology ROS (+) HIV,   Anesthesia Other Findings   Reproductive/Obstetrics negative OB ROS                             Anesthesia Physical Anesthesia Plan  ASA: III  Anesthesia Plan: General   Post-op Pain Management:    Induction:   PONV Risk Score and Plan:   Airway Management Planned:   Additional Equipment:   Intra-op Plan:   Post-operative Plan:   Informed Consent: I have reviewed the patients History and Physical, chart, labs and discussed the procedure including the risks, benefits and alternatives for the proposed anesthesia with the patient or authorized representative who has indicated his/her understanding and acceptance.   Dental Advisory Given  Plan Discussed with: CRNA  Anesthesia Plan Comments:         Anesthesia Quick Evaluation

## 2017-12-13 NOTE — Op Note (Signed)
Patient:  Kerry Solis  DOB:  08-Nov-1945  MRN:  [id number]   Preop Diagnosis: Acute appendicitis  Postop Diagnosis: Same  Procedure: Laparoscopic appendectomy  Surgeon: Aviva Signs, MD  Anes: General endotracheal  Indications: Patient is a 72 year old black male who presents with right lower quadrant abdominal pain.  CT scan of the abdomen reveals acute appendicitis.  Patient now presents to the operating room for laparoscopic appendectomy.  The risks and benefits of the procedure including bleeding, infection, and the possibility of an open procedure were fully explained to the patient, who gave informed consent.  Procedure note: The patient was placed in the supine position.  After induction of general endotracheal anesthesia, the abdomen was prepped and draped using the usual sterile technique with ChloraPrep.  Surgical site confirmation was performed.  A supraumbilical incision was made down to the fascia.  A Veress needle was introduced into the abdominal cavity and confirmation of placement was done using the saline drop test.  The abdomen was then insufflated to 16 mmHg pressure.  An 11 mm trocar was introduced into the abdominal cavity under direct visualization without difficulty.  The patient was placed in deeper Trendelenburg position and an additional 12 mm trocar was placed in suprapubic region and a 5 mm trocar was placed left lower quadrant region.  The appendix was visualized and noted to be necrotic throughout its length with a small area of disruption of the distal tip of the appendix.  The mesoappendix was divided using the harmonic scalpel.  A standard Endo GIA was placed across the base the appendix at the juncture of the appendix to the cecum.  The appendix was then remov using an Endo Catch bag without difficulty.  The staple line was inspected and noted intact.  Several small feculent material was seen and this was removed without difficulty.  Right lower quadrant was  then copiously irrigated with normal saline.  All fluid and air were then evacuated from the abdominal cavity prior to the removal of the trochars.  All wounds were irrigated with normal saline.  All wounds were injected with 0.5% Sensorcaine.  The supraumbilical fascia was reapproximated using an 0 Vicryl interrupted suture.  Exparel was instilled into the surrounding wound.  The incisions were then closed using staples.  Betadine ointment and dry sterile dressings were applied.  All tape and needle counts were correct at the end of the procedure.  The patient was extubated in the operating room and transferred to PACU in stable condition.  Complications: None  EBL: Minimal  Specimen: Appendix

## 2017-12-13 NOTE — Transfer of Care (Signed)
Immediate Anesthesia Transfer of Care Note  Patient: Kerry Solis  Procedure(s) Performed: APPENDECTOMY LAPAROSCOPIC (N/A )  Patient Location: PACU  Anesthesia Type:General  Level of Consciousness: oriented, drowsy and patient cooperative  Airway & Oxygen Therapy: Patient Spontanous Breathing and Patient connected to face mask oxygen  Post-op Assessment: Report given to RN and Post -op Vital signs reviewed and stable  Post vital signs: Reviewed and stable  Last Vitals:  Vitals Value Taken Time  BP 101/36 12/13/2017  6:49 PM  Temp    Pulse 78 12/13/2017  6:51 PM  Resp 38 12/13/2017  6:51 PM  SpO2 97 % 12/13/2017  6:51 PM  Vitals shown include unvalidated device data.  Last Pain:  Vitals:   12/13/17 1514  TempSrc:   PainSc: 3          Complications: No apparent anesthesia complications

## 2017-12-13 NOTE — ED Provider Notes (Signed)
[facility] EMERGENCY DEPARTMENT Provider Note   CSN: [id number] Arrival date & time: 12/13/17  1316     History   Chief Complaint Chief Complaint  Patient presents with  . Abdominal Pain    HPI Kerry Solis is a 72 y.o. male.  Patient presents with lower abdominal pain since receiving all previous.  Cold patient that hospitalist     Past Medical History:  Diagnosis Date  . Anxiety   . Arthritis   . 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 Depression   . Elevated liver enzymes   . Glaucoma   . High triglycerides   . History of gout   . HIV (human immunodeficiency virus infection) (Kerry Solis)   . Hypertension   . Leukopenia    Low CD4  . Multiple sclerosis (Kerry Solis)    uses a cane  . Myocardial infarction (Kerry Solis)    45 years ago  . Prostate cancer [facility])     Patient Active Problem List   Diagnosis Date Noted  . Lesion of pancreas 04/24/2017  . Gastritis due to nonsteroidal anti-inflammatory drug (NSAID)   . Dysphagia 07/26/2016  . Prostate cancer (Kerry Solis) 06/27/2016  . Fall at home 03/15/2016  . Tibial plateau fracture, right 03/15/2016  . Inability to walk 03/15/2016  . Right medial tibial plateau fracture 03/15/2016  . Multiple sclerosis (Kerry Solis)   . Fall   . Diabetes type 2, controlled (Kerry Solis) 10/05/2015  . Gout 10/05/2015  . Fatty liver 06/24/2015  . Hx of adenomatous colonic polyps 03/31/2015  . FH: colon cancer 03/31/2015  . Prediabetes 12/04/2013  . Hyperlipemia 03/11/2013  . Hyperglycemia 03/11/2013  . Stress fracture 01/14/2013  . Knee pain, chronic 01/14/2013  . RECTAL BLEEDING 02/15/2009  . HIV INFECTION 02/14/2009  . UNSPECIFIED VISUAL LOSS 02/14/2009  . Essential hypertension 02/14/2009  . CONSTIPATION 02/14/2009  . ABDOMINAL PAIN 02/14/2009  . Elevated transaminase level 02/14/2009  . HX OF GALLSTONE 02/14/2009    Past Surgical History:  Procedure Laterality Date  . BIOPSY  08/14/2016   Procedure: BIOPSY;  Surgeon: Danie Binder, MD;  [facility];  Service: Endoscopy;;  gastric bx's  . CHOLECYSTECTOMY  06/10/2011   Procedure: LAPAROSCOPIC CHOLECYSTECTOMY;  Surgeon: Donato Heinz, MD;  Location: [facility] ORS;  Service: General;  Laterality: N/A;  . COLONOSCOPY  03/10/2009   URK:[id number] internal hemorrhoids/6-mm sessile ascending colon polyp/tortuous colon, diverticulosis. TA  . COLONOSCOPY WITH PROPOFOL N/A 04/19/2015   Dr. Oneida Alar: sessile serrated adenoma, surveillance in 5 years   . ESOPHAGOGASTRODUODENOSCOPY  06/23/2008   EGB:TDVVOHY gastritis/schatzki ring  . ESOPHAGOGASTRODUODENOSCOPY (EGD) WITH PROPOFOL N/A 08/14/2016   Dr. Oneida Alar: moderate Schatzi's ring s/p dilation, LA Grade A esophagitis,small hiatal hernia, chronic gastritis (negative H.pylori), one duodenal diverticulum  . NECK SURGERY  unsure   disc  . POLYPECTOMY N/A 04/19/2015   Procedure: POLYPECTOMY;  Surgeon: Danie Binder, MD;  Location: [facility] OR[facility];  Service: Endoscopy;  Laterality: N/A;  ascending colon  . PROSTATE BIOPSY    . RADIOACTIVE SEED IMPLANT N/A 11/29/2016   Procedure: RADIOACTIVE SEED IMPLANT/BRACHYTHERAPY IMPLANT;  Surgeon: Franchot Gallo, MD;  Location: [facility];  Service: Urology;  Laterality: N/A;  81 seeds implanted  . SAVORY DILATION N/A 08/14/2016   Procedure: SAVORY DILATION;  Surgeon: Danie Binder, MD;  Location[facility];  Service: Endoscopy;  Laterality: N/A;        Home Medications    Prior to Admission  medications   Medication Sig Start Date End Date Taking? Authorizing Provider  acetaminophen (TYLENOL) 500 MG tablet Take 1,000 mg by mouth 3 (three) times daily as needed for mild pain or moderate pain.   Yes [provider]  allopurinol (ZYLOPRIM) 100 MG tablet Take 1 tablet (100 mg total) by mouth daily. 08/29/17  Yes Luking, Elayne Snare, MD  amitriptyline (ELAVIL) 25 MG tablet TAKE TWO TABLETS BY MOUTH DAILY AT BEDTIME 12/11/14  Yes [provider]  amLODipine  (NORVASC) 5 MG tablet 1 qd Patient taking differently: Take 5 mg by mouth daily. 1 qd 11/12/17  Yes Kathyrn Drown, MD  aspirin EC 81 MG tablet Take 81 mg by mouth daily.   Yes [provider]  carbamazepine (TEGRETOL) 100 MG chewable tablet Chew daily by mouth.    Yes [provider]  chlorzoxazone (PARAFON) 500 MG tablet Take by mouth 4 (four) times daily as needed for muscle spasms.   Yes [provider]  dolutegravir (TIVICAY) 50 MG tablet TAKE 1 TABLET BY MOUTH EVERY DAY. STOP ATRIPLA 11/21/16  Yes [provider]  folic acid (FOLVITE) 1 MG tablet Take 1 mg by mouth daily.   Yes [provider]  latanoprost (XALATAN) 0.005 % ophthalmic solution Place 1 drop at bedtime into both eyes.  07/15/14  Yes [provider]  lisinopril-hydrochlorothiazide (PRINZIDE,ZESTORETIC) 10-12.5 MG tablet 1 qd Patient taking differently: Take 1 tablet by mouth daily. 1 qd 11/12/17  Yes Luking, Elayne Snare, MD  oxyCODONE (ROXICODONE) 5 MG immediate release tablet Take 1 tablet (5 mg total) by mouth every 6 (six) hours as needed for severe pain. 10/30/16  Yes Luking, Elayne Snare, MD  polyethylene glycol powder (GLYCOLAX/MIRALAX) powder Take 17 g by mouth daily. Patient taking differently: Take 17 g by mouth as needed.  02/28/17  Yes Kathyrn Drown, MD  pravastatin (PRAVACHOL) 20 MG tablet Take 1 tablet (20 mg total) by mouth daily. 08/29/17  Yes Luking, Elayne Snare, MD  sertraline (ZOLOFT) 100 MG tablet TAKE 1 TABLET BY MOUTH ONCE DAILY. 05/23/17  Yes Kathyrn Drown, MD  traMADol (ULTRAM) 50 MG tablet Take 1 tablet (50 mg total) by mouth every 6 (six) hours as needed. 11/29/16  Yes Franchot Gallo, MD    Family History Family History  Problem Relation Age of Onset  . Hypertension Mother   . Heart attack Mother   . Heart attack Father   . Cancer Sister        Breast  . Diabetes Sister   . Diabetes Brother   . Cancer Brother        unknown  . Cancer Brother         colon  . Cancer Sister        breast  . Cancer Brother        prostate  . Cancer Brother        colon    Social History Social History   Tobacco Use  . Smoking status: Never Smoker  . Smokeless tobacco: Never Used  Substance Use Topics  . Alcohol use: No  . Drug use: No     Allergies   Yellow jacket venom   Review of Systems Review of Systems  Constitutional: Positive for appetite change. Negative for chills and fever.  HENT: Negative for congestion.   Eyes: Negative for visual disturbance.  Respiratory: Negative for shortness of breath.   Cardiovascular: Negative for chest pain.  Gastrointestinal: Positive for abdominal pain. Negative  for vomiting.  Genitourinary: Negative for dysuria and flank pain.  Musculoskeletal: Negative for back pain, neck pain and neck stiffness.  Skin: Negative for rash.  Neurological: Negative for light-headedness and headaches.     Physical Exam Updated Vital Signs BP 131/71   Pulse 88   Temp 98.8 F (37.1 C) (Oral)   Resp (!) 24   Ht 5\' 9"  (1.753 m)   Wt 93 kg (205 lb)   SpO2 99%   BMI 30.27 kg/m   Physical Exam  Constitutional: He is oriented to person, place, and time. He appears well-developed and well-nourished.  HENT:  Head: Normocephalic and atraumatic.  Eyes: Conjunctivae are normal. Right eye exhibits no discharge. Left eye exhibits no discharge.  Neck: Normal range of motion. Neck supple. No tracheal deviation present.  Cardiovascular: Normal rate and regular rhythm.  Pulmonary/Chest: Effort normal and breath sounds normal.  Abdominal: Soft. He exhibits no distension. There is tenderness (lower abd). There is no guarding.  Musculoskeletal: He exhibits no edema.  Neurological: He is alert and oriented to person, place, and time.  Skin: Skin is warm. No rash noted.  Psychiatric: He has a normal mood and affect.  Nursing note and vitals reviewed.    ED Treatments / Results  Labs (all labs ordered are listed,  but only abnormal results are displayed) Labs Reviewed  COMPREHENSIVE METABOLIC PANEL - Abnormal; Notable for the following components:      Result Value   Glucose, Bld 124 (*)    Creatinine, Ser 1.25 (*)    Total Protein 8.4 (*)    GFR calc non Af Amer 55 (*)    All other components within normal limits  LIPASE, BLOOD  CBC  TROPONIN I  URINALYSIS, ROUTINE W REFLEX MICROSCOPIC    EKG EKG Interpretation  Date/Time:  Friday December 13 2017 13:26:20 EDT Ventricular Rate:  85 PR Interval:    QRS Duration: 100 QT Interval:  394 QTC Calculation: 469 R Axis:   -54 Text Interpretation:  Sinus rhythm Borderline prolonged PR interval Left anterior fascicular block Low voltage, precordial leads Borderline T abnormalities, anterior leads Confirmed by Elnora Morrison [phone]) on 12/13/2017 1:50:48 PM   Radiology Ct Abdomen Pelvis W Contrast  Result Date: 12/13/2017 CLINICAL DATA:  Abdominal pain.  History of prostate cancer EXAM: CT ABDOMEN AND PELVIS WITH CONTRAST TECHNIQUE: Multidetector CT imaging of the abdomen and pelvis was performed using the standard protocol following bolus administration of intravenous contrast. CONTRAST:  135mL ISOVUE-300 IOPAMIDOL (ISOVUE-300) INJECTION 61% COMPARISON:  CT abdomen 03/19/2017 FINDINGS: Lower chest: Lung bases clear.  Calcified granuloma left lung base. Hepatobiliary: Fatty infiltration of liver. No focal liver lesion. Cholecystectomy without biliary dilatation. Pancreas: Negative Spleen: Negative Adrenals/Urinary Tract: No renal mass or obstruction. 3 mm nonobstructing stone right midpole. 9 mm nonobstructing stone left lower pole. Left lower pole cyst unchanged from the prior study. Small right lower pole cyst also unchanged. Normal urinary bladder. Stomach/Bowel: Markedly dilated appendix with fluid-filled lumen containing multiple densities compatible with appendicoliths. Periappendiceal stranding. Dilated appendix measures 18 mm in diameter which is  significantly changed from the prior study. No abscess or free fluid. No bowel obstruction. Sigmoid diverticulosis without diverticulitis. Vascular/Lymphatic: Atherosclerotic disease without aneurysm. No lymphadenopathy. Reproductive: Radioactive seeds in the prostate without enlargement of the prostate Other: Negative for hernia Musculoskeletal: Negative IMPRESSION: 1. Acute appendicitis. The appendix is markedly dilated with periappendiceal stranding and appendicoliths. No abscess. 2. Nonobstructing bilateral renal calculi 3. Cholecystectomy Electronically Signed   By: Juanda Crumble  Carlis Abbott M.D.   On: 12/13/2017 15:24    Procedures .Critical Care Performed by: Elnora Morrison, MD Authorized by: Elnora Morrison, MD   Critical care provider statement:    Critical care time (minutes):  35   Critical care start time:  12/13/2017 3:00 PM   Critical care end time:  12/13/2017 3:35 PM   Critical care time was exclusive of:  Separately billable procedures and treating other patients and teaching time   Critical care was necessary to treat or prevent imminent or life-threatening deterioration of the following conditions:  Sepsis   Critical care was time spent personally by me on the following activities:  Examination of patient, evaluation of patient's response to treatment, ordering and review of laboratory studies, ordering and review of radiographic studies and discussions with consultants   I assumed direction of critical care for this patient from another provider in my specialty: no     (including critical care time)  Medications Ordered in ED Medications  fentaNYL (SUBLIMAZE) injection 50 mcg (50 mcg Intravenous Given 12/13/17 1411)  piperacillin-tazobactam (ZOSYN) IVPB 3.375 g (has no administration in time range)  0.9 %  sodium chloride infusion (has no administration in time range)  sodium chloride 0.9 % bolus 500 mL (0 mLs Intravenous Stopped 12/13/17 1514)  ondansetron (ZOFRAN) injection 4 mg (4 mg  Intravenous Given 12/13/17 1409)  iopamidol (ISOVUE-300) 61 % injection 100 mL (100 mLs Intravenous Contrast Given 12/13/17 1510)     Initial Impression / Assessment and Plan / ED Course  I have reviewed the triage vital signs and the nursing notes.  Pertinent labs & imaging results that were available during my care of the patient were reviewed by me and considered in my medical decision making (see chart for details).     Patient presents with worsening lower abdominal pain.  With age and no improvement discussed may be secondary from his medication versus other pathologies including appendix/colon.  CT scan showed acute appendicitis.  Pain is gradually worsening plan for repeat pain meds, IV Zosyn ordered.   discussed with general surgeon who will take the patient to the operating room urgently.  The patients results and plan were reviewed and discussed.   Any x-rays performed were independently reviewed by myself.   Differential diagnosis were considered with the presenting HPI.  Medications  fentaNYL (SUBLIMAZE) injection 50 mcg (50 mcg Intravenous Given 12/13/17 1411)  piperacillin-tazobactam (ZOSYN) IVPB 3.375 g (has no administration in time range)  0.9 %  sodium chloride infusion (has no administration in time range)  sodium chloride 0.9 % bolus 500 mL (0 mLs Intravenous Stopped 12/13/17 1514)  ondansetron (ZOFRAN) injection 4 mg (4 mg Intravenous Given 12/13/17 1409)  iopamidol (ISOVUE-300) 61 % injection 100 mL (100 mLs Intravenous Contrast Given 12/13/17 1510)    Vitals:   12/13/17 1321 12/13/17 1322 12/13/17 1400 12/13/17 1500  BP: (!) 150/70  (!) 128/91 131/71  Pulse: 92  88   Resp: 20  17 (!) 24  Temp: 98.8 F (37.1 C)     TempSrc: Oral     SpO2: 98%  99%   Weight:  93 kg (205 lb)    Height:  5\' 9"  (1.753 m)      Final diagnoses:  Acute appendicitis, unspecified acute appendicitis type    Admission/ observation were discussed with the admitting physician, patient  and/or family and they are comfortable with the plan.  He needs  Final Clinical Impressions(s) / ED Diagnoses   Final diagnoses:  Acute appendicitis, unspecified acute appendicitis type    ED Discharge Orders    None       Elnora Morrison, MD 12/13/17 [phone]

## 2017-12-13 NOTE — H&P (Signed)
Kerry Solis is an 72 y.o. male.   Chief Complaint: Right lower quadrant abdominal pain HPI: Patient is a 72 year old black male who presented to emergency room with worsening right lower quadrant abdominal pain.  It started several days ago.  CT scan of the abdomen reveals acute appendicitis with appendicoliths.  Patient has had some chills, but no fever.  He does have decreased appetite.  He has a 5 out of 10 abdominal pain.  Past Medical History:  Diagnosis Date  . Anxiety   . Arthritis   . 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 Depression   . Elevated liver enzymes   . Glaucoma   . High triglycerides   . History of gout   . HIV (human immunodeficiency virus infection) ([place])   . Hypertension   . Leukopenia    Low CD4  . Multiple sclerosis ([place])    uses a cane  . Myocardial infarction ([place])    45 years ago  . Prostate cancer [facility])     Past Surgical History:  Procedure Laterality Date  . BIOPSY  08/14/2016   Procedure: BIOPSY;  Surgeon: Danie Binder, MD;  [facility];  Service: Endoscopy;;  gastric bx's  . CHOLECYSTECTOMY  06/10/2011   Procedure: LAPAROSCOPIC CHOLECYSTECTOMY;  Surgeon: Donato Heinz, MD;  Location: [facility];  Service: General;  Laterality: N/A;  . COLONOSCOPY  03/10/2009   PJK:[id number] internal hemorrhoids/6-mm sessile ascending colon polyp/tortuous colon, diverticulosis. TA  . COLONOSCOPY WITH PROPOFOL N/A 04/19/2015   Dr. Oneida Alar: sessile serrated adenoma, surveillance in 5 years   . ESOPHAGOGASTRODUODENOSCOPY  06/23/2008   PYK:DXIPJAS gastritis/schatzki ring  . ESOPHAGOGASTRODUODENOSCOPY (EGD) WITH PROPOFOL N/A 08/14/2016   Dr. Oneida Alar: moderate Schatzi's ring s/p dilation, LA Grade A esophagitis,small hiatal hernia, chronic gastritis (negative H.pylori), one duodenal diverticulum  . NECK SURGERY  unsure   disc  . POLYPECTOMY N/A 04/19/2015   Procedure: POLYPECTOMY;  Surgeon: Danie Binder, MD;  Location: [facility];   Service: Endoscopy;  Laterality: N/A;  ascending colon  . PROSTATE BIOPSY    . RADIOACTIVE SEED IMPLANT N/A 11/29/2016   Procedure: RADIOACTIVE SEED IMPLANT/BRACHYTHERAPY IMPLANT;  Surgeon: Franchot Gallo, MD;  Location: [facility];  Service: Urology;  Laterality: N/A;  81 seeds implanted  . SAVORY DILATION N/A 08/14/2016   Procedure: SAVORY DILATION;  Surgeon: Danie Binder, MD;  [facility];  Service: Endoscopy;  Laterality: N/A;    Family History  Problem Relation Age of Onset  . Hypertension Mother   . Heart attack Mother   . Heart attack Father   . Cancer Sister        Breast  . Diabetes Sister   . Diabetes Brother   . Cancer Brother        unknown  . Cancer Brother        colon  . Cancer Sister        breast  . Cancer Brother        prostate  . Cancer Brother        colon   Social History:  reports that he has never smoked. He has never used smokeless tobacco. He reports that he does not drink alcohol or use drugs.  Allergies:  Allergies  Allergen Reactions  . Yellow Jacket Venom Swelling     (Not in a hospital admission)  Results for orders placed or performed during the hospital encounter of 12/13/17 (from the  past 48 hour(s))  Lipase, blood     Status: None   Collection Time: 12/13/17  1:25 PM  Result Value Ref Range   Lipase 36 11 - 51 U/L    Comment: Performed at [facility], [address], [place], [place] [address]  Comprehensive metabolic panel     Status: Abnormal   Collection Time: 12/13/17  1:25 PM  Result Value Ref Range   Sodium 139 135 - 145 mmol/L   Potassium 3.8 3.5 - 5.1 mmol/L   Chloride 104 98 - 111 mmol/L    Comment: Please note change in reference range.   CO2 27 22 - 32 mmol/L   Glucose, Bld 124 (H) 70 - 99 mg/dL    Comment: Please note change in reference range.   BUN 14 8 - 23 mg/dL    Comment: Please note change in reference range.   Creatinine, Ser 1.25 (H) 0.61 - 1.24 mg/dL   Calcium 9.5 8.9 -  10.3 mg/dL   Total Protein 8.4 (H) 6.5 - 8.1 g/dL   Albumin 4.5 3.5 - 5.0 g/dL   AST 39 15 - 41 U/L   ALT 38 0 - 44 U/L    Comment: Please note change in reference range.   Alkaline Phosphatase 54 38 - 126 U/L   Total Bilirubin 0.5 0.3 - 1.2 mg/dL   GFR calc non Af Amer 55 (L) >60 mL/min   GFR calc Af Amer >60 >60 mL/min    Comment: (NOTE) The eGFR has been calculated using the CKD EPI equation. This calculation has not been validated in all clinical situations. eGFR's persistently <60 mL/min signify possible Chronic Kidney Disease.    Anion gap 8 5 - 15    Comment: Performed at [facility], [address] [place], [place] [address]  CBC     Status: None   Collection Time: 12/13/17  1:25 PM  Result Value Ref Range   WBC 6.9 4.0 - 10.5 K/uL   RBC 5.01 4.22 - 5.81 MIL/uL   Hemoglobin 14.4 13.0 - 17.0 g/dL   HCT 44.0 39.0 - 52.0 %   MCV 87.8 78.0 - 100.0 fL   MCH 28.7 26.0 - 34.0 pg   MCHC 32.7 30.0 - 36.0 g/dL   RDW 14.5 11.5 - 15.5 %   Platelets 176 150 - 400 K/uL    Comment: Performed at [facility], [address] [place], [place] [address]  Troponin I     Status: None   Collection Time: 12/13/17  1:25 PM  Result Value Ref Range   Troponin I <0.03 <0.03 ng/mL    Comment: Performed at [facility], [address] [place], [place] [address]   Ct Abdomen Pelvis W Contrast  Result Date: 12/13/2017 CLINICAL DATA:  Abdominal pain.  History of prostate cancer EXAM: CT ABDOMEN AND PELVIS WITH CONTRAST TECHNIQUE: Multidetector CT imaging of the abdomen and pelvis was performed using the standard protocol following bolus administration of intravenous contrast. CONTRAST:  166m ISOVUE-300 IOPAMIDOL (ISOVUE-300) INJECTION 61% COMPARISON:  CT abdomen 03/19/2017 FINDINGS: Lower chest: Lung bases clear.  Calcified granuloma left lung base. Hepatobiliary: Fatty infiltration of liver. No focal liver lesion. Cholecystectomy without biliary dilatation. Pancreas: Negative Spleen: Negative  Adrenals/Urinary Tract: No renal mass or obstruction. 3 mm nonobstructing stone right midpole. 9 mm nonobstructing stone left lower pole. Left lower pole cyst unchanged from the prior study. Small right lower pole cyst also unchanged. Normal urinary bladder. Stomach/Bowel: Markedly dilated appendix with fluid-filled lumen  containing multiple densities compatible with appendicoliths. Periappendiceal stranding. Dilated appendix measures 18 mm in diameter which is significantly changed from the prior study. No abscess or free fluid. No bowel obstruction. Sigmoid diverticulosis without diverticulitis. Vascular/Lymphatic: Atherosclerotic disease without aneurysm. No lymphadenopathy. Reproductive: Radioactive seeds in the prostate without enlargement of the prostate Other: Negative for hernia Musculoskeletal: Negative IMPRESSION: 1. Acute appendicitis. The appendix is markedly dilated with periappendiceal stranding and appendicoliths. No abscess. 2. Nonobstructing bilateral renal calculi 3. Cholecystectomy Electronically Signed   By: Franchot Gallo M.D.   On: 12/13/2017 15:24    Review of Systems  Constitutional: Positive for malaise/fatigue.  HENT: Negative.   Eyes: Negative.   Respiratory: Negative.   Cardiovascular: Negative.   Gastrointestinal: Positive for abdominal pain and nausea. Negative for vomiting.  Genitourinary: Negative.   Musculoskeletal: Negative.   Skin: Negative.   Neurological: Negative.   Endo/Heme/Allergies: Negative.   Psychiatric/Behavioral: Negative.     Blood pressure 131/71, pulse 88, temperature 98.8 F (37.1 C), temperature source Oral, resp. rate (!) 24, height 5' 9" (1.753 m), weight 205 lb (93 kg), SpO2 99 %. Physical Exam  Vitals reviewed. Constitutional: He is oriented to person, place, and time. He appears well-developed and well-nourished.  HENT:  Head: Normocephalic and atraumatic.  Cardiovascular: Normal rate, regular rhythm and normal heart sounds. Exam  reveals no gallop and no friction rub.  No murmur heard. Respiratory: Effort normal and breath sounds normal. No respiratory distress. He has no wheezes. He has no rales.  GI: Soft. Bowel sounds are normal. He exhibits no distension. There is tenderness. There is guarding. There is no rebound.  Tender in the right lower quadrant to palpation.  No rigidity noted.  Neurological: He is alert and oriented to person, place, and time.  Skin: Skin is warm and dry.    CT scan report personally reviewed Assessment/Plan Impression: Acute appendicitis Plan: Patient will be taken to the operating room for laparoscopic appendectomy.  The risks and benefits of the procedure including bleeding, infection, and the possibility of an open procedure were fully explained to the patient, who gave informed consent.  Aviva Signs, MD 12/13/2017, 5:07 PM

## 2017-12-13 NOTE — Anesthesia Procedure Notes (Signed)
Procedure Name: Intubation Date/Time: 12/13/2017 5:42 PM Performed by: Nicanor Alcon, MD Pre-anesthesia Checklist: Patient identified, Patient being monitored, Timeout performed, Emergency Drugs available and Suction available Patient Re-evaluated:Patient Re-evaluated prior to induction Oxygen Delivery Method: Circle System Utilized Preoxygenation: Pre-oxygenation with 100% oxygen Induction Type: IV induction Ventilation: Mask ventilation without difficulty Laryngoscope Size: Mac and 3 Grade View: Grade I Tube type: Oral Tube size: 7.0 mm Number of attempts: 1 Airway Equipment and Method: Stylet Placement Confirmation: ETT inserted through vocal cords under direct vision,  positive ETCO2 and breath sounds checked- equal and bilateral Secured at: 21 cm Tube secured with: Tape Dental Injury: Teeth and Oropharynx as per pre-operative assessment

## 2017-12-13 NOTE — Anesthesia Postprocedure Evaluation (Signed)
Anesthesia Post Note  Patient: Kerry Solis  Procedure(s) Performed: APPENDECTOMY LAPAROSCOPIC (N/A )  Patient location during evaluation: PACU Anesthesia Type: General Level of consciousness: awake and alert Pain management: pain level controlled Vital Signs Assessment: post-procedure vital signs reviewed and stable Respiratory status: spontaneous breathing, nonlabored ventilation, respiratory function stable and patient connected to nasal cannula oxygen Cardiovascular status: blood pressure returned to baseline and stable Postop Assessment: no apparent nausea or vomiting Anesthetic complications: no     Last Vitals:  Vitals:   12/13/17 1900 12/13/17 1915  BP: 94/79   Pulse: 81 79  Resp: (!) 23 18  Temp:    SpO2: 96% 91%    Last Pain:  Vitals:   12/13/17 1845  TempSrc:   PainSc: 0-No pain                 Nicanor Alcon

## 2017-12-14 DIAGNOSIS — K3580 Unspecified acute appendicitis: Secondary | ICD-10-CM | POA: Diagnosis not present

## 2017-12-14 LAB — BASIC METABOLIC PANEL
Anion gap: 10 (ref 5–15)
BUN: 21 mg/dL (ref 8–23)
CO2: 22 mmol/L (ref 22–32)
Calcium: 8.5 mg/dL — ABNORMAL LOW (ref 8.9–10.3)
Chloride: 105 mmol/L (ref 98–111)
Creatinine, Ser: 1.62 mg/dL — ABNORMAL HIGH (ref 0.61–1.24)
GFR calc Af Amer: 47 mL/min — ABNORMAL LOW (ref >60)
GFR calc non Af Amer: 40 mL/min — ABNORMAL LOW (ref >60)
Glucose, Bld: 174 mg/dL — ABNORMAL HIGH (ref 70–99)
Potassium: 4.2 mmol/L (ref 3.5–5.1)
Sodium: 137 mmol/L (ref 135–145)

## 2017-12-14 LAB — CBC
HCT: 39.5 % (ref 39.0–52.0)
Hemoglobin: 12.6 g/dL — ABNORMAL LOW (ref 13.0–17.0)
MCH: 28.2 pg (ref 26.0–34.0)
MCHC: 31.9 g/dL (ref 30.0–36.0)
MCV: 88.4 fL (ref 78.0–100.0)
Platelets: 154 10*3/uL (ref 150–400)
RBC: 4.47 MIL/uL (ref 4.22–5.81)
RDW: 14.8 % (ref 11.5–15.5)
WBC: 10.5 10*3/uL (ref 4.0–10.5)

## 2017-12-14 MED ORDER — OXYCODONE HCL 5 MG PO TABS
5.0000 mg | ORAL_TABLET | Freq: Four times a day (QID) | ORAL | 0 refills | Status: DC | PRN
Start: 2017-12-14 — End: 2018-07-16

## 2017-12-14 NOTE — Progress Notes (Signed)
Foley not present on assessment.  Patient has voided in urinal this morning.

## 2017-12-14 NOTE — Addendum Note (Signed)
Addendum  created 12/14/17 0952 by Charmaine Downs, CRNA   Sign clinical note

## 2017-12-14 NOTE — Anesthesia Postprocedure Evaluation (Signed)
Anesthesia Post Note  Patient: Kerry Solis  Procedure(s) Performed: APPENDECTOMY LAPAROSCOPIC (N/A )  Patient location during evaluation: Nursing Unit Anesthesia Type: General Level of consciousness: awake and alert and patient cooperative Pain management: pain level controlled Vital Signs Assessment: post-procedure vital signs reviewed and stable Respiratory status: spontaneous breathing, nonlabored ventilation and respiratory function stable Cardiovascular status: blood pressure returned to baseline Postop Assessment: no apparent nausea or vomiting Anesthetic complications: no     Last Vitals:  Vitals:   12/14/17 0205 12/14/17 0553  BP: 112/61 (!) 96/48  Pulse: 60 65  Resp:    Temp:  36.8 C  SpO2:  93%    Last Pain:  Vitals:   12/14/17 0746  TempSrc:   PainSc: 0-No pain                 Hunter Pinkard J

## 2017-12-14 NOTE — Discharge Summary (Signed)
Physician Discharge Summary  Patient ID: Kerry Solis MRN: [id number] DOB/AGE: 74-May-1947 74 y.o.  Admit date: 12/13/2017 Discharge date: 12/14/2017  Admission Diagnoses: Acute appendicitis Discharge Diagnoses: Same Active Problems:   Acute appendicitis   S/P laparoscopic appendectomy HIV-positive, arthritis, hypertension, multiple sclerosis, history of prostate cancer  Discharged Condition: good  Hospital Course: Patient is a 72 year old black male with multiple medical problems who presented to the emergency room with right lower quadrant abdominal pain.  CT scan of the abdomen revealed acute appendicitis.  Patient underwent laparoscopic appendectomy on 12/13/2017.  He tolerated the procedure well.  His postoperative course has been unremarkable.  His diet was advanced without difficulty.  He is taking fluids in.  He is being discharged home on 12/14/2017 in good and improving condition.  Treatments: surgery: Laparoscopic appendectomy on 12/13/2017  Discharge Exam: Blood pressure (!) 96/48, pulse 65, temperature 98.3 F (36.8 C), temperature source Oral, resp. rate 20, height 5\' 9"  (1.753 m), weight 205 lb (93 kg), SpO2 93 %. General appearance: alert, cooperative and no distress Resp: clear to auscultation bilaterally Cardio: regular rate and rhythm, S1, S2 normal, no murmur, click, rub or gallop GI: Soft, bowel sounds active.  Dressing is dry and intact.  Disposition: Discharge disposition: 01-Home or Self Care       Discharge Instructions    Diet - low sodium heart healthy   Complete by:  As directed    Increase activity slowly   Complete by:  As directed      Allergies as of 12/14/2017      Reactions   Yellow Jacket Venom Swelling      Medication List    TAKE these medications   acetaminophen 500 MG tablet Commonly known as:  TYLENOL Take 1,000 mg by mouth 3 (three) times daily as needed for mild pain or moderate pain.   allopurinol 100 MG tablet Commonly  known as:  ZYLOPRIM Take 1 tablet (100 mg total) by mouth daily.   amitriptyline 25 MG tablet Commonly known as:  ELAVIL TAKE TWO TABLETS BY MOUTH DAILY AT BEDTIME   amLODipine 5 MG tablet Commonly known as:  NORVASC 1 qd What changed:    how much to take  how to take this  when to take this  additional instructions   aspirin EC 81 MG tablet Take 81 mg by mouth daily.   carbamazepine 100 MG chewable tablet Commonly known as:  TEGRETOL Chew daily by mouth.   chlorzoxazone 500 MG tablet Commonly known as:  PARAFON Take by mouth 4 (four) times daily as needed for muscle spasms.   folic acid 1 MG tablet Commonly known as:  FOLVITE Take 1 mg by mouth daily.   latanoprost 0.005 % ophthalmic solution Commonly known as:  XALATAN Place 1 drop at bedtime into both eyes.   lisinopril-hydrochlorothiazide 10-12.5 MG tablet Commonly known as:  PRINZIDE,ZESTORETIC 1 qd What changed:    how much to take  how to take this  when to take this  additional instructions   oxyCODONE 5 MG immediate release tablet Commonly known as:  ROXICODONE Take 1 tablet (5 mg total) by mouth every 6 (six) hours as needed. What changed:  reasons to take this   polyethylene glycol powder powder Commonly known as:  GLYCOLAX/MIRALAX Take 17 g by mouth daily. What changed:    when to take this  reasons to take this   pravastatin 20 MG tablet Commonly known as:  PRAVACHOL Take 1 tablet (20 mg total)  by mouth daily.   sertraline 100 MG tablet Commonly known as:  ZOLOFT TAKE 1 TABLET BY MOUTH ONCE DAILY.   TIVICAY 50 MG tablet Generic drug:  dolutegravir TAKE 1 TABLET BY MOUTH EVERY DAY. STOP ATRIPLA   traMADol 50 MG tablet Commonly known as:  ULTRAM Take 1 tablet (50 mg total) by mouth every 6 (six) hours as needed.      Follow-up Information    Aviva Signs, MD. Schedule an appointment as soon as possible for a visit on 12/24/2017.   Specialty:  General Surgery Contact  information: [id number] [place] [id number] [phone]           Signed: Aviva Signs 12/14/2017, 8:22 AM

## 2017-12-14 NOTE — Progress Notes (Signed)
Patient's IV removed.  Site WNL.  AVS reviewed with patient.  Verbalized understanding of discharge instructions, physician follow-up, medications, incision care.  Patient transported by w/c to main entrance at discharge.  Patient stable at time of discharge.

## 2017-12-14 NOTE — Discharge Instructions (Signed)
Laparoscopic Appendectomy, Adult, Care After °Refer to this sheet in the next few weeks. These instructions provide you with information about caring for yourself after your procedure. Your health care provider may also give you more specific instructions. Your treatment has been planned according to current medical practices, but problems sometimes occur. Call your health care provider if you have any problems or questions after your procedure. °What can I expect after the procedure? °After the procedure, it is common to have: °· A decrease in your energy level. °· Mild pain in the area where the surgical cuts (incisions) were made. °· Constipation. This can be caused by pain medicine and a decrease in your activity. ° °Follow these instructions at home: °Medicines °· Take over-the-counter and prescription medicines only as told by your health care provider. °· Do not drive for 24 hours if you received a sedative. °· Do not drive or operate heavy machinery while taking prescription pain medicine. °· If you were prescribed an antibiotic medicine, take it as told by your health care provider. Do not stop taking the antibiotic even if you start to feel better. °Activity °· For 3 weeks or as long as told by your health care provider: °? Do not lift anything that is heavier than 10 pounds (4.5 kg). °? Do not play contact sports. °· Gradually return to your normal activities. Ask your health care provider what activities are safe for you. °Bathing °· Keep your incisions clean and dry. Clean them as often as told by your health care provider: °? Gently wash the incisions with soap and water. °? Rinse the incisions with water to remove all soap. °? Pat the incisions dry with a clean towel. Do not rub the incisions. °· You may take showers after 48 hours. °· Do not take baths, swim, or use hot tubs for 2 weeks or as told by your health care provider. °Incision care °· Follow instructions from your healthcare provider about  how to take care of your incisions. Make sure you: °? Wash your hands with soap and water before you change your bandage (dressing). If soap and water are not available, use hand sanitizer. °? Change your dressing as told by your health care provider. °? Leave stitches (sutures), skin glue, or adhesive strips in place. These skin closures may need to stay in place for 2 weeks or longer. If adhesive strip edges start to loosen and curl up, you may trim the loose edges. Do not remove adhesive strips completely unless your health care provider tells you to do that. °· Check your incision areas every day for signs of infection. Check for: °? More redness, swelling, or pain. °? More fluid or blood. °? Warmth. °? Pus or a bad smell. °Other Instructions °· If you were sent home with a drain, follow instructions from your health care provider about how to care for the drain and how to empty it. °· Take deep breaths. This helps to prevent your lungs from becoming inflamed. °· To relieve and prevent constipation: °? Drink plenty of fluids. °? Eat plenty of fruits and vegetables. °· Keep all follow-up visits as told by your health care provider. This is important. °Contact a health care provider if: °· You have more redness, swelling, or pain around an incision. °· You have more fluid or blood coming from an incision. °· Your incision feels warm to the touch. °· You have pus or a bad smell coming from an incision or dressing. °· Your incision   edges break open after your sutures have been removed. °· You have increasing pain in your shoulders. °· You feel dizzy or you faint. °· You develop shortness of breath. °· You keep feeling nauseous or vomiting. °· You have diarrhea or you cannot control your bowel functions. °· You lose your appetite. °· You develop swelling or pain in your legs. °Get help right away if: °· You have a fever. °· You develop a rash. °· You have difficulty breathing. °· You have sharp pains in your  chest. °This information is not intended to replace advice given to you by your health care provider. Make sure you discuss any questions you have with your health care provider. °Document Released: 06/04/2005 Document Revised: 11/04/2015 Document Reviewed: 11/22/2014 °Elsevier Interactive Patient Education © 2018 [employer] ° °

## 2017-12-16 ENCOUNTER — Encounter (HOSPITAL_COMMUNITY): Payer: Self-pay | Admitting: General Surgery

## 2017-12-17 ENCOUNTER — Other Ambulatory Visit: Payer: Self-pay | Admitting: Family Medicine

## 2017-12-17 ENCOUNTER — Telehealth: Payer: Self-pay | Admitting: Family Medicine

## 2017-12-17 ENCOUNTER — Encounter: Payer: Self-pay | Admitting: Gastroenterology

## 2017-12-17 NOTE — Telephone Encounter (Signed)
Left message to return call 

## 2017-12-17 NOTE — Telephone Encounter (Signed)
Patient wanted to let [name] know that he had to have emergency appendicitis surgery last week.

## 2017-12-17 NOTE — Telephone Encounter (Signed)
Nurses-please let patient be aware that I am aware of his surgery the surgeon send [place] an update hope he has a quick and full recovery

## 2017-12-20 NOTE — Telephone Encounter (Signed)
Left message to return call 

## 2017-12-23 NOTE — Telephone Encounter (Signed)
Patient is aware 

## 2017-12-25 DIAGNOSIS — G35 Multiple sclerosis: Secondary | ICD-10-CM | POA: Diagnosis not present

## 2017-12-26 ENCOUNTER — Ambulatory Visit: Payer: Medicare Other | Admitting: Family Medicine

## 2017-12-26 ENCOUNTER — Encounter: Payer: Self-pay | Admitting: General Surgery

## 2017-12-26 ENCOUNTER — Ambulatory Visit (INDEPENDENT_AMBULATORY_CARE_PROVIDER_SITE_OTHER): Payer: Self-pay | Admitting: General Surgery

## 2017-12-26 VITALS — BP 168/74 | HR 94 | Temp 97.5°F | Resp 18 | Wt 205.0 lb

## 2017-12-26 DIAGNOSIS — Z09 Encounter for follow-up examination after completed treatment for conditions other than malignant neoplasm: Secondary | ICD-10-CM

## 2017-12-26 NOTE — Progress Notes (Signed)
Subjective:     Kerry Solis  Status post laparoscopic appendectomy.  Doing very well.  Has no complaints.  Looks Objective:    BP (!) 168/74 (BP Location: Left Arm, Patient Position: Sitting, Cuff Size: Large)   Pulse 94   Temp (!) 97.5 F (36.4 C) (Temporal)   Resp 18   Wt 205 lb (93 kg)   BMI 30.27 kg/m   General:  alert, cooperative and no distress  Abdomen soft, incisions healing well.  Staples removed. Final pathology consistent with diagnosis.     Assessment:    Doing well postoperatively.    Plan:   Follow-up here as needed.  May resume normal activity.

## 2018-01-15 ENCOUNTER — Other Ambulatory Visit: Payer: Self-pay | Admitting: Family Medicine

## 2018-01-15 DIAGNOSIS — Z8719 Personal history of other diseases of the digestive system: Secondary | ICD-10-CM | POA: Diagnosis not present

## 2018-01-15 DIAGNOSIS — B20 Human immunodeficiency virus [HIV] disease: Secondary | ICD-10-CM | POA: Diagnosis not present

## 2018-01-15 DIAGNOSIS — K0889 Other specified disorders of teeth and supporting structures: Secondary | ICD-10-CM | POA: Diagnosis not present

## 2018-01-15 DIAGNOSIS — Z792 Long term (current) use of antibiotics: Secondary | ICD-10-CM | POA: Diagnosis not present

## 2018-01-15 DIAGNOSIS — Z79899 Other long term (current) drug therapy: Secondary | ICD-10-CM | POA: Diagnosis not present

## 2018-01-15 DIAGNOSIS — G35 Multiple sclerosis: Secondary | ICD-10-CM | POA: Diagnosis not present

## 2018-01-15 DIAGNOSIS — F418 Other specified anxiety disorders: Secondary | ICD-10-CM | POA: Diagnosis not present

## 2018-01-15 DIAGNOSIS — Z9049 Acquired absence of other specified parts of digestive tract: Secondary | ICD-10-CM | POA: Diagnosis not present

## 2018-01-15 DIAGNOSIS — I10 Essential (primary) hypertension: Secondary | ICD-10-CM | POA: Diagnosis not present

## 2018-01-15 DIAGNOSIS — C61 Malignant neoplasm of prostate: Secondary | ICD-10-CM | POA: Diagnosis not present

## 2018-01-15 DIAGNOSIS — Z21 Asymptomatic human immunodeficiency virus [HIV] infection status: Secondary | ICD-10-CM | POA: Diagnosis not present

## 2018-03-05 DIAGNOSIS — Z79899 Other long term (current) drug therapy: Secondary | ICD-10-CM | POA: Diagnosis not present

## 2018-03-05 DIAGNOSIS — Z21 Asymptomatic human immunodeficiency virus [HIV] infection status: Secondary | ICD-10-CM | POA: Diagnosis not present

## 2018-03-05 DIAGNOSIS — G35 Multiple sclerosis: Secondary | ICD-10-CM | POA: Diagnosis not present

## 2018-03-11 ENCOUNTER — Telehealth: Payer: Self-pay | Admitting: Gastroenterology

## 2018-03-11 ENCOUNTER — Encounter: Payer: Self-pay | Admitting: Gastroenterology

## 2018-03-11 NOTE — Telephone Encounter (Signed)
We need to get patient back in for a routine follow-up. Spoke with Quinn Axe, NP with [facility].   Started Ocrevus June 27 and July 10th for MS. We need to check HBV DNA and HFP now and every 6 weeks. Please have him complete now, and let's make sure he has a follow-up upcoming with me.

## 2018-03-11 NOTE — Telephone Encounter (Signed)
Tried to call and VM not set up.  Will mail a letter for pt to call.

## 2018-03-11 NOTE — Telephone Encounter (Signed)
PATIENT SCHEDULED AND LETTER SENT  °

## 2018-03-14 ENCOUNTER — Encounter: Payer: Self-pay | Admitting: Family Medicine

## 2018-03-14 ENCOUNTER — Ambulatory Visit (INDEPENDENT_AMBULATORY_CARE_PROVIDER_SITE_OTHER): Payer: Medicare Other | Admitting: Family Medicine

## 2018-03-14 VITALS — BP 132/84 | Ht 68.0 in | Wt 211.0 lb

## 2018-03-14 DIAGNOSIS — Z23 Encounter for immunization: Secondary | ICD-10-CM

## 2018-03-14 DIAGNOSIS — I10 Essential (primary) hypertension: Secondary | ICD-10-CM

## 2018-03-14 DIAGNOSIS — E7849 Other hyperlipidemia: Secondary | ICD-10-CM

## 2018-03-14 DIAGNOSIS — E119 Type 2 diabetes mellitus without complications: Secondary | ICD-10-CM | POA: Diagnosis not present

## 2018-03-14 NOTE — Progress Notes (Signed)
Subjective:    Patient ID: Kerry Solis, male    DOB: 1946/05/02, 72 y.o.   MRN: [id number]   Patient arrives for a follow up on blood pressure. Patient currently on zestoretic and norvasc. Patient states it is time for his 6 month blood work here as well as with his MS doctor and would like to have it all drawn at one time for one stick. Patient would also like a flu shot today. Patient for blood pressure check up.  The patient does have hypertension.  The patient is on medication.  Patient relates compliance with meds. Todays BP reviewed with the patient. Patient denies issues with medication. Patient relates reasonable diet. Patient tries to minimize salt. Patient aware of BP goals.   The patient was seen today as part of a comprehensive diabetic check up.the patient does have diabetes.  The patient follows here on a regular basis.  The patient relates medication compliance. No significant side effects to the medications. Denies any low glucose spells. Relates compliance with diet to a reasonable level. Patient does do labwork intermittently and understands the dangers of diabetes.  Currently his diabetes is being treated by dietary measures  Patient here for follow-up regarding cholesterol.  The patient does have hyperlipidemia.  Patient does try to maintain a reasonable diet.  Patient does take the medication on a regular basis.  Denies missing a dose.  The patient denies any obvious side effects.  Prior blood work results reviewed with the patient.  The patient is aware of his cholesterol goals and the need to keep it under good control to lessen the risk of disease.    The patient is undergoing treatment for MS He does have underlying condition also of HIV Both of these are under fairly good control Apparently the patient states that his specialist at [facility] connected with his gastroenterology specialist recommending ongoing monitoring of hepatitis B Review of Systems  Constitutional:  Negative for activity change.  HENT: Negative for congestion and rhinorrhea.   Respiratory: Negative for cough and shortness of breath.   Cardiovascular: Negative for chest pain.  Gastrointestinal: Negative for abdominal pain, diarrhea, nausea and vomiting.  Genitourinary: Negative for dysuria and hematuria.  Neurological: Negative for weakness and headaches.  Psychiatric/Behavioral: Negative for behavioral problems and confusion.       Objective:   Physical Exam  Constitutional: He appears well-nourished. No distress.  HENT:  Head: Normocephalic and atraumatic.  Eyes: Right eye exhibits no discharge. Left eye exhibits no discharge.  Neck: No tracheal deviation present.  Cardiovascular: Normal rate, regular rhythm and normal heart sounds.  No murmur heard. Pulmonary/Chest: Effort normal and breath sounds normal. No respiratory distress.  Musculoskeletal: He exhibits no edema.  Lymphadenopathy:    He has no cervical adenopathy.  Neurological: He is alert. Coordination normal.  Skin: Skin is warm and dry.  Psychiatric: He has a normal mood and affect. His behavior is normal.  Vitals reviewed.         Assessment & Plan:  The patient was seen today as part of a comprehensive visit for diabetes. The importance of keeping her A1c at or below 7 was discussed.  Importance of regular physical activity was discussed.   The importance of adherence to medication as well as a controlled low starch/sugar diet was also discussed.  Standard follow-up visit recommended.  Also patient aware failure to keep diabetes under control increases the risk of complications.  The patient was seen today as part of  an evaluation regarding hyperlipidemia.  Recent lab work has been reviewed with the patient as well as the goals for good cholesterol care.  In addition to this medications have been discussed the importance of compliance with diet and medications discussed as well.  Finally the patient is  aware that poor control of cholesterol, noncompliance can dramatically increase the risk of complications. The patient will keep regular office visits and the patient does agreed to periodic lab work.  HTN- Patient was seen today as part of a visit regarding hypertension. The importance of healthy diet and regular physical activity was discussed. The importance of compliance with medications discussed.  Ideal goal is to keep blood pressure low elevated levels certainly below 295/62 when possible.  The patient was counseled that keeping blood pressure under control lessen his risk of complications.  The importance of regular follow-ups was discussed with the patient.  Low-salt diet such as DASH recommended.  Regular physical activity was recommended as well.  Patient was advised to keep regular follow-ups.  Patient will do his lab work in the near future  Patient states gastroenterology will be doing lab work every 8 to 12 weeks and send them to his specialist  Follow-up in 4 months

## 2018-03-17 NOTE — Progress Notes (Signed)
Left message for pt to return call.

## 2018-03-18 NOTE — Progress Notes (Signed)
Discussed with pt. Pt verbalized understanding. Number givent to pt for [facility] and he states he will call them.

## 2018-03-19 DIAGNOSIS — I10 Essential (primary) hypertension: Secondary | ICD-10-CM | POA: Diagnosis not present

## 2018-03-19 DIAGNOSIS — E7849 Other hyperlipidemia: Secondary | ICD-10-CM | POA: Diagnosis not present

## 2018-03-19 DIAGNOSIS — E119 Type 2 diabetes mellitus without complications: Secondary | ICD-10-CM | POA: Diagnosis not present

## 2018-03-20 ENCOUNTER — Encounter: Payer: Self-pay | Admitting: Family Medicine

## 2018-03-20 LAB — HEMOGLOBIN A1C
Est. average glucose Bld gHb Est-mCnc: 143 mg/dL
Hgb A1c MFr Bld: 6.6 % — ABNORMAL HIGH (ref 4.8–5.6)

## 2018-03-20 LAB — BASIC METABOLIC PANEL
BUN/Creatinine Ratio: 12 (ref 10–24)
BUN: 16 mg/dL (ref 8–27)
CO2: 21 mmol/L (ref 20–29)
Calcium: 9.7 mg/dL (ref 8.6–10.2)
Chloride: 108 mmol/L — ABNORMAL HIGH (ref 96–106)
Creatinine, Ser: 1.38 mg/dL — ABNORMAL HIGH (ref 0.76–1.27)
GFR calc Af Amer: 58 mL/min/{1.73_m2} — ABNORMAL LOW (ref >59)
GFR calc non Af Amer: 50 mL/min/{1.73_m2} — ABNORMAL LOW (ref >59)
Glucose: 106 mg/dL — ABNORMAL HIGH (ref 65–99)
Potassium: 4.7 mmol/L (ref 3.5–5.2)
Sodium: 145 mmol/L — ABNORMAL HIGH (ref 134–144)

## 2018-03-20 LAB — LIPID PANEL
Chol/HDL Ratio: 3.5 ratio (ref 0.0–5.0)
Cholesterol, Total: 115 mg/dL (ref 100–199)
HDL: 33 mg/dL — ABNORMAL LOW (ref >39)
LDL Calculated: 60 mg/dL (ref 0–99)
Triglycerides: 110 mg/dL (ref 0–149)
VLDL Cholesterol Cal: 22 mg/dL (ref 5–40)

## 2018-03-25 NOTE — Telephone Encounter (Signed)
Kerry Solis, can we try to call patient again regarding labs? I believe his voicemail should be set up now.

## 2018-03-31 ENCOUNTER — Other Ambulatory Visit: Payer: Self-pay

## 2018-03-31 DIAGNOSIS — Z8619 Personal history of other infectious and parasitic diseases: Secondary | ICD-10-CM

## 2018-04-01 NOTE — Progress Notes (Signed)
See other orders.

## 2018-04-01 NOTE — Telephone Encounter (Signed)
LATE ENTRY: I informed pt yesterday and he said he will go to the lab in the next day or so.

## 2018-04-01 NOTE — Telephone Encounter (Signed)
That's ok, as long as he gets labs done now.

## 2018-04-01 NOTE — Telephone Encounter (Signed)
Kerry Solis, pt's appt is in January, is that [facility]?

## 2018-04-02 DIAGNOSIS — Z8619 Personal history of other infectious and parasitic diseases: Secondary | ICD-10-CM | POA: Diagnosis not present

## 2018-04-02 NOTE — Telephone Encounter (Signed)
Noted  

## 2018-04-04 LAB — HEPATITIS B DNA, ULTRAQUANTITATIVE, PCR
Hepatitis B DNA (Calc): <1 Log IU/mL
Hepatitis B DNA: <10 IU/mL

## 2018-04-04 LAB — HEPATIC FUNCTION PANEL
AG Ratio: 1.4 (calc) (ref 1.0–2.5)
ALT: 45 U/L (ref 9–46)
AST: 47 U/L — ABNORMAL HIGH (ref 10–35)
Albumin: 4.7 g/dL (ref 3.6–5.1)
Alkaline phosphatase (APISO): 64 U/L (ref 40–115)
Bilirubin, Direct: 0.1 mg/dL (ref 0.0–0.2)
Globulin: 3.3 g/dL (calc) (ref 1.9–3.7)
Indirect Bilirubin: 0.2 mg/dL (calc) (ref 0.2–1.2)
Total Bilirubin: 0.3 mg/dL (ref 0.2–1.2)
Total Protein: 8 g/dL (ref 6.1–8.1)

## 2018-04-08 NOTE — Progress Notes (Signed)
Started Ocrevus June 27 and July 10th for MS. We need to check HBV DNA and HFP every 6 weeks. Hep B DNA undetectable, LFTs stable.

## 2018-04-09 ENCOUNTER — Other Ambulatory Visit: Payer: Self-pay

## 2018-04-09 DIAGNOSIS — Z8619 Personal history of other infectious and parasitic diseases: Secondary | ICD-10-CM

## 2018-04-09 NOTE — Progress Notes (Signed)
LMOM to call.

## 2018-04-11 NOTE — Progress Notes (Signed)
LMOM to call.  Lab orders on file for Nov. 29, 2019.

## 2018-04-11 NOTE — Progress Notes (Signed)
Mailing a letter to pt with the info.

## 2018-04-14 ENCOUNTER — Telehealth: Payer: Self-pay | Admitting: Gastroenterology

## 2018-04-14 NOTE — Telephone Encounter (Signed)
Spoke with pt. Letter was mailed by DS in reference to pt needing labwork.

## 2018-04-14 NOTE — Telephone Encounter (Signed)
Pt was returning a call from last week. Please call [phone]

## 2018-04-14 NOTE — Telephone Encounter (Signed)
Lmom, waiting on a return call.  

## 2018-05-08 ENCOUNTER — Other Ambulatory Visit: Payer: Self-pay

## 2018-05-14 DIAGNOSIS — Z8619 Personal history of other infectious and parasitic diseases: Secondary | ICD-10-CM | POA: Diagnosis not present

## 2018-05-16 ENCOUNTER — Other Ambulatory Visit: Payer: Self-pay | Admitting: Family Medicine

## 2018-05-16 LAB — HEPATITIS B DNA, ULTRAQUANTITATIVE, PCR
Hepatitis B DNA (Calc): <1 Log IU/mL
Hepatitis B DNA: <10 IU/mL

## 2018-05-16 LAB — HEPATIC FUNCTION PANEL
AG Ratio: 1.6 (calc) (ref 1.0–2.5)
ALT: 35 U/L (ref 9–46)
AST: 35 U/L (ref 10–35)
Albumin: 4.7 g/dL (ref 3.6–5.1)
Alkaline phosphatase (APISO): 72 U/L (ref 40–115)
Bilirubin, Direct: 0.1 mg/dL (ref 0.0–0.2)
Globulin: 3 g/dL (calc) (ref 1.9–3.7)
Indirect Bilirubin: 0.2 mg/dL (calc) (ref 0.2–1.2)
Total Bilirubin: 0.3 mg/dL (ref 0.2–1.2)
Total Protein: 7.7 g/dL (ref 6.1–8.1)

## 2018-05-19 DIAGNOSIS — G35 Multiple sclerosis: Secondary | ICD-10-CM | POA: Diagnosis not present

## 2018-05-19 DIAGNOSIS — B1910 Unspecified viral hepatitis B without hepatic coma: Secondary | ICD-10-CM | POA: Diagnosis not present

## 2018-05-19 DIAGNOSIS — B20 Human immunodeficiency virus [HIV] disease: Secondary | ICD-10-CM | POA: Diagnosis not present

## 2018-05-19 DIAGNOSIS — Z79899 Other long term (current) drug therapy: Secondary | ICD-10-CM | POA: Diagnosis not present

## 2018-05-21 ENCOUNTER — Other Ambulatory Visit: Payer: Self-pay

## 2018-05-21 DIAGNOSIS — Z8619 Personal history of other infectious and parasitic diseases: Secondary | ICD-10-CM

## 2018-05-21 NOTE — Progress Notes (Signed)
Pt is aware and lab orders on file for 07/02/2018.

## 2018-05-21 NOTE — Progress Notes (Signed)
Labs normal. Need to recheck Hep B DNA and HFP in 6 weeks.

## 2018-05-21 NOTE — Progress Notes (Signed)
LMOM to call.

## 2018-05-23 DIAGNOSIS — R350 Frequency of micturition: Secondary | ICD-10-CM | POA: Diagnosis not present

## 2018-05-23 DIAGNOSIS — R3912 Poor urinary stream: Secondary | ICD-10-CM | POA: Diagnosis not present

## 2018-05-23 DIAGNOSIS — C61 Malignant neoplasm of prostate: Secondary | ICD-10-CM | POA: Diagnosis not present

## 2018-06-06 ENCOUNTER — Telehealth: Payer: Self-pay | Admitting: Emergency Medicine

## 2018-06-06 NOTE — Telephone Encounter (Signed)
Called patient to notify him that [employer] called to inquire about some proscriptions that were filled a few months back by Doctor jenkins. No response left vm

## 2018-06-06 NOTE — Telephone Encounter (Signed)
Desiree called from [employer] and asked for voided  rx that doctor jenkins wrote for patient a few months ago so  They could reimburse him his out of pocket cost. I notified her that I am unable to do this due to [employer] policies but the patient can come by our office on Monday that we are open 8:30-4p, she stated she understood and she would notify the patient or that he could call our office.

## 2018-06-12 DIAGNOSIS — G35 Multiple sclerosis: Secondary | ICD-10-CM | POA: Diagnosis not present

## 2018-06-13 ENCOUNTER — Other Ambulatory Visit: Payer: Self-pay | Admitting: Family Medicine

## 2018-06-24 NOTE — Progress Notes (Signed)
Referring Provider: Kathyrn Drown, MD Primary Care Physician:  Kathyrn Drown, MD Primary GI: Dr. Oneida Alar   Chief Complaint  Patient presents with  . fatty liver    f/u. Doing okay    HPI:   Kerry Solis is a 73 y.o. male presenting today with a history of elevated transaminases in the past, likely due to fatty liver. History of Hep B exposure in thte past, now on Ocrevus starting in June for MS. Communication with Dr. Ala Bent, neurologist, regarding this. We have been following HBV DNA and HFP serially. Colonoscopy due in 2021. Most recent HFP normal in Nov 2019. Due for labs now. Also notable history of dysphagia s/p EGD Feb 2018 with dilation of Schatzki's ring.   Some choking with eating at times. Teeth have been pulled, and he notes he has been swallowing large bites. No abdominal pain. Feels better since starting Ocrevus. Followed closely by Neurology. Good appetite. Some weight gain noted. No rectal bleeding.     Past Medical History:  Diagnosis Date  . Anxiety   . Arthritis   . 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 Depression   . Elevated liver enzymes   . Glaucoma   . High triglycerides   . History of gout   . HIV (human immunodeficiency virus infection) ([place])   . Hypertension   . Leukopenia    Low CD4  . Multiple sclerosis ([place])    uses a cane  . Myocardial infarction ([place])    45 years ago  . Prostate cancer [facility])     Past Surgical History:  Procedure Laterality Date  . BIOPSY  08/14/2016   Procedure: BIOPSY;  Surgeon: Danie Binder, MD;  [facility];  Service: Endoscopy;;  gastric bx's  . CHOLECYSTECTOMY  06/10/2011   Procedure: LAPAROSCOPIC CHOLECYSTECTOMY;  Surgeon: Donato Heinz, MD;  Location: [facility] OR[facility];  Service: General;  Laterality: N/A;  . COLONOSCOPY  03/10/2009   [facility]:QIONGEXB internal hemorrhoids/6-mm sessile ascending colon polyp/tortuous colon, diverticulosis. TA  . COLONOSCOPY WITH PROPOFOL  N/A 04/19/2015   Dr. Oneida Alar: sessile serrated adenoma, surveillance in 5 years   . ESOPHAGOGASTRODUODENOSCOPY  06/23/2008   [facility]:[id number] gastritis/schatzki ring  . ESOPHAGOGASTRODUODENOSCOPY (EGD) WITH PROPOFOL N/A 08/14/2016   Dr. Oneida Alar: moderate Schatzi's ring s/p dilation, LA Grade A esophagitis,small hiatal hernia, chronic gastritis (negative H.pylori), one duodenal diverticulum  . LAPAROSCOPIC APPENDECTOMY N/A 12/13/2017   Procedure: APPENDECTOMY LAPAROSCOPIC;  Surgeon: Aviva Signs, MD;  Location: [facility];  Service: General;  Laterality: N/A;  . NECK SURGERY  unsure   disc  . POLYPECTOMY N/A 04/19/2015   Procedure: POLYPECTOMY;  Surgeon: Danie Binder, MD;  Location: [facility];  Service: Endoscopy;  Laterality: N/A;  ascending colon  . PROSTATE BIOPSY    . RADIOACTIVE SEED IMPLANT N/A 11/29/2016   Procedure: RADIOACTIVE SEED IMPLANT/BRACHYTHERAPY IMPLANT;  Surgeon: Franchot Gallo, MD;  Location: [facility];  Service: Urology;  Laterality: N/A;  81 seeds implanted  . SAVORY DILATION N/A 08/14/2016   Procedure: SAVORY DILATION;  Surgeon: Danie Binder, MD;  [facility];  Service: Endoscopy;  Laterality: N/A;    Current Outpatient Medications  Medication Sig Dispense Refill  . acetaminophen (TYLENOL) 500 MG tablet Take 1,000 mg by mouth 3 (three) times daily as needed for mild pain or moderate pain.    Marland Kitchen allopurinol (ZYLOPRIM) 100 MG tablet TAKE (1) TABLET BY MOUTH ONCE  DAILY FOR GOUT. 30 tablet 5  . amitriptyline (ELAVIL) 25 MG tablet TAKE TWO TABLETS BY MOUTH DAILY AT BEDTIME  9  . amLODipine (NORVASC) 5 MG tablet Take 1 tablet (5 mg total) by mouth daily. 1 qd 30 tablet 0  . aspirin EC 81 MG tablet Take 81 mg by mouth daily.    . carbamazepine (TEGRETOL) 100 MG chewable tablet Chew daily by mouth.     . chlorzoxazone (PARAFON) 500 MG tablet Take by mouth 4 (four) times daily as needed for muscle spasms.    . dolutegravir (TIVICAY) 50 MG tablet TAKE 1 TABLET  BY MOUTH EVERY DAY. STOP ATRIPLA    . folic acid (FOLVITE) 1 MG tablet Take 1 mg by mouth daily.    Marland Kitchen latanoprost (XALATAN) 0.005 % ophthalmic solution Place 1 drop at bedtime into both eyes.     Marland Kitchen lisinopril-hydrochlorothiazide (PRINZIDE,ZESTORETIC) 10-12.5 MG tablet Take 1 tablet by mouth daily. 1 qd 30 tablet 0  . oxyCODONE (ROXICODONE) 5 MG immediate release tablet Take 1 tablet (5 mg total) by mouth every 6 (six) hours as needed. 25 tablet 0  . polyethylene glycol powder (GLYCOLAX/MIRALAX) powder Take 17 g by mouth daily. (Patient taking differently: Take 17 g by mouth as needed. ) 3350 g 5  . pravastatin (PRAVACHOL) 20 MG tablet TAKE 1 TABLET BY MOUTH ONCE DAILY. 30 tablet 0  . sertraline (ZOLOFT) 100 MG tablet TAKE 1 TABLET BY MOUTH ONCE DAILY. 30 tablet 9  . traMADol (ULTRAM) 50 MG tablet Take 1 tablet (50 mg total) by mouth every 6 (six) hours as needed. 15 tablet 0   No current facility-administered medications for this visit.     Allergies as of 06/25/2018 - Review Complete 06/25/2018  Allergen Reaction Noted  . Yellow jacket venom Swelling 03/14/2016    Family History  Problem Relation Age of Onset  . Hypertension Mother   . Heart attack Mother   . Heart attack Father   . Cancer Sister        Breast  . Diabetes Sister   . Diabetes Brother   . Cancer Brother        unknown  . Cancer Brother        colon  . Cancer Sister        breast  . Cancer Brother        prostate  . Cancer Brother        colon    Social History   Socioeconomic History  . Marital status: Divorced    Spouse name: Not on file  . Number of children: Not on file  . Years of education: Not on file  . Highest education level: Not on file  Occupational History  . Not on file  Social Needs  . Financial resource strain: Not on file  . Food insecurity:    Worry: Not on file    Inability: Not on file  . Transportation needs:    Medical: Not on file    Non-medical: Not on file  Tobacco Use    . Smoking status: Never Smoker  . Smokeless tobacco: Never Used  Substance and Sexual Activity  . Alcohol use: No  . Drug use: No  . Sexual activity: Never    Birth control/protection: Abstinence  Lifestyle  . Physical activity:    Days per week: Not on file    Minutes per session: Not on file  . Stress: Not on file  Relationships  . Social connections:  Talks on phone: Not on file    Gets together: Not on file    Attends religious service: Not on file    Active member of club or organization: Not on file    Attends meetings of clubs or organizations: Not on file    Relationship status: Not on file  Other Topics Concern  . Not on file  Social History Narrative  . Not on file    Review of Systems: Gen: Denies fever, chills, anorexia. Denies fatigue, weakness, weight loss.  CV: Denies chest pain, palpitations, syncope, peripheral edema, and claudication. Resp: Denies dyspnea at rest, cough, wheezing, coughing up blood, and pleurisy. GI: see HPI  Derm: Denies rash, itching, dry skin Psych: Denies depression, anxiety, memory loss, confusion. No homicidal or suicidal ideation.  Heme: Denies bruising, bleeding, and enlarged lymph nodes.  Physical Exam: BP 139/82   Pulse 86   Temp 97.9 F (36.6 C) (Oral)   Ht 5\' 8"  (1.727 m)   Wt 219 lb (99.3 kg)   BMI 33.30 kg/m  General:   Alert and oriented. No distress noted. Pleasant and cooperative.  Head:  Normocephalic and atraumatic. Eyes:  Conjuctiva clear without scleral icterus. Mouth:  Oral mucosa pink and moist.  Abdomen:  +BS, soft, non-tender and non-distended. No rebound or guarding. No HSM or masses noted. Msk:  Symmetrical without gross deformities. Normal posture. Extremities:  Without edema. Neurologic:  Alert and  oriented x4 Psych:  Alert and cooperative. Normal mood and affect.

## 2018-06-25 ENCOUNTER — Encounter: Payer: Self-pay | Admitting: Gastroenterology

## 2018-06-25 ENCOUNTER — Ambulatory Visit (INDEPENDENT_AMBULATORY_CARE_PROVIDER_SITE_OTHER): Payer: Medicare Other | Admitting: Gastroenterology

## 2018-06-25 VITALS — BP 139/82 | HR 86 | Temp 97.9°F | Ht 68.0 in | Wt 219.0 lb

## 2018-06-25 DIAGNOSIS — K760 Fatty (change of) liver, not elsewhere classified: Secondary | ICD-10-CM | POA: Diagnosis not present

## 2018-06-25 DIAGNOSIS — Z86010 Personal history of colonic polyps: Secondary | ICD-10-CM

## 2018-06-25 NOTE — Progress Notes (Signed)
cc'd to pcp 

## 2018-06-25 NOTE — Assessment & Plan Note (Signed)
Due in 2021.

## 2018-06-25 NOTE — Assessment & Plan Note (Signed)
73 year old male with history of fatty liver, elevated transaminases in the past but now normalized. Some weight gain noted over the holidays, and we discussed dietary changes. Due to history of exposure to Hep B and now on Ocrevus, Neurology has asked that Hep B DNA and HFP be followed serially. Will check now and then every 3 months thereafter. Return in 6 months or sooner if needed.

## 2018-06-25 NOTE — Patient Instructions (Signed)
Please have blood work done today.   Your next colonoscopy is due in 2021.  We will both work on good diet choices and purposeful weight loss for 2020!  I will see you back in 6 months!  Let us know if you have any issues or concerns before that time.  I enjoyed seeing you again today! As you know, I value our relationship and want to provide genuine, compassionate, and quality care. I welcome your feedback. If you receive a survey regarding your visit,  I greatly appreciate you taking time to fill this out. See you next time!  Annitta Needs, PhD, ANP-BC [facility]

## 2018-06-27 LAB — HEPATIC FUNCTION PANEL
AG Ratio: 1.4 (calc) (ref 1.0–2.5)
ALT: 39 U/L (ref 9–46)
AST: 35 U/L (ref 10–35)
Albumin: 4.6 g/dL (ref 3.6–5.1)
Alkaline phosphatase (APISO): 63 U/L (ref 40–115)
Bilirubin, Direct: 0.1 mg/dL (ref 0.0–0.2)
Globulin: 3.2 g/dL (calc) (ref 1.9–3.7)
Indirect Bilirubin: 0.3 mg/dL (calc) (ref 0.2–1.2)
Total Bilirubin: 0.4 mg/dL (ref 0.2–1.2)
Total Protein: 7.8 g/dL (ref 6.1–8.1)

## 2018-06-27 LAB — HEPATITIS B DNA, ULTRAQUANTITATIVE, PCR
Hepatitis B DNA (Calc): <1 Log IU/mL
Hepatitis B DNA: <10 IU/mL

## 2018-07-03 NOTE — Progress Notes (Signed)
LFTs remain normal, Hep B DNA remains undetected. Please repeat in 3 months

## 2018-07-04 ENCOUNTER — Encounter: Payer: Self-pay | Admitting: *Deleted

## 2018-07-04 NOTE — Progress Notes (Signed)
Pt is aware of results and plan.

## 2018-07-04 NOTE — Progress Notes (Signed)
[employer]) called to inquire about medication related expenses to help patient submit to Medicaid.   Printed detailed description and sent via email.

## 2018-07-09 ENCOUNTER — Other Ambulatory Visit: Payer: Self-pay

## 2018-07-09 DIAGNOSIS — R740 Nonspecific elevation of levels of transaminase and lactic acid dehydrogenase [LDH]: Principal | ICD-10-CM

## 2018-07-09 DIAGNOSIS — K760 Fatty (change of) liver, not elsewhere classified: Secondary | ICD-10-CM

## 2018-07-09 DIAGNOSIS — R7401 Elevation of levels of liver transaminase levels: Secondary | ICD-10-CM

## 2018-07-09 NOTE — Progress Notes (Signed)
Lab orders on file for 10/08/2018.

## 2018-07-16 ENCOUNTER — Ambulatory Visit (INDEPENDENT_AMBULATORY_CARE_PROVIDER_SITE_OTHER): Payer: Medicare Other | Admitting: Family Medicine

## 2018-07-16 ENCOUNTER — Encounter: Payer: Self-pay | Admitting: Family Medicine

## 2018-07-16 VITALS — BP 124/72 | Ht 68.0 in | Wt 219.0 lb

## 2018-07-16 DIAGNOSIS — E119 Type 2 diabetes mellitus without complications: Secondary | ICD-10-CM | POA: Diagnosis not present

## 2018-07-16 DIAGNOSIS — E785 Hyperlipidemia, unspecified: Secondary | ICD-10-CM | POA: Diagnosis not present

## 2018-07-16 DIAGNOSIS — N289 Disorder of kidney and ureter, unspecified: Secondary | ICD-10-CM | POA: Diagnosis not present

## 2018-07-16 LAB — POCT GLYCOSYLATED HEMOGLOBIN (HGB A1C): Hemoglobin A1C: 6.2 % — AB (ref 4.0–5.6)

## 2018-07-16 MED ORDER — AMLODIPINE BESYLATE 5 MG PO TABS: 5.0000 mg | ORAL_TABLET | Freq: Every day | ORAL | 5 refills | Status: DC

## 2018-07-16 MED ORDER — LISINOPRIL-HYDROCHLOROTHIAZIDE 10-12.5 MG PO TABS: 1.0000 | ORAL_TABLET | Freq: Every day | ORAL | 5 refills | Status: DC

## 2018-07-16 MED ORDER — OXYCODONE HCL 5 MG PO TABS: 5.0000 mg | ORAL_TABLET | ORAL | 0 refills | Status: DC | PRN

## 2018-07-16 MED ORDER — PRAVASTATIN SODIUM 20 MG PO TABS: 20.0000 mg | ORAL_TABLET | Freq: Every day | ORAL | 5 refills | Status: DC

## 2018-07-16 MED ORDER — SERTRALINE HCL 100 MG PO TABS: 100.0000 mg | ORAL_TABLET | Freq: Every day | ORAL | 9 refills | Status: DC

## 2018-07-16 NOTE — Patient Instructions (Addendum)
Diabetes Mellitus and Nutrition, Adult  When you have diabetes (diabetes mellitus), it is very important to have healthy eating habits because your blood sugar (glucose) levels are greatly affected by what you eat and drink. Eating healthy foods in the appropriate amounts, at about the same times every day, can help you:  · Control your blood glucose.  · Lower your risk of heart disease.  · Improve your blood pressure.  · Reach or maintain a healthy weight.  Every person with diabetes is different, and each person has different needs for a meal plan. Your health care provider may recommend that you work with a diet and nutrition specialist (dietitian) to make a meal plan that is best for you. Your meal plan may vary depending on factors such as:  · The calories you need.  · The medicines you take.  · Your weight.  · Your blood glucose, blood pressure, and cholesterol levels.  · Your activity level.  · Other health conditions you have, such as heart or kidney disease.  How do carbohydrates affect me?  Carbohydrates, also called carbs, affect your blood glucose level more than any other type of food. Eating carbs naturally raises the amount of glucose in your blood. Carb counting is a method for keeping track of how many carbs you eat. Counting carbs is important to keep your blood glucose at a healthy level, especially if you use insulin or take certain oral diabetes medicines.  It is important to know how many carbs you can safely have in each meal. This is different for every person. Your dietitian can help you calculate how many carbs you should have at each meal and for each snack.  Foods that contain carbs include:  · Bread, cereal, rice, pasta, and crackers.  · Potatoes and corn.  · Peas, beans, and lentils.  · Milk and yogurt.  · Fruit and juice.  · Desserts, such as cakes, cookies, ice cream, and candy.  How does alcohol affect me?  Alcohol can cause a sudden decrease in blood glucose (hypoglycemia),  especially if you use insulin or take certain oral diabetes medicines. Hypoglycemia can be a life-threatening condition. Symptoms of hypoglycemia (sleepiness, dizziness, and confusion) are similar to symptoms of having too much alcohol.  If your health care provider says that alcohol is safe for you, follow these guidelines:  · Limit alcohol intake to no more than 1 drink per day for nonpregnant women and 2 drinks per day for men. One drink equals 12 oz of beer, 5 oz of wine, or 1½ oz of hard liquor.  · Do not drink on an empty stomach.  · Keep yourself hydrated with water, diet soda, or unsweetened iced tea.  · Keep in mind that regular soda, juice, and other mixers may contain a lot of sugar and must be counted as carbs.  What are tips for following this plan?    Reading food labels  · Start by checking the serving size on the "Nutrition Facts" label of packaged foods and drinks. The amount of calories, carbs, fats, and other nutrients listed on the label is based on one serving of the item. Many items contain more than one serving per package.  · Check the total grams (g) of carbs in one serving. You can calculate the number of servings of carbs in one serving by dividing the total carbs by 15. For example, if a food has 30 g of total carbs, it would be equal to 2   servings of carbs.  · Check the number of grams (g) of saturated and trans fats in one serving. Choose foods that have low or no amount of these fats.  · Check the number of milligrams (mg) of salt (sodium) in one serving. Most people should limit total sodium intake to less than 2,300 mg per day.  · Always check the nutrition information of foods labeled as "low-fat" or "nonfat". These foods may be higher in added sugar or refined carbs and should be avoided.  · Talk to your dietitian to identify your daily goals for nutrients listed on the label.  Shopping  · Avoid buying canned, premade, or processed foods. These foods tend to be high in fat, sodium,  and added sugar.  · Shop around the outside edge of the grocery store. This includes fresh fruits and vegetables, bulk grains, fresh meats, and fresh dairy.  Cooking  · Use low-heat cooking methods, such as baking, instead of high-heat cooking methods like deep frying.  · Cook using healthy oils, such as olive, canola, or sunflower oil.  · Avoid cooking with butter, cream, or high-fat meats.  Meal planning  · Eat meals and snacks regularly, preferably at the same times every day. Avoid going long periods of time without eating.  · Eat foods high in fiber, such as fresh fruits, vegetables, beans, and whole grains. Talk to your dietitian about how many servings of carbs you can eat at each meal.  · Eat 4-6 ounces (oz) of lean protein each day, such as lean meat, chicken, fish, eggs, or tofu. One oz of lean protein is equal to:  ? 1 oz of meat, chicken, or fish.  ? 1 egg.  ? ¼ cup of tofu.  · Eat some foods each day that contain healthy fats, such as avocado, nuts, seeds, and fish.  Lifestyle  · Check your blood glucose regularly.  · Exercise regularly as told by your health care provider. This may include:  ? 150 minutes of moderate-intensity or vigorous-intensity exercise each week. This could be brisk walking, biking, or water aerobics.  ? Stretching and doing strength exercises, such as yoga or weightlifting, at least 2 times a week.  · Take medicines as told by your health care provider.  · Do not use any products that contain nicotine or tobacco, such as cigarettes and e-cigarettes. If you need help quitting, ask your health care provider.  · Work with a counselor or diabetes educator to identify strategies to manage stress and any emotional and social challenges.  Questions to ask a health care provider  · Do I need to meet with a diabetes educator?  · Do I need to meet with a dietitian?  · What number can I call if I have questions?  · When are the best times to check my blood glucose?  Where to find more  information:  · American Diabetes Association: diabetes.org  · Academy of Nutrition and Dietetics: www.eatright.org  · National Institute of Diabetes and Digestive and Kidney Diseases (NIH): www.niddk.nih.gov  Summary  · A healthy meal plan will help you control your blood glucose and maintain a healthy lifestyle.  · Working with a diet and nutrition specialist (dietitian) can help you make a meal plan that is best for you.  · Keep in mind that carbohydrates (carbs) and alcohol have immediate effects on your blood glucose levels. It is important to count carbs and to use alcohol carefully.  This information is not intended to   replace advice given to you by your health care provider. Make sure you discuss any questions you have with your health care provider.  Document Released: 03/01/2005 Document Revised: 01/02/2017 Document Reviewed: 07/09/2016  Elsevier Interactive Patient Education © 2019 Elsevier Inc.

## 2018-07-16 NOTE — Progress Notes (Signed)
Subjective:    Patient ID: Kerry Solis, male    DOB: June 04, 1946, 73 y.o.   MRN: [id number]  Diabetes  He presents for his follow-up diabetic visit. He has type 2 diabetes mellitus. Pertinent negatives for hypoglycemia include no confusion, dizziness or headaches. Pertinent negatives for diabetes include no chest pain and no fatigue. Current diabetic treatment includes diet.  pt states he did not know he was diabetic and he does not have a glucose monitor.   Having weakness and muscle aches.   Results for orders placed or performed in visit on 07/16/18  POCT glycosylated hemoglobin (Hb A1C)  Result Value Ref Range   Hemoglobin A1C 6.2 (A) 4.0 - 5.6 %   HbA1c POC (<> result, manual entry)     HbA1c, POC (prediabetic range)     HbA1c, POC (controlled diabetic range)     Patient here for follow-up regarding cholesterol.  The patient does have hyperlipidemia.  Patient does try to maintain a reasonable diet.  Patient does take the medication on a regular basis.  Denies missing a dose.  The patient denies any obvious side effects.  Prior blood work results reviewed with the patient.  The patient is aware of his cholesterol goals and the need to keep it under good control to lessen the risk of disease.  Patient for blood pressure check up.  The patient does have hypertension.  The patient is on medication.  Patient relates compliance with meds. Todays BP reviewed with the patient. Patient denies issues with medication. Patient relates reasonable diet. Patient tries to minimize salt. Patient aware of BP goals.  Patient does have MS Also has HIV Followed by specialist doing well with this   Review of Systems  Constitutional: Negative for diaphoresis and fatigue.  HENT: Negative for congestion and rhinorrhea.   Respiratory: Negative for cough and shortness of breath.   Cardiovascular: Negative for chest pain and leg swelling.  Gastrointestinal: Negative for abdominal pain and diarrhea.    Skin: Negative for color change and rash.  Neurological: Negative for dizziness and headaches.  Psychiatric/Behavioral: Negative for behavioral problems and confusion.       Objective:   Physical Exam Vitals signs reviewed.  Constitutional:      General: He is not in acute distress. HENT:     Head: Normocephalic and atraumatic.  Eyes:     General:        Right eye: No discharge.        Left eye: No discharge.  Neck:     Trachea: No tracheal deviation.  Cardiovascular:     Rate and Rhythm: Normal rate and regular rhythm.     Heart sounds: Normal heart sounds. No murmur.  Pulmonary:     Effort: Pulmonary effort is normal. No respiratory distress.     Breath sounds: Normal breath sounds.  Lymphadenopathy:     Cervical: No cervical adenopathy.  Skin:    General: Skin is warm and dry.  Neurological:     Mental Status: He is alert.     Coordination: Coordination normal.  Psychiatric:        Behavior: Behavior normal.    15 minutes was spent with patient today discussing healthcare issues which they came.  More than 50% of this visit-total duration of visit-was spent in counseling and coordination of care.  Please see diagnosis regarding the focus of this coordination and care      Assessment & Plan:  Does get intermittent low back pain  discomfort oxycodone prescribed short amount use intermittently caution drowsiness home use only  The patient was seen today as part of an evaluation regarding hyperlipidemia.  Recent lab work has been reviewed with the patient as well as the goals for good cholesterol care.  In addition to this medications have been discussed the importance of compliance with diet and medications discussed as well.  Finally the patient is aware that poor control of cholesterol, noncompliance can dramatically increase the risk of complications. The patient will keep regular office visits and the patient does agreed to periodic lab work.  HTN- Patient was  seen today as part of a visit regarding hypertension. The importance of healthy diet and regular physical activity was discussed. The importance of compliance with medications discussed.  Ideal goal is to keep blood pressure low elevated levels certainly below 374/82 when possible.  The patient was counseled that keeping blood pressure under control lessen his risk of complications.  The importance of regular follow-ups was discussed with the patient.  Low-salt diet such as DASH recommended.  Regular physical activity was recommended as well.  Patient was advised to keep regular follow-ups.  The patient was seen today as part of a comprehensive visit for diabetes. The importance of keeping her A1c at or below 7 was discussed.  Importance of regular physical activity was discussed.   The importance of adherence to medication as well as a controlled low starch/sugar diet was also discussed.  Standard follow-up visit recommended.  Also patient aware failure to keep diabetes under control increases the risk of complications.  At this point we are treating his diabetes with dietary measures and activity may need to add medicine if he does not get things better

## 2018-07-23 DIAGNOSIS — F418 Other specified anxiety disorders: Secondary | ICD-10-CM | POA: Diagnosis not present

## 2018-07-23 DIAGNOSIS — Z21 Asymptomatic human immunodeficiency virus [HIV] infection status: Secondary | ICD-10-CM | POA: Diagnosis not present

## 2018-07-23 DIAGNOSIS — G35 Multiple sclerosis: Secondary | ICD-10-CM | POA: Diagnosis not present

## 2018-07-23 DIAGNOSIS — Z792 Long term (current) use of antibiotics: Secondary | ICD-10-CM | POA: Diagnosis not present

## 2018-07-23 DIAGNOSIS — K0889 Other specified disorders of teeth and supporting structures: Secondary | ICD-10-CM | POA: Diagnosis not present

## 2018-07-23 DIAGNOSIS — I10 Essential (primary) hypertension: Secondary | ICD-10-CM | POA: Diagnosis not present

## 2018-07-23 DIAGNOSIS — C61 Malignant neoplasm of prostate: Secondary | ICD-10-CM | POA: Diagnosis not present

## 2018-07-23 DIAGNOSIS — B20 Human immunodeficiency virus [HIV] disease: Secondary | ICD-10-CM | POA: Diagnosis not present

## 2018-08-15 ENCOUNTER — Other Ambulatory Visit: Payer: Self-pay | Admitting: Nurse Practitioner

## 2018-09-02 ENCOUNTER — Other Ambulatory Visit: Payer: Self-pay

## 2018-09-02 DIAGNOSIS — R740 Nonspecific elevation of levels of transaminase and lactic acid dehydrogenase [LDH]: Principal | ICD-10-CM

## 2018-09-02 DIAGNOSIS — K760 Fatty (change of) liver, not elsewhere classified: Secondary | ICD-10-CM

## 2018-09-02 DIAGNOSIS — R7401 Elevation of levels of liver transaminase levels: Secondary | ICD-10-CM

## 2018-10-07 ENCOUNTER — Telehealth: Payer: Self-pay | Admitting: Family Medicine

## 2018-10-07 NOTE — Telephone Encounter (Signed)
Patient called back about refill on pain meds and I schedule him for a phone visit tomorrow afternoon.

## 2018-10-07 NOTE — Telephone Encounter (Signed)
Pt would like to have a refill on oxyCODONE (ROXICODONE) 5 MG immediate release tablet.   Please send to [place] [place]

## 2018-10-08 ENCOUNTER — Ambulatory Visit (INDEPENDENT_AMBULATORY_CARE_PROVIDER_SITE_OTHER): Payer: Medicare Other | Admitting: Family Medicine

## 2018-10-08 ENCOUNTER — Other Ambulatory Visit: Payer: Self-pay

## 2018-10-08 DIAGNOSIS — M545 Low back pain, unspecified: Secondary | ICD-10-CM

## 2018-10-08 MED ORDER — OXYCODONE HCL 5 MG PO TABS: 5.0000 mg | ORAL_TABLET | ORAL | 0 refills | Status: DC | PRN

## 2018-10-08 NOTE — Progress Notes (Signed)
   Subjective:    Patient ID: Kerry Solis, male    DOB: Jul 24, 1945, 73 y.o.   MRN: [id number] Telephone visit unable to do video patient at home I was present at the office coronavirus outbreak Back Pain  This is a new problem. Episode onset: 3 weeks.  pain in back and muscle pain in both legs. Tried oxycodone, ibuprofen, tylenol. No relief.  Patient relates a lot of back pain low back pain radiates into both legs worse at nighttime when he lays down denies any numbness tingling or weakness does have a history of lumbar troubles as well as MS denies fever chills sweats weight loss.  Does use oxycodone intermittently is been several months since he got it he needs any refills requesting a new refill Virtual Visit via Telephone Note  I connected with Kerry Solis on 10/08/18 at  2:00 PM EDT by telephone and verified that I am speaking with the correct person using two identifiers.   I discussed the limitations, risks, security and privacy concerns of performing an evaluation and management service by telephone and the availability of in person appointments. I also discussed with the patient that there may be a patient responsible charge related to this service. The patient expressed understanding and agreed to proceed.   History of Present Illness:    Observations/Objective:   Assessment and Plan:   Follow Up Instructions:    I discussed the assessment and treatment plan with the patient. The patient was provided an opportunity to ask questions and all were answered. The patient agreed with the plan and demonstrated an understanding of the instructions.   The patient was advised to call back or seek an in-person evaluation if the symptoms worsen or if the condition fails to improve as anticipated.  I provided 15  minutes of non-face-to-face time during this encounter.       Objective:   Physical Exam        Assessment & Plan:  Low back pain with leg pain worse at  nighttime is more than likely that this is related into the possibility of spinal stenosis he will keep a log regarding how he is doing he will give [place] feedback on how things are going.  We will follow-up with the patient in 6 to 8 weeks time pain medicine was sent him caution drowsiness uses it infrequently

## 2018-10-08 NOTE — Telephone Encounter (Signed)
I will discuss with patient on the 22nd

## 2018-10-31 DIAGNOSIS — H40001 Preglaucoma, unspecified, right eye: Secondary | ICD-10-CM | POA: Diagnosis not present

## 2018-10-31 DIAGNOSIS — H401124 Primary open-angle glaucoma, left eye, indeterminate stage: Secondary | ICD-10-CM | POA: Diagnosis not present

## 2018-10-31 DIAGNOSIS — H2513 Age-related nuclear cataract, bilateral: Secondary | ICD-10-CM | POA: Diagnosis not present

## 2018-11-07 ENCOUNTER — Other Ambulatory Visit: Payer: Self-pay | Admitting: Family Medicine

## 2018-11-12 DIAGNOSIS — E119 Type 2 diabetes mellitus without complications: Secondary | ICD-10-CM | POA: Diagnosis not present

## 2018-11-12 DIAGNOSIS — E785 Hyperlipidemia, unspecified: Secondary | ICD-10-CM | POA: Diagnosis not present

## 2018-11-13 ENCOUNTER — Encounter: Payer: Self-pay | Admitting: Family Medicine

## 2018-11-13 LAB — BASIC METABOLIC PANEL
BUN/Creatinine Ratio: 11 (ref 10–24)
BUN: 18 mg/dL (ref 8–27)
CO2: 21 mmol/L (ref 20–29)
Calcium: 10.1 mg/dL (ref 8.6–10.2)
Chloride: 103 mmol/L (ref 96–106)
Creatinine, Ser: 1.6 mg/dL — ABNORMAL HIGH (ref 0.76–1.27)
GFR calc Af Amer: 48 mL/min/{1.73_m2} — ABNORMAL LOW (ref >59)
GFR calc non Af Amer: 42 mL/min/{1.73_m2} — ABNORMAL LOW (ref >59)
Glucose: 105 mg/dL — ABNORMAL HIGH (ref 65–99)
Potassium: 4.6 mmol/L (ref 3.5–5.2)
Sodium: 142 mmol/L (ref 134–144)

## 2018-11-13 LAB — LIPID PANEL
Chol/HDL Ratio: 3.8 ratio (ref 0.0–5.0)
Cholesterol, Total: 139 mg/dL (ref 100–199)
HDL: 37 mg/dL — ABNORMAL LOW (ref >39)
LDL Calculated: 82 mg/dL (ref 0–99)
Triglycerides: 101 mg/dL (ref 0–149)
VLDL Cholesterol Cal: 20 mg/dL (ref 5–40)

## 2018-11-13 LAB — HEMOGLOBIN A1C
Est. average glucose Bld gHb Est-mCnc: 160 mg/dL
Hgb A1c MFr Bld: 7.2 % — ABNORMAL HIGH (ref 4.8–5.6)

## 2018-11-13 NOTE — Addendum Note (Signed)
Addended by: Carmelina Noun on: 11/13/2018 01:40 PM   Modules accepted: Orders

## 2018-11-17 ENCOUNTER — Ambulatory Visit: Payer: Medicare Other | Admitting: Family Medicine

## 2018-11-19 DIAGNOSIS — Z8546 Personal history of malignant neoplasm of prostate: Secondary | ICD-10-CM | POA: Diagnosis not present

## 2018-11-19 DIAGNOSIS — R350 Frequency of micturition: Secondary | ICD-10-CM | POA: Diagnosis not present

## 2018-11-19 DIAGNOSIS — N401 Enlarged prostate with lower urinary tract symptoms: Secondary | ICD-10-CM | POA: Diagnosis not present

## 2018-11-26 DIAGNOSIS — G35 Multiple sclerosis: Secondary | ICD-10-CM | POA: Diagnosis not present

## 2018-12-03 ENCOUNTER — Encounter: Payer: Self-pay | Admitting: Family Medicine

## 2018-12-03 ENCOUNTER — Other Ambulatory Visit: Payer: Self-pay | Admitting: Family Medicine

## 2018-12-03 ENCOUNTER — Other Ambulatory Visit: Payer: Self-pay

## 2018-12-03 ENCOUNTER — Ambulatory Visit (INDEPENDENT_AMBULATORY_CARE_PROVIDER_SITE_OTHER): Payer: Medicare Other | Admitting: Family Medicine

## 2018-12-03 VITALS — BP 130/86 | Temp 98.4°F | Wt 215.6 lb

## 2018-12-03 DIAGNOSIS — N289 Disorder of kidney and ureter, unspecified: Secondary | ICD-10-CM | POA: Diagnosis not present

## 2018-12-03 DIAGNOSIS — E1169 Type 2 diabetes mellitus with other specified complication: Secondary | ICD-10-CM

## 2018-12-03 DIAGNOSIS — I10 Essential (primary) hypertension: Secondary | ICD-10-CM

## 2018-12-03 MED ORDER — PANTOPRAZOLE SODIUM 40 MG PO TBEC: 40.0000 mg | DELAYED_RELEASE_TABLET | Freq: Every day | ORAL | 6 refills | Status: DC

## 2018-12-03 MED ORDER — SERTRALINE HCL 100 MG PO TABS: 100.0000 mg | ORAL_TABLET | Freq: Every day | ORAL | 6 refills | Status: DC

## 2018-12-03 MED ORDER — LISINOPRIL 10 MG PO TABS: 10.0000 mg | ORAL_TABLET | Freq: Every day | ORAL | 5 refills | Status: DC

## 2018-12-03 MED ORDER — SHINGRIX 50 MCG/0.5ML IM SUSR: 0.5000 mL | Freq: Once | INTRAMUSCULAR | 1 refills | Status: AC

## 2018-12-03 NOTE — Progress Notes (Signed)
Subjective:    Patient ID: Kerry Solis, male    DOB: [birth year], 73 y.o.   MRN: [id number]  Hypertension This is a chronic problem. Pertinent negatives include no chest pain, headaches or shortness of breath. Risk factors for coronary artery disease include diabetes mellitus. There are no compliance problems.   Diabetes He presents for his follow-up diabetic visit. He has type 2 diabetes mellitus. There are no hypoglycemic associated symptoms. Pertinent negatives for hypoglycemia include no confusion, dizziness or headaches. There are no diabetic associated symptoms. Pertinent negatives for diabetes include no chest pain and no fatigue. There are no hypoglycemic complications. There are no diabetic complications. Risk factors for coronary artery disease include hypertension and male sex. He sees a podiatrist.Eye exam is current.    (Pt states in his letter he was told to remind [place] about the flu shot. I did not see anything in letter regarding a flu shot)  Review of Systems  Constitutional: Negative for diaphoresis and fatigue.  HENT: Negative for congestion and rhinorrhea.   Respiratory: Negative for cough and shortness of breath.   Cardiovascular: Negative for chest pain and leg swelling.  Gastrointestinal: Negative for abdominal pain and diarrhea.  Skin: Negative for color change and rash.  Neurological: Negative for dizziness and headaches.  Psychiatric/Behavioral: Negative for behavioral problems and confusion.       Objective:   Physical Exam Vitals signs reviewed.  Constitutional:      General: He is not in acute distress. HENT:     Head: Normocephalic and atraumatic.  Eyes:     General:        Right eye: No discharge.        Left eye: No discharge.  Neck:     Trachea: No tracheal deviation.  Cardiovascular:     Rate and Rhythm: Normal rate and regular rhythm.     Heart sounds: Normal heart sounds. No murmur.  Pulmonary:     Effort: Pulmonary effort is normal. No  respiratory distress.     Breath sounds: Normal breath sounds.  Lymphadenopathy:     Cervical: No cervical adenopathy.  Skin:    General: Skin is warm and dry.  Neurological:     Mental Status: He is alert.     Coordination: Coordination normal.  Psychiatric:        Behavior: Behavior normal.           Assessment & Plan:  HTN- Patient was seen today as part of a visit regarding hypertension. The importance of healthy diet and regular physical activity was discussed. The importance of compliance with medications discussed.  Ideal goal is to keep blood pressure low elevated levels certainly below 242/68 when possible.  The patient was counseled that keeping blood pressure under control lessen his risk of complications.  The importance of regular follow-ups was discussed with the patient.  Low-salt diet such as DASH recommended.  Regular physical activity was recommended as well.  Patient was advised to keep regular follow-ups.  Patient does have diabetes.  We talked about dietary measures watching portions food selections and exercise we will recheck the cholesterol profile again in a few months if it is not doing better we will initiate additional medicine possibly Januvia  Hyperlipidemia to take his medication regular basis check lab work later this year follow-up if ongoing troubles  Lab work late September follow-up in early October  15 minutes was spent with patient today discussing healthcare issues which they came.  More than  50% of this visit-total duration of visit-was spent in counseling and coordination of care.  Please see diagnosis regarding the focus of this coordination and care

## 2018-12-03 NOTE — Patient Instructions (Signed)
Diabetes Mellitus and Nutrition, Adult  When you have diabetes (diabetes mellitus), it is very important to have healthy eating habits because your blood sugar (glucose) levels are greatly affected by what you eat and drink. Eating healthy foods in the appropriate amounts, at about the same times every day, can help you:  · Control your blood glucose.  · Lower your risk of heart disease.  · Improve your blood pressure.  · Reach or maintain a healthy weight.  Every person with diabetes is different, and each person has different needs for a meal plan. Your health care provider may recommend that you work with a diet and nutrition specialist (dietitian) to make a meal plan that is best for you. Your meal plan may vary depending on factors such as:  · The calories you need.  · The medicines you take.  · Your weight.  · Your blood glucose, blood pressure, and cholesterol levels.  · Your activity level.  · Other health conditions you have, such as heart or kidney disease.  How do carbohydrates affect me?  Carbohydrates, also called carbs, affect your blood glucose level more than any other type of food. Eating carbs naturally raises the amount of glucose in your blood. Carb counting is a method for keeping track of how many carbs you eat. Counting carbs is important to keep your blood glucose at a healthy level, especially if you use insulin or take certain oral diabetes medicines.  It is important to know how many carbs you can safely have in each meal. This is different for every person. Your dietitian can help you calculate how many carbs you should have at each meal and for each snack.  Foods that contain carbs include:  · Bread, cereal, rice, pasta, and crackers.  · Potatoes and corn.  · Peas, beans, and lentils.  · Milk and yogurt.  · Fruit and juice.  · Desserts, such as cakes, cookies, ice cream, and candy.  How does alcohol affect me?  Alcohol can cause a sudden decrease in blood glucose (hypoglycemia),  especially if you use insulin or take certain oral diabetes medicines. Hypoglycemia can be a life-threatening condition. Symptoms of hypoglycemia (sleepiness, dizziness, and confusion) are similar to symptoms of having too much alcohol.  If your health care provider says that alcohol is safe for you, follow these guidelines:  · Limit alcohol intake to no more than 1 drink per day for nonpregnant women and 2 drinks per day for men. One drink equals 12 oz of beer, 5 oz of wine, or 1½ oz of hard liquor.  · Do not drink on an empty stomach.  · Keep yourself hydrated with water, diet soda, or unsweetened iced tea.  · Keep in mind that regular soda, juice, and other mixers may contain a lot of sugar and must be counted as carbs.  What are tips for following this plan?    Reading food labels  · Start by checking the serving size on the "Nutrition Facts" label of packaged foods and drinks. The amount of calories, carbs, fats, and other nutrients listed on the label is based on one serving of the item. Many items contain more than one serving per package.  · Check the total grams (g) of carbs in one serving. You can calculate the number of servings of carbs in one serving by dividing the total carbs by 15. For example, if a food has 30 g of total carbs, it would be equal to 2   servings of carbs.  · Check the number of grams (g) of saturated and trans fats in one serving. Choose foods that have low or no amount of these fats.  · Check the number of milligrams (mg) of salt (sodium) in one serving. Most people should limit total sodium intake to less than 2,300 mg per day.  · Always check the nutrition information of foods labeled as "low-fat" or "nonfat". These foods may be higher in added sugar or refined carbs and should be avoided.  · Talk to your dietitian to identify your daily goals for nutrients listed on the label.  Shopping  · Avoid buying canned, premade, or processed foods. These foods tend to be high in fat, sodium,  and added sugar.  · Shop around the outside edge of the grocery store. This includes fresh fruits and vegetables, bulk grains, fresh meats, and fresh dairy.  Cooking  · Use low-heat cooking methods, such as baking, instead of high-heat cooking methods like deep frying.  · Cook using healthy oils, such as olive, canola, or sunflower oil.  · Avoid cooking with butter, cream, or high-fat meats.  Meal planning  · Eat meals and snacks regularly, preferably at the same times every day. Avoid going long periods of time without eating.  · Eat foods high in fiber, such as fresh fruits, vegetables, beans, and whole grains. Talk to your dietitian about how many servings of carbs you can eat at each meal.  · Eat 4-6 ounces (oz) of lean protein each day, such as lean meat, chicken, fish, eggs, or tofu. One oz of lean protein is equal to:  ? 1 oz of meat, chicken, or fish.  ? 1 egg.  ? ¼ cup of tofu.  · Eat some foods each day that contain healthy fats, such as avocado, nuts, seeds, and fish.  Lifestyle  · Check your blood glucose regularly.  · Exercise regularly as told by your health care provider. This may include:  ? 150 minutes of moderate-intensity or vigorous-intensity exercise each week. This could be brisk walking, biking, or water aerobics.  ? Stretching and doing strength exercises, such as yoga or weightlifting, at least 2 times a week.  · Take medicines as told by your health care provider.  · Do not use any products that contain nicotine or tobacco, such as cigarettes and e-cigarettes. If you need help quitting, ask your health care provider.  · Work with a counselor or diabetes educator to identify strategies to manage stress and any emotional and social challenges.  Questions to ask a health care provider  · Do I need to meet with a diabetes educator?  · Do I need to meet with a dietitian?  · What number can I call if I have questions?  · When are the best times to check my blood glucose?  Where to find more  information:  · American Diabetes Association: diabetes.org  · Academy of Nutrition and Dietetics: www.eatright.org  · National Institute of Diabetes and Digestive and Kidney Diseases (NIH): www.niddk.nih.gov  Summary  · A healthy meal plan will help you control your blood glucose and maintain a healthy lifestyle.  · Working with a diet and nutrition specialist (dietitian) can help you make a meal plan that is best for you.  · Keep in mind that carbohydrates (carbs) and alcohol have immediate effects on your blood glucose levels. It is important to count carbs and to use alcohol carefully.  This information is not intended to   replace advice given to you by your health care provider. Make sure you discuss any questions you have with your health care provider.  Document Released: 03/01/2005 Document Revised: 01/02/2017 Document Reviewed: 07/09/2016  Elsevier Interactive Patient Education © 2019 [employer] Inc[employer]

## 2018-12-10 DIAGNOSIS — G35 Multiple sclerosis: Secondary | ICD-10-CM | POA: Diagnosis not present

## 2018-12-24 ENCOUNTER — Ambulatory Visit: Payer: Medicare Other | Admitting: Gastroenterology

## 2018-12-26 ENCOUNTER — Other Ambulatory Visit: Payer: Self-pay | Admitting: Family Medicine

## 2018-12-31 ENCOUNTER — Other Ambulatory Visit: Payer: Self-pay | Admitting: *Deleted

## 2019-01-21 DIAGNOSIS — I10 Essential (primary) hypertension: Secondary | ICD-10-CM | POA: Diagnosis not present

## 2019-01-21 DIAGNOSIS — F418 Other specified anxiety disorders: Secondary | ICD-10-CM | POA: Diagnosis not present

## 2019-01-21 DIAGNOSIS — B20 Human immunodeficiency virus [HIV] disease: Secondary | ICD-10-CM | POA: Diagnosis not present

## 2019-01-21 DIAGNOSIS — Z792 Long term (current) use of antibiotics: Secondary | ICD-10-CM | POA: Diagnosis not present

## 2019-01-21 DIAGNOSIS — Z21 Asymptomatic human immunodeficiency virus [HIV] infection status: Secondary | ICD-10-CM | POA: Diagnosis not present

## 2019-01-21 DIAGNOSIS — G35 Multiple sclerosis: Secondary | ICD-10-CM | POA: Diagnosis not present

## 2019-01-27 ENCOUNTER — Other Ambulatory Visit: Payer: Self-pay | Admitting: Family Medicine

## 2019-02-03 ENCOUNTER — Ambulatory Visit (INDEPENDENT_AMBULATORY_CARE_PROVIDER_SITE_OTHER): Payer: Medicare Other | Admitting: Family Medicine

## 2019-02-03 ENCOUNTER — Other Ambulatory Visit: Payer: Self-pay

## 2019-02-03 DIAGNOSIS — J019 Acute sinusitis, unspecified: Secondary | ICD-10-CM | POA: Diagnosis not present

## 2019-02-03 MED ORDER — PANTOPRAZOLE SODIUM 40 MG PO TBEC: 40.0000 mg | DELAYED_RELEASE_TABLET | Freq: Every day | ORAL | 6 refills | Status: DC

## 2019-02-03 NOTE — Progress Notes (Signed)
   Subjective:    Patient ID: Kerry Solis, male    DOB: 10-13-1945, 73 y.o.   MRN: [id number]  Sore Throat  This is a new problem. Episode onset: last night. Associated symptoms include congestion and coughing. Pertinent negatives include no ear pain or vomiting. Associated symptoms comments: Pt had acid reflux last night and began to cough and this morning he had some pain in throat. He has tried nothing for the symptoms.  Patient does relate some head congestion drainage coughing he states he is around family but denies being other places wears a mask when he goes out denies high fever chills sweats but he does have multiple underlying health issues which would increase his risk for COVID Virtual Visit via Video Note  I connected with Kerry Solis on 02/03/19 at 11:00 AM EDT by a video enabled telemedicine application and verified that I am speaking with the correct person using two identifiers.  Location: Patient: home Provider: office   I discussed the limitations of evaluation and management by telemedicine and the availability of in person appointments. The patient expressed understanding and agreed to proceed.  History of Present Illness:    Observations/Objective:   Assessment and Plan:   Follow Up Instructions:    I discussed the assessment and treatment plan with the patient. The patient was provided an opportunity to ask questions and all were answered. The patient agreed with the plan and demonstrated an understanding of the instructions.   The patient was advised to call back or seek an in-person evaluation if the symptoms worsen or if the condition fails to improve as anticipated.  I provided 15 minutes of non-face-to-face time during this encounter.   Vicente Males, LPN    Review of Systems  Constitutional: Negative for activity change, chills and fever.  HENT: Positive for congestion and rhinorrhea. Negative for ear pain.   Eyes: Negative for discharge.   Respiratory: Positive for cough. Negative for wheezing.   Cardiovascular: Negative for chest pain.  Gastrointestinal: Negative for nausea and vomiting.  Musculoskeletal: Negative for arthralgias.       Objective:   Physical Exam  Today's visit was via telephone Physical exam was not possible for this visit       Assessment & Plan:  Viral illness versus acute rhinosinusitis amoxicillin prescribed warning signs discussed follow-up if progressive troubles or worse in addition to this I highly recommend patient doing COVID testing staying at home over the next several days warning signs were discussed we will follow-up with them later in the week  15 minutes was spent with patient today discussing healthcare issues which they came.  More than 50% of this visit-total duration of visit-was spent in counseling and coordination of care.  Please see diagnosis regarding the focus of this coordination and care

## 2019-02-26 ENCOUNTER — Other Ambulatory Visit: Payer: Self-pay | Admitting: Family Medicine

## 2019-03-16 ENCOUNTER — Telehealth: Payer: Self-pay | Admitting: Family Medicine

## 2019-03-16 NOTE — Telephone Encounter (Signed)
Patient is having some stomach issues for a week now . He states been having back pain also for months but would like office visit. He has no symptoms and cleared the Covid screening. Please advise

## 2019-03-16 NOTE — Telephone Encounter (Signed)
Pain in mid abdomen after eating. Pain eases about one hour after eating.   Low back pain for about 2 months. Hard to get up in the morning because it hurts to stand up straight.

## 2019-03-16 NOTE — Telephone Encounter (Signed)
It would be fine to see him this week please In office will be fine

## 2019-03-19 ENCOUNTER — Other Ambulatory Visit: Payer: Self-pay

## 2019-03-19 ENCOUNTER — Encounter (HOSPITAL_COMMUNITY): Payer: Self-pay

## 2019-03-19 ENCOUNTER — Encounter: Payer: Self-pay | Admitting: Family Medicine

## 2019-03-19 ENCOUNTER — Ambulatory Visit (INDEPENDENT_AMBULATORY_CARE_PROVIDER_SITE_OTHER): Payer: Medicare Other | Admitting: Family Medicine

## 2019-03-19 ENCOUNTER — Ambulatory Visit (HOSPITAL_COMMUNITY)
Admission: RE | Admit: 2019-03-19 | Discharge: 2019-03-19 | Disposition: A | Discharge status: Home / Self Care | Payer: Medicare Other | Source: Ambulatory Visit | Attending: Family Medicine | Admitting: Family Medicine

## 2019-03-19 VITALS — BP 136/84 | Temp 98.2°F | Wt 215.4 lb

## 2019-03-19 DIAGNOSIS — E1169 Type 2 diabetes mellitus with other specified complication: Secondary | ICD-10-CM

## 2019-03-19 DIAGNOSIS — I10 Essential (primary) hypertension: Secondary | ICD-10-CM

## 2019-03-19 DIAGNOSIS — R1030 Lower abdominal pain, unspecified: Secondary | ICD-10-CM | POA: Diagnosis not present

## 2019-03-19 DIAGNOSIS — M542 Cervicalgia: Secondary | ICD-10-CM | POA: Diagnosis not present

## 2019-03-19 DIAGNOSIS — R350 Frequency of micturition: Secondary | ICD-10-CM

## 2019-03-19 DIAGNOSIS — Z8546 Personal history of malignant neoplasm of prostate: Secondary | ICD-10-CM

## 2019-03-19 DIAGNOSIS — Z23 Encounter for immunization: Secondary | ICD-10-CM

## 2019-03-19 DIAGNOSIS — G35 Multiple sclerosis: Secondary | ICD-10-CM | POA: Diagnosis not present

## 2019-03-19 DIAGNOSIS — M545 Low back pain, unspecified: Secondary | ICD-10-CM

## 2019-03-19 DIAGNOSIS — C61 Malignant neoplasm of prostate: Secondary | ICD-10-CM | POA: Diagnosis not present

## 2019-03-19 LAB — POCT URINALYSIS DIPSTICK
Spec Grav, UA: 1.02 (ref 1.010–1.025)
pH, UA: 5 (ref 5.0–8.0)

## 2019-03-19 MED ORDER — CEPHALEXIN 500 MG PO CAPS: 500.0000 mg | ORAL_CAPSULE | Freq: Four times a day (QID) | ORAL | 0 refills | Status: DC

## 2019-03-19 MED ORDER — TAMSULOSIN HCL 0.4 MG PO CAPS: ORAL_CAPSULE | ORAL | 5 refills | Status: DC

## 2019-03-19 NOTE — Progress Notes (Signed)
Subjective:    Patient ID: Kerry Solis, male    DOB: 06-14-1946, 73 y.o.   MRN: [id number]  HPI Pt here today for lower abdominal pain that radiates around to back. Going on for about 3 months. Pt states he was constipated but that has resolved. Pt believes he is urinating to much; goes about 3-4 times a day and at least 3 times a night. Pt states that when he eats his stomach has a sore feeling.   Patient does relate some urinary frequency.  Denies dysuria denies sweats chills.  Also relates low back pain and neck pain.  Hurts with certain movements.  Patient denies any breathing issues fevers chills sweats wheezing difficulty breathing states energy level subpar  He has a history of prostate cancer.  Has not had PSA checked recently. He does have diabetes takes his medicine watches diet stays physically active. Has significant obesity but not morbidly obese. Review of Systems  Constitutional: Negative for diaphoresis and fatigue.  HENT: Negative for congestion and rhinorrhea.   Respiratory: Negative for cough and shortness of breath.   Cardiovascular: Negative for chest pain and leg swelling.  Gastrointestinal: Positive for abdominal pain. Negative for diarrhea.  Genitourinary: Positive for frequency. Negative for dysuria.  Musculoskeletal: Positive for arthralgias and back pain.  Skin: Negative for color change and rash.  Neurological: Negative for dizziness and headaches.  Psychiatric/Behavioral: Negative for behavioral problems and confusion.  Urinalysis negative negative under the microscope Prostate exam slight tenderness    Objective:   Physical Exam Vitals signs reviewed.  Constitutional:      General: He is not in acute distress. HENT:     Head: Normocephalic and atraumatic.  Eyes:     General:        Right eye: No discharge.        Left eye: No discharge.  Neck:     Trachea: No tracheal deviation.  Cardiovascular:     Rate and Rhythm: Normal rate and regular  rhythm.     Heart sounds: Normal heart sounds. No murmur.  Pulmonary:     Effort: Pulmonary effort is normal. No respiratory distress.     Breath sounds: Normal breath sounds.  Lymphadenopathy:     Cervical: No cervical adenopathy.  Skin:    General: Skin is warm and dry.  Neurological:     Mental Status: He is alert.     Coordination: Coordination normal.  Psychiatric:        Behavior: Behavior normal.    Prostate exam slight tenderness       Assessment & Plan:  Possibility of a very mild case of prostatitis Keflex 4 times daily for 10 days if ongoing trouble to notify us  1. Urinary frequency I find no evidence of UTI but the may be some prostatitis Keflex for 10 days call us if ongoing troubles - POCT Urinalysis Dipstick - Hemoglobin A1c - Lipid Profile - Hepatic function panel - Basic Metabolic Panel (BMET) - CBC with Differential - PSA  2. Cervical pain Significant cervical pain discomfort for the past few months does not radiate into the arms uses OTC measures for it.  Occasionally oxycodone x-rays ordered - DG Cervical Spine Complete - DG Lumbar Spine Complete - Hemoglobin A1c - Lipid Profile - Hepatic function panel - Basic Metabolic Panel (BMET) - CBC with Differential - PSA  3. Lumbar pain Lumbar pain and discomfort consistent with the possibility of some arthritic changes.  Does not radiate down the  legs.  X-rays ordered - Hemoglobin A1c - Lipid Profile - Hepatic function panel - Basic Metabolic Panel (BMET) - CBC with Differential - PSA  4. Essential hypertension Blood pressure under decent control continue current measures.  Minimize salt stay active - Hemoglobin A1c - Lipid Profile - Hepatic function panel - Basic Metabolic Panel (BMET) - CBC with Differential - PSA  5. Controlled type 2 diabetes mellitus with other specified complication, without long-term current use of insulin ([place]) Diabetes decent control continue current measures.   A1c under decent control.  We are doing dietary approach currently - Hemoglobin A1c - Lipid Profile - Hepatic function panel - Basic Metabolic Panel (BMET) - CBC with Differential - PSA  6. Multiple sclerosis ([place]) His MS is followed by specialist he denies any flareups recently he is on medications - Hemoglobin A1c - Lipid Profile - Hepatic function panel - Basic Metabolic Panel (BMET) - CBC with Differential - PSA  7. Prostate cancer [facility]) History prostate cancer check PSA await the results - Hemoglobin A1c - Lipid Profile - Hepatic function panel - Basic Metabolic Panel (BMET) - CBC with Differential - PSA  8. Lower abdominal pain This is a possibility due to mild prostatitis antibiotics was prescribed - POCT Urinalysis Dipstick  9. Need for vaccination Flu shot today - Flu Vaccine QUAD 6+ mos PF IM (Fluarix Quad PF) 25 minutes was spent with the patient.  This statement verifies that 25 minutes was indeed spent with the patient.  More than 50% of this visit-total duration of the visit-was spent in counseling and coordination of care. The issues that the patient came in for today as reflected in the diagnosis (s) please refer to documentation for further details.

## 2019-03-20 LAB — BASIC METABOLIC PANEL
BUN/Creatinine Ratio: 10 (ref 10–24)
BUN: 14 mg/dL (ref 8–27)
CO2: 21 mmol/L (ref 20–29)
Calcium: 10 mg/dL (ref 8.6–10.2)
Chloride: 105 mmol/L (ref 96–106)
Creatinine, Ser: 1.4 mg/dL — ABNORMAL HIGH (ref 0.76–1.27)
GFR calc Af Amer: 56 mL/min/{1.73_m2} — ABNORMAL LOW (ref >59)
GFR calc non Af Amer: 49 mL/min/{1.73_m2} — ABNORMAL LOW (ref >59)
Glucose: 79 mg/dL (ref 65–99)
Potassium: 4.5 mmol/L (ref 3.5–5.2)
Sodium: 143 mmol/L (ref 134–144)

## 2019-03-20 LAB — CBC WITH DIFFERENTIAL/PLATELET
Basophils Absolute: 0 10*3/uL (ref 0.0–0.2)
Basos: 1 %
EOS (ABSOLUTE): 0 10*3/uL (ref 0.0–0.4)
Eos: 1 %
Hematocrit: 41 % (ref 37.5–51.0)
Hemoglobin: 14 g/dL (ref 13.0–17.7)
Immature Grans (Abs): 0 10*3/uL (ref 0.0–0.1)
Immature Granulocytes: 0 %
Lymphocytes Absolute: 1.5 10*3/uL (ref 0.7–3.1)
Lymphs: 38 %
MCH: 28.9 pg (ref 26.6–33.0)
MCHC: 34.1 g/dL (ref 31.5–35.7)
MCV: 85 fL (ref 79–97)
Monocytes Absolute: 0.6 10*3/uL (ref 0.1–0.9)
Monocytes: 15 %
Neutrophils Absolute: 1.8 10*3/uL (ref 1.4–7.0)
Neutrophils: 45 %
Platelets: 232 10*3/uL (ref 150–450)
RBC: 4.85 x10E6/uL (ref 4.14–5.80)
RDW: 13.1 % (ref 11.6–15.4)
WBC: 3.8 10*3/uL (ref 3.4–10.8)

## 2019-03-20 LAB — HEPATIC FUNCTION PANEL
ALT: 39 IU/L (ref 0–44)
AST: 48 IU/L — ABNORMAL HIGH (ref 0–40)
Albumin: 4.9 g/dL — ABNORMAL HIGH (ref 3.7–4.7)
Alkaline Phosphatase: 71 IU/L (ref 39–117)
Bilirubin Total: 0.3 mg/dL (ref 0.0–1.2)
Bilirubin, Direct: 0.11 mg/dL (ref 0.00–0.40)
Total Protein: 7.6 g/dL (ref 6.0–8.5)

## 2019-03-20 LAB — LIPID PANEL
Chol/HDL Ratio: 3.8 ratio (ref 0.0–5.0)
Cholesterol, Total: 133 mg/dL (ref 100–199)
HDL: 35 mg/dL — ABNORMAL LOW (ref >39)
LDL Chol Calc (NIH): 81 mg/dL (ref 0–99)
Triglycerides: 88 mg/dL (ref 0–149)
VLDL Cholesterol Cal: 17 mg/dL (ref 5–40)

## 2019-03-20 LAB — HEMOGLOBIN A1C
Est. average glucose Bld gHb Est-mCnc: 140 mg/dL
Hgb A1c MFr Bld: 6.5 % — ABNORMAL HIGH (ref 4.8–5.6)

## 2019-03-20 LAB — PSA: Prostate Specific Ag, Serum: <0.1 ng/mL (ref 0.0–4.0)

## 2019-04-09 ENCOUNTER — Other Ambulatory Visit: Payer: Self-pay | Admitting: Family Medicine

## 2019-05-08 ENCOUNTER — Other Ambulatory Visit: Payer: Self-pay | Admitting: Family Medicine

## 2019-05-20 DIAGNOSIS — Z72820 Sleep deprivation: Secondary | ICD-10-CM | POA: Diagnosis not present

## 2019-05-20 DIAGNOSIS — R4189 Other symptoms and signs involving cognitive functions and awareness: Secondary | ICD-10-CM | POA: Diagnosis not present

## 2019-06-11 ENCOUNTER — Other Ambulatory Visit: Payer: Self-pay | Admitting: Family Medicine

## 2019-06-19 DIAGNOSIS — Z8701 Personal history of pneumonia (recurrent): Secondary | ICD-10-CM | POA: Insufficient documentation

## 2019-06-28 IMAGING — CT CT ABD-PELV W/ CM
2 of 5 series · 16 of 46 positions shown, 18 images · IV contrast (Isovue)
Comparison: CT abdomen 03/19/2017

CLINICAL DATA: Abdominal pain.  History of prostate cancer

EXAM:
CT ABDOMEN AND PELVIS WITH CONTRAST
TECHNIQUE: Multidetector CT imaging of the abdomen and pelvis was performed
using the standard protocol following bolus administration of
intravenous contrast.
CONTRAST:  100mL LRP5B2-0EE IOPAMIDOL (LRP5B2-0EE) INJECTION 61%

[Series 2: axial st · axial · 0.77mm/px · z∈[+754,+1164]mm · 13 of 94 slices shown, 15 images]
[im 6/94  soft-tissue]
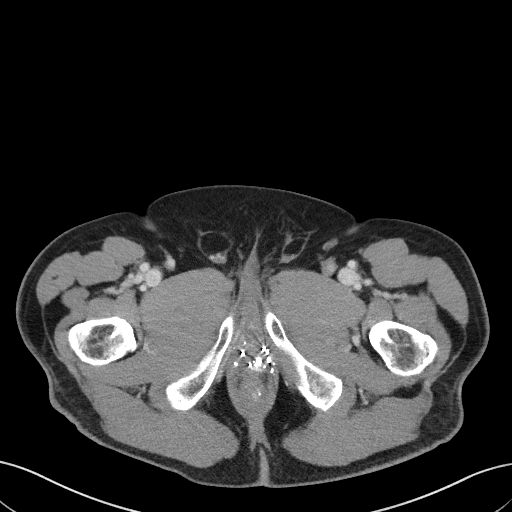
[im 6/94  bone]
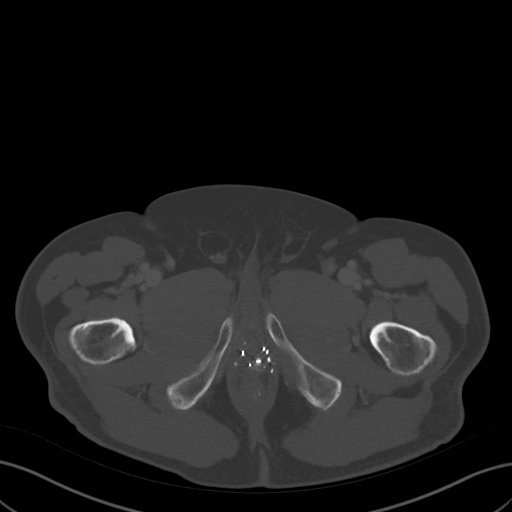
[im 11/94  soft-tissue]
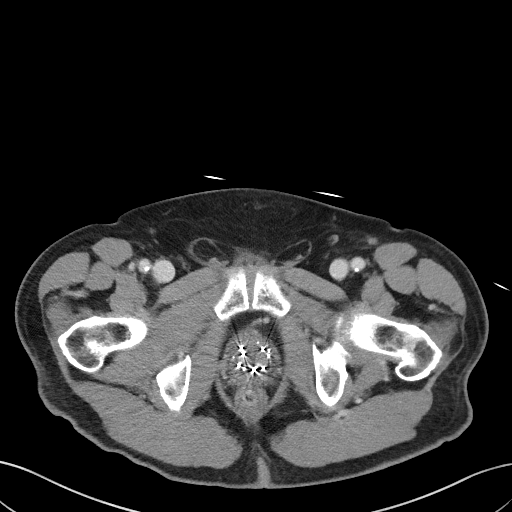
[im 21/94  soft-tissue]
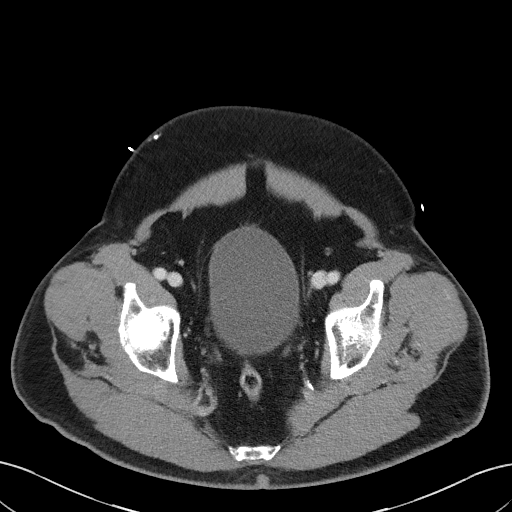
[im 26/94  soft-tissue]
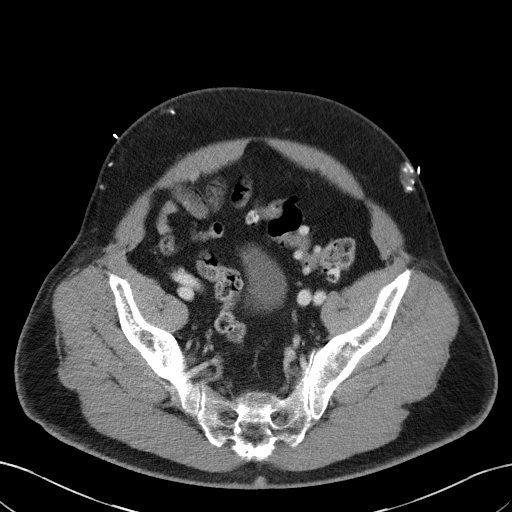
[im 32/94  soft-tissue]
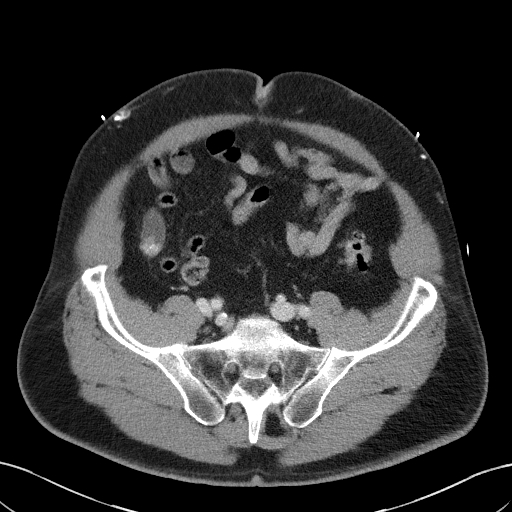
[im 42/94  soft-tissue]
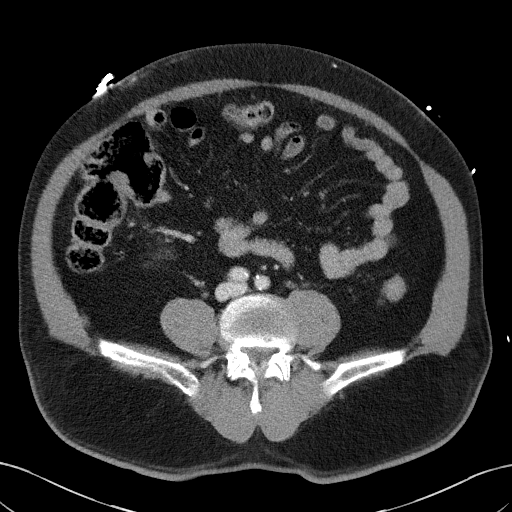
[im 47/94  soft-tissue]
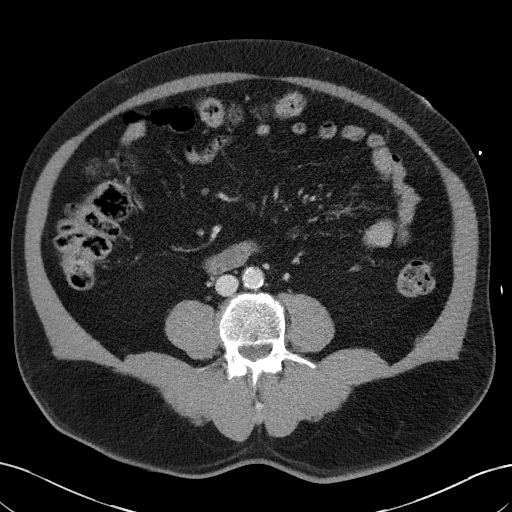
[im 52/94  soft-tissue]
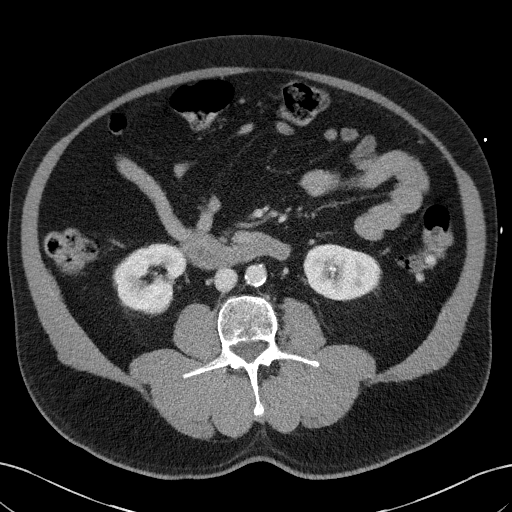
[im 63/94  soft-tissue]
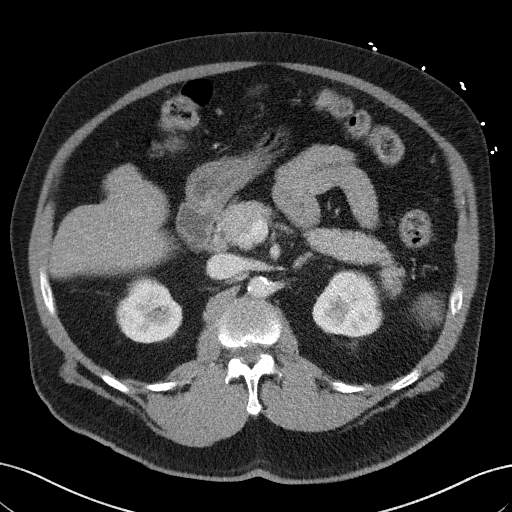
[im 63/94  bone]
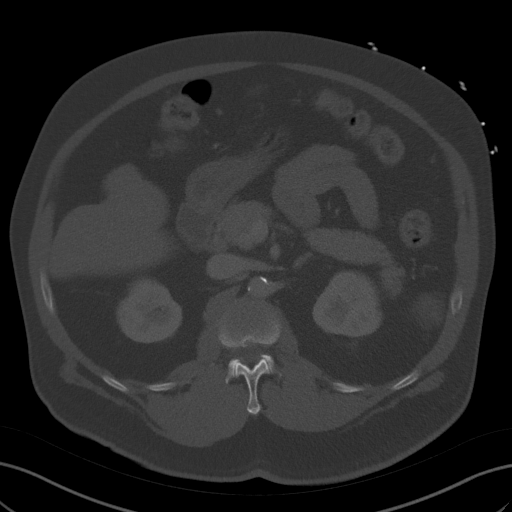
[im 68/94  soft-tissue]
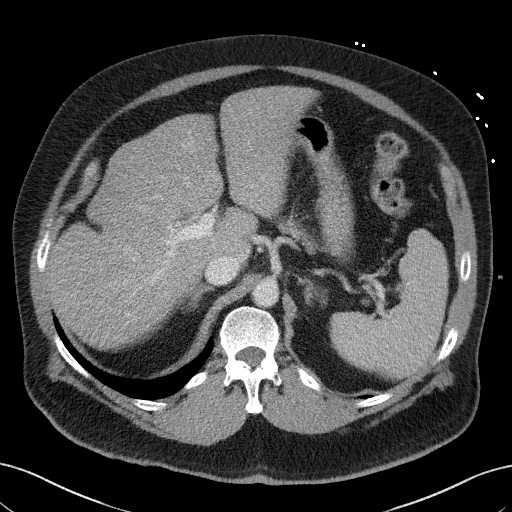
[im 73/94  soft-tissue]
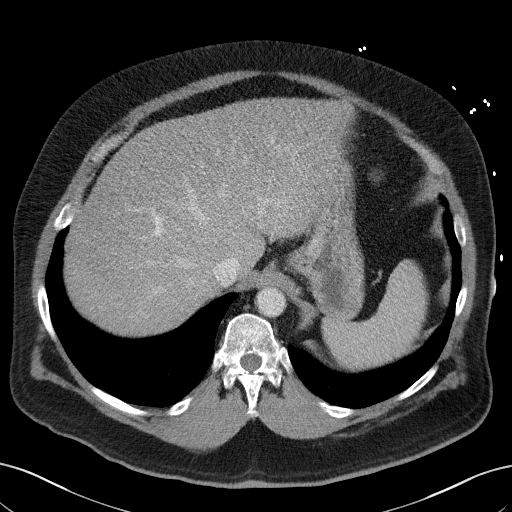
[im 83/94  soft-tissue]
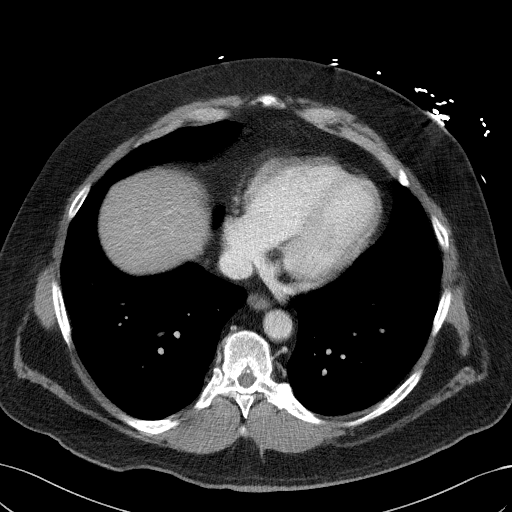
[im 88/94  soft-tissue]
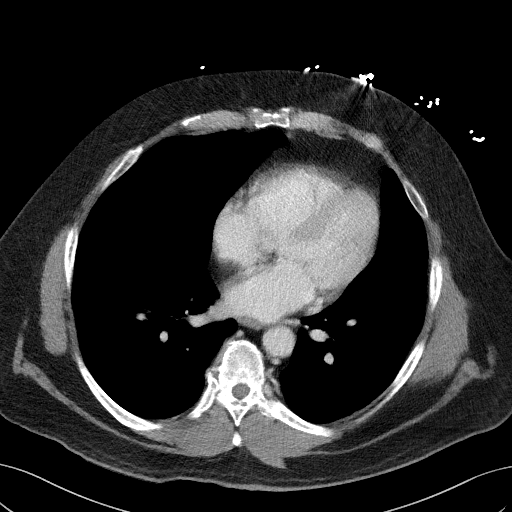

[Series 5: coronal st · coronal · 0.82mm/px · 3 of 111 slices shown]
[im 37/111  soft-tissue]
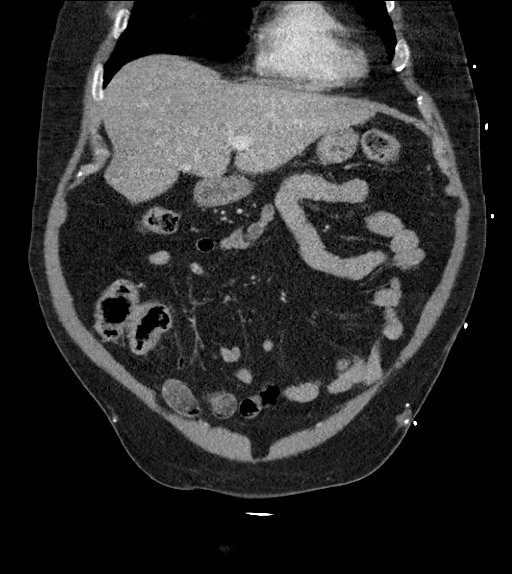
[im 49/111  soft-tissue]
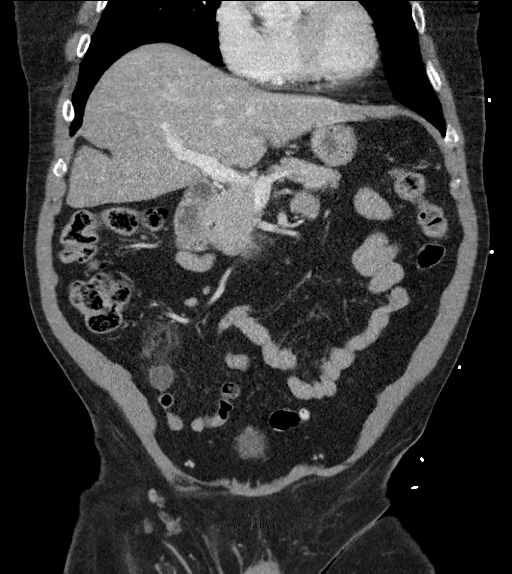
[im 62/111  soft-tissue]
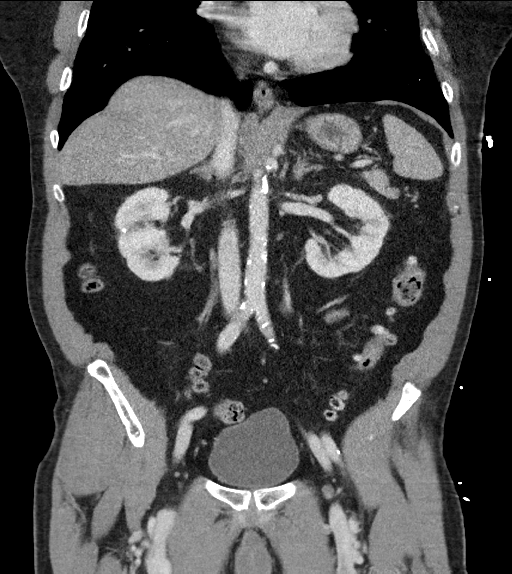

[16 of 46 positions shown; findings below may reference images not displayed]

FINDINGS: Lower chest: Lung bases clear.  Calcified granuloma left lung base.

Hepatobiliary: Fatty infiltration of liver. No focal liver lesion.
Cholecystectomy without biliary dilatation.

Pancreas: Negative

Spleen: Negative

Adrenals/Urinary Tract: No renal mass or obstruction. 3 mm
nonobstructing stone right midpole. 9 mm nonobstructing stone left
lower pole. Left lower pole cyst unchanged from the prior study.
Small right lower pole cyst also unchanged. Normal urinary bladder.

Stomach/Bowel: Markedly dilated appendix with fluid-filled lumen
containing multiple densities compatible with appendicoliths.
Periappendiceal stranding. Dilated appendix measures 18 mm in
diameter which is significantly changed from the prior study. No
abscess or free fluid. No bowel obstruction. Sigmoid diverticulosis
without diverticulitis.

Vascular/Lymphatic: Atherosclerotic disease without aneurysm. No
lymphadenopathy.

Reproductive: Radioactive seeds in the prostate without enlargement
of the prostate

Other: Negative for hernia

Musculoskeletal: Negative
IMPRESSION: 1. Acute appendicitis. The appendix is markedly dilated with
periappendiceal stranding and appendicoliths. No abscess.
2. Nonobstructing bilateral renal calculi
3. Cholecystectomy

## 2019-07-22 DIAGNOSIS — K7581 Nonalcoholic steatohepatitis (NASH): Secondary | ICD-10-CM | POA: Diagnosis not present

## 2019-07-22 DIAGNOSIS — Z792 Long term (current) use of antibiotics: Secondary | ICD-10-CM | POA: Diagnosis not present

## 2019-07-22 DIAGNOSIS — R300 Dysuria: Secondary | ICD-10-CM | POA: Diagnosis not present

## 2019-07-22 DIAGNOSIS — K047 Periapical abscess without sinus: Secondary | ICD-10-CM | POA: Diagnosis not present

## 2019-07-22 DIAGNOSIS — Z79899 Other long term (current) drug therapy: Secondary | ICD-10-CM | POA: Diagnosis not present

## 2019-07-22 DIAGNOSIS — R351 Nocturia: Secondary | ICD-10-CM | POA: Diagnosis not present

## 2019-07-22 DIAGNOSIS — K0889 Other specified disorders of teeth and supporting structures: Secondary | ICD-10-CM | POA: Diagnosis not present

## 2019-07-22 DIAGNOSIS — G35 Multiple sclerosis: Secondary | ICD-10-CM | POA: Diagnosis not present

## 2019-07-22 DIAGNOSIS — B20 Human immunodeficiency virus [HIV] disease: Secondary | ICD-10-CM | POA: Diagnosis not present

## 2019-07-22 DIAGNOSIS — I10 Essential (primary) hypertension: Secondary | ICD-10-CM | POA: Diagnosis not present

## 2019-07-22 DIAGNOSIS — Z21 Asymptomatic human immunodeficiency virus [HIV] infection status: Secondary | ICD-10-CM | POA: Diagnosis not present

## 2019-08-05 ENCOUNTER — Telehealth: Payer: Self-pay | Admitting: Family Medicine

## 2019-08-05 NOTE — Telephone Encounter (Signed)
Yes most definitely

## 2019-08-05 NOTE — Telephone Encounter (Signed)
Please advise. Thank you

## 2019-08-05 NOTE — Telephone Encounter (Signed)
Attempted to contact patient but no voicemail is set up

## 2019-08-05 NOTE — Telephone Encounter (Signed)
Want to know if you recommend him to get Covid shot. Please advise

## 2019-08-10 NOTE — Telephone Encounter (Signed)
Left message to return call 

## 2019-08-11 NOTE — Telephone Encounter (Signed)
Left message to return call 

## 2019-08-12 ENCOUNTER — Other Ambulatory Visit: Payer: Self-pay | Admitting: Family Medicine

## 2019-08-12 NOTE — Telephone Encounter (Signed)
Tried to call no answer

## 2019-08-12 NOTE — Telephone Encounter (Signed)
Last med check up 12/03/18

## 2019-08-12 NOTE — Telephone Encounter (Signed)
90-day may schedule a visit for the spring this can be virtual or in person his choice eventually we will want to do a in person

## 2019-08-13 NOTE — Telephone Encounter (Signed)
Please schedule and then route back to nurses 

## 2019-08-13 NOTE — Telephone Encounter (Signed)
lvm to schedule visit in spring

## 2019-08-14 NOTE — Telephone Encounter (Signed)
Scheduled 4/20

## 2019-08-17 NOTE — Telephone Encounter (Signed)
Left message to return call 

## 2019-08-19 NOTE — Telephone Encounter (Signed)
Left message to return call 

## 2019-08-24 NOTE — Telephone Encounter (Signed)
Pt returned call and verbalized understanding  

## 2019-09-02 ENCOUNTER — Telehealth: Payer: Self-pay | Admitting: Family Medicine

## 2019-09-02 MED ORDER — OXYCODONE-ACETAMINOPHEN 5-325 MG PO TABS: ORAL_TABLET | ORAL | 0 refills | Status: DC

## 2019-09-02 NOTE — Telephone Encounter (Signed)
Med is pended. Will call pt after rx is signed

## 2019-09-02 NOTE — Telephone Encounter (Signed)
Patient took Covid shot on 3/9 and states stomach has been upset since he took the shot. He is also requesting refill on hydrocodone for joint pain. He states hadnt had a refill on pain medication in awhile.Please advise

## 2019-09-02 NOTE — Telephone Encounter (Signed)
Called and discussed with pt. Pt declined appt at this time. He was notified that script was sent in.

## 2019-09-02 NOTE — Telephone Encounter (Signed)
Patient may have oxycodone 5 mg / 325 mg, 1 taken 3 times daily as needed for pain caution drowsiness #15 May pend the prescription If the abdominal discomfort bothers him a lot we can see him in the office to check it out as well especially if he has a rough night tonight or feels bad in the morning he should call and we would do a office visit

## 2019-09-02 NOTE — Telephone Encounter (Signed)
Since covid shot on 3/9 stomach has been hurting  in the middle of his abdomen. Pain is dull and stays all the time. No fever, no diarrhea, no vomiting.  Pain in bones that has been going on for awhile. Pain from hip to ankle. Pain mostly at night when lying still. States he talked to dr scott about it in the past. Takes tylenol and it does not help any. Would like to see if he can get oxycodone for pain. States he usually gets a small script because he does not take often.   [place].

## 2019-09-02 NOTE — Telephone Encounter (Signed)
Prescription sent as requested.

## 2019-09-09 ENCOUNTER — Ambulatory Visit (INDEPENDENT_AMBULATORY_CARE_PROVIDER_SITE_OTHER): Payer: Medicare Other | Admitting: Family Medicine

## 2019-09-09 ENCOUNTER — Other Ambulatory Visit: Payer: Self-pay

## 2019-09-09 ENCOUNTER — Encounter: Payer: Self-pay | Admitting: Family Medicine

## 2019-09-09 VITALS — BP 134/82 | Temp 97.9°F | Ht 68.0 in | Wt 200.8 lb

## 2019-09-09 DIAGNOSIS — K760 Fatty (change of) liver, not elsewhere classified: Secondary | ICD-10-CM | POA: Diagnosis not present

## 2019-09-09 DIAGNOSIS — E785 Hyperlipidemia, unspecified: Secondary | ICD-10-CM

## 2019-09-09 DIAGNOSIS — C61 Malignant neoplasm of prostate: Secondary | ICD-10-CM

## 2019-09-09 DIAGNOSIS — G35 Multiple sclerosis: Secondary | ICD-10-CM

## 2019-09-09 DIAGNOSIS — M1711 Unilateral primary osteoarthritis, right knee: Secondary | ICD-10-CM

## 2019-09-09 DIAGNOSIS — R7303 Prediabetes: Secondary | ICD-10-CM | POA: Diagnosis not present

## 2019-09-09 DIAGNOSIS — E1169 Type 2 diabetes mellitus with other specified complication: Secondary | ICD-10-CM | POA: Diagnosis not present

## 2019-09-09 DIAGNOSIS — M79661 Pain in right lower leg: Secondary | ICD-10-CM

## 2019-09-09 DIAGNOSIS — M25561 Pain in right knee: Secondary | ICD-10-CM | POA: Diagnosis not present

## 2019-09-09 DIAGNOSIS — Z79899 Other long term (current) drug therapy: Secondary | ICD-10-CM | POA: Diagnosis not present

## 2019-09-09 DIAGNOSIS — I10 Essential (primary) hypertension: Secondary | ICD-10-CM | POA: Diagnosis not present

## 2019-09-09 DIAGNOSIS — L040 Acute lymphadenitis of face, head and neck: Secondary | ICD-10-CM | POA: Diagnosis not present

## 2019-09-09 MED ORDER — HYDROCODONE-ACETAMINOPHEN 5-325 MG PO TABS: ORAL_TABLET | ORAL | 0 refills | Status: DC

## 2019-09-09 MED ORDER — AMLODIPINE BESYLATE 5 MG PO TABS: ORAL_TABLET | ORAL | 1 refills | Status: DC

## 2019-09-09 MED ORDER — SERTRALINE HCL 100 MG PO TABS: 100.0000 mg | ORAL_TABLET | Freq: Every day | ORAL | 1 refills | Status: DC

## 2019-09-09 NOTE — Progress Notes (Signed)
Subjective:    Patient ID: Kerry Solis, male    DOB: 1945-07-01, 74 y.o.   MRN: [id number]  HPIpain in right leg for over one year. Getting worse. Pain mostly in right knee and ankle. Takes oxycodone 5/325 for pain and it does help.  Patient has chronic pain in the right knee as well the lower leg also the hips uses oxycodone occasionally but states it does make him feel somewhat drowsy therefore I recommended that we reduce it to hydrocodone  He does have diabetes tries to watch his diet denies any low sugar spells.  Tries to keep A1c under decent control Has MS see specialist on a regular basis denies any major setbacks or problems Also has HIV sees infectious disease at [facility] on a regular basis they are keeping things under good control he is under very good control Blood pressure watches salt in diet stays active Essential hypertension - Plan: Basic metabolic panel  Fatty liver  Controlled type 2 diabetes mellitus with other specified complication, without long-term current use of insulin (HCC)  Multiple sclerosis ([place])  Prostate cancer ([place])  Primary osteoarthritis of right knee  Pain of right lower leg - Plan: DG Tibia/Fibula Right  Right knee pain, unspecified chronicity - Plan: DG Knee Complete 4 Views Right  Hyperlipidemia, unspecified hyperlipidemia type - Plan: Lipid panel  Prediabetes - Plan: Hemoglobin A1c  High risk medication use - Plan: Hepatic function panel  Cervical lymph node abscess  Fall Risk  09/09/2019 03/19/2019 05/08/2018 12/21/2016 07/30/2016  Falls in the past year? 1 0 1 No No  Comment - - Emmi Telephone Survey: data to providers prior to load - -  Number falls in past yr: 1 - 1 - -  Comment - - Emmi Telephone Survey Actual Response = 3 - -  Injury with Fall? 1 - 0 - -  Comment knot on face from the fall that happened in december - - - -  Risk Factor Category  - - - - -  Risk for fall due to : Impaired mobility - - - -  Risk for fall due to:  Comment walks with a cane - - - -  Follow up Education provided Falls evaluation completed - - -     Review of Systems  Constitutional: Negative for diaphoresis and fatigue.  HENT: Negative for congestion and rhinorrhea.   Respiratory: Negative for cough and shortness of breath.   Cardiovascular: Negative for chest pain and leg swelling.  Gastrointestinal: Negative for abdominal pain and diarrhea.  Skin: Negative for color change and rash.  Neurological: Negative for dizziness and headaches.  Psychiatric/Behavioral: Negative for behavioral problems and confusion.       Objective:   Physical Exam Vitals reviewed.  Constitutional:      General: He is not in acute distress. HENT:     Head: Normocephalic and atraumatic.  Eyes:     General:        Right eye: No discharge.        Left eye: No discharge.  Neck:     Trachea: No tracheal deviation.  Cardiovascular:     Rate and Rhythm: Normal rate and regular rhythm.     Heart sounds: Normal heart sounds. No murmur.  Pulmonary:     Effort: Pulmonary effort is normal. No respiratory distress.     Breath sounds: Normal breath sounds.  Lymphadenopathy:     Cervical: No cervical adenopathy.  Skin:    General: Skin is  warm and dry.  Neurological:     Mental Status: He is alert.     Coordination: Coordination normal.  Psychiatric:        Behavior: Behavior normal.     Firm lymph node noted in the right posterior chain patient states this area was much bigger several weeks ago his specialist treated it with antibiotics but because it still present in approximately a little less than 1 cm I recommend that we recheck again in 4 to 6 weeks if ongoing may need referral to ENT      Assessment & Plan:  1. Essential hypertension Blood pressure good control continue current medication minimize salt diet check kidney functions - Basic metabolic panel  2. Fatty liver History fatty liver patient has been losing weight continue to eat  healthy lose weight check liver function await the results  3. Controlled type 2 diabetes mellitus with other specified complication, without long-term current use of insulin (HCC) A1c under decent control watching diet check A1c await the results  4. Multiple sclerosis ([place]) Followed by specialist overall doing well  5. Prostate cancer [facility]) Patient had radioactive seeds PSA stayed below 0.1 follow-up yearly  6. Primary osteoarthritis of right knee Significant osteoarthritis of the knee relates pain makes it difficult for him to tolerate oxycodone causes of drowsiness therefore shift over the hydrocodone no more than 1/day  7. Pain of right lower leg Lower leg pain and discomfort x-ray indicated await the results - DG Tibia/Fibula Right  8. Right knee pain, unspecified chronicity X-ray indicated await results this aches some more than he is up on it and also in the meantime more than likely osteoarthritis but with his history of cancer need to make sure there is no bone lesions - DG Knee Complete 4 Views Right  9. Hyperlipidemia, unspecified hyperlipidemia type Continue medication check lab work previous labs reviewed - Lipid panel  10. Prediabetes Watch diet check A1c await results - Hemoglobin A1c  11. High risk medication use See above - Hepatic function panel  Finally patient has a cervical lymph node right posterior neck this needs to be rechecked again in 4 to 6 weeks 30 minutes spent with patient between multiple health issues ordering test interpretation of previous tests discussion of his health issues renewing medications

## 2019-09-16 ENCOUNTER — Ambulatory Visit (HOSPITAL_COMMUNITY)
Admission: RE | Admit: 2019-09-16 | Discharge: 2019-09-16 | Disposition: A | Discharge status: Home / Self Care | Payer: Medicare Other | Source: Ambulatory Visit | Attending: Family Medicine | Admitting: Family Medicine

## 2019-09-16 ENCOUNTER — Other Ambulatory Visit: Payer: Self-pay

## 2019-09-16 DIAGNOSIS — M25561 Pain in right knee: Secondary | ICD-10-CM | POA: Diagnosis not present

## 2019-09-16 DIAGNOSIS — R7303 Prediabetes: Secondary | ICD-10-CM | POA: Diagnosis not present

## 2019-09-16 DIAGNOSIS — M79604 Pain in right leg: Secondary | ICD-10-CM | POA: Diagnosis not present

## 2019-09-16 DIAGNOSIS — E785 Hyperlipidemia, unspecified: Secondary | ICD-10-CM | POA: Diagnosis not present

## 2019-09-16 DIAGNOSIS — M79661 Pain in right lower leg: Secondary | ICD-10-CM | POA: Diagnosis not present

## 2019-09-16 DIAGNOSIS — I10 Essential (primary) hypertension: Secondary | ICD-10-CM | POA: Diagnosis not present

## 2019-09-16 DIAGNOSIS — Z79899 Other long term (current) drug therapy: Secondary | ICD-10-CM | POA: Diagnosis not present

## 2019-09-16 DIAGNOSIS — C61 Malignant neoplasm of prostate: Secondary | ICD-10-CM | POA: Diagnosis not present

## 2019-09-17 LAB — HEPATIC FUNCTION PANEL
ALT: 22 IU/L (ref 0–44)
AST: 28 IU/L (ref 0–40)
Albumin: 4.9 g/dL — ABNORMAL HIGH (ref 3.7–4.7)
Alkaline Phosphatase: 68 IU/L (ref 39–117)
Bilirubin Total: 0.4 mg/dL (ref 0.0–1.2)
Bilirubin, Direct: 0.12 mg/dL (ref 0.00–0.40)
Total Protein: 7.5 g/dL (ref 6.0–8.5)

## 2019-09-17 LAB — LIPID PANEL
Chol/HDL Ratio: 3.4 ratio (ref 0.0–5.0)
Cholesterol, Total: 138 mg/dL (ref 100–199)
HDL: 41 mg/dL (ref >39)
LDL Chol Calc (NIH): 83 mg/dL (ref 0–99)
Triglycerides: 69 mg/dL (ref 0–149)
VLDL Cholesterol Cal: 14 mg/dL (ref 5–40)

## 2019-09-17 LAB — BASIC METABOLIC PANEL
BUN/Creatinine Ratio: 9 — ABNORMAL LOW (ref 10–24)
BUN: 12 mg/dL (ref 8–27)
CO2: 20 mmol/L (ref 20–29)
Calcium: 9.7 mg/dL (ref 8.6–10.2)
Chloride: 108 mmol/L — ABNORMAL HIGH (ref 96–106)
Creatinine, Ser: 1.28 mg/dL — ABNORMAL HIGH (ref 0.76–1.27)
GFR calc Af Amer: 63 mL/min/{1.73_m2} (ref >59)
GFR calc non Af Amer: 54 mL/min/{1.73_m2} — ABNORMAL LOW (ref >59)
Glucose: 104 mg/dL — ABNORMAL HIGH (ref 65–99)
Potassium: 4.4 mmol/L (ref 3.5–5.2)
Sodium: 145 mmol/L — ABNORMAL HIGH (ref 134–144)

## 2019-09-17 LAB — HEMOGLOBIN A1C
Est. average glucose Bld gHb Est-mCnc: 131 mg/dL
Hgb A1c MFr Bld: 6.2 % — ABNORMAL HIGH (ref 4.8–5.6)

## 2019-09-18 ENCOUNTER — Encounter: Payer: Self-pay | Admitting: Family Medicine

## 2019-09-22 DIAGNOSIS — Z23 Encounter for immunization: Secondary | ICD-10-CM | POA: Diagnosis not present

## 2019-10-06 ENCOUNTER — Telehealth: Payer: Self-pay | Admitting: *Deleted

## 2019-10-06 ENCOUNTER — Telehealth (INDEPENDENT_AMBULATORY_CARE_PROVIDER_SITE_OTHER): Payer: Medicare Other | Admitting: Family Medicine

## 2019-10-06 DIAGNOSIS — I10 Essential (primary) hypertension: Secondary | ICD-10-CM

## 2019-10-06 NOTE — Telephone Encounter (Signed)
[name], derring are scheduled for a virtual visit with your provider today.    Just as we do with appointments in the office, we must obtain your consent to participate.  Your consent will be active for this visit and any virtual visit you may have with one of our providers in the next 365 days.    If you have a MyChart account, I can also send a copy of this consent to you electronically.  All virtual visits are billed to your insurance company just like a traditional visit in the office.  As this is a virtual visit, video technology does not allow for your provider to perform a traditional examination.  This may limit your provider's ability to fully assess your condition.  If your provider identifies any concerns that need to be evaluated in person or the need to arrange testing such as labs, EKG, etc, we will make arrangements to do so.    Although advances in technology are sophisticated, we cannot ensure that it will always work on either your end or our end.  If the connection with a video visit is poor, we may have to switch to a telephone visit.  With either a video or telephone visit, we are not always able to ensure that we have a secure connection.   I need to obtain your verbal consent now.   Are you willing to proceed with your visit today?   [name] has provided verbal consent on 10/06/2019 for a virtual visit (video or telephone).   Mitzie Na, RN 10/06/2019  8:44 AM

## 2019-10-06 NOTE — Progress Notes (Signed)
   Subjective:    Patient ID: Kerry Solis, male    DOB: 1946/05/16, 74 y.o.   MRN: [id number]  Hypertension This is a chronic problem. The current episode started more than 1 year ago. Risk factors for coronary artery disease include dyslipidemia and male gender. Treatments tried: norvasc, lisinopril. There are no compliance problems.    Phone visit Unable to check the nodule on the back of his neck Patient will come in tomorrow at 11:00   Review of Systems  Virtual Visit via Video Note  I connected with Kerry Solis on 10/06/19 at  9:00 AM EDT by a video enabled telemedicine application and verified that I am speaking with the correct person using two identifiers.  Location: Patient: home Provider: office   I discussed the limitations of evaluation and management by telemedicine and the availability of in person appointments. The patient expressed understanding and agreed to proceed.  History of Present Illness:    Observations/Objective:   Assessment and Plan:   Follow Up Instructions:    I discussed the assessment and treatment plan with the patient. The patient was provided an opportunity to ask questions and all were answered. The patient agreed with the plan and demonstrated an understanding of the instructions.   The patient was advised to call back or seek an in-person evaluation if the symptoms worsen or if the condition fails to improve as anticipated.  I provided 10 minutes of non-face-to-face time during this encounter.        Objective:   Physical Exam        Assessment & Plan:  Unable to do a full necessary exam on the phone visit therefore will come in tomorrow for in person visit we will charge for that visit thank you

## 2019-10-07 ENCOUNTER — Ambulatory Visit (INDEPENDENT_AMBULATORY_CARE_PROVIDER_SITE_OTHER): Payer: Medicare Other | Admitting: Family Medicine

## 2019-10-07 ENCOUNTER — Other Ambulatory Visit: Payer: Self-pay

## 2019-10-07 DIAGNOSIS — R590 Localized enlarged lymph nodes: Secondary | ICD-10-CM | POA: Diagnosis not present

## 2019-10-07 NOTE — Progress Notes (Signed)
   Subjective:    Patient ID: Kerry Solis, male    DOB: September 04, 1945, 74 y.o.   MRN: [id number]  HPI Patient had virtual visit yesterday unable to do physical exam therefore there was no charge yesterday He presents today for follow-up denies chest tightness pressure pain denies sweats chills nausea vomiting. Relates some pain and discomfort in his leg as well as hips Denies weight loss denies sweats denies fevers none does have hard lymphadenopathy on the right neck region but it is reducing in size Patient arrives to have nodule checked on the back of the neck.  Review of Systems  Constitutional: Negative for activity change, fatigue and fever.  HENT: Negative for congestion and rhinorrhea.   Respiratory: Negative for cough and shortness of breath.   Cardiovascular: Negative for chest pain and leg swelling.  Gastrointestinal: Negative for abdominal pain, diarrhea and nausea.  Genitourinary: Negative for dysuria and hematuria.  Neurological: Negative for weakness and headaches.  Psychiatric/Behavioral: Negative for agitation and behavioral problems.       Objective:   Physical Exam Vitals reviewed.  Cardiovascular:     Rate and Rhythm: Normal rate and regular rhythm.     Heart sounds: Normal heart sounds. No murmur.  Pulmonary:     Effort: Pulmonary effort is normal.     Breath sounds: Normal breath sounds.  Lymphadenopathy:     Cervical: No cervical adenopathy.  Neurological:     Mental Status: He is alert.  Psychiatric:        Behavior: Behavior normal.           Assessment & Plan:  Nodule behind the right neck region.  Concerning for a possibility of a growth Recommend ultrasound Ultrasound pending based on this may need fine-needle aspiration

## 2019-10-08 ENCOUNTER — Encounter: Payer: Self-pay | Admitting: Family Medicine

## 2019-10-08 ENCOUNTER — Telehealth: Payer: Self-pay | Admitting: Family Medicine

## 2019-10-08 NOTE — Telephone Encounter (Signed)
Informed pt .

## 2019-10-08 NOTE — Telephone Encounter (Signed)
Tried to call to inform of ultrasound appointment information, no voicemail set up - need to inform pt  Neck U/S @ [facility] 10/13/2019, arrive 12:15pm - ONLY liquids for 4 hours prior to exam, please wear a mask

## 2019-10-09 ENCOUNTER — Other Ambulatory Visit: Payer: Self-pay | Admitting: Family Medicine

## 2019-10-13 ENCOUNTER — Ambulatory Visit (HOSPITAL_COMMUNITY)
Admission: RE | Admit: 2019-10-13 | Discharge: 2019-10-13 | Disposition: A | Discharge status: Home / Self Care | Payer: Medicare Other | Source: Ambulatory Visit | Attending: Family Medicine | Admitting: Family Medicine

## 2019-10-13 ENCOUNTER — Other Ambulatory Visit: Payer: Self-pay

## 2019-10-13 DIAGNOSIS — R590 Localized enlarged lymph nodes: Secondary | ICD-10-CM | POA: Insufficient documentation

## 2019-10-13 DIAGNOSIS — E041 Nontoxic single thyroid nodule: Secondary | ICD-10-CM | POA: Diagnosis not present

## 2019-10-21 ENCOUNTER — Encounter: Payer: Self-pay | Admitting: Family Medicine

## 2019-10-21 ENCOUNTER — Other Ambulatory Visit: Payer: Self-pay

## 2019-10-21 ENCOUNTER — Ambulatory Visit (INDEPENDENT_AMBULATORY_CARE_PROVIDER_SITE_OTHER): Payer: Medicare Other | Admitting: Family Medicine

## 2019-10-21 VITALS — BP 124/76 | Temp 97.1°F | Ht 68.0 in | Wt 193.6 lb

## 2019-10-21 DIAGNOSIS — R270 Ataxia, unspecified: Secondary | ICD-10-CM | POA: Diagnosis not present

## 2019-10-21 DIAGNOSIS — D170 Benign lipomatous neoplasm of skin and subcutaneous tissue of head, face and neck: Secondary | ICD-10-CM

## 2019-10-21 NOTE — Progress Notes (Signed)
   Subjective:    Patient ID: Kerry Solis, male    DOB: 17-Jan-1946, 74 y.o.   MRN: [id number]  HPIfollow up on test results.   Pt concerned about balance. when he walks sometimes he starts turning to the right.  Fall Risk  10/06/2019 09/09/2019 03/19/2019 05/08/2018 12/21/2016  Falls in the past year? 1 1 0 1 No  Comment - - - Emmi Telephone Survey: data to providers prior to load -  Number falls in past yr: 1 1 - 1 -  Comment - - - Emmi Telephone Survey Actual Response = 3 -  Injury with Fall? 0 1 - 0 -  Comment - knot on face from the fall that happened in december - - -  Risk Factor Category  - - - - -  Risk for fall due to : Impaired balance/gait;Impaired mobility Impaired mobility - - -  Risk for fall due to: Comment - walks with a cane - - -  Follow up Falls evaluation completed;Education provided Education provided Falls evaluation completed - -      Review of Systems  Constitutional: Negative for diaphoresis and fatigue.  HENT: Negative for congestion and rhinorrhea.   Respiratory: Negative for cough and shortness of breath.   Cardiovascular: Negative for chest pain and leg swelling.  Gastrointestinal: Negative for abdominal pain and diarrhea.  Skin: Negative for color change and rash.  Neurological: Negative for dizziness and headaches.  Psychiatric/Behavioral: Negative for behavioral problems and confusion.       Objective:   Physical Exam Vitals reviewed.  Constitutional:      General: He is not in acute distress. HENT:     Head: Normocephalic and atraumatic.  Eyes:     General:        Right eye: No discharge.        Left eye: No discharge.  Neck:     Trachea: No tracheal deviation.  Cardiovascular:     Rate and Rhythm: Normal rate and regular rhythm.     Heart sounds: Normal heart sounds. No murmur.  Pulmonary:     Effort: Pulmonary effort is normal. No respiratory distress.     Breath sounds: Normal breath sounds.  Lymphadenopathy:     Cervical: No  cervical adenopathy.  Skin:    General: Skin is warm and dry.  Neurological:     Mental Status: He is alert.     Coordination: Coordination normal.  Psychiatric:        Behavior: Behavior normal.   The area on the back of the neck on the right side is less than half centimeter in diameter soft under ultrasound it was judged as a potential lipoma        Assessment & Plan:  1. Ataxia Proper balance training discussed no sign of stroke probably related to MS uses cane when he walks around please  2. Lipoma of neck It is smaller hard to know if this is a lipoma or a small lymph node recommend rechecking this again in approximately 3 months time follow-up sooner problems does not need biopsy or removal currently

## 2019-12-08 ENCOUNTER — Other Ambulatory Visit: Payer: Self-pay | Admitting: Family Medicine

## 2019-12-28 ENCOUNTER — Telehealth: Payer: Self-pay | Admitting: Family Medicine

## 2019-12-28 NOTE — Telephone Encounter (Signed)
Pt walked in office for appt this morning for weight loss and not eating. Pt also requesting pain medication. Pt has an appt on 7/14

## 2019-12-28 NOTE — Telephone Encounter (Signed)
Left message to return call. (reason for pain med)

## 2019-12-30 ENCOUNTER — Telehealth: Payer: Self-pay | Admitting: Family Medicine

## 2019-12-30 ENCOUNTER — Ambulatory Visit (INDEPENDENT_AMBULATORY_CARE_PROVIDER_SITE_OTHER): Payer: Medicare Other | Admitting: Family Medicine

## 2019-12-30 ENCOUNTER — Other Ambulatory Visit: Payer: Self-pay

## 2019-12-30 ENCOUNTER — Encounter: Payer: Self-pay | Admitting: Family Medicine

## 2019-12-30 ENCOUNTER — Other Ambulatory Visit (HOSPITAL_COMMUNITY)
Admission: RE | Admit: 2019-12-30 | Discharge: 2019-12-30 | Disposition: A | Discharge status: Home / Self Care | Payer: Medicare Other | Source: Ambulatory Visit | Attending: Family Medicine | Admitting: Family Medicine

## 2019-12-30 VITALS — BP 128/86 | Temp 97.2°F | Ht 68.0 in | Wt 188.8 lb

## 2019-12-30 DIAGNOSIS — E1169 Type 2 diabetes mellitus with other specified complication: Secondary | ICD-10-CM | POA: Diagnosis not present

## 2019-12-30 DIAGNOSIS — Z8546 Personal history of malignant neoplasm of prostate: Secondary | ICD-10-CM | POA: Diagnosis not present

## 2019-12-30 DIAGNOSIS — R0609 Other forms of dyspnea: Secondary | ICD-10-CM

## 2019-12-30 DIAGNOSIS — D6489 Other specified anemias: Secondary | ICD-10-CM | POA: Insufficient documentation

## 2019-12-30 DIAGNOSIS — R531 Weakness: Secondary | ICD-10-CM | POA: Diagnosis not present

## 2019-12-30 DIAGNOSIS — R634 Abnormal weight loss: Secondary | ICD-10-CM

## 2019-12-30 DIAGNOSIS — R0600 Dyspnea, unspecified: Secondary | ICD-10-CM | POA: Insufficient documentation

## 2019-12-30 DIAGNOSIS — R5383 Other fatigue: Secondary | ICD-10-CM | POA: Insufficient documentation

## 2019-12-30 DIAGNOSIS — R748 Abnormal levels of other serum enzymes: Secondary | ICD-10-CM

## 2019-12-30 LAB — COMPREHENSIVE METABOLIC PANEL
ALT: 57 U/L — ABNORMAL HIGH (ref 0–44)
AST: 70 U/L — ABNORMAL HIGH (ref 15–41)
Albumin: 3.9 g/dL (ref 3.5–5.0)
Alkaline Phosphatase: 90 U/L (ref 38–126)
Anion gap: 15 (ref 5–15)
BUN: 27 mg/dL — ABNORMAL HIGH (ref 8–23)
CO2: 20 mmol/L — ABNORMAL LOW (ref 22–32)
Calcium: 9.4 mg/dL (ref 8.9–10.3)
Chloride: 100 mmol/L (ref 98–111)
Creatinine, Ser: 1.36 mg/dL — ABNORMAL HIGH (ref 0.61–1.24)
GFR calc Af Amer: 58 mL/min — ABNORMAL LOW (ref >60)
GFR calc non Af Amer: 50 mL/min — ABNORMAL LOW (ref >60)
Glucose, Bld: 107 mg/dL — ABNORMAL HIGH (ref 70–99)
Potassium: 4.1 mmol/L (ref 3.5–5.1)
Sodium: 135 mmol/L (ref 135–145)
Total Bilirubin: 1.1 mg/dL (ref 0.3–1.2)
Total Protein: 8.8 g/dL — ABNORMAL HIGH (ref 6.5–8.1)

## 2019-12-30 LAB — CBC WITH DIFFERENTIAL/PLATELET
Abs Immature Granulocytes: 0.11 10*3/uL — ABNORMAL HIGH (ref 0.00–0.07)
Basophils Absolute: 0 10*3/uL (ref 0.0–0.1)
Basophils Relative: 0 %
Eosinophils Absolute: 0 10*3/uL (ref 0.0–0.5)
Eosinophils Relative: 0 %
HCT: 40.9 % (ref 39.0–52.0)
Hemoglobin: 12.7 g/dL — ABNORMAL LOW (ref 13.0–17.0)
Immature Granulocytes: 1 %
Lymphocytes Relative: 8 %
Lymphs Abs: 1 10*3/uL (ref 0.7–4.0)
MCH: 28 pg (ref 26.0–34.0)
MCHC: 31.1 g/dL (ref 30.0–36.0)
MCV: 90.1 fL (ref 80.0–100.0)
Monocytes Absolute: 1.8 10*3/uL — ABNORMAL HIGH (ref 0.1–1.0)
Monocytes Relative: 13 %
Neutro Abs: 10.6 10*3/uL — ABNORMAL HIGH (ref 1.7–7.7)
Neutrophils Relative %: 78 %
Platelets: 429 10*3/uL — ABNORMAL HIGH (ref 150–400)
RBC: 4.54 MIL/uL (ref 4.22–5.81)
RDW: 14.5 % (ref 11.5–15.5)
WBC: 13.6 10*3/uL — ABNORMAL HIGH (ref 4.0–10.5)
nRBC: 0 % (ref 0.0–0.2)

## 2019-12-30 LAB — LIPASE, BLOOD: Lipase: 48 U/L (ref 11–51)

## 2019-12-30 LAB — POCT HEMOGLOBIN: Hemoglobin: 13 g/dL (ref 11–14.6)

## 2019-12-30 MED ORDER — BUPROPION HCL ER (SR) 150 MG PO TB12: 150.0000 mg | ORAL_TABLET | Freq: Two times a day (BID) | ORAL | 2 refills | Status: DC

## 2019-12-30 MED ORDER — ALPRAZOLAM 0.25 MG PO TABS
0.2500 mg | ORAL_TABLET | Freq: Two times a day (BID) | ORAL | 0 refills | Status: DC | PRN
Start: 2019-12-30 — End: 2020-02-23

## 2019-12-30 NOTE — Addendum Note (Signed)
Addended by: Vicente Males on: 12/30/2019 03:57 PM   Modules accepted: Orders

## 2019-12-30 NOTE — Telephone Encounter (Signed)
Pt had appt with provider today

## 2019-12-30 NOTE — Telephone Encounter (Signed)
Please see result note [name] slightly elevated Also liver enzymes are elevated Patient will need ultrasound of the abdomen due to elevated liver enzymes and weight loss I sent in medications for him for depression-Wellbutrin This is to be added to his current medications I also sent in Xanax that he can use when necessary when anxious These were sent to [facility]  Nurses-please schedule ultrasound Please inform patient Also asked patient if he has any history of seizures? (Let me know the response to that) Please set the patient up for a follow-up visit next week with me midweek This patient has mild forgetfulness please ask him if he would like for [place] to also inform his nephew of this appointment? Kerry Solis is Latif [phone] The nephew came with him to today's visit)

## 2019-12-30 NOTE — Progress Notes (Signed)
Subjective:    Patient ID: Kerry Solis, male    DOB: 03-Jan-1946, 74 y.o.   MRN: [id number]  HPI  Patient arrives to discuss weigh loss and loss of appetite. Patient states he needs some medication for pain- just took infusion for MS and having some difficulties with pain and weakness.   No energy No strength No appeptitie Some n but only occas vomiting Breathing feels short at times Not eating breakfast Some at lunch but not hungry Feeling pain in body and legs No fevers Using BM frequently occas blood when wipes No labs recently Hx MS HIV Review of Systems  Constitutional: Negative for diaphoresis and fatigue.  HENT: Negative for congestion and rhinorrhea.   Respiratory: Negative for cough and shortness of breath.   Cardiovascular: Negative for chest pain and leg swelling.  Gastrointestinal: Negative for abdominal pain and diarrhea.  Skin: Negative for color change and rash.  Neurological: Negative for dizziness and headaches.  Psychiatric/Behavioral: Negative for behavioral problems and confusion.       Objective:   Physical Exam Vitals reviewed.  Constitutional:      General: He is not in acute distress. HENT:     Head: Normocephalic and atraumatic.  Eyes:     General:        Right eye: No discharge.        Left eye: No discharge.  Neck:     Trachea: No tracheal deviation.  Cardiovascular:     Rate and Rhythm: Normal rate and regular rhythm.     Heart sounds: Normal heart sounds. No murmur heard.   Pulmonary:     Effort: Pulmonary effort is normal. No respiratory distress.     Breath sounds: Normal breath sounds.  Lymphadenopathy:     Cervical: No cervical adenopathy.  Skin:    General: Skin is warm and dry.  Neurological:     Mental Status: He is alert.     Coordination: Coordination normal.  Psychiatric:     Comments: Patient depressed stressed           Assessment & Plan:  Major depression-patient admits to feeling depressed finding  himself feeling isolated. He lives by himself. Does not have much in the way of family that checks on him other than his daughter and his son who called on him. They have reached out to him in the past ask him to live with them. So for the reason he has not because he does not want to be a bother. Patient denies being suicidal.  Short-term memory issues cognitive issues will bring back next week for more detailed testing including [place] cognitive assessment I did do a mini assessment today patient can draw clock adequately with short-term memory is impaired  1. Weight loss, unintentional Weight loss I believe it is related into patient's emotional state not wanting to eat I encourage him to do a better job eating and drinking we will check him back in 1 week's time - Comprehensive metabolic panel - CBC with Differential/Platelet - Lipase  2. Other fatigue We will be checking lab work regarding his fatigue and weakness to try to rule out anemia or kidney dysfunction or dehydration - POCT hemoglobin - Comprehensive metabolic panel - CBC with Differential/Platelet - Lipase  3. Weakness See above - Comprehensive metabolic panel - CBC with Differential/Platelet - Lipase  4. Controlled type 2 diabetes mellitus with other specified complication, without long-term current use of insulin ([place]) I doubt that it is the diabetes  causing this issue  5. History of prostate cancer I doubt the history of prostate cancer is causing this issue  6. DOE (dyspnea on exertion) I believe his stress is manifesting itself with poor eating when he got emotionally upset and started breathing very heavily and hiccups but once he settled down his breathing got better  - Comprehensive metabolic panel - CBC with Differential/Platelet - Lipase  7. Anemia due to other cause, not classified Check CBC - POCT hemoglobin - Comprehensive metabolic panel - CBC with Differential/Platelet - Lipase His nephew Kerry Solis  was present. He relates how Kerry Solis has been isolated and depressed recently. I did speak with the patient's daughter I also spoke with the patient in regards to considering living with his daughter he will be thinking about this. His daughter who lives in [place] states she would be more than happy to take him him. Once again we will recheck him in 1 week

## 2019-12-30 NOTE — Telephone Encounter (Signed)
Ultrasound order placed. Left message to return call

## 2019-12-31 NOTE — Telephone Encounter (Signed)
Left message to return call 

## 2020-01-01 ENCOUNTER — Telehealth: Payer: Self-pay | Admitting: Family Medicine

## 2020-01-01 DIAGNOSIS — Z20822 Contact with and (suspected) exposure to covid-19: Secondary | ICD-10-CM | POA: Diagnosis not present

## 2020-01-01 DIAGNOSIS — R5383 Other fatigue: Secondary | ICD-10-CM | POA: Diagnosis not present

## 2020-01-01 DIAGNOSIS — R634 Abnormal weight loss: Secondary | ICD-10-CM | POA: Diagnosis not present

## 2020-01-01 DIAGNOSIS — B20 Human immunodeficiency virus [HIV] disease: Secondary | ICD-10-CM | POA: Diagnosis not present

## 2020-01-01 DIAGNOSIS — R531 Weakness: Secondary | ICD-10-CM | POA: Diagnosis not present

## 2020-01-01 DIAGNOSIS — R410 Disorientation, unspecified: Secondary | ICD-10-CM | POA: Diagnosis not present

## 2020-01-01 DIAGNOSIS — G35 Multiple sclerosis: Secondary | ICD-10-CM | POA: Diagnosis not present

## 2020-01-01 DIAGNOSIS — R0602 Shortness of breath: Secondary | ICD-10-CM | POA: Diagnosis not present

## 2020-01-01 DIAGNOSIS — R9389 Abnormal findings on diagnostic imaging of other specified body structures: Secondary | ICD-10-CM | POA: Diagnosis not present

## 2020-01-01 DIAGNOSIS — R9431 Abnormal electrocardiogram [ECG] [EKG]: Secondary | ICD-10-CM | POA: Diagnosis not present

## 2020-01-01 DIAGNOSIS — R066 Hiccough: Secondary | ICD-10-CM | POA: Diagnosis not present

## 2020-01-01 DIAGNOSIS — E43 Unspecified severe protein-calorie malnutrition: Secondary | ICD-10-CM | POA: Diagnosis not present

## 2020-01-01 DIAGNOSIS — N3941 Urge incontinence: Secondary | ICD-10-CM | POA: Diagnosis not present

## 2020-01-01 DIAGNOSIS — I440 Atrioventricular block, first degree: Secondary | ICD-10-CM | POA: Diagnosis not present

## 2020-01-01 DIAGNOSIS — E785 Hyperlipidemia, unspecified: Secondary | ICD-10-CM | POA: Diagnosis not present

## 2020-01-01 DIAGNOSIS — J189 Pneumonia, unspecified organism: Secondary | ICD-10-CM | POA: Diagnosis not present

## 2020-01-01 DIAGNOSIS — I10 Essential (primary) hypertension: Secondary | ICD-10-CM | POA: Diagnosis not present

## 2020-01-01 DIAGNOSIS — N179 Acute kidney failure, unspecified: Secondary | ICD-10-CM | POA: Diagnosis not present

## 2020-01-01 NOTE — Telephone Encounter (Signed)
error 

## 2020-01-01 NOTE — Telephone Encounter (Signed)
Please let the daughter know that I 100% agree with getting him checked out so they can figure out what is going on  When he is released from the hospital we can provide follow-up visit-either the patient or a family member can call us to set up a follow-up from the hospital within a few days of being discharged thank you  If there is anything else that we can do please have daughter let us know.  Thank you

## 2020-01-01 NOTE — Telephone Encounter (Signed)
Patient currently still being evaluated in the ER

## 2020-01-01 NOTE — Telephone Encounter (Signed)
Discussed with pt's daughter and she verbalized understanding.  

## 2020-01-01 NOTE — Telephone Encounter (Signed)
Daughter contacted office. Family member is taking pt to [facility] at this time. Pt has been having BMs on self. Cant breath, gasping for air, eyes become big and it looks like the wind has been knocked out of him- per family. Pt not eating or drinking. Pt can barely walk. Family was calling to update provider.  Call back number [phone]

## 2020-01-02 DIAGNOSIS — J90 Pleural effusion, not elsewhere classified: Secondary | ICD-10-CM | POA: Diagnosis not present

## 2020-01-02 DIAGNOSIS — R9389 Abnormal findings on diagnostic imaging of other specified body structures: Secondary | ICD-10-CM | POA: Diagnosis not present

## 2020-01-02 DIAGNOSIS — E43 Unspecified severe protein-calorie malnutrition: Secondary | ICD-10-CM | POA: Diagnosis present

## 2020-01-02 DIAGNOSIS — B20 Human immunodeficiency virus [HIV] disease: Secondary | ICD-10-CM | POA: Diagnosis not present

## 2020-01-02 DIAGNOSIS — J810 Acute pulmonary edema: Secondary | ICD-10-CM | POA: Diagnosis not present

## 2020-01-02 DIAGNOSIS — E785 Hyperlipidemia, unspecified: Secondary | ICD-10-CM | POA: Diagnosis present

## 2020-01-02 DIAGNOSIS — M109 Gout, unspecified: Secondary | ICD-10-CM | POA: Diagnosis present

## 2020-01-02 DIAGNOSIS — K760 Fatty (change of) liver, not elsewhere classified: Secondary | ICD-10-CM | POA: Diagnosis present

## 2020-01-02 DIAGNOSIS — J189 Pneumonia, unspecified organism: Secondary | ICD-10-CM | POA: Diagnosis present

## 2020-01-02 DIAGNOSIS — I440 Atrioventricular block, first degree: Secondary | ICD-10-CM | POA: Diagnosis not present

## 2020-01-02 DIAGNOSIS — Z20822 Contact with and (suspected) exposure to covid-19: Secondary | ICD-10-CM | POA: Diagnosis present

## 2020-01-02 DIAGNOSIS — R634 Abnormal weight loss: Secondary | ICD-10-CM | POA: Diagnosis not present

## 2020-01-02 DIAGNOSIS — I10 Essential (primary) hypertension: Secondary | ICD-10-CM | POA: Diagnosis present

## 2020-01-02 DIAGNOSIS — G35 Multiple sclerosis: Secondary | ICD-10-CM | POA: Diagnosis present

## 2020-01-02 DIAGNOSIS — Z7982 Long term (current) use of aspirin: Secondary | ICD-10-CM | POA: Diagnosis not present

## 2020-01-02 DIAGNOSIS — R633 Feeding difficulties: Secondary | ICD-10-CM | POA: Diagnosis not present

## 2020-01-02 DIAGNOSIS — Z83511 Family history of glaucoma: Secondary | ICD-10-CM | POA: Diagnosis not present

## 2020-01-02 DIAGNOSIS — R066 Hiccough: Secondary | ICD-10-CM | POA: Diagnosis not present

## 2020-01-02 DIAGNOSIS — N200 Calculus of kidney: Secondary | ICD-10-CM | POA: Diagnosis not present

## 2020-01-02 DIAGNOSIS — Z833 Family history of diabetes mellitus: Secondary | ICD-10-CM | POA: Diagnosis not present

## 2020-01-02 DIAGNOSIS — R5383 Other fatigue: Secondary | ICD-10-CM | POA: Diagnosis not present

## 2020-01-02 DIAGNOSIS — R918 Other nonspecific abnormal finding of lung field: Secondary | ICD-10-CM | POA: Diagnosis not present

## 2020-01-02 DIAGNOSIS — Z79899 Other long term (current) drug therapy: Secondary | ICD-10-CM | POA: Diagnosis not present

## 2020-01-02 DIAGNOSIS — Z8546 Personal history of malignant neoplasm of prostate: Secondary | ICD-10-CM | POA: Diagnosis not present

## 2020-01-02 DIAGNOSIS — F419 Anxiety disorder, unspecified: Secondary | ICD-10-CM | POA: Diagnosis present

## 2020-01-02 DIAGNOSIS — R1312 Dysphagia, oropharyngeal phase: Secondary | ICD-10-CM | POA: Diagnosis not present

## 2020-01-02 DIAGNOSIS — Z21 Asymptomatic human immunodeficiency virus [HIV] infection status: Secondary | ICD-10-CM | POA: Diagnosis present

## 2020-01-02 DIAGNOSIS — R0602 Shortness of breath: Secondary | ICD-10-CM | POA: Diagnosis not present

## 2020-01-02 DIAGNOSIS — N179 Acute kidney failure, unspecified: Secondary | ICD-10-CM | POA: Diagnosis not present

## 2020-01-02 DIAGNOSIS — N3941 Urge incontinence: Secondary | ICD-10-CM | POA: Diagnosis present

## 2020-01-05 NOTE — Telephone Encounter (Signed)
Left message to return call 

## 2020-01-06 ENCOUNTER — Encounter: Payer: Self-pay | Admitting: Family Medicine

## 2020-01-07 ENCOUNTER — Telehealth: Payer: Self-pay | Admitting: Family Medicine

## 2020-01-07 ENCOUNTER — Other Ambulatory Visit: Payer: Self-pay | Admitting: Family Medicine

## 2020-01-07 DIAGNOSIS — N179 Acute kidney failure, unspecified: Secondary | ICD-10-CM | POA: Diagnosis not present

## 2020-01-07 DIAGNOSIS — Z21 Asymptomatic human immunodeficiency virus [HIV] infection status: Secondary | ICD-10-CM | POA: Diagnosis present

## 2020-01-07 DIAGNOSIS — B20 Human immunodeficiency virus [HIV] disease: Secondary | ICD-10-CM | POA: Diagnosis not present

## 2020-01-07 DIAGNOSIS — E785 Hyperlipidemia, unspecified: Secondary | ICD-10-CM | POA: Diagnosis present

## 2020-01-07 DIAGNOSIS — R5381 Other malaise: Secondary | ICD-10-CM | POA: Insufficient documentation

## 2020-01-07 DIAGNOSIS — R5383 Other fatigue: Secondary | ICD-10-CM | POA: Diagnosis not present

## 2020-01-07 DIAGNOSIS — F418 Other specified anxiety disorders: Secondary | ICD-10-CM | POA: Diagnosis not present

## 2020-01-07 DIAGNOSIS — H409 Unspecified glaucoma: Secondary | ICD-10-CM | POA: Diagnosis present

## 2020-01-07 DIAGNOSIS — I10 Essential (primary) hypertension: Secondary | ICD-10-CM | POA: Diagnosis present

## 2020-01-07 DIAGNOSIS — R066 Hiccough: Secondary | ICD-10-CM | POA: Diagnosis present

## 2020-01-07 DIAGNOSIS — J189 Pneumonia, unspecified organism: Secondary | ICD-10-CM | POA: Diagnosis present

## 2020-01-07 DIAGNOSIS — G35 Multiple sclerosis: Secondary | ICD-10-CM | POA: Diagnosis present

## 2020-01-07 DIAGNOSIS — Z741 Need for assistance with personal care: Secondary | ICD-10-CM | POA: Diagnosis present

## 2020-01-07 DIAGNOSIS — Z7409 Other reduced mobility: Secondary | ICD-10-CM | POA: Diagnosis present

## 2020-01-07 NOTE — Telephone Encounter (Signed)
Tried to call no answer

## 2020-01-07 NOTE — Telephone Encounter (Signed)
Nurses Patient was admitted into the hospital due to pneumonia Currently in rehab When he gets out he can follow-up as per previous message

## 2020-01-07 NOTE — Telephone Encounter (Signed)
Patient currently admitted in hospital. 

## 2020-01-07 NOTE — Telephone Encounter (Signed)
So please let family know that we are sorry to hear that he is in the hospital when he is released to follow-up with [place].  Patient currently has appointment August 5 at 10 AM but we can see him sooner if need be

## 2020-01-07 NOTE — Telephone Encounter (Signed)
Await discharge

## 2020-01-12 ENCOUNTER — Telehealth: Payer: Self-pay | Admitting: Family Medicine

## 2020-01-12 NOTE — Telephone Encounter (Signed)
Pt is currently in [facility] and daughter is calling to give update to [name].   [name] told her to call and give update.

## 2020-01-12 NOTE — Telephone Encounter (Signed)
Per another phone message:   Pt is currently in [facility] and daughter is calling to give update to [name].    [name] told her to call and give update.   Message was sent to [name].

## 2020-01-14 ENCOUNTER — Ambulatory Visit (HOSPITAL_COMMUNITY): Admission: RE | Admit: 2020-01-14 | Payer: Medicare Other | Source: Ambulatory Visit

## 2020-01-14 DIAGNOSIS — Z7409 Other reduced mobility: Secondary | ICD-10-CM | POA: Insufficient documentation

## 2020-01-14 DIAGNOSIS — R413 Other amnesia: Secondary | ICD-10-CM | POA: Insufficient documentation

## 2020-01-14 DIAGNOSIS — Z789 Other specified health status: Secondary | ICD-10-CM | POA: Insufficient documentation

## 2020-01-14 DIAGNOSIS — F419 Anxiety disorder, unspecified: Secondary | ICD-10-CM | POA: Insufficient documentation

## 2020-01-14 NOTE — Telephone Encounter (Signed)
I did have a conversation with the family Patient may be going home from the hospital in the end of this week we will do a follow-up visit next week family will call for appointment

## 2020-01-19 NOTE — Telephone Encounter (Signed)
Patient has hospital follow up 01/20/2020 with Dr Nicki Reaper

## 2020-01-20 ENCOUNTER — Encounter: Payer: Self-pay | Admitting: Family Medicine

## 2020-01-20 ENCOUNTER — Other Ambulatory Visit: Payer: Self-pay

## 2020-01-20 ENCOUNTER — Ambulatory Visit (INDEPENDENT_AMBULATORY_CARE_PROVIDER_SITE_OTHER): Payer: Medicare Other | Admitting: Family Medicine

## 2020-01-20 VITALS — BP 134/88 | HR 97 | Temp 97.7°F | Wt 183.8 lb

## 2020-01-20 DIAGNOSIS — J189 Pneumonia, unspecified organism: Secondary | ICD-10-CM | POA: Diagnosis not present

## 2020-01-20 NOTE — Progress Notes (Signed)
Subjective:    Patient ID: Kerry Solis, male    DOB: 1945-08-14, 74 y.o.   MRN: [id number]  HPI  I had spoken with the daughter when the patient was in the rehab center we talked about following up this week we also talked about how he really cannot take care of himself currently  Patient comes in today for a hospital follow up. Patient reports lasting fatigue and balance issues but this seems to be improving.  Exceptionally nice gentleman Has underlying MS and HIV Recently in the hospital for what appeared to be pneumonia.  Had extensive treatments with antibiotics still had significant fatigue weakness and had to go into rehab for several days now he is released and here for follow-up with his daughter Overall he relates that he is starting to feel better He has a difficult time moving about where he lives.  His daughter would like for him to go with her to [place] to live at least for now if not permanently.  The patient is somewhat hesitant about doing this.  Review of Systems  Constitutional: Negative for activity change.  HENT: Negative for congestion and rhinorrhea.   Respiratory: Negative for cough and shortness of breath.   Cardiovascular: Negative for chest pain.  Gastrointestinal: Negative for abdominal pain, diarrhea, nausea and vomiting.  Genitourinary: Negative for dysuria and hematuria.  Neurological: Positive for weakness. Negative for headaches.  Psychiatric/Behavioral: Negative for behavioral problems and confusion.       Objective:   Physical Exam Vitals reviewed.  Constitutional:      General: He is not in acute distress. HENT:     Head: Normocephalic and atraumatic.  Eyes:     General:        Right eye: No discharge.        Left eye: No discharge.  Neck:     Trachea: No tracheal deviation.  Cardiovascular:     Rate and Rhythm: Normal rate and regular rhythm.     Heart sounds: Normal heart sounds. No murmur heard.   Pulmonary:     Effort:  Pulmonary effort is normal. No respiratory distress.     Breath sounds: Normal breath sounds.  Lymphadenopathy:     Cervical: No cervical adenopathy.  Skin:    General: Skin is warm and dry.  Neurological:     Mental Status: He is alert.     Coordination: Coordination normal.  Psychiatric:        Behavior: Behavior normal.     His lungs are clear Fall Risk  10/06/2019 09/09/2019 03/19/2019 05/08/2018 12/21/2016  Falls in the past year? 1 1 0 1 No  Comment - - - Emmi Telephone Survey: data to providers prior to load -  Number falls in past yr: 1 1 - 1 -  Comment - - - Emmi Telephone Survey Actual Response = 3 -  Injury with Fall? 0 1 - 0 -  Comment - knot on face from the fall that happened in december - - -  Risk Factor Category  - - - - -  Risk for fall due to : Impaired balance/gait;Impaired mobility Impaired mobility - - -  Risk for fall due to: Comment - walks with a cane - - -  Follow up Falls evaluation completed;Education provided Education provided Falls evaluation completed - -        Assessment & Plan:  Recent pneumonia-do follow-up x-ray next week that will be 2 weeks out from the initial pneumonia No  need for further antibiotics  Depression partly related to the fact he is just not able to do his much as he used to but it is felt that this is also related into advancing health issues, loneliness, inability to take care of himself as well  Hyperlipidemia continue the pravastatin on a regular basis  Difficult home situation-it appears that he may have a nephew willing to live with him for a few weeks or so but if he moves to his daughter's house he will have his own room plus she will be able to look after him  I did do a [place] cognitive assessment for which he failed.  He scored a 17 out of 25 has cognitive decline probably associated with multiple health issues.  May also be early dementia.  Very important for this patient to stay with his daughter so that he will  have adequate care.  If he does stay in [place] he can transfer his care to [facility] or internist as well as specialist associated with [facility] or other large medical facility  We are certainly available to the patient at any point time or his daughter regarding health issues We can assist with transferring medical records once he is established

## 2020-01-21 ENCOUNTER — Inpatient Hospital Stay: Payer: Medicare Other | Admitting: Family Medicine

## 2020-01-25 ENCOUNTER — Ambulatory Visit (HOSPITAL_COMMUNITY)
Admission: RE | Admit: 2020-01-25 | Discharge: 2020-01-25 | Disposition: A | Discharge status: Home / Self Care | Payer: Medicare Other | Source: Ambulatory Visit | Attending: Family Medicine | Admitting: Family Medicine

## 2020-01-25 ENCOUNTER — Other Ambulatory Visit: Payer: Self-pay

## 2020-01-25 DIAGNOSIS — J189 Pneumonia, unspecified organism: Secondary | ICD-10-CM | POA: Diagnosis not present

## 2020-01-25 DIAGNOSIS — J984 Other disorders of lung: Secondary | ICD-10-CM | POA: Diagnosis not present

## 2020-01-25 NOTE — Addendum Note (Signed)
Addended by: Dairl Ponder on: 01/25/2020 11:24 AM   Modules accepted: Orders

## 2020-01-26 ENCOUNTER — Other Ambulatory Visit: Payer: Self-pay | Admitting: Family Medicine

## 2020-01-26 DIAGNOSIS — J189 Pneumonia, unspecified organism: Secondary | ICD-10-CM

## 2020-01-26 MED ORDER — CEFDINIR 300 MG PO CAPS
ORAL_CAPSULE | ORAL | 0 refills | Status: DC
Start: 2020-01-26 — End: 2020-03-14

## 2020-01-29 ENCOUNTER — Telehealth: Payer: Self-pay | Admitting: Family Medicine

## 2020-01-29 NOTE — Telephone Encounter (Signed)
Clinical history pneumonia Indication follow-up x-ray To be done in 3 weeks If the center will take a order from [place] that would be great then have the result faxed to [place] (To some degree I do not know if the diagnostic center/they will allow [place] to order something in [place] but we can at least try)

## 2020-01-29 NOTE — Telephone Encounter (Signed)
Please advise. Thank you

## 2020-01-29 NOTE — Telephone Encounter (Signed)
Patient daughter Kerry Solis is leaving going to [place] on 8/15 and patient is traveling with her. She is wondering if you can sent orders for his xray of his lung sent to [facility] in [place] to have this done their . Fax number is [phone] .Please advise

## 2020-02-01 NOTE — Telephone Encounter (Signed)
Called and discussed with daughter and cxr order written and waiting for dr scott to sign then will fax.

## 2020-02-01 NOTE — Telephone Encounter (Signed)
Order faxed and then sent to medical records to be scanned

## 2020-02-03 DIAGNOSIS — M6281 Muscle weakness (generalized): Secondary | ICD-10-CM | POA: Diagnosis not present

## 2020-02-03 DIAGNOSIS — R262 Difficulty in walking, not elsewhere classified: Secondary | ICD-10-CM | POA: Diagnosis not present

## 2020-02-03 DIAGNOSIS — R5381 Other malaise: Secondary | ICD-10-CM | POA: Diagnosis not present

## 2020-02-03 DIAGNOSIS — R531 Weakness: Secondary | ICD-10-CM | POA: Diagnosis not present

## 2020-02-03 DIAGNOSIS — G35 Multiple sclerosis: Secondary | ICD-10-CM | POA: Diagnosis not present

## 2020-02-03 DIAGNOSIS — M21371 Foot drop, right foot: Secondary | ICD-10-CM | POA: Diagnosis not present

## 2020-02-08 ENCOUNTER — Telehealth: Payer: Self-pay | Admitting: Family Medicine

## 2020-02-08 NOTE — Telephone Encounter (Signed)
Please advise. Thank you

## 2020-02-08 NOTE — Telephone Encounter (Signed)
Pt is having pain from waist up to neck hard to get out of bed in a lot of pain. Pt wanted me to send a message back to Dr Nicki Reaper. I tried to get him to make appt he said a message would be fine.   Pt call back [phone]

## 2020-02-09 DIAGNOSIS — M21371 Foot drop, right foot: Secondary | ICD-10-CM | POA: Diagnosis not present

## 2020-02-09 DIAGNOSIS — G35 Multiple sclerosis: Secondary | ICD-10-CM | POA: Diagnosis not present

## 2020-02-09 DIAGNOSIS — M6281 Muscle weakness (generalized): Secondary | ICD-10-CM | POA: Diagnosis not present

## 2020-02-09 DIAGNOSIS — R531 Weakness: Secondary | ICD-10-CM | POA: Diagnosis not present

## 2020-02-09 DIAGNOSIS — R5381 Other malaise: Secondary | ICD-10-CM | POA: Diagnosis not present

## 2020-02-09 DIAGNOSIS — R262 Difficulty in walking, not elsewhere classified: Secondary | ICD-10-CM | POA: Diagnosis not present

## 2020-02-09 NOTE — Telephone Encounter (Signed)
Pt is still in [place] and in a lot of pain I explained to the pt that the doctor will respond as quickly as possible.

## 2020-02-09 NOTE — Telephone Encounter (Signed)
Left message to return call 

## 2020-02-09 NOTE — Telephone Encounter (Signed)
I recommend a phone visit, 4:30 PM or tomorrow morning

## 2020-02-10 ENCOUNTER — Other Ambulatory Visit: Payer: Self-pay

## 2020-02-10 ENCOUNTER — Telehealth (INDEPENDENT_AMBULATORY_CARE_PROVIDER_SITE_OTHER): Payer: Medicare Other | Admitting: Family Medicine

## 2020-02-10 DIAGNOSIS — R531 Weakness: Secondary | ICD-10-CM

## 2020-02-10 DIAGNOSIS — R270 Ataxia, unspecified: Secondary | ICD-10-CM | POA: Diagnosis not present

## 2020-02-10 MED ORDER — HYDROCODONE-ACETAMINOPHEN 5-325 MG PO TABS: 1.0000 | ORAL_TABLET | ORAL | 0 refills | Status: DC | PRN

## 2020-02-10 NOTE — Progress Notes (Signed)
Telephone discussion today having difficulties with pain and discomfort and in fact has difficult time walking leg weakness.  Mainly is found to either in the chair or couch or bed.  Currently staying in [place] with his daughter but the bedrooms are upstairs and he cannot go upstairs so therefore they are going to transfer him back to his residence in [place].  The daughter states that he needs help to help him with caretaking.  I tried to explain to the daughter that caretaking is not as easy as just calling a number and having someone fill out.  She states she spoke with the insurance company and they told her that she could get a CNA to be there for many hours a day if we just wrote a prescription. I wish it was that easy We will have our staff look into this Pain medication was sent in Patient will be following up with [place] within the next 2 to 3 weeks  Referral for home health and physical therapy and for home health social service consult to see if they can get a CNA to help out

## 2020-02-10 NOTE — Telephone Encounter (Signed)
Patient scheduled phone visit today with Dr Nicki Reaper

## 2020-02-16 NOTE — Progress Notes (Signed)
Sorry dr Nicki Reaper just seeing this message. Was out of office last Thursday, Friday and Monday afternoon. All the daughter told me was that she called insurance and they said to write a prescription for how many hours pt needs aid. I told daughter that we would put in referral to PT, referral for home health nurse and referral for aid. Referrals were put in.

## 2020-02-18 ENCOUNTER — Telehealth: Payer: Self-pay | Admitting: Family Medicine

## 2020-02-18 NOTE — Telephone Encounter (Signed)
Referral was put in on 8/25. Can maryann or shannon check on referral status

## 2020-02-18 NOTE — Telephone Encounter (Signed)
Scot Dock (daughter) is calling wanting to speak to a nurse about getting a CNA out to her dads home it is not set up and she needs to get this set up.  Call [phone] (daughter)

## 2020-02-20 DIAGNOSIS — E785 Hyperlipidemia, unspecified: Secondary | ICD-10-CM | POA: Diagnosis not present

## 2020-02-20 DIAGNOSIS — R270 Ataxia, unspecified: Secondary | ICD-10-CM | POA: Diagnosis not present

## 2020-02-20 DIAGNOSIS — B20 Human immunodeficiency virus [HIV] disease: Secondary | ICD-10-CM | POA: Diagnosis not present

## 2020-02-20 DIAGNOSIS — Z8701 Personal history of pneumonia (recurrent): Secondary | ICD-10-CM | POA: Diagnosis not present

## 2020-02-20 DIAGNOSIS — F329 Major depressive disorder, single episode, unspecified: Secondary | ICD-10-CM | POA: Diagnosis not present

## 2020-02-20 DIAGNOSIS — G35 Multiple sclerosis: Secondary | ICD-10-CM | POA: Diagnosis not present

## 2020-02-23 ENCOUNTER — Other Ambulatory Visit: Payer: Self-pay | Admitting: Family Medicine

## 2020-02-24 ENCOUNTER — Telehealth: Payer: Self-pay | Admitting: Family Medicine

## 2020-02-24 DIAGNOSIS — F329 Major depressive disorder, single episode, unspecified: Secondary | ICD-10-CM | POA: Diagnosis not present

## 2020-02-24 DIAGNOSIS — G35 Multiple sclerosis: Secondary | ICD-10-CM | POA: Diagnosis not present

## 2020-02-24 DIAGNOSIS — R270 Ataxia, unspecified: Secondary | ICD-10-CM | POA: Diagnosis not present

## 2020-02-24 DIAGNOSIS — Z8701 Personal history of pneumonia (recurrent): Secondary | ICD-10-CM | POA: Diagnosis not present

## 2020-02-24 DIAGNOSIS — B20 Human immunodeficiency virus [HIV] disease: Secondary | ICD-10-CM | POA: Diagnosis not present

## 2020-02-24 DIAGNOSIS — E785 Hyperlipidemia, unspecified: Secondary | ICD-10-CM | POA: Diagnosis not present

## 2020-02-24 NOTE — Telephone Encounter (Signed)
May have refills of allopurinol amlodipine and sertraline, hold on the baclofen Please bring all medications to the follow-up visit later in September

## 2020-02-24 NOTE — Telephone Encounter (Signed)
Kerry Solis requesting refill on Baclofen 10 mg tablet, Sertraline 50 mg tablet, Allopurinol 300 mg tablet and Amlodipine 10 mg tablet. Pt last seen 02/10/20 for Ataxia

## 2020-02-25 ENCOUNTER — Other Ambulatory Visit: Payer: Self-pay | Admitting: *Deleted

## 2020-02-25 ENCOUNTER — Telehealth: Payer: Self-pay | Admitting: Family Medicine

## 2020-02-25 DIAGNOSIS — F329 Major depressive disorder, single episode, unspecified: Secondary | ICD-10-CM | POA: Diagnosis not present

## 2020-02-25 DIAGNOSIS — R270 Ataxia, unspecified: Secondary | ICD-10-CM | POA: Diagnosis not present

## 2020-02-25 DIAGNOSIS — E785 Hyperlipidemia, unspecified: Secondary | ICD-10-CM | POA: Diagnosis not present

## 2020-02-25 DIAGNOSIS — Z8701 Personal history of pneumonia (recurrent): Secondary | ICD-10-CM | POA: Diagnosis not present

## 2020-02-25 DIAGNOSIS — G35 Multiple sclerosis: Secondary | ICD-10-CM | POA: Diagnosis not present

## 2020-02-25 DIAGNOSIS — B20 Human immunodeficiency virus [HIV] disease: Secondary | ICD-10-CM | POA: Diagnosis not present

## 2020-02-25 MED ORDER — ALLOPURINOL 100 MG PO TABS: ORAL_TABLET | ORAL | 0 refills | Status: DC

## 2020-02-25 MED ORDER — AMLODIPINE BESYLATE 5 MG PO TABS
ORAL_TABLET | ORAL | 0 refills | Status: DC
Start: 2020-02-25 — End: 2020-04-20

## 2020-02-25 MED ORDER — SERTRALINE HCL 100 MG PO TABS
100.0000 mg | ORAL_TABLET | Freq: Every day | ORAL | 0 refills | Status: DC
Start: 2020-02-25 — End: 2020-04-20

## 2020-02-25 NOTE — Telephone Encounter (Signed)
Refills sent and called pharm and talked with pharm to hold on baclofen.

## 2020-02-25 NOTE — Telephone Encounter (Signed)
Please go ahead and give

## 2020-02-25 NOTE — Telephone Encounter (Signed)
Hoyle Sauer (PT with [facility]) calling to get verbal orders for PT. They would like to see patient 2 times a week for 2 weeks and then once a week for 4 weeks to work on strength. Please advise. Thank you  Hoyle Sauer [phone]

## 2020-02-29 ENCOUNTER — Telehealth: Payer: Self-pay | Admitting: Family Medicine

## 2020-02-29 DIAGNOSIS — Z8701 Personal history of pneumonia (recurrent): Secondary | ICD-10-CM | POA: Diagnosis not present

## 2020-02-29 DIAGNOSIS — F329 Major depressive disorder, single episode, unspecified: Secondary | ICD-10-CM | POA: Diagnosis not present

## 2020-02-29 DIAGNOSIS — R270 Ataxia, unspecified: Secondary | ICD-10-CM | POA: Diagnosis not present

## 2020-02-29 DIAGNOSIS — E785 Hyperlipidemia, unspecified: Secondary | ICD-10-CM | POA: Diagnosis not present

## 2020-02-29 DIAGNOSIS — G35 Multiple sclerosis: Secondary | ICD-10-CM | POA: Diagnosis not present

## 2020-02-29 DIAGNOSIS — B20 Human immunodeficiency virus [HIV] disease: Secondary | ICD-10-CM | POA: Diagnosis not present

## 2020-02-29 NOTE — Telephone Encounter (Signed)
Discussed with pt's daughter and number given to counsil on aging and number given to [place] services

## 2020-02-29 NOTE — Telephone Encounter (Signed)
So I do not have any special avenues to help with this situation More than likely family will have to hire aide to help (Unfortunately we do not have a Theatre stage manager who could help figure these type of things out) Family could try connecting with social services within the county that he lives Also with the disability aging and transport services (used to be Council on aging) possibly there is 1 of these in [place]? Often these groups have a listing of agencies where families could hire someone

## 2020-02-29 NOTE — Telephone Encounter (Signed)
Pt's daughter called to say that [facility] doesn't have CNA's to help with PCS  Daughter requesting a CNA to come help with bathing, light house work, meals  Please advise  (explained to daughter that it might be something that insurance does not cover)

## 2020-03-01 ENCOUNTER — Telehealth: Payer: Self-pay | Admitting: *Deleted

## 2020-03-01 DIAGNOSIS — B20 Human immunodeficiency virus [HIV] disease: Secondary | ICD-10-CM | POA: Diagnosis not present

## 2020-03-01 DIAGNOSIS — G35 Multiple sclerosis: Secondary | ICD-10-CM | POA: Diagnosis not present

## 2020-03-01 DIAGNOSIS — E785 Hyperlipidemia, unspecified: Secondary | ICD-10-CM | POA: Diagnosis not present

## 2020-03-01 DIAGNOSIS — R270 Ataxia, unspecified: Secondary | ICD-10-CM | POA: Diagnosis not present

## 2020-03-01 DIAGNOSIS — Z8701 Personal history of pneumonia (recurrent): Secondary | ICD-10-CM | POA: Diagnosis not present

## 2020-03-01 DIAGNOSIS — F329 Major depressive disorder, single episode, unspecified: Secondary | ICD-10-CM | POA: Diagnosis not present

## 2020-03-01 NOTE — Telephone Encounter (Signed)
Charlene Education officer, museum from [facility] called to report they went out to see pt and he was not there. She wanted verbal order to go out and try and see him again. Verbal order given.

## 2020-03-03 DIAGNOSIS — G35 Multiple sclerosis: Secondary | ICD-10-CM | POA: Diagnosis not present

## 2020-03-03 DIAGNOSIS — B20 Human immunodeficiency virus [HIV] disease: Secondary | ICD-10-CM | POA: Diagnosis not present

## 2020-03-03 DIAGNOSIS — F329 Major depressive disorder, single episode, unspecified: Secondary | ICD-10-CM | POA: Diagnosis not present

## 2020-03-03 DIAGNOSIS — R270 Ataxia, unspecified: Secondary | ICD-10-CM | POA: Diagnosis not present

## 2020-03-03 DIAGNOSIS — E785 Hyperlipidemia, unspecified: Secondary | ICD-10-CM | POA: Diagnosis not present

## 2020-03-03 DIAGNOSIS — Z8701 Personal history of pneumonia (recurrent): Secondary | ICD-10-CM | POA: Diagnosis not present

## 2020-03-04 DIAGNOSIS — B20 Human immunodeficiency virus [HIV] disease: Secondary | ICD-10-CM | POA: Diagnosis not present

## 2020-03-04 DIAGNOSIS — R270 Ataxia, unspecified: Secondary | ICD-10-CM | POA: Diagnosis not present

## 2020-03-04 DIAGNOSIS — G35 Multiple sclerosis: Secondary | ICD-10-CM | POA: Diagnosis not present

## 2020-03-08 ENCOUNTER — Ambulatory Visit: Payer: Medicare Other | Admitting: Family Medicine

## 2020-03-08 DIAGNOSIS — B20 Human immunodeficiency virus [HIV] disease: Secondary | ICD-10-CM | POA: Diagnosis not present

## 2020-03-08 DIAGNOSIS — F329 Major depressive disorder, single episode, unspecified: Secondary | ICD-10-CM | POA: Diagnosis not present

## 2020-03-08 DIAGNOSIS — R270 Ataxia, unspecified: Secondary | ICD-10-CM | POA: Diagnosis not present

## 2020-03-08 DIAGNOSIS — E785 Hyperlipidemia, unspecified: Secondary | ICD-10-CM | POA: Diagnosis not present

## 2020-03-08 DIAGNOSIS — G35 Multiple sclerosis: Secondary | ICD-10-CM | POA: Diagnosis not present

## 2020-03-08 DIAGNOSIS — Z8701 Personal history of pneumonia (recurrent): Secondary | ICD-10-CM | POA: Diagnosis not present

## 2020-03-10 DIAGNOSIS — E785 Hyperlipidemia, unspecified: Secondary | ICD-10-CM | POA: Diagnosis not present

## 2020-03-10 DIAGNOSIS — G35 Multiple sclerosis: Secondary | ICD-10-CM | POA: Diagnosis not present

## 2020-03-10 DIAGNOSIS — B20 Human immunodeficiency virus [HIV] disease: Secondary | ICD-10-CM | POA: Diagnosis not present

## 2020-03-10 DIAGNOSIS — Z8701 Personal history of pneumonia (recurrent): Secondary | ICD-10-CM | POA: Diagnosis not present

## 2020-03-10 DIAGNOSIS — F329 Major depressive disorder, single episode, unspecified: Secondary | ICD-10-CM | POA: Diagnosis not present

## 2020-03-10 DIAGNOSIS — R270 Ataxia, unspecified: Secondary | ICD-10-CM | POA: Diagnosis not present

## 2020-03-14 ENCOUNTER — Ambulatory Visit (INDEPENDENT_AMBULATORY_CARE_PROVIDER_SITE_OTHER): Payer: Medicare Other | Admitting: Family Medicine

## 2020-03-14 ENCOUNTER — Other Ambulatory Visit: Payer: Self-pay

## 2020-03-14 ENCOUNTER — Encounter: Payer: Self-pay | Admitting: Family Medicine

## 2020-03-14 ENCOUNTER — Ambulatory Visit (HOSPITAL_COMMUNITY)
Admission: RE | Admit: 2020-03-14 | Discharge: 2020-03-14 | Disposition: A | Discharge status: Home / Self Care | Payer: Medicare Other | Source: Ambulatory Visit | Attending: Family Medicine | Admitting: Family Medicine

## 2020-03-14 VITALS — BP 138/86 | HR 98 | Temp 98.1°F | Wt 201.6 lb

## 2020-03-14 DIAGNOSIS — B20 Human immunodeficiency virus [HIV] disease: Secondary | ICD-10-CM | POA: Diagnosis not present

## 2020-03-14 DIAGNOSIS — I10 Essential (primary) hypertension: Secondary | ICD-10-CM | POA: Diagnosis not present

## 2020-03-14 DIAGNOSIS — R7401 Elevation of levels of liver transaminase levels: Secondary | ICD-10-CM | POA: Diagnosis not present

## 2020-03-14 DIAGNOSIS — N289 Disorder of kidney and ureter, unspecified: Secondary | ICD-10-CM | POA: Diagnosis not present

## 2020-03-14 DIAGNOSIS — Z8701 Personal history of pneumonia (recurrent): Secondary | ICD-10-CM

## 2020-03-14 DIAGNOSIS — Z23 Encounter for immunization: Secondary | ICD-10-CM

## 2020-03-14 DIAGNOSIS — J189 Pneumonia, unspecified organism: Secondary | ICD-10-CM | POA: Diagnosis not present

## 2020-03-14 DIAGNOSIS — R270 Ataxia, unspecified: Secondary | ICD-10-CM | POA: Diagnosis not present

## 2020-03-14 DIAGNOSIS — G35 Multiple sclerosis: Secondary | ICD-10-CM | POA: Diagnosis not present

## 2020-03-14 DIAGNOSIS — G35D Multiple sclerosis, unspecified: Secondary | ICD-10-CM

## 2020-03-14 DIAGNOSIS — E785 Hyperlipidemia, unspecified: Secondary | ICD-10-CM | POA: Diagnosis not present

## 2020-03-14 DIAGNOSIS — F329 Major depressive disorder, single episode, unspecified: Secondary | ICD-10-CM | POA: Diagnosis not present

## 2020-03-14 DIAGNOSIS — E1169 Type 2 diabetes mellitus with other specified complication: Secondary | ICD-10-CM | POA: Diagnosis not present

## 2020-03-14 NOTE — Progress Notes (Signed)
Subjective:    Patient ID: Kerry Solis, male    DOB: [birth year], 74 y.o.   MRN: [id number]  HPI Patient reports contact pain all over and fatigue. 2 toes on right foot numb. Stomach issues and constantly eating.  Patient feels fatigued a lot.  Low energy.  He depends on others for his food He states he will be doing Meals on Wheels coming up Lives by himself his niece checks in with him once or twice a week Family phones him every day Patient has been advised not to drive Patient with some cognitive issues that are worsening He understands that more than likely he will not be able to live by himself long-term  Review of Systems  Constitutional: Negative for activity change.  HENT: Negative for congestion and rhinorrhea.   Respiratory: Negative for cough and shortness of breath.   Cardiovascular: Negative for chest pain.  Gastrointestinal: Negative for abdominal pain, diarrhea, nausea and vomiting.  Genitourinary: Negative for dysuria and hematuria.  Neurological: Negative for weakness and headaches.  Psychiatric/Behavioral: Negative for behavioral problems and confusion.       Objective:   Physical Exam Vitals reviewed.  Constitutional:      General: He is not in acute distress. HENT:     Head: Normocephalic and atraumatic.  Eyes:     General:        Right eye: No discharge.        Left eye: No discharge.  Neck:     Trachea: No tracheal deviation.  Cardiovascular:     Rate and Rhythm: Normal rate and regular rhythm.     Heart sounds: Normal heart sounds. No murmur heard.   Pulmonary:     Effort: Pulmonary effort is normal. No respiratory distress.     Breath sounds: Normal breath sounds.  Lymphadenopathy:     Cervical: No cervical adenopathy.  Skin:    General: Skin is warm and dry.  Neurological:     Mental Status: He is alert.     Coordination: Coordination normal.  Psychiatric:        Behavior: Behavior normal.    Fall Risk  10/06/2019 09/09/2019  03/19/2019 05/08/2018 12/21/2016  Falls in the past year? 1 1 0 1 No  Comment - - - Emmi Telephone Survey: data to providers prior to load -  Number falls in past yr: 1 1 - 1 -  Comment - - - Emmi Telephone Survey Actual Response = 3 -  Injury with Fall? 0 1 - 0 -  Comment - knot on face from the fall that happened in december - - -  Risk Factor Category  - - - - -  Risk for fall due to : Impaired balance/gait;Impaired mobility Impaired mobility - - -  Risk for fall due to: Comment - walks with a cane - - -  Follow up Falls evaluation completed;Education provided Education provided Falls evaluation completed - -          Assessment & Plan:  1. Essential hypertension Blood pressure with some elevation continue medication watch diet check labs - CBC with Differential/Platelet - Comprehensive Metabolic Panel (CMET)  2. Controlled type 2 diabetes mellitus with other specified complication, without long-term current use of insulin ([place]) Diabetes reportedly under good control, controlled by diet  3. Multiple sclerosis ([place]) See specialist on a regular basis  4. Renal insufficiency We will look at kidney function.  5. Elevated transaminase level We will look good liver function.  6.  History of bacterial pneumonia Chest x-ray has follow-up from previous x-ray back in August - DG Chest 2 View - CBC with Differential/Platelet - Comprehensive Metabolic Panel (CMET)  7. Need for vaccination Flu vaccine - Flu Vaccine QUAD High Dose(Fluad) Patient has agreed to follow-up in 4 weeks to recheck to see how he is doing In the long run I think he is going to have to move to [facility] to stay with his daughter Kerry Solis that he will be able to do well by himself up here Patient seemingly understanding this Depression is doing better He relates he is taking his medicines Patient's fatigue tiredness I think related to age obesity lack of physical activity await lab work

## 2020-03-15 ENCOUNTER — Encounter: Payer: Self-pay | Admitting: Family Medicine

## 2020-03-15 DIAGNOSIS — F329 Major depressive disorder, single episode, unspecified: Secondary | ICD-10-CM | POA: Diagnosis not present

## 2020-03-15 DIAGNOSIS — B20 Human immunodeficiency virus [HIV] disease: Secondary | ICD-10-CM | POA: Diagnosis not present

## 2020-03-15 DIAGNOSIS — E785 Hyperlipidemia, unspecified: Secondary | ICD-10-CM | POA: Diagnosis not present

## 2020-03-15 DIAGNOSIS — G35 Multiple sclerosis: Secondary | ICD-10-CM | POA: Diagnosis not present

## 2020-03-15 DIAGNOSIS — R270 Ataxia, unspecified: Secondary | ICD-10-CM | POA: Diagnosis not present

## 2020-03-15 DIAGNOSIS — Z8701 Personal history of pneumonia (recurrent): Secondary | ICD-10-CM | POA: Diagnosis not present

## 2020-03-15 LAB — CBC WITH DIFFERENTIAL/PLATELET
Basophils Absolute: 0 10*3/uL (ref 0.0–0.2)
Basos: 1 %
EOS (ABSOLUTE): 0.1 10*3/uL (ref 0.0–0.4)
Eos: 2 %
Hematocrit: 43.1 % (ref 37.5–51.0)
Hemoglobin: 14 g/dL (ref 13.0–17.7)
Immature Grans (Abs): 0 10*3/uL (ref 0.0–0.1)
Immature Granulocytes: 0 %
Lymphocytes Absolute: 1.8 10*3/uL (ref 0.7–3.1)
Lymphs: 44 %
MCH: 28.8 pg (ref 26.6–33.0)
MCHC: 32.5 g/dL (ref 31.5–35.7)
MCV: 89 fL (ref 79–97)
Monocytes Absolute: 0.5 10*3/uL (ref 0.1–0.9)
Monocytes: 14 %
Neutrophils Absolute: 1.6 10*3/uL (ref 1.4–7.0)
Neutrophils: 39 %
Platelets: 241 10*3/uL (ref 150–450)
RBC: 4.86 x10E6/uL (ref 4.14–5.80)
RDW: 14 % (ref 11.6–15.4)
WBC: 4 10*3/uL (ref 3.4–10.8)

## 2020-03-15 LAB — COMPREHENSIVE METABOLIC PANEL
ALT: 18 IU/L (ref 0–44)
AST: 25 IU/L (ref 0–40)
Albumin/Globulin Ratio: 1.7 (ref 1.2–2.2)
Albumin: 4.8 g/dL — ABNORMAL HIGH (ref 3.7–4.7)
Alkaline Phosphatase: 71 IU/L (ref 44–121)
BUN/Creatinine Ratio: 14 (ref 10–24)
BUN: 16 mg/dL (ref 8–27)
Bilirubin Total: 0.2 mg/dL (ref 0.0–1.2)
CO2: 22 mmol/L (ref 20–29)
Calcium: 9.8 mg/dL (ref 8.6–10.2)
Chloride: 106 mmol/L (ref 96–106)
Creatinine, Ser: 1.14 mg/dL (ref 0.76–1.27)
GFR calc Af Amer: 72 mL/min/{1.73_m2} (ref >59)
GFR calc non Af Amer: 62 mL/min/{1.73_m2} (ref >59)
Globulin, Total: 2.8 g/dL (ref 1.5–4.5)
Glucose: 98 mg/dL (ref 65–99)
Potassium: 4.7 mmol/L (ref 3.5–5.2)
Sodium: 144 mmol/L (ref 134–144)
Total Protein: 7.6 g/dL (ref 6.0–8.5)

## 2020-03-16 DIAGNOSIS — B20 Human immunodeficiency virus [HIV] disease: Secondary | ICD-10-CM | POA: Diagnosis not present

## 2020-03-16 DIAGNOSIS — Z21 Asymptomatic human immunodeficiency virus [HIV] infection status: Secondary | ICD-10-CM | POA: Diagnosis not present

## 2020-03-16 DIAGNOSIS — Z79899 Other long term (current) drug therapy: Secondary | ICD-10-CM | POA: Diagnosis not present

## 2020-03-16 DIAGNOSIS — Z792 Long term (current) use of antibiotics: Secondary | ICD-10-CM | POA: Diagnosis not present

## 2020-03-16 DIAGNOSIS — M1A0710 Idiopathic chronic gout, right ankle and foot, without tophus (tophi): Secondary | ICD-10-CM | POA: Diagnosis not present

## 2020-03-16 DIAGNOSIS — G35 Multiple sclerosis: Secondary | ICD-10-CM | POA: Diagnosis not present

## 2020-03-18 ENCOUNTER — Other Ambulatory Visit: Payer: Self-pay | Admitting: Family Medicine

## 2020-03-21 DIAGNOSIS — G35 Multiple sclerosis: Secondary | ICD-10-CM | POA: Diagnosis not present

## 2020-03-21 DIAGNOSIS — B20 Human immunodeficiency virus [HIV] disease: Secondary | ICD-10-CM | POA: Diagnosis not present

## 2020-03-21 DIAGNOSIS — R270 Ataxia, unspecified: Secondary | ICD-10-CM | POA: Diagnosis not present

## 2020-03-21 DIAGNOSIS — Z8701 Personal history of pneumonia (recurrent): Secondary | ICD-10-CM | POA: Diagnosis not present

## 2020-03-21 DIAGNOSIS — F329 Major depressive disorder, single episode, unspecified: Secondary | ICD-10-CM | POA: Diagnosis not present

## 2020-03-21 DIAGNOSIS — E785 Hyperlipidemia, unspecified: Secondary | ICD-10-CM | POA: Diagnosis not present

## 2020-03-25 DIAGNOSIS — F329 Major depressive disorder, single episode, unspecified: Secondary | ICD-10-CM | POA: Diagnosis not present

## 2020-03-25 DIAGNOSIS — G35 Multiple sclerosis: Secondary | ICD-10-CM | POA: Diagnosis not present

## 2020-03-25 DIAGNOSIS — E785 Hyperlipidemia, unspecified: Secondary | ICD-10-CM | POA: Diagnosis not present

## 2020-03-25 DIAGNOSIS — R270 Ataxia, unspecified: Secondary | ICD-10-CM | POA: Diagnosis not present

## 2020-03-25 DIAGNOSIS — Z8701 Personal history of pneumonia (recurrent): Secondary | ICD-10-CM | POA: Diagnosis not present

## 2020-03-25 DIAGNOSIS — B20 Human immunodeficiency virus [HIV] disease: Secondary | ICD-10-CM | POA: Diagnosis not present

## 2020-03-26 DIAGNOSIS — Z8701 Personal history of pneumonia (recurrent): Secondary | ICD-10-CM | POA: Diagnosis not present

## 2020-03-26 DIAGNOSIS — B20 Human immunodeficiency virus [HIV] disease: Secondary | ICD-10-CM | POA: Diagnosis not present

## 2020-03-26 DIAGNOSIS — E785 Hyperlipidemia, unspecified: Secondary | ICD-10-CM | POA: Diagnosis not present

## 2020-03-26 DIAGNOSIS — F329 Major depressive disorder, single episode, unspecified: Secondary | ICD-10-CM | POA: Diagnosis not present

## 2020-03-26 DIAGNOSIS — R270 Ataxia, unspecified: Secondary | ICD-10-CM | POA: Diagnosis not present

## 2020-03-26 DIAGNOSIS — G35 Multiple sclerosis: Secondary | ICD-10-CM | POA: Diagnosis not present

## 2020-03-29 DIAGNOSIS — R270 Ataxia, unspecified: Secondary | ICD-10-CM | POA: Diagnosis not present

## 2020-03-29 DIAGNOSIS — B20 Human immunodeficiency virus [HIV] disease: Secondary | ICD-10-CM | POA: Diagnosis not present

## 2020-03-29 DIAGNOSIS — G35 Multiple sclerosis: Secondary | ICD-10-CM | POA: Diagnosis not present

## 2020-03-29 DIAGNOSIS — Z8701 Personal history of pneumonia (recurrent): Secondary | ICD-10-CM | POA: Diagnosis not present

## 2020-03-29 DIAGNOSIS — F329 Major depressive disorder, single episode, unspecified: Secondary | ICD-10-CM | POA: Diagnosis not present

## 2020-03-29 DIAGNOSIS — E785 Hyperlipidemia, unspecified: Secondary | ICD-10-CM | POA: Diagnosis not present

## 2020-03-30 DIAGNOSIS — B20 Human immunodeficiency virus [HIV] disease: Secondary | ICD-10-CM | POA: Diagnosis not present

## 2020-03-30 DIAGNOSIS — G35 Multiple sclerosis: Secondary | ICD-10-CM | POA: Diagnosis not present

## 2020-03-30 DIAGNOSIS — F329 Major depressive disorder, single episode, unspecified: Secondary | ICD-10-CM | POA: Diagnosis not present

## 2020-03-30 DIAGNOSIS — E785 Hyperlipidemia, unspecified: Secondary | ICD-10-CM | POA: Diagnosis not present

## 2020-03-30 DIAGNOSIS — R270 Ataxia, unspecified: Secondary | ICD-10-CM | POA: Diagnosis not present

## 2020-03-30 DIAGNOSIS — Z8701 Personal history of pneumonia (recurrent): Secondary | ICD-10-CM | POA: Diagnosis not present

## 2020-03-31 DIAGNOSIS — B20 Human immunodeficiency virus [HIV] disease: Secondary | ICD-10-CM | POA: Diagnosis not present

## 2020-03-31 DIAGNOSIS — Z8701 Personal history of pneumonia (recurrent): Secondary | ICD-10-CM | POA: Diagnosis not present

## 2020-03-31 DIAGNOSIS — F329 Major depressive disorder, single episode, unspecified: Secondary | ICD-10-CM | POA: Diagnosis not present

## 2020-03-31 DIAGNOSIS — R270 Ataxia, unspecified: Secondary | ICD-10-CM | POA: Diagnosis not present

## 2020-03-31 DIAGNOSIS — E785 Hyperlipidemia, unspecified: Secondary | ICD-10-CM | POA: Diagnosis not present

## 2020-03-31 DIAGNOSIS — G35 Multiple sclerosis: Secondary | ICD-10-CM | POA: Diagnosis not present

## 2020-04-05 ENCOUNTER — Telehealth: Payer: Self-pay | Admitting: Family Medicine

## 2020-04-05 DIAGNOSIS — F329 Major depressive disorder, single episode, unspecified: Secondary | ICD-10-CM | POA: Diagnosis not present

## 2020-04-05 DIAGNOSIS — E785 Hyperlipidemia, unspecified: Secondary | ICD-10-CM | POA: Diagnosis not present

## 2020-04-05 DIAGNOSIS — B20 Human immunodeficiency virus [HIV] disease: Secondary | ICD-10-CM | POA: Diagnosis not present

## 2020-04-05 DIAGNOSIS — G35 Multiple sclerosis: Secondary | ICD-10-CM | POA: Diagnosis not present

## 2020-04-05 DIAGNOSIS — R270 Ataxia, unspecified: Secondary | ICD-10-CM | POA: Diagnosis not present

## 2020-04-05 DIAGNOSIS — Z8701 Personal history of pneumonia (recurrent): Secondary | ICD-10-CM | POA: Diagnosis not present

## 2020-04-05 NOTE — Telephone Encounter (Signed)
Thank you for the thank you for the notification If any severe pain or problems recommend follow-up we can do x-ray But if just mainly sore hopefully will improve over the next several days please follow-up if any ongoing troubles

## 2020-04-05 NOTE — Telephone Encounter (Signed)
Kerry Solis from [facility] calling to report that patient fell last night. Pt was up in the middle of the night and went to sit on his wheelchair and it was unlocked therefore rolling out for underneath him. Pt did fall on sacral area and is stating that bottom is sore.

## 2020-04-05 NOTE — Telephone Encounter (Signed)
Tried to contact patient. No voicemail set up at this time

## 2020-04-07 NOTE — Telephone Encounter (Signed)
Patient notified

## 2020-04-11 ENCOUNTER — Encounter: Payer: Self-pay | Admitting: Family Medicine

## 2020-04-11 ENCOUNTER — Other Ambulatory Visit: Payer: Self-pay

## 2020-04-11 ENCOUNTER — Ambulatory Visit (INDEPENDENT_AMBULATORY_CARE_PROVIDER_SITE_OTHER): Payer: Medicare Other | Admitting: Family Medicine

## 2020-04-11 VITALS — BP 138/88 | HR 96 | Temp 97.6°F | Wt 207.0 lb

## 2020-04-11 DIAGNOSIS — I10 Essential (primary) hypertension: Secondary | ICD-10-CM | POA: Diagnosis not present

## 2020-04-11 DIAGNOSIS — G35 Multiple sclerosis: Secondary | ICD-10-CM

## 2020-04-11 DIAGNOSIS — Z23 Encounter for immunization: Secondary | ICD-10-CM

## 2020-04-11 NOTE — Progress Notes (Signed)
   Subjective:    Patient ID: Kerry Solis, male    DOB: 05-Oct-1945, 74 y.o.   MRN: [id number]  HPI Patient reports no improvement in fatigue or pain. Patient with a lot of fatigue tiredness.  Denies high fever chills sweats Energy level subpar Appetite doing okay Moods are doing better he was dealing with depression.  Try to work through this.  Taking his medications.  Patient takes care of himself but family members do look in on him. Patient fell 1 week ago and was sore for a couple of days but is feeling better now.   Review of Systems  Constitutional: Negative for activity change, appetite change and fatigue.  HENT: Negative for congestion and rhinorrhea.   Respiratory: Negative for cough and shortness of breath.   Cardiovascular: Negative for chest pain and leg swelling.  Gastrointestinal: Negative for abdominal pain, nausea and vomiting.  Neurological: Negative for dizziness and headaches.  Psychiatric/Behavioral: Negative for agitation and behavioral problems.       Objective:   Physical Exam Vitals reviewed.  Constitutional:      General: He is not in acute distress. HENT:     Head: Normocephalic and atraumatic.  Eyes:     General:        Right eye: No discharge.        Left eye: No discharge.  Neck:     Trachea: No tracheal deviation.  Cardiovascular:     Rate and Rhythm: Normal rate and regular rhythm.     Heart sounds: Normal heart sounds. No murmur heard.   Pulmonary:     Effort: Pulmonary effort is normal. No respiratory distress.     Breath sounds: Normal breath sounds.  Lymphadenopathy:     Cervical: No cervical adenopathy.  Skin:    General: Skin is warm and dry.  Neurological:     Mental Status: He is alert.     Coordination: Coordination normal.  Psychiatric:        Behavior: Behavior normal.           Assessment & Plan:  1. Essential hypertension Blood pressure good control continue current measures watch diet take meds  2. Multiple  sclerosis ([place]) MS being followed by specialist every 6 months  3. Need for vaccination Flu shot today - Flu Vaccine QUAD High Dose(Fluad)  Depression partial remission recommend healthy diet continue medications follow-up within 3 months sooner problems

## 2020-04-13 DIAGNOSIS — R270 Ataxia, unspecified: Secondary | ICD-10-CM | POA: Diagnosis not present

## 2020-04-13 DIAGNOSIS — E785 Hyperlipidemia, unspecified: Secondary | ICD-10-CM | POA: Diagnosis not present

## 2020-04-13 DIAGNOSIS — Z8701 Personal history of pneumonia (recurrent): Secondary | ICD-10-CM | POA: Diagnosis not present

## 2020-04-13 DIAGNOSIS — F329 Major depressive disorder, single episode, unspecified: Secondary | ICD-10-CM | POA: Diagnosis not present

## 2020-04-13 DIAGNOSIS — B20 Human immunodeficiency virus [HIV] disease: Secondary | ICD-10-CM | POA: Diagnosis not present

## 2020-04-13 DIAGNOSIS — G35 Multiple sclerosis: Secondary | ICD-10-CM | POA: Diagnosis not present

## 2020-04-15 DIAGNOSIS — F329 Major depressive disorder, single episode, unspecified: Secondary | ICD-10-CM | POA: Diagnosis not present

## 2020-04-15 DIAGNOSIS — R270 Ataxia, unspecified: Secondary | ICD-10-CM | POA: Diagnosis not present

## 2020-04-15 DIAGNOSIS — G35 Multiple sclerosis: Secondary | ICD-10-CM | POA: Diagnosis not present

## 2020-04-15 DIAGNOSIS — Z8701 Personal history of pneumonia (recurrent): Secondary | ICD-10-CM | POA: Diagnosis not present

## 2020-04-15 DIAGNOSIS — E785 Hyperlipidemia, unspecified: Secondary | ICD-10-CM | POA: Diagnosis not present

## 2020-04-15 DIAGNOSIS — B20 Human immunodeficiency virus [HIV] disease: Secondary | ICD-10-CM | POA: Diagnosis not present

## 2020-04-18 ENCOUNTER — Encounter: Payer: Self-pay | Admitting: Internal Medicine

## 2020-04-19 ENCOUNTER — Other Ambulatory Visit: Payer: Self-pay | Admitting: Family Medicine

## 2020-04-19 DIAGNOSIS — B20 Human immunodeficiency virus [HIV] disease: Secondary | ICD-10-CM | POA: Diagnosis not present

## 2020-04-19 DIAGNOSIS — F329 Major depressive disorder, single episode, unspecified: Secondary | ICD-10-CM | POA: Diagnosis not present

## 2020-04-19 DIAGNOSIS — Z8701 Personal history of pneumonia (recurrent): Secondary | ICD-10-CM | POA: Diagnosis not present

## 2020-04-19 DIAGNOSIS — R270 Ataxia, unspecified: Secondary | ICD-10-CM | POA: Diagnosis not present

## 2020-04-19 DIAGNOSIS — E785 Hyperlipidemia, unspecified: Secondary | ICD-10-CM | POA: Diagnosis not present

## 2020-04-19 DIAGNOSIS — G35 Multiple sclerosis: Secondary | ICD-10-CM | POA: Diagnosis not present

## 2020-04-19 DIAGNOSIS — Z23 Encounter for immunization: Secondary | ICD-10-CM | POA: Diagnosis not present

## 2020-04-20 ENCOUNTER — Other Ambulatory Visit: Payer: Self-pay | Admitting: Family Medicine

## 2020-04-20 DIAGNOSIS — R270 Ataxia, unspecified: Secondary | ICD-10-CM | POA: Diagnosis not present

## 2020-04-20 DIAGNOSIS — F32A Depression, unspecified: Secondary | ICD-10-CM | POA: Diagnosis not present

## 2020-04-20 DIAGNOSIS — G35 Multiple sclerosis: Secondary | ICD-10-CM | POA: Diagnosis not present

## 2020-04-20 DIAGNOSIS — Z8701 Personal history of pneumonia (recurrent): Secondary | ICD-10-CM | POA: Diagnosis not present

## 2020-04-20 DIAGNOSIS — B20 Human immunodeficiency virus [HIV] disease: Secondary | ICD-10-CM | POA: Diagnosis not present

## 2020-04-20 DIAGNOSIS — E785 Hyperlipidemia, unspecified: Secondary | ICD-10-CM | POA: Diagnosis not present

## 2020-04-25 DIAGNOSIS — E785 Hyperlipidemia, unspecified: Secondary | ICD-10-CM | POA: Diagnosis not present

## 2020-04-25 DIAGNOSIS — G35 Multiple sclerosis: Secondary | ICD-10-CM | POA: Diagnosis not present

## 2020-04-25 DIAGNOSIS — F32A Depression, unspecified: Secondary | ICD-10-CM | POA: Diagnosis not present

## 2020-04-25 DIAGNOSIS — R270 Ataxia, unspecified: Secondary | ICD-10-CM | POA: Diagnosis not present

## 2020-04-25 DIAGNOSIS — B20 Human immunodeficiency virus [HIV] disease: Secondary | ICD-10-CM | POA: Diagnosis not present

## 2020-04-25 DIAGNOSIS — Z8701 Personal history of pneumonia (recurrent): Secondary | ICD-10-CM | POA: Diagnosis not present

## 2020-04-27 DIAGNOSIS — B20 Human immunodeficiency virus [HIV] disease: Secondary | ICD-10-CM | POA: Diagnosis not present

## 2020-04-27 DIAGNOSIS — E785 Hyperlipidemia, unspecified: Secondary | ICD-10-CM | POA: Diagnosis not present

## 2020-04-27 DIAGNOSIS — F32A Depression, unspecified: Secondary | ICD-10-CM | POA: Diagnosis not present

## 2020-04-27 DIAGNOSIS — G35 Multiple sclerosis: Secondary | ICD-10-CM | POA: Diagnosis not present

## 2020-04-27 DIAGNOSIS — R270 Ataxia, unspecified: Secondary | ICD-10-CM | POA: Diagnosis not present

## 2020-04-27 DIAGNOSIS — Z8701 Personal history of pneumonia (recurrent): Secondary | ICD-10-CM | POA: Diagnosis not present

## 2020-05-04 DIAGNOSIS — R270 Ataxia, unspecified: Secondary | ICD-10-CM | POA: Diagnosis not present

## 2020-05-04 DIAGNOSIS — F32A Depression, unspecified: Secondary | ICD-10-CM | POA: Diagnosis not present

## 2020-05-04 DIAGNOSIS — Z8701 Personal history of pneumonia (recurrent): Secondary | ICD-10-CM | POA: Diagnosis not present

## 2020-05-04 DIAGNOSIS — G35 Multiple sclerosis: Secondary | ICD-10-CM | POA: Diagnosis not present

## 2020-05-04 DIAGNOSIS — E785 Hyperlipidemia, unspecified: Secondary | ICD-10-CM | POA: Diagnosis not present

## 2020-05-04 DIAGNOSIS — B20 Human immunodeficiency virus [HIV] disease: Secondary | ICD-10-CM | POA: Diagnosis not present

## 2020-05-09 DIAGNOSIS — R270 Ataxia, unspecified: Secondary | ICD-10-CM | POA: Diagnosis not present

## 2020-05-09 DIAGNOSIS — B20 Human immunodeficiency virus [HIV] disease: Secondary | ICD-10-CM | POA: Diagnosis not present

## 2020-05-09 DIAGNOSIS — Z8701 Personal history of pneumonia (recurrent): Secondary | ICD-10-CM | POA: Diagnosis not present

## 2020-05-09 DIAGNOSIS — F32A Depression, unspecified: Secondary | ICD-10-CM | POA: Diagnosis not present

## 2020-05-09 DIAGNOSIS — G35 Multiple sclerosis: Secondary | ICD-10-CM | POA: Diagnosis not present

## 2020-05-09 DIAGNOSIS — E785 Hyperlipidemia, unspecified: Secondary | ICD-10-CM | POA: Diagnosis not present

## 2020-05-19 ENCOUNTER — Other Ambulatory Visit: Payer: Self-pay | Admitting: Family Medicine

## 2020-05-19 DIAGNOSIS — R270 Ataxia, unspecified: Secondary | ICD-10-CM | POA: Diagnosis not present

## 2020-05-19 DIAGNOSIS — G35 Multiple sclerosis: Secondary | ICD-10-CM | POA: Diagnosis not present

## 2020-05-19 DIAGNOSIS — B20 Human immunodeficiency virus [HIV] disease: Secondary | ICD-10-CM | POA: Diagnosis not present

## 2020-05-20 DIAGNOSIS — F32A Depression, unspecified: Secondary | ICD-10-CM | POA: Diagnosis not present

## 2020-05-20 DIAGNOSIS — Z8701 Personal history of pneumonia (recurrent): Secondary | ICD-10-CM | POA: Diagnosis not present

## 2020-05-20 DIAGNOSIS — B20 Human immunodeficiency virus [HIV] disease: Secondary | ICD-10-CM | POA: Diagnosis not present

## 2020-05-20 DIAGNOSIS — E785 Hyperlipidemia, unspecified: Secondary | ICD-10-CM | POA: Diagnosis not present

## 2020-05-20 DIAGNOSIS — G35 Multiple sclerosis: Secondary | ICD-10-CM | POA: Diagnosis not present

## 2020-05-20 DIAGNOSIS — R270 Ataxia, unspecified: Secondary | ICD-10-CM | POA: Diagnosis not present

## 2020-05-23 ENCOUNTER — Ambulatory Visit (INDEPENDENT_AMBULATORY_CARE_PROVIDER_SITE_OTHER): Payer: Medicare Other | Admitting: Family Medicine

## 2020-05-23 ENCOUNTER — Encounter: Payer: Self-pay | Admitting: Family Medicine

## 2020-05-23 ENCOUNTER — Other Ambulatory Visit: Payer: Self-pay

## 2020-05-23 VITALS — BP 134/78 | HR 87 | Temp 96.6°F | Wt 208.4 lb

## 2020-05-23 DIAGNOSIS — E785 Hyperlipidemia, unspecified: Secondary | ICD-10-CM | POA: Diagnosis not present

## 2020-05-23 DIAGNOSIS — E1169 Type 2 diabetes mellitus with other specified complication: Secondary | ICD-10-CM | POA: Diagnosis not present

## 2020-05-23 DIAGNOSIS — D72829 Elevated white blood cell count, unspecified: Secondary | ICD-10-CM | POA: Diagnosis not present

## 2020-05-23 DIAGNOSIS — C61 Malignant neoplasm of prostate: Secondary | ICD-10-CM | POA: Diagnosis not present

## 2020-05-23 DIAGNOSIS — I10 Essential (primary) hypertension: Secondary | ICD-10-CM | POA: Diagnosis not present

## 2020-05-23 DIAGNOSIS — B20 Human immunodeficiency virus [HIV] disease: Secondary | ICD-10-CM

## 2020-05-23 DIAGNOSIS — G35 Multiple sclerosis: Secondary | ICD-10-CM

## 2020-05-23 MED ORDER — SERTRALINE HCL 50 MG PO TABS
50.0000 mg | ORAL_TABLET | Freq: Every day | ORAL | 0 refills | Status: DC
Start: 2020-05-23 — End: 2020-10-28

## 2020-05-23 NOTE — Progress Notes (Signed)
   Subjective:    Patient ID: Kerry Solis, male    DOB: 08-03-1945, 74 y.o.   MRN: [id number]  Back Pain This is a new problem. Episode onset: 3-4 WEEKS. Pain location: neck to bottom of back  Radiates to: lower abdomial region  The symptoms are aggravated by sitting. Pertinent negatives include no abdominal pain, chest pain or headaches. Risk factors include history of cancer. Treatments tried: tylenol BID. The treatment provided mild relief.    Patient relates that he has some intermittent lower back pain also intermittent lower abdominal pain denies rectal bleeding.  Some radiation into the legs.  Because of his age we want to try to avoid narcotic pain medication  Review of Systems  Constitutional: Negative for diaphoresis and fatigue.  HENT: Negative for congestion and rhinorrhea.   Respiratory: Negative for cough and shortness of breath.   Cardiovascular: Negative for chest pain and leg swelling.  Gastrointestinal: Negative for abdominal pain and diarrhea.  Musculoskeletal: Positive for back pain.  Skin: Negative for color change and rash.  Neurological: Negative for dizziness and headaches.  Psychiatric/Behavioral: Negative for behavioral problems and confusion.       Objective:   Physical Exam Vitals reviewed.  Constitutional:      General: He is not in acute distress. HENT:     Head: Normocephalic and atraumatic.  Eyes:     General:        Right eye: No discharge.        Left eye: No discharge.  Neck:     Trachea: No tracheal deviation.  Cardiovascular:     Rate and Rhythm: Normal rate and regular rhythm.     Heart sounds: Normal heart sounds. No murmur heard.   Pulmonary:     Effort: Pulmonary effort is normal. No respiratory distress.     Breath sounds: Normal breath sounds.  Lymphadenopathy:     Cervical: No cervical adenopathy.  Skin:    General: Skin is warm and dry.  Neurological:     Mental Status: He is alert.     Coordination: Coordination normal.    Psychiatric:        Behavior: Behavior normal.    Negative straight leg raise no edema in the legs Subjective discomfort in the lower back      Assessment & Plan:  1. Essential hypertension Blood pressure good control continue current measures watch portions - CBC with Differential - Hemoglobin A1c - PSA - Lipid Profile  2. Prostate cancer ([place]) History of prostate cancer was treated with radioactive seeds recheck PSA - CBC with Differential - Hemoglobin A1c - PSA - Lipid Profile  3. HIV INFECTION HIV infection treated by specialist  4. Multiple sclerosis ([place]) MS treated by specialist  5. Controlled type 2 diabetes mellitus with other specified complication, without long-term current use of insulin ([facility]) Diabetes with hyperlipidemia under decent control check A1c - CBC with Differential - Hemoglobin A1c - PSA - Lipid Profile  6. Hyperlipidemia associated with type 2 diabetes mellitus ([place]) Diabetes with hyperlipidemia watch diet take medication check lab work - CBC with Differential - Hemoglobin A1c - PSA - Lipid Profile  7. Leukocytosis, unspecified type History of leukocytosis check CBC - CBC with Differential - Hemoglobin A1c - PSA - Lipid Profile  Because of concern for interaction of medications reduce sertraline from 100 mg down to 50 mg  Recheck in 3 months

## 2020-05-24 DIAGNOSIS — R270 Ataxia, unspecified: Secondary | ICD-10-CM | POA: Diagnosis not present

## 2020-05-24 DIAGNOSIS — F32A Depression, unspecified: Secondary | ICD-10-CM | POA: Diagnosis not present

## 2020-05-24 DIAGNOSIS — G35 Multiple sclerosis: Secondary | ICD-10-CM | POA: Diagnosis not present

## 2020-05-24 DIAGNOSIS — B20 Human immunodeficiency virus [HIV] disease: Secondary | ICD-10-CM | POA: Diagnosis not present

## 2020-05-24 DIAGNOSIS — E785 Hyperlipidemia, unspecified: Secondary | ICD-10-CM | POA: Diagnosis not present

## 2020-05-24 DIAGNOSIS — Z8701 Personal history of pneumonia (recurrent): Secondary | ICD-10-CM | POA: Diagnosis not present

## 2020-05-24 LAB — LIPID PANEL
Chol/HDL Ratio: 4.1 ratio (ref 0.0–5.0)
Cholesterol, Total: 172 mg/dL (ref 100–199)
HDL: 42 mg/dL (ref >39)
LDL Chol Calc (NIH): 104 mg/dL — ABNORMAL HIGH (ref 0–99)
Triglycerides: 150 mg/dL — ABNORMAL HIGH (ref 0–149)
VLDL Cholesterol Cal: 26 mg/dL (ref 5–40)

## 2020-05-24 LAB — CBC WITH DIFFERENTIAL/PLATELET
Basophils Absolute: 0 10*3/uL (ref 0.0–0.2)
Basos: 1 %
EOS (ABSOLUTE): 0.1 10*3/uL (ref 0.0–0.4)
Eos: 2 %
Hematocrit: 45 % (ref 37.5–51.0)
Hemoglobin: 15.3 g/dL (ref 13.0–17.7)
Immature Grans (Abs): 0 10*3/uL (ref 0.0–0.1)
Immature Granulocytes: 0 %
Lymphocytes Absolute: 2.3 10*3/uL (ref 0.7–3.1)
Lymphs: 44 %
MCH: 28.5 pg (ref 26.6–33.0)
MCHC: 34 g/dL (ref 31.5–35.7)
MCV: 84 fL (ref 79–97)
Monocytes Absolute: 0.7 10*3/uL (ref 0.1–0.9)
Monocytes: 13 %
Neutrophils Absolute: 2.1 10*3/uL (ref 1.4–7.0)
Neutrophils: 40 %
Platelets: 265 10*3/uL (ref 150–450)
RBC: 5.36 x10E6/uL (ref 4.14–5.80)
RDW: 12.9 % (ref 11.6–15.4)
WBC: 5.2 10*3/uL (ref 3.4–10.8)

## 2020-05-24 LAB — HEMOGLOBIN A1C
Est. average glucose Bld gHb Est-mCnc: 151 mg/dL
Hgb A1c MFr Bld: 6.9 % — ABNORMAL HIGH (ref 4.8–5.6)

## 2020-05-24 LAB — PSA: Prostate Specific Ag, Serum: <0.1 ng/mL (ref 0.0–4.0)

## 2020-05-24 NOTE — Progress Notes (Signed)
Called and discussed with pt. Pt verbalized understanding and sertraline 50mg  one daily for 2 weeks then stop script had already been sent pharm.

## 2020-05-26 ENCOUNTER — Other Ambulatory Visit: Payer: Self-pay | Admitting: *Deleted

## 2020-05-26 MED ORDER — PRAVASTATIN SODIUM 40 MG PO TABS: 40.0000 mg | ORAL_TABLET | Freq: Every day | ORAL | 1 refills | Status: DC

## 2020-06-06 DIAGNOSIS — G35 Multiple sclerosis: Secondary | ICD-10-CM | POA: Diagnosis not present

## 2020-06-06 DIAGNOSIS — Z79899 Other long term (current) drug therapy: Secondary | ICD-10-CM | POA: Diagnosis not present

## 2020-06-09 ENCOUNTER — Other Ambulatory Visit: Payer: Self-pay | Admitting: Family Medicine

## 2020-06-14 ENCOUNTER — Telehealth: Payer: Self-pay | Admitting: Family Medicine

## 2020-06-14 DIAGNOSIS — G35 Multiple sclerosis: Secondary | ICD-10-CM | POA: Diagnosis not present

## 2020-06-14 DIAGNOSIS — B20 Human immunodeficiency virus [HIV] disease: Secondary | ICD-10-CM | POA: Diagnosis not present

## 2020-06-14 DIAGNOSIS — F32A Depression, unspecified: Secondary | ICD-10-CM | POA: Diagnosis not present

## 2020-06-14 DIAGNOSIS — R270 Ataxia, unspecified: Secondary | ICD-10-CM | POA: Diagnosis not present

## 2020-06-14 DIAGNOSIS — Z8701 Personal history of pneumonia (recurrent): Secondary | ICD-10-CM | POA: Diagnosis not present

## 2020-06-14 DIAGNOSIS — E785 Hyperlipidemia, unspecified: Secondary | ICD-10-CM | POA: Diagnosis not present

## 2020-06-14 NOTE — Telephone Encounter (Signed)
Kerry Solis with [facility]- calling to get orders to discharge patient for home health. Pt is in agreement that he has reached his full potential and nurse states pt seems to be doing well. Please advise. Thank you.

## 2020-06-15 NOTE — Telephone Encounter (Signed)
Sharon notified.

## 2020-06-15 NOTE — Telephone Encounter (Signed)
May have verbal order to discharge

## 2020-06-28 ENCOUNTER — Telehealth: Payer: Self-pay | Admitting: Family Medicine

## 2020-06-28 NOTE — Telephone Encounter (Signed)
Telephone call- mailbox is full 

## 2020-06-28 NOTE — Telephone Encounter (Signed)
Patient is requesting something to joint pain. He states hurting all over his body. [place] please advise

## 2020-06-29 DIAGNOSIS — G35 Multiple sclerosis: Secondary | ICD-10-CM | POA: Diagnosis not present

## 2020-06-29 DIAGNOSIS — Z79899 Other long term (current) drug therapy: Secondary | ICD-10-CM | POA: Diagnosis not present

## 2020-07-07 NOTE — Telephone Encounter (Signed)
I believe it would be fine just to wait until he comes in for his office visit given that we have tried multiple times thank you

## 2020-07-07 NOTE — Telephone Encounter (Signed)
Telephone call- voicemail is full We have been unable to get in contact with patient to get additional information- he is scheduled for an office visit with Dr Nicki Reaper 07/12/20

## 2020-07-10 ENCOUNTER — Emergency Department (HOSPITAL_COMMUNITY): Payer: Medicare HMO

## 2020-07-10 ENCOUNTER — Other Ambulatory Visit: Payer: Self-pay

## 2020-07-10 ENCOUNTER — Inpatient Hospital Stay (HOSPITAL_COMMUNITY)
Admission: EM | Admit: 2020-07-10 | Discharge: 2020-07-13 | DRG: 177 | Disposition: A | Discharge status: Home Health | Payer: Medicare HMO | Attending: Internal Medicine | Admitting: Internal Medicine

## 2020-07-10 ENCOUNTER — Encounter (HOSPITAL_COMMUNITY): Payer: Self-pay

## 2020-07-10 DIAGNOSIS — Z8739 Personal history of other diseases of the musculoskeletal system and connective tissue: Secondary | ICD-10-CM

## 2020-07-10 DIAGNOSIS — E860 Dehydration: Secondary | ICD-10-CM | POA: Diagnosis present

## 2020-07-10 DIAGNOSIS — E119 Type 2 diabetes mellitus without complications: Secondary | ICD-10-CM | POA: Diagnosis present

## 2020-07-10 DIAGNOSIS — N179 Acute kidney failure, unspecified: Secondary | ICD-10-CM | POA: Diagnosis present

## 2020-07-10 DIAGNOSIS — Z7982 Long term (current) use of aspirin: Secondary | ICD-10-CM | POA: Diagnosis not present

## 2020-07-10 DIAGNOSIS — Z8249 Family history of ischemic heart disease and other diseases of the circulatory system: Secondary | ICD-10-CM

## 2020-07-10 DIAGNOSIS — D72819 Decreased white blood cell count, unspecified: Secondary | ICD-10-CM | POA: Diagnosis present

## 2020-07-10 DIAGNOSIS — E782 Mixed hyperlipidemia: Secondary | ICD-10-CM | POA: Diagnosis present

## 2020-07-10 DIAGNOSIS — E7849 Other hyperlipidemia: Secondary | ICD-10-CM | POA: Diagnosis not present

## 2020-07-10 DIAGNOSIS — R509 Fever, unspecified: Secondary | ICD-10-CM | POA: Diagnosis not present

## 2020-07-10 DIAGNOSIS — G35 Multiple sclerosis: Secondary | ICD-10-CM | POA: Diagnosis present

## 2020-07-10 DIAGNOSIS — I2510 Atherosclerotic heart disease of native coronary artery without angina pectoris: Secondary | ICD-10-CM | POA: Diagnosis present

## 2020-07-10 DIAGNOSIS — J9601 Acute respiratory failure with hypoxia: Secondary | ICD-10-CM | POA: Diagnosis present

## 2020-07-10 DIAGNOSIS — B20 Human immunodeficiency virus [HIV] disease: Secondary | ICD-10-CM | POA: Diagnosis present

## 2020-07-10 DIAGNOSIS — E1169 Type 2 diabetes mellitus with other specified complication: Secondary | ICD-10-CM | POA: Diagnosis not present

## 2020-07-10 DIAGNOSIS — E781 Pure hyperglyceridemia: Secondary | ICD-10-CM | POA: Diagnosis present

## 2020-07-10 DIAGNOSIS — U071 COVID-19: Principal | ICD-10-CM | POA: Diagnosis present

## 2020-07-10 DIAGNOSIS — R0602 Shortness of breath: Secondary | ICD-10-CM | POA: Diagnosis not present

## 2020-07-10 DIAGNOSIS — J1282 Pneumonia due to coronavirus disease 2019: Secondary | ICD-10-CM | POA: Diagnosis present

## 2020-07-10 DIAGNOSIS — Z91038 Other insect allergy status: Secondary | ICD-10-CM

## 2020-07-10 DIAGNOSIS — F418 Other specified anxiety disorders: Secondary | ICD-10-CM | POA: Diagnosis present

## 2020-07-10 DIAGNOSIS — I10 Essential (primary) hypertension: Secondary | ICD-10-CM | POA: Diagnosis present

## 2020-07-10 DIAGNOSIS — M1990 Unspecified osteoarthritis, unspecified site: Secondary | ICD-10-CM | POA: Diagnosis present

## 2020-07-10 DIAGNOSIS — Z833 Family history of diabetes mellitus: Secondary | ICD-10-CM | POA: Diagnosis not present

## 2020-07-10 DIAGNOSIS — I252 Old myocardial infarction: Secondary | ICD-10-CM

## 2020-07-10 DIAGNOSIS — J9600 Acute respiratory failure, unspecified whether with hypoxia or hypercapnia: Secondary | ICD-10-CM | POA: Diagnosis not present

## 2020-07-10 DIAGNOSIS — Z21 Asymptomatic human immunodeficiency virus [HIV] infection status: Secondary | ICD-10-CM | POA: Diagnosis present

## 2020-07-10 DIAGNOSIS — M109 Gout, unspecified: Secondary | ICD-10-CM | POA: Diagnosis present

## 2020-07-10 DIAGNOSIS — Z8546 Personal history of malignant neoplasm of prostate: Secondary | ICD-10-CM | POA: Diagnosis not present

## 2020-07-10 DIAGNOSIS — E785 Hyperlipidemia, unspecified: Secondary | ICD-10-CM | POA: Diagnosis present

## 2020-07-10 DIAGNOSIS — R4182 Altered mental status, unspecified: Secondary | ICD-10-CM | POA: Diagnosis present

## 2020-07-10 DIAGNOSIS — R410 Disorientation, unspecified: Secondary | ICD-10-CM | POA: Diagnosis not present

## 2020-07-10 DIAGNOSIS — Z923 Personal history of irradiation: Secondary | ICD-10-CM | POA: Diagnosis not present

## 2020-07-10 DIAGNOSIS — H409 Unspecified glaucoma: Secondary | ICD-10-CM | POA: Diagnosis present

## 2020-07-10 DIAGNOSIS — K760 Fatty (change of) liver, not elsewhere classified: Secondary | ICD-10-CM | POA: Diagnosis present

## 2020-07-10 DIAGNOSIS — Z79899 Other long term (current) drug therapy: Secondary | ICD-10-CM

## 2020-07-10 LAB — CBC WITH DIFFERENTIAL/PLATELET
Basophils Absolute: 0 10*3/uL (ref 0.0–0.1)
Basophils Relative: 0 %
Eosinophils Absolute: 0 10*3/uL (ref 0.0–0.5)
Eosinophils Relative: 0 %
HCT: 45.8 % (ref 39.0–52.0)
Hemoglobin: 14.5 g/dL (ref 13.0–17.0)
Lymphocytes Relative: 31 %
Lymphs Abs: 1 10*3/uL (ref 0.7–4.0)
MCHC: 31.7 g/dL (ref 30.0–36.0)
MCV: 89.1 fL (ref 80.0–100.0)
Monocytes Absolute: 0.3 10*3/uL (ref 0.1–1.0)
Monocytes Relative: 13 %
Neutro Abs: 2 10*3/uL (ref 1.7–7.7)
Neutrophils Relative %: 55 %
Platelets: 149 10*3/uL — ABNORMAL LOW (ref 150–400)
RBC: 5.14 MIL/uL (ref 4.22–5.81)
RDW: 14.6 % (ref 11.5–15.5)
WBC: 2.7 10*3/uL — ABNORMAL LOW (ref 4.0–10.5)

## 2020-07-10 LAB — COMPREHENSIVE METABOLIC PANEL
ALT: 37 U/L (ref 0–44)
AST: 65 U/L — ABNORMAL HIGH (ref 15–41)
Albumin: 4.2 g/dL (ref 3.5–5.0)
Alkaline Phosphatase: 58 U/L (ref 38–126)
Anion gap: 13 (ref 5–15)
BUN: 36 mg/dL — ABNORMAL HIGH (ref 8–23)
CO2: 20 mmol/L — ABNORMAL LOW (ref 22–32)
Calcium: 8.7 mg/dL — ABNORMAL LOW (ref 8.9–10.3)
Chloride: 106 mmol/L (ref 98–111)
Creatinine, Ser: 2 mg/dL — ABNORMAL HIGH (ref 0.61–1.24)
GFR, Estimated: 34 mL/min — ABNORMAL LOW (ref >60)
Glucose, Bld: 106 mg/dL — ABNORMAL HIGH (ref 70–99)
Potassium: 3.6 mmol/L (ref 3.5–5.1)
Sodium: 139 mmol/L (ref 135–145)
Total Bilirubin: 0.6 mg/dL (ref 0.3–1.2)
Total Protein: 8.2 g/dL — ABNORMAL HIGH (ref 6.5–8.1)

## 2020-07-10 LAB — TROPONIN I (HIGH SENSITIVITY): Troponin I (High Sensitivity): 16 ng/L (ref <18)

## 2020-07-10 LAB — FERRITIN: Ferritin: 429 ng/mL — ABNORMAL HIGH (ref 24–336)

## 2020-07-10 LAB — BRAIN NATRIURETIC PEPTIDE: B Natriuretic Peptide: 17 pg/mL (ref 0.0–100.0)

## 2020-07-10 LAB — POC SARS CORONAVIRUS 2 AG -  ED: SARS Coronavirus 2 Ag: POSITIVE — AB

## 2020-07-10 LAB — FIBRINOGEN: Fibrinogen: 666 mg/dL — ABNORMAL HIGH (ref 210–475)

## 2020-07-10 LAB — D-DIMER, QUANTITATIVE: D-Dimer, Quant: 0.72 ug/mL-FEU — ABNORMAL HIGH (ref 0.00–0.50)

## 2020-07-10 LAB — PROTIME-INR
INR: 1.1 (ref 0.8–1.2)
Prothrombin Time: 13.9 seconds (ref 11.4–15.2)

## 2020-07-10 LAB — C-REACTIVE PROTEIN: CRP: 0.8 mg/dL (ref <1.0)

## 2020-07-10 LAB — LACTIC ACID, PLASMA
Lactic Acid, Venous: 1.9 mmol/L (ref 0.5–1.9)
Lactic Acid, Venous: 1.1 mmol/L (ref 0.5–1.9)

## 2020-07-10 LAB — PROCALCITONIN: Procalcitonin: <0.1 ng/mL

## 2020-07-10 LAB — APTT: aPTT: 35 seconds (ref 24–36)

## 2020-07-10 LAB — LACTATE DEHYDROGENASE: LDH: 332 U/L — ABNORMAL HIGH (ref 98–192)

## 2020-07-10 MED ORDER — ONDANSETRON HCL 4 MG/2ML IJ SOLN: 4.0000 mg | Freq: Four times a day (QID) | INTRAMUSCULAR | Status: DC | PRN

## 2020-07-10 MED ORDER — SODIUM CHLORIDE 0.9 % IV SOLN
100.0000 mg | Freq: Every day | INTRAVENOUS | Status: DC
  Administered 2020-07-11 – 2020-07-13 (×3): 100 mg via INTRAVENOUS
  Filled 2020-07-10 (×3): qty 20

## 2020-07-10 MED ORDER — SODIUM CHLORIDE 0.9 % IV SOLN
100.0000 mg | INTRAVENOUS | Status: AC
  Administered 2020-07-10 (×2): 100 mg via INTRAVENOUS
  Filled 2020-07-10 (×2): qty 20

## 2020-07-10 MED ORDER — ONDANSETRON HCL 4 MG PO TABS: 4.0000 mg | ORAL_TABLET | Freq: Four times a day (QID) | ORAL | Status: DC | PRN

## 2020-07-10 MED ORDER — DEXAMETHASONE SODIUM PHOSPHATE 10 MG/ML IJ SOLN
6.0000 mg | INTRAMUSCULAR | Status: DC
  Administered 2020-07-10: 6 mg via INTRAVENOUS

## 2020-07-10 MED ORDER — SODIUM CHLORIDE 0.45 % IV SOLN: INTRAVENOUS | Status: AC

## 2020-07-10 MED ORDER — ASCORBIC ACID 500 MG PO TABS
500.0000 mg | ORAL_TABLET | Freq: Every day | ORAL | Status: DC
  Administered 2020-07-10 – 2020-07-13 (×4): 500 mg via ORAL
  Filled 2020-07-10 (×4): qty 1

## 2020-07-10 MED ORDER — ENOXAPARIN SODIUM 40 MG/0.4ML ~~LOC~~ SOLN
40.0000 mg | SUBCUTANEOUS | Status: DC
  Administered 2020-07-10 – 2020-07-12 (×3): 40 mg via SUBCUTANEOUS
  Filled 2020-07-10 (×3): qty 0.4

## 2020-07-10 MED ORDER — DEXAMETHASONE SODIUM PHOSPHATE 10 MG/ML IJ SOLN
8.0000 mg | Freq: Once | INTRAMUSCULAR | Status: DC
  Filled 2020-07-10: qty 1

## 2020-07-10 MED ORDER — GUAIFENESIN-DM 100-10 MG/5ML PO SYRP
10.0000 mL | ORAL_SOLUTION | ORAL | Status: DC | PRN
  Administered 2020-07-10: 10 mL via ORAL
  Filled 2020-07-10: qty 10

## 2020-07-10 MED ORDER — ACETAMINOPHEN 325 MG PO TABS
650.0000 mg | ORAL_TABLET | Freq: Once | ORAL | Status: AC
  Administered 2020-07-10: 650 mg via ORAL
  Filled 2020-07-10: qty 2

## 2020-07-10 MED ORDER — HYDROCOD POLST-CPM POLST ER 10-8 MG/5ML PO SUER: 5.0000 mL | Freq: Two times a day (BID) | ORAL | Status: DC | PRN

## 2020-07-10 MED ORDER — LACTATED RINGERS IV BOLUS (SEPSIS)
1000.0000 mL | Freq: Once | INTRAVENOUS | Status: AC
  Administered 2020-07-10: 1000 mL via INTRAVENOUS

## 2020-07-10 MED ORDER — ZINC SULFATE 220 (50 ZN) MG PO CAPS
220.0000 mg | ORAL_CAPSULE | Freq: Every day | ORAL | Status: DC
  Administered 2020-07-10 – 2020-07-13 (×4): 220 mg via ORAL
  Filled 2020-07-10 (×4): qty 1

## 2020-07-10 MED ORDER — ACETAMINOPHEN 650 MG RE SUPP: 650.0000 mg | Freq: Four times a day (QID) | RECTAL | Status: DC | PRN

## 2020-07-10 MED ORDER — ALBUTEROL SULFATE HFA 108 (90 BASE) MCG/ACT IN AERS
2.0000 | INHALATION_SPRAY | Freq: Four times a day (QID) | RESPIRATORY_TRACT | Status: DC
  Administered 2020-07-10 – 2020-07-11 (×3): 2 via RESPIRATORY_TRACT
  Filled 2020-07-10: qty 6.7

## 2020-07-10 MED ORDER — ACETAMINOPHEN 325 MG PO TABS: 650.0000 mg | ORAL_TABLET | Freq: Four times a day (QID) | ORAL | Status: DC | PRN

## 2020-07-10 NOTE — ED Triage Notes (Signed)
Pt to er, pt states that he is here for confusion for the past few weeks, states that he also has a fever.  Pt denies pain.

## 2020-07-10 NOTE — H&P (Signed)
History and Physical    [name] FBP:[id number] DOB: 04/18/1946 DOA: 07/10/2020  PCP: Kathyrn Drown, MD   Patient coming from: Home.   I have personally briefly reviewed patient's old medical records in [place]  Chief Complaint: AMS.  HPI: Kerry Solis is a 75 y.o. male with medical history significant of anxiety, depression, osteoarthritis, cryptococcal meningitis, glaucoma, hypertriglyceridemia, gout, HIV infection, hypertension, leukopenia, multiple sclerosis, CAD, history of MI, prostate cancer who is brought to the emergency department due to waxing and waning confusion for the past week with  fever today associated with dyspnea, myalgias, fatigue, decreased appetite and diarrhea.  He denies dyspnea, wheezing or hemoptysis.  No chest pain, palpitations, diaphoresis, PND, orthopnea or pitting edema of the lower extremities.  Denies abdominal pain, constipation, melena or hematochezia.  No dysuria, frequency hematuria.  No polyuria, polydipsia, polyphagia or blurred vision..  ED Course: Initial vital signs were temperature 101.4 F, pulse 102, respiration 20, BP 140/108 mmHg and O2 sat 92% on room air.  The patient   Lab work: CBC showed a white count of 2.7, with 55% neutrophils, 31% lymphocytes and 13% monocytes.  Hemoglobin 14.5 g/dL and platelets 149.  PT/INR/PTT within normal limits.  CMP showed a CO2 of 20 mmol/L and corrected calcium of 11.5 mg/dL.  The rest of the CMP electrolytes were normal.  Glucose 106, BUN 36 and creatinine 2.0 mg/dL.  Total protein is 8.2 g/dL and AST 65 units/L.  All other CMP values were normal.  Lactic acid was 1.9 mmol/L.  Imaging: A portable 1 view chest radiograph shows stable cardiac silhouette, chronic elevation of right hemidiaphragm with stable background of scaring without airspace disease, effusion or pneumothorax.  Please see image and full radiology report for further detail.  Review of Systems: As per HPI otherwise all other  systems reviewed and are negative.  Past Medical History:  Diagnosis Date  . Anxiety   . Arthritis   . 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 Depression   . Elevated liver enzymes   . Glaucoma   . High triglycerides   . History of gout   . HIV (human immunodeficiency virus infection) ([place])   . Hypertension   . Leukopenia    Low CD4  . Multiple sclerosis ([place])    uses a cane  . Myocardial infarction ([place])    45 years ago  . Prostate cancer [facility])    Past Surgical History:  Procedure Laterality Date  . BIOPSY  08/14/2016   Procedure: BIOPSY;  Surgeon: Danie Binder, MD;  Location[facility];  Service: Endoscopy;;  gastric bx's  . CHOLECYSTECTOMY  06/10/2011   Procedure: LAPAROSCOPIC CHOLECYSTECTOMY;  Surgeon: Donato Heinz, MD;  Location: [facility] ORS;  Service: General;  Laterality: N/A;  . COLONOSCOPY  03/10/2009   [id number] internal hemorrhoids/6-mm sessile ascending colon polyp/tortuous colon, diverticulosis. TA  . COLONOSCOPY WITH PROPOFOL N/A 04/19/2015   Dr. Oneida Alar: sessile serrated adenoma, surveillance in 5 years   . ESOPHAGOGASTRODUODENOSCOPY  06/23/2008   [id number] gastritis/schatzki ring  . ESOPHAGOGASTRODUODENOSCOPY (EGD) WITH PROPOFOL N/A 08/14/2016   Dr. Oneida Alar: moderate Schatzi's ring s/p dilation, LA Grade A esophagitis,small hiatal hernia, chronic gastritis (negative H.pylori), one duodenal diverticulum  . LAPAROSCOPIC APPENDECTOMY N/A 12/13/2017   Procedure: APPENDECTOMY LAPAROSCOPIC;  Surgeon: Aviva Signs, MD;  Location: [facility] ORS;  Service: General;  Laterality: N/A;  . NECK SURGERY  unsure   disc  . POLYPECTOMY N/A 04/19/2015  Procedure: POLYPECTOMY;  Surgeon: Danie Binder, MD;  Location: [facility] ORS;  Service: Endoscopy;  Laterality: N/A;  ascending colon  . PROSTATE BIOPSY    . RADIOACTIVE SEED IMPLANT N/A 11/29/2016   Procedure: RADIOACTIVE SEED IMPLANT/BRACHYTHERAPY IMPLANT;  Surgeon: Franchot Gallo, MD;  Location[facility];  Service: Urology;  Laterality: N/A;  81 seeds implanted  . SAVORY DILATION N/A 08/14/2016   Procedure: SAVORY DILATION;  Surgeon: Danie Binder, MD;  Location: [facility];  Service: Endoscopy;  Laterality: N/A;   Social History  reports that he has never smoked. He has never used smokeless tobacco. He reports that he does not drink alcohol and does not use drugs.  Allergies  Allergen Reactions  . Yellow Jacket Venom Swelling   Family History  Problem Relation Age of Onset  . Hypertension Mother   . Heart attack Mother   . Heart attack Father   . Cancer Sister        Breast  . Diabetes Sister   . Diabetes Brother   . Cancer Brother        unknown  . Cancer Brother        colon  . Cancer Sister        breast  . Cancer Brother        prostate  . Cancer Brother        colon   Prior to Admission medications   Medication Sig Start Date End Date Taking? Authorizing Provider  acetaminophen (TYLENOL) 325 MG tablet Take by mouth. 01/15/20  Yes [provider]  albuterol (VENTOLIN HFA) 108 (90 Base) MCG/ACT inhaler Inhale into the lungs.   Yes [provider]  atorvastatin (LIPITOR) 10 MG tablet Take 1 tablet by mouth daily. 05/26/09  Yes [provider]  gabapentin (NEURONTIN) 100 MG capsule Take by mouth. 06/29/20  Yes [provider]  lisinopril-hydrochlorothiazide (ZESTORETIC) 10-12.5 MG tablet daily. 11/12/17  Yes [provider]  polyethylene glycol powder (GLYCOLAX/MIRALAX) 17 GM/SCOOP powder Take by mouth. 01/15/20  Yes [provider]  allopurinol (ZYLOPRIM) 100 MG tablet TAKE (1) TABLET BY MOUTH ONCE DAILY FOR GOUT. Patient not taking: No sig reported 06/09/20   Kathyrn Drown, MD  ALPRAZolam (XANAX) 0.25 MG tablet TAKE 1 TABLET TWICE DAILY AS NEEDED FOR ANXIETY Patient taking differently: Take 0.25 mg by mouth 2 (two) times daily as needed. 02/23/20   Kathyrn Drown, MD  amitriptyline (ELAVIL) 25 MG  tablet Take by mouth.    [provider]  amLODipine (NORVASC) 5 MG tablet TAKE (1) TABLET BY MOUTH DAILY FOR HIGH BLOOD PRESSURE. Patient taking differently: Take 5 mg by mouth daily. 04/20/20   Kathyrn Drown, MD  aspirin EC 81 MG tablet Take 81 mg by mouth daily. Swallow whole.    [provider]  baclofen (LIORESAL) 10 MG tablet Take by mouth. 01/15/20   [provider]  Cholecalciferol (VITAMIN D3) 10 MCG (400 UNIT) CAPS Take by mouth.    [provider]  DESCOVY 200-25 MG tablet Take 1 tablet by mouth daily. 02/29/20   [provider]  dolutegravir (TIVICAY) 50 MG tablet TAKE 1 TABLET BY MOUTH EVERY DAY. STOP ATRIPLA 11/21/16   [provider]  folic acid (FOLVITE) A999333 MCG tablet Take by mouth.    [provider]  HYDROcodone-acetaminophen (NORCO/VICODIN) 5-325 MG tablet Take 1 tablet by mouth every 4 (four) hours as needed for moderate pain. 02/10/20   Kathyrn Drown,  MD  latanoprost (XALATAN) 0.005 % ophthalmic solution Place 1 drop at bedtime into both eyes.  07/15/14   [provider]  ondansetron (ZOFRAN-ODT) 8 MG disintegrating tablet Take by mouth.    [provider]  pantoprazole (PROTONIX) 40 MG tablet TAKE 1 TABLET BY MOUTH ONCE DAILY. 04/20/20   Kathyrn Drown, MD  potassium chloride SA (KLOR-CON) 20 MEQ tablet Take by mouth.    [provider]  pravastatin (PRAVACHOL) 40 MG tablet Take 1 tablet (40 mg total) by mouth daily. 05/26/20   Kathyrn Drown, MD  sertraline (ZOLOFT) 50 MG tablet Take 1 tablet (50 mg total) by mouth daily. 05/23/20   Kathyrn Drown, MD  tamsulosin (FLOMAX) 0.4 MG CAPS capsule TAKE 1 CAPSULE BY MOUTH AT BEDTIME FOR PROSTATE URINE FLOW 05/20/20   Kathyrn Drown, MD  vitamin B-6 (PYRIDOXINE) 25 MG tablet Take by mouth at bedtime. 01/15/20   [provider]   Physical Exam: Vitals:   07/10/20 1800 07/10/20 1905 07/10/20 1930 07/10/20 1945  BP: 131/78 129/81 119/68    Pulse: 95 83  85  Resp: (!) 22 (!) 29 (!) 36 17  Temp:      TempSrc:      SpO2: 94% (!) 88%  95%  Weight:      Height:       Constitutional: Looks acutely ill. Eyes: PERRL, lids and conjunctivae are injected. ENMT: Mucous membranes are dry.  Posterior pharynx clear of any exudate or lesions.  Neck: normal, supple, no masses, no thyromegaly Respiratory: Tachypneic in the low to mid 20s.  Bilateral scattered crackles.  No wheezing.  No accessory muscle use.  Cardiovascular: Regular rate and rhythm, no murmurs / rubs / gallops. No extremity edema. 2+ pedal pulses. No carotid bruits.  Abdomen: Obese, nondistended.  Bowel sounds positive.  Soft, no tenderness, no masses palpated. No hepatosplenomegaly. Musculoskeletal: Generalized weakness.  No clubbing / cyanosis. Good ROM, no contractures. Normal muscle tone.  Skin: no rashes, lesions, ulcers on very limited dermatological examination. Neurologic: CN 2-12 grossly intact. Sensation intact, DTR normal.  Nonfocal, generalized weakness in all 4 extremities.  Psychiatric: Alert and oriented x 2, partially oriented to situation, disoriented to time and date.  Labs on Admission: I have personally reviewed following labs and imaging studies  CBC: Recent Labs  Lab 07/10/20 1730  WBC 2.7*  NEUTROABS 2.0  HGB 14.5  HCT 45.8  MCV 89.1  PLT 123456*   Basic Metabolic Panel: Recent Labs  Lab 07/10/20 1730  NA 139  K 3.6  CL 106  CO2 20*  GLUCOSE 106*  BUN 36*  CREATININE 2.00*  CALCIUM 8.7*   GFR: Estimated Creatinine Clearance: 34.7 mL/min (A) (by C-G formula based on SCr of 2 mg/dL (H)).  Liver Function Tests: Recent Labs  Lab 07/10/20 1730  AST 65*  ALT 37  ALKPHOS 58  BILITOT 0.6  PROT 8.2*  ALBUMIN 4.2   Radiological Exams on Admission: DG Chest Portable 1 View  Result Date: 07/10/2020 CLINICAL DATA:  Short of breath, fever, confusion EXAM: PORTABLE CHEST 1 VIEW COMPARISON:  03/14/2020 FINDINGS: Single frontal view of  the chest demonstrates a stable cardiac silhouette. Chronic elevation right hemidiaphragm. Stable background scarring without airspace disease, effusion, or pneumothorax. No acute bony abnormality. IMPRESSION: 1. No acute intrathoracic process. Electronically Signed   By: Randa Ngo M.D.   On: 07/10/2020 18:20   EKG: Independently reviewed. Vent. rate 95 BPM PR interval * ms QRS duration  94 ms QT/QTc 353/444 ms P-R-T axes 15 -64 22 Sinus rhythm Prolonged PR interval Left anterior fascicular block Anteroseptal infarct, old Baseline wander in lead(s) V6  Assessment/Plan Principal Problem:   Pneumonia due to COVID-19 virus   Acute respiratory failure with hypoxia (HCC) Observation/telemetry. Continue supplemental oxygen. Flutter valve and incentive spirometry. Prone positioning as tolerated. Bronchodilators every 6 hours. Acetaminophen as needed. Antitussives as needed. Pharmacy to administer remdesivir. Dexamethasone 6 mg IVP daily. Follow CBC, CMP and inflammatory markers.  Active Problems:   AKI (acute kidney injury) ([place])   Dehydration  Time-limited IV hydration. Monitor intake and output. Avoid nephrotoxic medications. Follow-up renal function and electrolytes.    HIV (human immunodeficiency virus infection) ([place]) Continue Tivicay and Descovy. Follow-up with PCP and IV as an outpatient.    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 Monitor white blood cell count.    Essential hypertension Hold lisinopril and hydrochlorothiazide. Continue amlodipine 5 mg p.o. daily. Monitor blood pressure, renal function electrolytes.    Hyperlipemia Continue statin.    Diabetes type 2, controlled ([place]) Carbohydrate modified diet. Begin Tradjenta 5 mg p.o. daily. Begin glipizide XL 2.5 mg p.o. daily. May have possible allergy to insulin. Will discuss treatment options with pharmacy.    History of gout No longer taking allopurinol.    Glaucoma Continue  Xalatan drops.    Anxiety with depression Continue alprazolam 0.25 mg p.o. twice daily as needed. Continue sertraline 50 mg p.o. daily.    DVT prophylaxis: Lovenox SQ. Code Status:   Full code. Family Communication: Disposition Plan:   Patient is from:  Home.  Anticipated DC to:  Home.  Anticipated DC date:  07/12/2020.  Anticipated DC barriers: Clinical status.  Consults called:   Admission status:  Observation/telemetry.  Severity of Illness:  High due to coronavirus pneumonia on the patient with multiple risk factors for severe disease and poor outcome including age, type 2 diabetes, obesity, HIV infection, hypertension, etc.  Reubin Milan MD [facility]  How to contact the [facility] Attending or Consulting provider [place] or covering provider during after hours [place], for this patient?   1. Check the care team in [facility] [place] and look for a) attending/consulting [facility] provider listed and b) the [facility] team listed 2. Log into [email or web address] and use [place]'s universal password to access. If you do not have the password, please contact the hospital operator. 3. Locate the [facility],The provider you are looking for under [facility] and page to a number that you can be directly reached. 4. If you still have difficulty reaching the provider, please page the [facility] (Director on Call) for the Hospitalists listed on amion for assistance.  07/10/2020, 8:35 PM   This document was prepared using Dragon voice recognition software and may contain some unintended transcription errors.

## 2020-07-10 NOTE — ED Provider Notes (Signed)
[facility] EMERGENCY DEPARTMENT Provider Note   CSN: [id number] Arrival date & time: 07/10/20  1414     History Chief Complaint  Patient presents with  . Altered Mental Status    Kerry Solis is a 75 y.o. male.  HPI     Kerry Solis is a 75 y.o. male with past medical history significant for type 2 diabetes, HIV, hypertension, leukopenia, multiple sclerosis and prostate cancer who comes from home for increasing confusion that has been waxing and waning for more than 1 week. Fever today. Patient also complains of generalized body aches, headache and diarrhea. States he is vaccinated x3 for COVID. He is a poor historian and unable to provide any additional relevant information.  Past Medical History:  Diagnosis Date  . Anxiety   . Arthritis   . 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 Depression   . Elevated liver enzymes   . Glaucoma   . High triglycerides   . History of gout   . HIV (human immunodeficiency virus infection) ([place])   . Hypertension   . Leukopenia    Low CD4  . Multiple sclerosis ([place])    uses a cane  . Myocardial infarction ([place])    45 years ago  . Prostate cancer [facility])     Patient Active Problem List   Diagnosis Date Noted  . S/P laparoscopic appendectomy 12/13/2017  . Acute appendicitis   . Lesion of pancreas 04/24/2017  . Gastritis due to nonsteroidal anti-inflammatory drug (NSAID)   . Dysphagia 07/26/2016  . Prostate cancer ([place]) 06/27/2016  . Fall at home 03/15/2016  . Tibial plateau fracture, right 03/15/2016  . Inability to walk 03/15/2016  . Right medial tibial plateau fracture 03/15/2016  . Multiple sclerosis ([place])   . Fall   . Diabetes type 2, controlled ([place]) 10/05/2015  . Gout 10/05/2015  . Fatty liver 06/24/2015  . Hx of adenomatous colonic polyps 03/31/2015  . FH: colon cancer 03/31/2015  . Prediabetes 12/04/2013  . Hyperlipemia 03/11/2013  . Hyperglycemia 03/11/2013  . Stress fracture 01/14/2013   . Knee pain, chronic 01/14/2013  . RECTAL BLEEDING 02/15/2009  . HIV INFECTION 02/14/2009  . UNSPECIFIED VISUAL LOSS 02/14/2009  . Essential hypertension 02/14/2009  . CONSTIPATION 02/14/2009  . ABDOMINAL PAIN 02/14/2009  . Elevated transaminase level 02/14/2009  . HX OF GALLSTONE 02/14/2009    Past Surgical History:  Procedure Laterality Date  . BIOPSY  08/14/2016   Procedure: BIOPSY;  Surgeon: Danie Binder, MD;  Location: [facility];  Service: Endoscopy;;  gastric bx's  . CHOLECYSTECTOMY  06/10/2011   Procedure: LAPAROSCOPIC CHOLECYSTECTOMY;  Surgeon: Donato Heinz, MD;  Location: [facility] ORS;  Service: General;  Laterality: N/A;  . COLONOSCOPY  03/10/2009   [id number] internal hemorrhoids/6-mm sessile ascending colon polyp/tortuous colon, diverticulosis. TA  . COLONOSCOPY WITH PROPOFOL N/A 04/19/2015   Dr. Oneida Alar: sessile serrated adenoma, surveillance in 5 years   . ESOPHAGOGASTRODUODENOSCOPY  06/23/2008   [id number] gastritis/schatzki ring  . ESOPHAGOGASTRODUODENOSCOPY (EGD) WITH PROPOFOL N/A 08/14/2016   Dr. Oneida Alar: moderate Schatzi's ring s/p dilation, LA Grade A esophagitis,small hiatal hernia, chronic gastritis (negative H.pylori), one duodenal diverticulum  . LAPAROSCOPIC APPENDECTOMY N/A 12/13/2017   Procedure: APPENDECTOMY LAPAROSCOPIC;  Surgeon: Aviva Signs, MD;  Location: [facility] ORS;  Service: General;  Laterality: N/A;  . NECK SURGERY  unsure   disc  . POLYPECTOMY N/A 04/19/2015   Procedure: POLYPECTOMY;  Surgeon: Danie Binder, MD;  Location:  [facility] ORS;  Service: Endoscopy;  Laterality: N/A;  ascending colon  . PROSTATE BIOPSY    . RADIOACTIVE SEED IMPLANT N/A 11/29/2016   Procedure: RADIOACTIVE SEED IMPLANT/BRACHYTHERAPY IMPLANT;  Surgeon: Franchot Gallo, MD;  [facility];  Service: Urology;  Laterality: N/A;  81 seeds implanted  . SAVORY DILATION N/A 08/14/2016   Procedure: SAVORY DILATION;  Surgeon: Danie Binder, MD;  Location: [facility] ENDO  SU[facility];  Service: Endoscopy;  Laterality: N/A;       Family History  Problem Relation Age of Onset  . Hypertension Mother   . Heart attack Mother   . Heart attack Father   . Cancer Sister        Breast  . Diabetes Sister   . Diabetes Brother   . Cancer Brother        unknown  . Cancer Brother        colon  . Cancer Sister        breast  . Cancer Brother        prostate  . Cancer Brother        colon    Social History   Tobacco Use  . Smoking status: Never Smoker  . Smokeless tobacco: Never Used  Vaping Use  . Vaping Use: Never used  Substance Use Topics  . Alcohol use: No  . Drug use: No    Home Medications Prior to Admission medications   Medication Sig Start Date End Date Taking? Authorizing Provider  acetaminophen (TYLENOL) 325 MG tablet Take by mouth. 01/15/20   [provider]  allopurinol (ZYLOPRIM) 100 MG tablet TAKE (1) TABLET BY MOUTH ONCE DAILY FOR GOUT. 06/09/20   Kathyrn Drown, MD  ALPRAZolam Duanne Moron) 0.25 MG tablet TAKE 1 TABLET TWICE DAILY AS NEEDED FOR ANXIETY 02/23/20   Kathyrn Drown, MD  amitriptyline (ELAVIL) 25 MG tablet Take by mouth.    [provider]  amLODipine (NORVASC) 5 MG tablet TAKE (1) TABLET BY MOUTH DAILY FOR HIGH BLOOD PRESSURE. 04/20/20   Kathyrn Drown, MD  aspirin EC 81 MG tablet Take 81 mg by mouth daily. Swallow whole.    [provider]  Cholecalciferol (VITAMIN D3) 10 MCG (400 UNIT) CAPS Take by mouth.    [provider]  DESCOVY 200-25 MG tablet Take 1 tablet by mouth daily. 02/29/20   [provider]  dolutegravir (TIVICAY) 50 MG tablet TAKE 1 TABLET BY MOUTH EVERY DAY. STOP ATRIPLA 11/21/16   [provider]  folic acid (FOLVITE) 245 MCG tablet Take by mouth.    [provider]  HYDROcodone-acetaminophen (NORCO/VICODIN) 5-325 MG tablet Take 1 tablet by mouth every 4 (four) hours as needed for moderate pain. 02/10/20   Kathyrn Drown, MD  latanoprost (XALATAN) 0.005  % ophthalmic solution Place 1 drop at bedtime into both eyes.  07/15/14   [provider]  pantoprazole (PROTONIX) 40 MG tablet TAKE 1 TABLET BY MOUTH ONCE DAILY. 04/20/20   Kathyrn Drown, MD  pravastatin (PRAVACHOL) 40 MG tablet Take 1 tablet (40 mg total) by mouth daily. 05/26/20   Kathyrn Drown, MD  sertraline (ZOLOFT) 50 MG tablet Take 1 tablet (50 mg total) by mouth daily. 05/23/20   Kathyrn Drown, MD  tamsulosin (FLOMAX) 0.4 MG CAPS capsule TAKE 1 CAPSULE BY MOUTH AT BEDTIME FOR PROSTATE URINE FLOW 05/20/20   Kathyrn Drown, MD  vitamin B-6 (PYRIDOXINE) 25 MG tablet Take by mouth at bedtime. 01/15/20  [provider]    Allergies    Yellow jacket venom  Review of Systems   Review of Systems  Unable to perform ROS: Mental status change  Constitutional: Positive for fever.  Gastrointestinal: Positive for diarrhea.  Neurological: Positive for headaches.  Psychiatric/Behavioral: Positive for confusion.    Physical Exam Updated Vital Signs BP (!) 133/95 (BP Location: Left Arm)   Pulse 98   Temp (!) 100.9 F (38.3 C) (Oral)   Resp (!) 21   Ht 5' 8.5" (1.74 m)   Wt 90.7 kg   SpO2 96%   BMI 29.97 kg/m   Physical Exam Vitals and nursing note reviewed.  Constitutional:      Appearance: He is not toxic-appearing.     Comments: Patient appears lethargic will answer questions appropriately.  HENT:     Mouth/Throat:     Mouth: Mucous membranes are moist.  Cardiovascular:     Rate and Rhythm: Normal rate and regular rhythm.     Pulses: Normal pulses.  Pulmonary:     Effort: Pulmonary effort is normal. No respiratory distress.     Breath sounds: Normal breath sounds. No wheezing.  Abdominal:     Palpations: Abdomen is soft.     Tenderness: There is abdominal tenderness.  Musculoskeletal:     Cervical back: Normal range of motion. No tenderness.     Right lower leg: No edema.     Left lower leg: No edema.  Skin:    General: Skin is warm.     Capillary  Refill: Capillary refill takes less than 2 seconds.  Neurological:     General: No focal deficit present.     Mental Status: He is lethargic.     GCS: GCS eye subscore is 4. GCS verbal subscore is 5. GCS motor subscore is 6.     Sensory: Sensation is intact.     Motor: Motor function is intact.     Comments: Patient appears lethargic, wakes to answer questions. Answering questions appropriately. No facial droop or noticeable dysarthria. No pronator drift.     ED Results / Procedures / Treatments   Labs (all labs ordered are listed, but only abnormal results are displayed) Labs Reviewed  COMPREHENSIVE METABOLIC PANEL - Abnormal; Notable for the following components:      Result Value   CO2 20 (*)    Glucose, Bld 106 (*)    BUN 36 (*)    Creatinine, Ser 2.00 (*)    Calcium 8.7 (*)    Total Protein 8.2 (*)    AST 65 (*)    GFR, Estimated 34 (*)    All other components within normal limits  CBC WITH DIFFERENTIAL/PLATELET - Abnormal; Notable for the following components:   WBC 2.7 (*)    Platelets 149 (*)    All other components within normal limits  POC SARS CORONAVIRUS 2 AG -  ED - Abnormal; Notable for the following components:   SARS Coronavirus 2 Ag POSITIVE (*)    All other components within normal limits  URINE CULTURE  CULTURE, BLOOD (ROUTINE X 2)  CULTURE, BLOOD (ROUTINE X 2)  URINALYSIS, ROUTINE W REFLEX MICROSCOPIC  LACTIC ACID, PLASMA  LACTIC ACID, PLASMA  PROTIME-INR  APTT  TROPONIN I (HIGH SENSITIVITY)    EKG None  Radiology No results found.  Procedures Procedures (including critical care time)  Medications Ordered in ED Medications  acetaminophen (TYLENOL) tablet 650 mg (has no administration in time range)  lactated ringers bolus 1,000  mL (has no administration in time range)    ED Course  I have reviewed the triage vital signs and the nursing notes.  Pertinent labs & imaging results that were available during my care of the patient were  reviewed by me and considered in my medical decision making (see chart for details).    MDM Rules/Calculators/A&P                          1815  Pt stated that son in law brought him here.  Attempted to contact family members on file.  No answers. Left voicemail at son-in-law phone number [phone])   Patient able to provide limited historical information.  Reports confusion for 1 week and fever for 1 to 2 days.  He also complains of mild frontal headache and diarrhea.  Symptoms are suggestive of COVID.  Will obtain baseline labs and COVID testing.  Pt also seen by Dr. Langston Masker and he assumes care at end of shift, completion of work up pending.  .  Pt is slightly hypoxic and covid test is positive. Also has a new AKI.   He will likely need hospital admission.     Final Clinical Impression(s) / ED Diagnoses Final diagnoses:  None    Rx / DC Orders ED Discharge Orders    None       Kem Parkinson, PA-C 07/10/20 1911    Wyvonnia Dusky, MD 07/10/20 2308

## 2020-07-10 NOTE — ED Notes (Signed)
Pt placed on 2L [place] for hypoxia.

## 2020-07-11 DIAGNOSIS — M109 Gout, unspecified: Secondary | ICD-10-CM | POA: Diagnosis present

## 2020-07-11 DIAGNOSIS — I10 Essential (primary) hypertension: Secondary | ICD-10-CM | POA: Diagnosis present

## 2020-07-11 DIAGNOSIS — H409 Unspecified glaucoma: Secondary | ICD-10-CM | POA: Diagnosis present

## 2020-07-11 DIAGNOSIS — I252 Old myocardial infarction: Secondary | ICD-10-CM | POA: Diagnosis not present

## 2020-07-11 DIAGNOSIS — E781 Pure hyperglyceridemia: Secondary | ICD-10-CM | POA: Diagnosis present

## 2020-07-11 DIAGNOSIS — Z7982 Long term (current) use of aspirin: Secondary | ICD-10-CM | POA: Diagnosis not present

## 2020-07-11 DIAGNOSIS — J9601 Acute respiratory failure with hypoxia: Secondary | ICD-10-CM | POA: Diagnosis present

## 2020-07-11 DIAGNOSIS — E119 Type 2 diabetes mellitus without complications: Secondary | ICD-10-CM | POA: Diagnosis present

## 2020-07-11 DIAGNOSIS — G35 Multiple sclerosis: Secondary | ICD-10-CM | POA: Diagnosis present

## 2020-07-11 DIAGNOSIS — E785 Hyperlipidemia, unspecified: Secondary | ICD-10-CM | POA: Diagnosis present

## 2020-07-11 DIAGNOSIS — Z8249 Family history of ischemic heart disease and other diseases of the circulatory system: Secondary | ICD-10-CM | POA: Diagnosis not present

## 2020-07-11 DIAGNOSIS — M1990 Unspecified osteoarthritis, unspecified site: Secondary | ICD-10-CM | POA: Diagnosis present

## 2020-07-11 DIAGNOSIS — E860 Dehydration: Secondary | ICD-10-CM | POA: Diagnosis present

## 2020-07-11 DIAGNOSIS — J1282 Pneumonia due to coronavirus disease 2019: Secondary | ICD-10-CM | POA: Diagnosis present

## 2020-07-11 DIAGNOSIS — I2510 Atherosclerotic heart disease of native coronary artery without angina pectoris: Secondary | ICD-10-CM | POA: Diagnosis present

## 2020-07-11 DIAGNOSIS — Z8546 Personal history of malignant neoplasm of prostate: Secondary | ICD-10-CM | POA: Diagnosis not present

## 2020-07-11 DIAGNOSIS — E1169 Type 2 diabetes mellitus with other specified complication: Secondary | ICD-10-CM

## 2020-07-11 DIAGNOSIS — Z923 Personal history of irradiation: Secondary | ICD-10-CM | POA: Diagnosis not present

## 2020-07-11 DIAGNOSIS — D72819 Decreased white blood cell count, unspecified: Secondary | ICD-10-CM | POA: Diagnosis present

## 2020-07-11 DIAGNOSIS — Z21 Asymptomatic human immunodeficiency virus [HIV] infection status: Secondary | ICD-10-CM | POA: Diagnosis present

## 2020-07-11 DIAGNOSIS — E7849 Other hyperlipidemia: Secondary | ICD-10-CM

## 2020-07-11 DIAGNOSIS — Z833 Family history of diabetes mellitus: Secondary | ICD-10-CM | POA: Diagnosis not present

## 2020-07-11 DIAGNOSIS — F418 Other specified anxiety disorders: Secondary | ICD-10-CM | POA: Diagnosis present

## 2020-07-11 DIAGNOSIS — R4182 Altered mental status, unspecified: Secondary | ICD-10-CM | POA: Diagnosis present

## 2020-07-11 DIAGNOSIS — N179 Acute kidney failure, unspecified: Secondary | ICD-10-CM | POA: Diagnosis present

## 2020-07-11 DIAGNOSIS — B20 Human immunodeficiency virus [HIV] disease: Secondary | ICD-10-CM

## 2020-07-11 DIAGNOSIS — U071 COVID-19: Secondary | ICD-10-CM | POA: Diagnosis present

## 2020-07-11 DIAGNOSIS — J9600 Acute respiratory failure, unspecified whether with hypoxia or hypercapnia: Secondary | ICD-10-CM

## 2020-07-11 DIAGNOSIS — K760 Fatty (change of) liver, not elsewhere classified: Secondary | ICD-10-CM | POA: Diagnosis present

## 2020-07-11 LAB — URINALYSIS, ROUTINE W REFLEX MICROSCOPIC
Bacteria, UA: NONE SEEN
Bilirubin Urine: NEGATIVE
Glucose, UA: NEGATIVE mg/dL
Ketones, ur: NEGATIVE mg/dL
Leukocytes,Ua: NEGATIVE
Nitrite: NEGATIVE
Protein, ur: >=300 mg/dL — AB
Specific Gravity, Urine: 1.019 (ref 1.005–1.030)
pH: 5 (ref 5.0–8.0)

## 2020-07-11 LAB — CBC WITH DIFFERENTIAL/PLATELET
Abs Immature Granulocytes: 0.01 10*3/uL (ref 0.00–0.07)
Basophils Absolute: 0 10*3/uL (ref 0.0–0.1)
Basophils Relative: 0 %
Eosinophils Absolute: 0 10*3/uL (ref 0.0–0.5)
Eosinophils Relative: 0 %
HCT: 42.9 % (ref 39.0–52.0)
Hemoglobin: 13.3 g/dL (ref 13.0–17.0)
Immature Granulocytes: 1 %
Lymphocytes Relative: 50 %
Lymphs Abs: 0.8 10*3/uL (ref 0.7–4.0)
MCH: 27.9 pg (ref 26.0–34.0)
MCHC: 31 g/dL (ref 30.0–36.0)
MCV: 89.9 fL (ref 80.0–100.0)
Monocytes Absolute: 0.1 10*3/uL (ref 0.1–1.0)
Monocytes Relative: 7 %
Neutro Abs: 0.6 10*3/uL — ABNORMAL LOW (ref 1.7–7.7)
Neutrophils Relative %: 42 %
Platelets: 146 10*3/uL — ABNORMAL LOW (ref 150–400)
RBC: 4.77 MIL/uL (ref 4.22–5.81)
RDW: 14.5 % (ref 11.5–15.5)
WBC: 1.5 10*3/uL — ABNORMAL LOW (ref 4.0–10.5)
nRBC: 0 % (ref 0.0–0.2)

## 2020-07-11 LAB — CBG MONITORING, ED
Glucose-Capillary: 143 mg/dL — ABNORMAL HIGH (ref 70–99)
Glucose-Capillary: 145 mg/dL — ABNORMAL HIGH (ref 70–99)
Glucose-Capillary: 154 mg/dL — ABNORMAL HIGH (ref 70–99)
Glucose-Capillary: 164 mg/dL — ABNORMAL HIGH (ref 70–99)

## 2020-07-11 LAB — COMPREHENSIVE METABOLIC PANEL
ALT: 32 U/L (ref 0–44)
AST: 49 U/L — ABNORMAL HIGH (ref 15–41)
Albumin: 3.3 g/dL — ABNORMAL LOW (ref 3.5–5.0)
Alkaline Phosphatase: 52 U/L (ref 38–126)
Anion gap: 10 (ref 5–15)
BUN: 42 mg/dL — ABNORMAL HIGH (ref 8–23)
CO2: 20 mmol/L — ABNORMAL LOW (ref 22–32)
Calcium: 8.3 mg/dL — ABNORMAL LOW (ref 8.9–10.3)
Chloride: 110 mmol/L (ref 98–111)
Creatinine, Ser: 1.68 mg/dL — ABNORMAL HIGH (ref 0.61–1.24)
GFR, Estimated: 42 mL/min — ABNORMAL LOW (ref >60)
Glucose, Bld: 173 mg/dL — ABNORMAL HIGH (ref 70–99)
Potassium: 3.9 mmol/L (ref 3.5–5.1)
Sodium: 140 mmol/L (ref 135–145)
Total Bilirubin: 0.5 mg/dL (ref 0.3–1.2)
Total Protein: 6.8 g/dL (ref 6.5–8.1)

## 2020-07-11 LAB — D-DIMER, QUANTITATIVE: D-Dimer, Quant: 0.54 ug/mL-FEU — ABNORMAL HIGH (ref 0.00–0.50)

## 2020-07-11 LAB — PHOSPHORUS: Phosphorus: 4.8 mg/dL — ABNORMAL HIGH (ref 2.5–4.6)

## 2020-07-11 LAB — FERRITIN: Ferritin: 390 ng/mL — ABNORMAL HIGH (ref 24–336)

## 2020-07-11 LAB — GLUCOSE, CAPILLARY: Glucose-Capillary: 142 mg/dL — ABNORMAL HIGH (ref 70–99)

## 2020-07-11 LAB — MAGNESIUM: Magnesium: 2.3 mg/dL (ref 1.7–2.4)

## 2020-07-11 LAB — C-REACTIVE PROTEIN: CRP: 0.7 mg/dL (ref <1.0)

## 2020-07-11 MED ORDER — GLIPIZIDE 5 MG PO TABS: 2.5000 mg | ORAL_TABLET | Freq: Every day | ORAL | Status: DC

## 2020-07-11 MED ORDER — LINAGLIPTIN 5 MG PO TABS
5.0000 mg | ORAL_TABLET | Freq: Every day | ORAL | Status: DC
  Administered 2020-07-11 – 2020-07-13 (×3): 5 mg via ORAL
  Filled 2020-07-11 (×3): qty 1

## 2020-07-11 MED ORDER — ALBUTEROL SULFATE HFA 108 (90 BASE) MCG/ACT IN AERS: 2.0000 | INHALATION_SPRAY | Freq: Four times a day (QID) | RESPIRATORY_TRACT | Status: DC | PRN

## 2020-07-11 MED ORDER — PANTOPRAZOLE SODIUM 40 MG PO TBEC
40.0000 mg | DELAYED_RELEASE_TABLET | Freq: Every day | ORAL | Status: DC
  Administered 2020-07-11 – 2020-07-13 (×3): 40 mg via ORAL
  Filled 2020-07-11 (×3): qty 1

## 2020-07-11 MED ORDER — EMTRICITABINE-TENOFOVIR AF 200-25 MG PO TABS
1.0000 | ORAL_TABLET | Freq: Every day | ORAL | Status: DC
  Administered 2020-07-11 – 2020-07-13 (×3): 1 via ORAL
  Filled 2020-07-11 (×5): qty 1

## 2020-07-11 MED ORDER — INSULIN ASPART 100 UNIT/ML ~~LOC~~ SOLN
0.0000 [IU] | Freq: Three times a day (TID) | SUBCUTANEOUS | Status: DC
  Administered 2020-07-11: 1 [IU] via SUBCUTANEOUS
  Administered 2020-07-11: 2 [IU] via SUBCUTANEOUS
  Administered 2020-07-12: 1 [IU] via SUBCUTANEOUS
  Administered 2020-07-12: 2 [IU] via SUBCUTANEOUS
  Administered 2020-07-12: 1 [IU] via SUBCUTANEOUS
  Filled 2020-07-11 (×2): qty 1

## 2020-07-11 MED ORDER — ASPIRIN EC 81 MG PO TBEC
81.0000 mg | DELAYED_RELEASE_TABLET | Freq: Every day | ORAL | Status: DC
  Administered 2020-07-11 – 2020-07-13 (×3): 81 mg via ORAL
  Filled 2020-07-11 (×3): qty 1

## 2020-07-11 MED ORDER — ALPRAZOLAM 0.25 MG PO TABS: 0.2500 mg | ORAL_TABLET | Freq: Two times a day (BID) | ORAL | Status: DC | PRN

## 2020-07-11 MED ORDER — DOLUTEGRAVIR SODIUM 50 MG PO TABS
50.0000 mg | ORAL_TABLET | Freq: Every day | ORAL | Status: DC
  Administered 2020-07-11 – 2020-07-13 (×3): 50 mg via ORAL
  Filled 2020-07-11 (×5): qty 1

## 2020-07-11 MED ORDER — SODIUM CHLORIDE 0.9 % IV SOLN: INTRAVENOUS | Status: AC

## 2020-07-11 MED ORDER — METHYLPREDNISOLONE SODIUM SUCC 125 MG IJ SOLR
60.0000 mg | Freq: Two times a day (BID) | INTRAMUSCULAR | Status: DC
  Administered 2020-07-11 – 2020-07-13 (×5): 60 mg via INTRAVENOUS
  Filled 2020-07-11 (×5): qty 2

## 2020-07-11 MED ORDER — PRAVASTATIN SODIUM 40 MG PO TABS
40.0000 mg | ORAL_TABLET | Freq: Every day | ORAL | Status: DC
  Administered 2020-07-11 – 2020-07-13 (×3): 40 mg via ORAL
  Filled 2020-07-11 (×2): qty 1

## 2020-07-11 MED ORDER — SERTRALINE HCL 50 MG PO TABS
50.0000 mg | ORAL_TABLET | Freq: Every day | ORAL | Status: DC
  Administered 2020-07-11 – 2020-07-13 (×3): 50 mg via ORAL
  Filled 2020-07-11 (×3): qty 1

## 2020-07-11 MED ORDER — LATANOPROST 0.005 % OP SOLN
1.0000 [drp] | Freq: Every day | OPHTHALMIC | Status: DC
  Administered 2020-07-11 – 2020-07-12 (×2): 1 [drp] via OPHTHALMIC
  Filled 2020-07-11: qty 2.5

## 2020-07-11 MED ORDER — INSULIN ASPART 100 UNIT/ML ~~LOC~~ SOLN: 0.0000 [IU] | Freq: Every day | SUBCUTANEOUS | Status: DC

## 2020-07-11 MED ORDER — AMLODIPINE BESYLATE 5 MG PO TABS
5.0000 mg | ORAL_TABLET | Freq: Every day | ORAL | Status: DC
  Administered 2020-07-11 – 2020-07-13 (×3): 5 mg via ORAL
  Filled 2020-07-11 (×3): qty 1

## 2020-07-11 NOTE — ED Notes (Signed)
Update with care given to daughter Antinette [phone].

## 2020-07-11 NOTE — ED Notes (Signed)
Pt moved to room 16 so door is close and visible from nurses station.

## 2020-07-11 NOTE — ED Notes (Signed)
Only drinking water.  Pt says he is not hungry.

## 2020-07-11 NOTE — Progress Notes (Signed)
PROGRESS NOTE    Kerry Solis  TDD:[id number] DOB: 02/11/1946 DOA: 07/10/2020 PCP: Kathyrn Drown, MD   Chief Complaint  Patient presents with  . Altered Mental Status    Brief Narrative:  As per H&P written by Dr. Olevia Bowens on 07/10/2020 Kerry Solis is a 75 y.o. male with medical history significant of anxiety, depression, osteoarthritis, cryptococcal meningitis, glaucoma, hypertriglyceridemia, gout, HIV infection, hypertension, leukopenia, multiple sclerosis, CAD, history of MI, prostate cancer who is brought to the emergency department due to waxing and waning confusion for the past week with  fever today associated with dyspnea, myalgias, fatigue, decreased appetite and diarrhea.  He denies dyspnea, wheezing or hemoptysis.  No chest pain, palpitations, diaphoresis, PND, orthopnea or pitting edema of the lower extremities.  Denies abdominal pain, constipation, melena or hematochezia.  No dysuria, frequency hematuria.  No polyuria, polydipsia, polyphagia or blurred vision..  ED Course: Initial vital signs were temperature 101.4 F, pulse 102, respiration 20, BP 140/108 mmHg and O2 sat 92% on room air.  The patient   Lab work: CBC showed a white count of 2.7, with 55% neutrophils, 31% lymphocytes and 13% monocytes.  Hemoglobin 14.5 g/dL and platelets 149.  PT/INR/PTT within normal limits.  CMP showed a CO2 of 20 mmol/L and corrected calcium of 11.5 mg/dL.  The rest of the CMP electrolytes were normal.  Glucose 106, BUN 36 and creatinine 2.0 mg/dL.  Total protein is 8.2 g/dL and AST 65 units/L.  All other CMP values were normal.  Lactic acid was 1.9 mmol/L.  Imaging: A portable 1 view chest radiograph shows stable cardiac silhouette, chronic elevation of right hemidiaphragm with stable background of scaring without airspace disease, effusion or pneumothorax.  Please see image and full radiology report for further detail.  Assessment & Plan: 1-acute respiratory failure with hypoxia in the  setting of Pneumonia due to COVID-19 virus -Continue treatment with IV steroids and remdesivir -Patient has been encourage for prone positioning -Continue to use incentive spirometer and flutter valve -Wean off oxygen supplementation as tolerated -Continue as needed bronchodilator  2-HIV (human immunodeficiency virus infection) ([place]) -Continue Tivicay and Descovy  3-Essential hypertension -Follow vital signs -Blood pressure stable: -Continue amlodipine. -Holding lisinopril and HCTZ in the setting of acute kidney injury.  4-acute kidney injury -Continues to be secondary to prerenal azotemia -Continue to maintain adequate hydration -Continue minimizing the use of nephrotoxic agents and avoid contrast/hypotension.  5-Hyperlipemia -continue statin  6-Diabetes type 2, controlled ([place]) -Continue Tradjenta as per protocol for sugar management and patient with Covid -Holding glipizide -Patient start on sliding scale insulin -Continue multivitamin hydrate diet. -Will check A1c  7-anxiety/depression -Continue sertraline -Continue as needed alprazolam. -Stable mood currently  8-history of glaucoma -Continue Xalatan drops   DVT prophylaxis: Lovenox Code Status: Full code Family Communication: Daughter updated over the phone. Disposition:   Status is: Inpatient  Dispo: The patient is from: Home              Anticipated d/c is to: To be determined.              Anticipated d/c date is: 1-2 days              Patient currently no medically stable for discharge; complaining of shortness of breath, nonproductive cough and requiring 4 L nasal cannula supplementation.  Continue IV steroids, continue to follow inflammatory markers and provide as needed bronchodilators while weaning off of oxygen supplementation.     Consultants:   None  Procedures:  See below for x-ray reports.  Antimicrobials:  None   Subjective: Currently afebrile, no chest pain, no nausea, no  vomiting.  Still short winded with DVTs, requiring 4 L nasal cannula supplementation and complaining of nonproductive coughing spells and general malaise.  Objective: Vitals:   07/11/20 1400 07/11/20 1430 07/11/20 1500 07/11/20 1733  BP: 133/66 126/69 130/73   Pulse: 73 74 77 74  Resp: (!) 22 (!) 30 (!) 23 (!) 29  Temp:      TempSrc:      SpO2: 92% 91% 93% 93%  Weight:      Height:        Intake/Output Summary (Last 24 hours) at 07/11/2020 1800 Last data filed at 07/11/2020 1015 Gross per 24 hour  Intake 2054.41 ml  Output -  Net 2054.41 ml   Filed Weights   07/10/20 1439  Weight: 90.7 kg    Examination:  General exam: Appears calm and in no major distress; still complaining of intermittent coughing spells and requiring 4 L nasal cannula supplementation.  No chest pain, no nausea, no vomiting, no abdominal pain, no fever. Respiratory system: No using accessory muscles; positive rhonchi appreciated bilaterally.  No wheezing or crackles on exam. Cardiovascular system: S1 & S2 heard, RRR. No JVD, murmurs, rubs, gallops or clicks. No pedal edema appreciated.. Gastrointestinal system: Abdomen is nondistended, soft and nontender. No organomegaly or masses felt. Normal bowel sounds heard. Central nervous system: Moving 4 limbs spontaneously; following commands appropriately.  Cranial nerves grossly intact no focal deficits Extremities: No cyanosis or clubbing. Skin: No petechiae. Psychiatry: Oriented x2; following commands appropriately.  No agitation or combative behavior.   Data Reviewed: I have personally reviewed following labs and imaging studies  CBC: Recent Labs  Lab 07/10/20 1730 07/11/20 0719  WBC 2.7* 1.5*  NEUTROABS 2.0 0.6*  HGB 14.5 13.3  HCT 45.8 42.9  MCV 89.1 89.9  PLT 149* 146*    Basic Metabolic Panel: Recent Labs  Lab 07/10/20 1730 07/11/20 0719  NA 139 140  K 3.6 3.9  CL 106 110  CO2 20* 20*  GLUCOSE 106* 173*  BUN 36* 42*  CREATININE 2.00*  1.68*  CALCIUM 8.7* 8.3*  MG  --  2.3  PHOS  --  4.8*    GFR: Estimated Creatinine Clearance: 41.3 mL/min (A) (by C-G formula based on SCr of 1.68 mg/dL (H)).  Liver Function Tests: Recent Labs  Lab 07/10/20 1730 07/11/20 0719  AST 65* 49*  ALT 37 32  ALKPHOS 58 52  BILITOT 0.6 0.5  PROT 8.2* 6.8  ALBUMIN 4.2 3.3*    CBG: Recent Labs  Lab 07/11/20 0835 07/11/20 1345 07/11/20 1533  GLUCAP 143* 145* 154*     Recent Results (from the past 240 hour(s))  Blood Culture (routine x 2)     Status: None (Preliminary result)   Collection Time: 07/10/20  4:58 PM   Specimen: Right Antecubital; Blood  Result Value Ref Range Status   Specimen Description RIGHT ANTECUBITAL  Final   Special Requests   Final    BOTTLES DRAWN AEROBIC AND ANAEROBIC Blood Culture adequate volume   Culture   Final    NO GROWTH < 12 HOURS Performed at [facility], [address] [place], [place] [address]    Report Status PENDING  Incomplete  Blood Culture (routine x 2)     Status: None (Preliminary result)   Collection Time: 07/10/20  8:05 PM   Specimen: Left Antecubital; Blood  Result  Value Ref Range Status   Specimen Description LEFT ANTECUBITAL  Final   Special Requests   Final    BOTTLES DRAWN AEROBIC AND ANAEROBIC Blood Culture adequate volume   Culture   Final    NO GROWTH < 12 HOURS Performed at [facility], [address] [place], [place] [address]    Report Status PENDING  Incomplete     Radiology Studies: DG Chest Portable 1 View  Result Date: 07/10/2020 CLINICAL DATA:  Short of breath, fever, confusion EXAM: PORTABLE CHEST 1 VIEW COMPARISON:  03/14/2020 FINDINGS: Single frontal view of the chest demonstrates a stable cardiac silhouette. Chronic elevation right hemidiaphragm. Stable background scarring without airspace disease, effusion, or pneumothorax. No acute bony abnormality. IMPRESSION: 1. No acute intrathoracic process. Electronically Signed   By: Randa Ngo M.D.   On:  07/10/2020 18:20   Scheduled Meds: . amLODipine  5 mg Oral Daily  . vitamin C  500 mg Oral Daily  . aspirin EC  81 mg Oral Daily  . dolutegravir  50 mg Oral Daily  . emtricitabine-tenofovir AF  1 tablet Oral Daily  . enoxaparin (LOVENOX) injection  40 mg Subcutaneous Q24H  . insulin aspart  0-5 Units Subcutaneous QHS  . insulin aspart  0-9 Units Subcutaneous TID WC  . latanoprost  1 drop Both Eyes QHS  . linagliptin  5 mg Oral Daily  . methylPREDNISolone (SOLU-MEDROL) injection  60 mg Intravenous Q12H  . pantoprazole  40 mg Oral Daily  . pravastatin  40 mg Oral Daily  . sertraline  50 mg Oral Daily  . zinc sulfate  220 mg Oral Daily   Continuous Infusions: . sodium chloride 75 mL/hr at 07/11/20 0904  . remdesivir 100 mg in NS 100 mL Stopped (07/11/20 1015)     LOS: 0 days    Time spent: 30 minutes    Barton Dubois, MD [facility]   To contact the attending provider between 7A-7P or the covering provider during after hours 7P-7A, please log into the web site [email or web address] and access using universal [place] password for that web site. If you do not have the password, please call the hospital operator.  07/11/2020, 6:00 PM

## 2020-07-11 NOTE — ED Notes (Signed)
Pt found OOB on BSC with clothes off.  Patient cleaned and linen changed.  [place] applied.

## 2020-07-11 NOTE — ED Notes (Signed)
Pt refused food tray, only taking sips of water with meds.

## 2020-07-12 ENCOUNTER — Ambulatory Visit: Payer: Medicare HMO | Admitting: Family Medicine

## 2020-07-12 LAB — COMPREHENSIVE METABOLIC PANEL
ALT: 34 U/L (ref 0–44)
AST: 47 U/L — ABNORMAL HIGH (ref 15–41)
Albumin: 3.2 g/dL — ABNORMAL LOW (ref 3.5–5.0)
Alkaline Phosphatase: 46 U/L (ref 38–126)
Anion gap: 7 (ref 5–15)
BUN: 34 mg/dL — ABNORMAL HIGH (ref 8–23)
CO2: 21 mmol/L — ABNORMAL LOW (ref 22–32)
Calcium: 8.5 mg/dL — ABNORMAL LOW (ref 8.9–10.3)
Chloride: 115 mmol/L — ABNORMAL HIGH (ref 98–111)
Creatinine, Ser: 1.21 mg/dL (ref 0.61–1.24)
GFR, Estimated: >60 mL/min (ref >60)
Glucose, Bld: 174 mg/dL — ABNORMAL HIGH (ref 70–99)
Potassium: 4 mmol/L (ref 3.5–5.1)
Sodium: 143 mmol/L (ref 135–145)
Total Bilirubin: 0.4 mg/dL (ref 0.3–1.2)
Total Protein: 6.5 g/dL (ref 6.5–8.1)

## 2020-07-12 LAB — CBC WITH DIFFERENTIAL/PLATELET
Abs Immature Granulocytes: 0.01 10*3/uL (ref 0.00–0.07)
Basophils Absolute: 0 10*3/uL (ref 0.0–0.1)
Basophils Relative: 0 %
Eosinophils Absolute: 0 10*3/uL (ref 0.0–0.5)
Eosinophils Relative: 0 %
HCT: 40.6 % (ref 39.0–52.0)
Hemoglobin: 12.7 g/dL — ABNORMAL LOW (ref 13.0–17.0)
Immature Granulocytes: 1 %
Lymphocytes Relative: 35 %
Lymphs Abs: 0.5 10*3/uL — ABNORMAL LOW (ref 0.7–4.0)
MCH: 27.5 pg (ref 26.0–34.0)
MCHC: 31.3 g/dL (ref 30.0–36.0)
MCV: 88.1 fL (ref 80.0–100.0)
Monocytes Absolute: 0.1 10*3/uL (ref 0.1–1.0)
Monocytes Relative: 9 %
Neutro Abs: 0.7 10*3/uL — ABNORMAL LOW (ref 1.7–7.7)
Neutrophils Relative %: 55 %
Platelets: 187 10*3/uL (ref 150–400)
RBC: 4.61 MIL/uL (ref 4.22–5.81)
RDW: 14.6 % (ref 11.5–15.5)
WBC: 1.3 10*3/uL — CL (ref 4.0–10.5)
nRBC: 0 % (ref 0.0–0.2)

## 2020-07-12 LAB — C-REACTIVE PROTEIN: CRP: 0.5 mg/dL (ref <1.0)

## 2020-07-12 LAB — GLUCOSE, CAPILLARY
Glucose-Capillary: 149 mg/dL — ABNORMAL HIGH (ref 70–99)
Glucose-Capillary: 143 mg/dL — ABNORMAL HIGH (ref 70–99)
Glucose-Capillary: 146 mg/dL — ABNORMAL HIGH (ref 70–99)
Glucose-Capillary: 136 mg/dL — ABNORMAL HIGH (ref 70–99)

## 2020-07-12 LAB — FERRITIN: Ferritin: 563 ng/mL — ABNORMAL HIGH (ref 24–336)

## 2020-07-12 LAB — PHOSPHORUS: Phosphorus: 3.1 mg/dL (ref 2.5–4.6)

## 2020-07-12 LAB — MAGNESIUM: Magnesium: 2.2 mg/dL (ref 1.7–2.4)

## 2020-07-12 LAB — D-DIMER, QUANTITATIVE: D-Dimer, Quant: 0.46 ug/mL-FEU (ref 0.00–0.50)

## 2020-07-12 NOTE — Progress Notes (Addendum)
MD notified that patients WBC is 1.3.

## 2020-07-12 NOTE — Progress Notes (Addendum)
Patient had no complaints of shortness of breath. Patient has been on room air the entire night with an oxygen saturation of 94%.

## 2020-07-12 NOTE — Progress Notes (Signed)
PROGRESS NOTE    [name]  [id number] DOB: 1945/10/24 DOA: 07/10/2020 PCP: Kathyrn Drown, MD   Chief Complaint  Patient presents with  . Altered Mental Status    Brief Narrative:  As per H&P written by Dr. Olevia Bowens on 07/10/2020 Kerry Solis is a 75 y.o. male with medical history significant of anxiety, depression, osteoarthritis, cryptococcal meningitis, glaucoma, hypertriglyceridemia, gout, HIV infection, hypertension, leukopenia, multiple sclerosis, CAD, history of MI, prostate cancer who is brought to the emergency department due to waxing and waning confusion for the past week with  fever today associated with dyspnea, myalgias, fatigue, decreased appetite and diarrhea.  He denies dyspnea, wheezing or hemoptysis.  No chest pain, palpitations, diaphoresis, PND, orthopnea or pitting edema of the lower extremities.  Denies abdominal pain, constipation, melena or hematochezia.  No dysuria, frequency hematuria.  No polyuria, polydipsia, polyphagia or blurred vision..  ED Course: Initial vital signs were temperature 101.4 F, pulse 102, respiration 20, BP 140/108 mmHg and O2 sat 92% on room air.  The patient   Lab work: CBC showed a white count of 2.7, with 55% neutrophils, 31% lymphocytes and 13% monocytes.  Hemoglobin 14.5 g/dL and platelets 149.  PT/INR/PTT within normal limits.  CMP showed a CO2 of 20 mmol/L and corrected calcium of 11.5 mg/dL.  The rest of the CMP electrolytes were normal.  Glucose 106, BUN 36 and creatinine 2.0 mg/dL.  Total protein is 8.2 g/dL and AST 65 units/L.  All other CMP values were normal.  Lactic acid was 1.9 mmol/L.  Imaging: A portable 1 view chest radiograph shows stable cardiac silhouette, chronic elevation of right hemidiaphragm with stable background of scaring without airspace disease, effusion or pneumothorax.  Please see image and full radiology report for further detail.  Assessment & Plan: 1-acute respiratory failure with hypoxia in the  setting of Pneumonia due to COVID-19 virus -Continue treatment with IV steroids and remdesivir -Patient has been encourage for prone positioning -Continue to use incentive spirometer and flutter valve. -No requiring oxygen supplementation at rest; will check desaturation screening. -Continue as needed bronchodilator  2-HIV (human immunodeficiency virus infection) ([place]) -Continue Tivicay and Descovy  3-Essential hypertension -Follow vital signs -Blood pressure stable; patient has been encouraged to follow heart healthy diet. -Continue amlodipine. -Continue holding lisinopril and HCTZ in the setting of acute kidney injury.  4-acute kidney injury -Appears to be secondary to prerenal azotemia -Continue to maintain adequate hydration -Continue minimizing the use of nephrotoxic agents and avoid contrast/hypotension. -Follow renal function trend; today's creatinine 1.21  5-Hyperlipemia -continue statin  6-Diabetes type 2, controlled ([facility]) -Continue holding glipizide. -Continue sliding scale insulin and Tradjenta -Continue modified carbohydrate diet. -A1c 6.9.  7-anxiety/depression -Continue sertraline -Continue as needed alprazolam. -Stable mood currently  8-history of glaucoma -Continue Xalatan drops  9-physical deconditioning -Physical therapy evaluation has been requested  10-leukopenia -In the setting of HIV infection and Covid infection -Continue to follow WBCs trend. -Patient is afebrile.   DVT prophylaxis: Lovenox Code Status: Full code Family Communication: Daughter updated over the phone. Disposition:   Status is: Inpatient  Dispo: The patient is from: Home              Anticipated d/c is to: To be determined.              Anticipated d/c date is: 1-2 days              Patient currently no medically stable for discharge; complaining of short winded sensation  with activity; no chest pain, no nausea or vomiting.  Currently not requiring oxygen supplementation  at rest.  Expressing feeling weak and deconditioned.  Continue IV steroids, remdesivir, check desaturation screening and ask for physical therapy evaluation.     Consultants:   None     Procedures:  See below for x-ray reports.  Antimicrobials:  None   Subjective: During my examination no requiring oxygen supplementation; reports feeling slightly short winded with activity, complaining of intermittent dry coughing spells.  No nausea, no vomiting, no chest pain.  Patient is afebrile  Objective: Vitals:   07/11/20 2125 07/12/20 0034 07/12/20 0447 07/12/20 1642  BP:  129/65 111/62 135/64  Pulse:  60 62 70  Resp:  18 18 18   Temp:  98.7 F (37.1 C) 98.2 F (36.8 C) 97.8 F (36.6 C)  TempSrc:    Oral  SpO2:  100% 94% 94%  Weight: 89.9 kg     Height: 5' 8.5" (1.74 m)       Intake/Output Summary (Last 24 hours) at 07/12/2020 1741 Last data filed at 07/12/2020 0500 Gross per 24 hour  Intake -  Output 800 ml  Net -800 ml   Filed Weights   07/10/20 1439 07/11/20 2125  Weight: 90.7 kg 89.9 kg    Examination: General exam: Alert, awake, oriented x 2 (person and place, expressing being in the hospital but not knowing the name of this hospital).  Denies chest pain, no nausea, no vomiting and expressing improvement in his breathing.  Patient is afebrile.  Reports feeling weak, deconditioned and with intermittent dry coughing spells. Respiratory system: Positive rhonchi bilaterally; during my examination no oxygen requirement at rest appreciated.  No wheezing no crackles heard. Cardiovascular system: S1 and S2; regular rate and breathing.  No JVD.  No rubs or gallops. Gastrointestinal system: Abdomen is nondistended, soft and nontender. No organomegaly or masses felt. Normal bowel sounds heard. Central nervous system: No focal neurological deficits. Extremities: No cyanosis or clubbing. Skin: No no petechiae. Psychiatry: Mood appears to be stable currently; no agitation  appreciated.   Data Reviewed: I have personally reviewed following labs and imaging studies  CBC: Recent Labs  Lab 07/10/20 1730 07/11/20 0719 07/12/20 0431  WBC 2.7* 1.5* 1.3*  NEUTROABS 2.0 0.6* 0.7*  HGB 14.5 13.3 12.7*  HCT 45.8 42.9 40.6  MCV 89.1 89.9 88.1  PLT 149* 146* 474    Basic Metabolic Panel: Recent Labs  Lab 07/10/20 1730 07/11/20 0719 07/12/20 0431  NA 139 140 143  K 3.6 3.9 4.0  CL 106 110 115*  CO2 20* 20* 21*  GLUCOSE 106* 173* 174*  BUN 36* 42* 34*  CREATININE 2.00* 1.68* 1.21  CALCIUM 8.7* 8.3* 8.5*  MG  --  2.3 2.2  PHOS  --  4.8* 3.1    GFR: Estimated Creatinine Clearance: 57.1 mL/min (by C-G formula based on SCr of 1.21 mg/dL).  Liver Function Tests: Recent Labs  Lab 07/10/20 1730 07/11/20 0719 07/12/20 0431  AST 65* 49* 47*  ALT 37 32 34  ALKPHOS 58 52 46  BILITOT 0.6 0.5 0.4  PROT 8.2* 6.8 6.5  ALBUMIN 4.2 3.3* 3.2*    CBG: Recent Labs  Lab 07/11/20 1803 07/11/20 2109 07/12/20 0758 07/12/20 1249 07/12/20 1646  GLUCAP 164* 142* 149* 143* 146*     Recent Results (from the past 240 hour(s))  Blood Culture (routine x 2)     Status: None (Preliminary result)   Collection Time: 07/10/20  4:58 PM  Specimen: Right Antecubital; Blood  Result Value Ref Range Status   Specimen Description RIGHT ANTECUBITAL  Final   Special Requests   Final    BOTTLES DRAWN AEROBIC AND ANAEROBIC Blood Culture adequate volume   Culture   Final    NO GROWTH 2 DAYS Performed at [facility], [address], [place], [place] [address]    Report Status PENDING  Incomplete  Blood Culture (routine x 2)     Status: None (Preliminary result)   Collection Time: 07/10/20  8:05 PM   Specimen: Left Antecubital; Blood  Result Value Ref Range Status   Specimen Description LEFT ANTECUBITAL  Final   Special Requests   Final    BOTTLES DRAWN AEROBIC AND ANAEROBIC Blood Culture adequate volume   Culture   Final    NO GROWTH 2 DAYS Performed at [facility], [address] [place], [place] [address]    Report Status PENDING  Incomplete  Urine culture     Status: Abnormal (Preliminary result)   Collection Time: 07/11/20 12:00 AM   Specimen: In/Out Cath Urine  Result Value Ref Range Status   Specimen Description   Final    IN/OUT CATH URINE Performed at [facility], [address], [place], [place] [address]    Special Requests   Final    NONE Performed at [facility], [address], [place], [place] [address]    Culture (A)  Final    20,000 COLONIES/mL STAPHYLOCOCCUS EPIDERMIDIS SUSCEPTIBILITIES TO FOLLOW Performed at [facility], [place] [address] [place],  [address]    Report Status PENDING  Incomplete     Radiology Studies: DG Chest Portable 1 View  Result Date: 07/10/2020 CLINICAL DATA:  Short of breath, fever, confusion EXAM: PORTABLE CHEST 1 VIEW COMPARISON:  03/14/2020 FINDINGS: Single frontal view of the chest demonstrates a stable cardiac silhouette. Chronic elevation right hemidiaphragm. Stable background scarring without airspace disease, effusion, or pneumothorax. No acute bony abnormality. IMPRESSION: 1. No acute intrathoracic process. Electronically Signed   By: Randa Ngo M.D.   On: 07/10/2020 18:20   Scheduled Meds: . amLODipine  5 mg Oral Daily  . vitamin C  500 mg Oral Daily  . aspirin EC  81 mg Oral Daily  . dolutegravir  50 mg Oral Daily  . emtricitabine-tenofovir AF  1 tablet Oral Daily  . enoxaparin (LOVENOX) injection  40 mg Subcutaneous Q24H  . insulin aspart  0-5 Units Subcutaneous QHS  . insulin aspart  0-9 Units Subcutaneous TID WC  . latanoprost  1 drop Both Eyes QHS  . linagliptin  5 mg Oral Daily  . methylPREDNISolone (SOLU-MEDROL) injection  60 mg Intravenous Q12H  . pantoprazole  40 mg Oral Daily  . pravastatin  40 mg Oral Daily  . sertraline  50 mg Oral Daily  . zinc sulfate  220 mg Oral Daily   Continuous Infusions: . remdesivir 100 mg in NS 100 mL Stopped (07/12/20  1030)     LOS: 1 day    Time spent: 30 minutes    Barton Dubois, MD [facility]   To contact the attending provider between 7A-7P or the covering provider during after hours 7P-7A, please log into the web site [email or web address] and access using universal [place] password for that web site. If you do not have the password, please call the hospital operator.  07/12/2020, 5:41 PM

## 2020-07-12 NOTE — TOC Initial Note (Signed)
Transition of Care [facility]) - Initial/Assessment Note    Patient Details  Name: Kerry Solis MRN: [id number] Date of Birth: 1945-08-05  [facility] of [facility]) CM/SW Contact:    Ihor Gully, LCSW Phone Number: 07/12/2020, 2:01 PM  Clinical Narrative:                 Patient from home alone. Admitted COVID+. Considered high risk for readmission. At baseline, ambulates with a cane mostly, has walker. Drives. Performs ADLs. Review of chart show that [facility] and wc were ordered in 2017 due to tibial fx.  Ms. Lorelle Formosa, discussed that she would like for patient to relocate to [place] with her, however he does not want to leave his home. Patient has had [facility] services but "ran them away" per daughter.  TOC will follow patient through discharge and address needs as they arise.   Expected Discharge Plan: Home/Self Care Barriers to Discharge: Continued Medical Work up   Patient Goals and CMS Choice Patient states their goals for this hospitalization and ongoing recovery are:: return home   Choice offered to / list presented to : Adult Children  Expected Discharge Plan and Services Expected Discharge Plan: Home/Self Care       Living arrangements for the past 2 months: Single Family Home                                      Prior Living Arrangements/Services Living arrangements for the past 2 months: Single Family Home Lives with:: Self Patient language and need for interpreter reviewed:: Yes Do you feel safe going back to the place where you live?: Yes      Need for Family Participation in Patient Care: Yes (Comment) Care giver support system in place?: Yes (comment) Current home services: DME Criminal Activity/Legal Involvement Pertinent to Current Situation/Hospitalization: No - Comment as needed  Activities of Daily Living      Permission Sought/Granted Permission sought to share information with : Family Supports    Share Information with NAME: Ms. Lorelle Formosa      Permission granted to share info w Relationship: daughter     Emotional Assessment Appearance:: Appears stated age     Orientation: : Oriented to Self,Oriented to Place,Oriented to  Time Alcohol / Substance Use: Not Applicable Psych Involvement: No (comment)  Admission diagnosis:  Acute respiratory failure with hypoxia ([place]) [J96.01] AKI (acute kidney injury) ([place]) [N17.9] Acute respiratory failure due to COVID-19 ([place]) [U07.1, J96.00] Pneumonia due to COVID-19 virus [U07.1, J12.82] Patient Active Problem List   Diagnosis Date Noted  . Pneumonia due to COVID-19 virus 07/10/2020  . History of gout   . Leukopenia   . AKI (acute kidney injury) ([place])   . Dehydration   . Glaucoma   . Anxiety with depression   . Acute respiratory failure with hypoxia ([place])   . S/P laparoscopic appendectomy 12/13/2017  . Acute appendicitis   . Lesion of pancreas 04/24/2017  . Gastritis due to nonsteroidal anti-inflammatory drug (NSAID)   . Dysphagia 07/26/2016  . Prostate cancer ([place]) 06/27/2016  . Fall at home 03/15/2016  . Tibial plateau fracture, right 03/15/2016  . Inability to walk 03/15/2016  . Right medial tibial plateau fracture 03/15/2016  . Multiple sclerosis ([place])   . Fall   . Diabetes type 2, controlled ([place]) 10/05/2015  . Gout 10/05/2015  . Fatty liver 06/24/2015  . Hx of adenomatous colonic polyps  03/31/2015  . FH: colon cancer 03/31/2015  . Prediabetes 12/04/2013  . Hyperlipemia 03/11/2013  . Hyperglycemia 03/11/2013  . Stress fracture 01/14/2013  . Knee pain, chronic 01/14/2013  . RECTAL BLEEDING 02/15/2009  . HIV (human immunodeficiency virus infection) ([place]) 02/14/2009  . UNSPECIFIED VISUAL LOSS 02/14/2009  . Essential hypertension 02/14/2009  . CONSTIPATION 02/14/2009  . ABDOMINAL PAIN 02/14/2009  . Elevated transaminase level 02/14/2009  . HX OF GALLSTONE 02/14/2009   PCP:  Kathyrn Drown, MD Pharmacy:   [place], [place] - [address] [place] [address] Phone: [phone] Fax: [phone]     Social Determinants of Health (SDOH) Interventions    Readmission Risk Interventions No flowsheet data found.

## 2020-07-13 LAB — COMPREHENSIVE METABOLIC PANEL
ALT: 36 U/L (ref 0–44)
AST: 51 U/L — ABNORMAL HIGH (ref 15–41)
Albumin: 3.2 g/dL — ABNORMAL LOW (ref 3.5–5.0)
Alkaline Phosphatase: 45 U/L (ref 38–126)
Anion gap: 9 (ref 5–15)
BUN: 31 mg/dL — ABNORMAL HIGH (ref 8–23)
CO2: 20 mmol/L — ABNORMAL LOW (ref 22–32)
Calcium: 8.5 mg/dL — ABNORMAL LOW (ref 8.9–10.3)
Chloride: 115 mmol/L — ABNORMAL HIGH (ref 98–111)
Creatinine, Ser: 1.08 mg/dL (ref 0.61–1.24)
GFR, Estimated: >60 mL/min (ref >60)
Glucose, Bld: 147 mg/dL — ABNORMAL HIGH (ref 70–99)
Potassium: 4.1 mmol/L (ref 3.5–5.1)
Sodium: 144 mmol/L (ref 135–145)
Total Bilirubin: 0.6 mg/dL (ref 0.3–1.2)
Total Protein: 6.5 g/dL (ref 6.5–8.1)

## 2020-07-13 LAB — CBC WITH DIFFERENTIAL/PLATELET
Abs Immature Granulocytes: 0.03 10*3/uL (ref 0.00–0.07)
Basophils Absolute: 0 10*3/uL (ref 0.0–0.1)
Basophils Relative: 0 %
Eosinophils Absolute: 0 10*3/uL (ref 0.0–0.5)
Eosinophils Relative: 0 %
HCT: 42.1 % (ref 39.0–52.0)
Hemoglobin: 13.1 g/dL (ref 13.0–17.0)
Immature Granulocytes: 1 %
Lymphocytes Relative: 9 %
Lymphs Abs: 0.3 10*3/uL — ABNORMAL LOW (ref 0.7–4.0)
MCH: 27.6 pg (ref 26.0–34.0)
MCHC: 31.1 g/dL (ref 30.0–36.0)
MCV: 88.8 fL (ref 80.0–100.0)
Monocytes Absolute: 0.3 10*3/uL (ref 0.1–1.0)
Monocytes Relative: 10 %
Neutro Abs: 2.7 10*3/uL (ref 1.7–7.7)
Neutrophils Relative %: 80 %
Platelets: 224 10*3/uL (ref 150–400)
RBC: 4.74 MIL/uL (ref 4.22–5.81)
RDW: 14.6 % (ref 11.5–15.5)
WBC: 3.3 10*3/uL — ABNORMAL LOW (ref 4.0–10.5)
nRBC: 0 % (ref 0.0–0.2)

## 2020-07-13 LAB — URINE CULTURE: Culture: 20000 — AB

## 2020-07-13 LAB — GLUCOSE, CAPILLARY
Glucose-Capillary: 117 mg/dL — ABNORMAL HIGH (ref 70–99)
Glucose-Capillary: 116 mg/dL — ABNORMAL HIGH (ref 70–99)

## 2020-07-13 LAB — C-REACTIVE PROTEIN: CRP: <0.5 mg/dL (ref <1.0)

## 2020-07-13 LAB — FERRITIN: Ferritin: 546 ng/mL — ABNORMAL HIGH (ref 24–336)

## 2020-07-13 LAB — D-DIMER, QUANTITATIVE: D-Dimer, Quant: 0.46 ug/mL-FEU (ref 0.00–0.50)

## 2020-07-13 LAB — MAGNESIUM: Magnesium: 2.3 mg/dL (ref 1.7–2.4)

## 2020-07-13 LAB — PHOSPHORUS: Phosphorus: 3.1 mg/dL (ref 2.5–4.6)

## 2020-07-13 MED ORDER — ASCORBIC ACID 500 MG PO TABS: 500.0000 mg | ORAL_TABLET | Freq: Every day | ORAL | 0 refills | Status: AC

## 2020-07-13 MED ORDER — PREDNISONE 10 MG PO TABS: ORAL_TABLET | ORAL | 0 refills | Status: AC

## 2020-07-13 MED ORDER — GUAIFENESIN-DM 100-10 MG/5ML PO SYRP: 10.0000 mL | ORAL_SOLUTION | ORAL | 0 refills | Status: DC | PRN

## 2020-07-13 MED ORDER — ZINC SULFATE 220 (50 ZN) MG PO CAPS: 220.0000 mg | ORAL_CAPSULE | Freq: Every day | ORAL | 0 refills | Status: AC

## 2020-07-13 NOTE — Evaluation (Signed)
Physical Therapy Evaluation Patient Details Name: Kerry Solis MRN: [id number] DOB: 1945-12-14 Today's Date: 07/13/2020   History of Present Illness  Kerry Solis is a 75 y.o. male with medical history significant of anxiety, depression, osteoarthritis, cryptococcal meningitis, glaucoma, hypertriglyceridemia, gout, HIV infection, hypertension, leukopenia, multiple sclerosis, CAD, history of MI, prostate cancer who is brought to the emergency department due to waxing and waning confusion for the past week with  fever today associated with dyspnea, myalgias, fatigue, decreased appetite and diarrhea.  He denies dyspnea, wheezing or hemoptysis.  No chest pain, palpitations, diaphoresis, PND, orthopnea or pitting edema of the lower extremities.  Denies abdominal pain, constipation, melena or hematochezia.  No dysuria, frequency hematuria.  No polyuria, polydipsia, polyphagia or blurred vision..    Clinical Impression  Patient functioning at baseline for functional mobility and gait, demonstrating slow labored unsteady cadence without use of AD with occasional leaning on nearby objects for support, no loss of balance and tolerated sitting up in chair after therapy.  Plan:  Patient discharged from physical therapy to care of nursing for ambulation daily as tolerated for length of stay.     Follow Up Recommendations Home health PT;Supervision - Intermittent    Equipment Recommendations  None recommended by PT    Recommendations for Other Services       Precautions / Restrictions Precautions Precautions: Fall Restrictions Weight Bearing Restrictions: No      Mobility  Bed Mobility Overal bed mobility: Modified Independent             General bed mobility comments: increased time    Transfers Overall transfer level: Modified independent               General transfer comment: increased time  Ambulation/Gait Ambulation/Gait assistance: Supervision Gait Distance (Feet):  25 Feet Assistive device: None Gait Pattern/deviations: Decreased step length - right;Decreased step length - left;Decreased stride length;Ataxic Gait velocity: decreased   General Gait Details: slow labored cadence,occasionally  has to lean on nearby objects for support, no loss of balance  Stairs            Wheelchair Mobility    Modified Rankin (Stroke Patients Only)       Balance Overall balance assessment: Mild deficits observed, not formally tested                                           Pertinent Vitals/Pain Pain Assessment: No/denies pain    Home Living Family/patient expects to be discharged to:: Private residence Living Arrangements: Other relatives Available Help at Discharge: Family;Available PRN/intermittently Type of Home: House Home Access: Stairs to enter;Ramped entrance   Entrance Stairs-Number of Steps: 4 in back Home Layout: Able to live on main level with bedroom/bathroom;Laundry or work area in basement;Two level Home Equipment: Grab bars - toilet;Grab bars - tub/shower;Cane - single point;Walker - 2 wheels;Shower seat;Wheelchair - manual;Bedside commode      Prior Function Level of Independence: Independent with assistive device(s);Needs assistance   Gait / Transfers Assistance Needed: household ambulator without AD, uses [facility] for short distanced community  ADL's / Homemaking Assistance Needed: assisted by family        Hand Dominance        Extremity/Trunk Assessment   Upper Extremity Assessment Upper Extremity Assessment: Overall WFL for tasks assessed    Lower Extremity Assessment Lower Extremity Assessment: Generalized weakness  Cervical / Trunk Assessment Cervical / Trunk Assessment: Normal  Communication   Communication: No difficulties  Cognition Arousal/Alertness: Awake/alert Behavior During Therapy: WFL for tasks assessed/performed;Impulsive Overall Cognitive Status: Within Functional Limits for  tasks assessed                                        General Comments      Exercises     Assessment/Plan    PT Assessment All further PT needs can be met in the next venue of care  PT Problem List Decreased strength;Decreased activity tolerance;Decreased balance;Decreased mobility       PT Treatment Interventions      PT Goals (Current goals can be found in the Care Plan section)  Acute Rehab PT Goals Patient Stated Goal: return home with family to assist PT Goal Formulation: With patient Time For Goal Achievement: 07/13/20 Potential to Achieve Goals: Good    Frequency     Barriers to discharge        Co-evaluation               AM-PAC PT "6 Clicks" Mobility  Outcome Measure Help needed turning from your back to your side while in a flat bed without using bedrails?: None Help needed moving from lying on your back to sitting on the side of a flat bed without using bedrails?: None Help needed moving to and from a bed to a chair (including a wheelchair)?: A Little Help needed standing up from a chair using your arms (e.g., wheelchair or bedside chair)?: None Help needed to walk in hospital room?: A Little Help needed climbing 3-5 steps with a railing? : A Lot 6 Click Score: 20    End of Session   Activity Tolerance: Patient tolerated treatment well;Patient limited by fatigue Patient left: in chair;with call bell/phone within reach Nurse Communication: Mobility status PT Visit Diagnosis: Unsteadiness on feet (R26.81);Other abnormalities of gait and mobility (R26.89);Muscle weakness (generalized) (M62.81)    Time: 4237-0230 PT Time Calculation (min) (ACUTE ONLY): 19 min   Charges:   PT Evaluation $PT Eval Low Complexity: 1 Low PT Treatments $Therapeutic Activity: 8-22 mins        12:38 PM, 07/13/20 Lonell Grandchild, MPT Physical Therapist with [facility] 336 [phone] office [phone] mobile phone

## 2020-07-13 NOTE — Discharge Summary (Signed)
Physician Discharge Summary  Kerry Solis [id number] DOB: May 25, 1946 DOA: 07/10/2020  PCP: Kathyrn Drown, MD  Admit date: 07/10/2020  Discharge date: 07/13/2020  Admitted From:Home  Disposition:  Home  Recommendations for Outpatient Follow-up:  1. Follow up with PCP in 1-2 weeks 2. Continue on steroid taper as prescribed resume home medications as prior  Home Health: Home health physical therapy.  Equipment/Devices: None  Discharge Condition:Stable  CODE STATUS: Full  Diet recommendation: Heart Healthy  Brief/Interim Summary: As per H&P written by Dr. Olevia Bowens on 07/10/2020 Early Chars Simpsonis a 75 y.o.malewith medical history significant ofanxiety, depression, osteoarthritis, cryptococcal meningitis, glaucoma, hypertriglyceridemia, gout, HIV infection, hypertension, leukopenia, multiple sclerosis, CAD, history of MI, prostate cancer who is brought to the emergency department due to waxing and waning confusion for the past weekwithfever today associated withdyspnea,myalgias,fatigue, decreased appetite and diarrhea. He denies dyspnea, wheezing or hemoptysis. No chest pain, palpitations, diaphoresis, PND, orthopnea or pitting edema of the lower extremities. Denies abdominal pain, constipation, melena or hematochezia. No dysuria, frequency hematuria. No polyuria, polydipsia, polyphagia or blurred vision.  -Patient was noted to be positive for COVID-19 and was admitted with acute hypoxemic respiratory failure that was related to this.  This is in the setting of his HIV infection and ID was consulted regarding use of remdesivir which was approved.  He had received a total of 3 days of remdesivir treatment and does not require any further doses.  He was also started on IV steroids with Solu-Medrol which will not be transition to prednisone on discharge with taper as prescribed.  Finally, he was noted to have AKI which has resolved and was seen by physical therapy with  recommendation for home health PT which has been ordered as well.  No other acute events noted during the course of this admission and he is overall stable for discharge.  Discharge Diagnoses:  Principal Problem:   Pneumonia due to COVID-19 virus Active Problems:   HIV (human immunodeficiency virus infection) ([place])   Essential hypertension   Hyperlipemia   Diabetes type 2, controlled ([place])   Gout   History of gout   Leukopenia   AKI (acute kidney injury) ([place])   Dehydration   Glaucoma   Anxiety with depression   Acute respiratory failure with hypoxia ([place])  Principal discharge diagnosis: Acute hypoxemic respiratory failure secondary to COVID-19 pneumonia.  Discharge Instructions  Discharge Instructions    Diet - low sodium heart healthy   Complete by: As directed    Increase activity slowly   Complete by: As directed      Allergies as of 07/13/2020      Reactions   Yellow Jacket Venom Swelling      Medication List    TAKE these medications   acetaminophen 325 MG tablet Commonly known as: TYLENOL Take 325 mg by mouth every 4 (four) hours as needed for mild pain.   albuterol 108 (90 Base) MCG/ACT inhaler Commonly known as: VENTOLIN HFA Inhale 1-2 puffs into the lungs every 4 (four) hours as needed for wheezing or shortness of breath.   allopurinol 100 MG tablet Commonly known as: ZYLOPRIM TAKE (1) TABLET BY MOUTH ONCE DAILY FOR GOUT.   ALPRAZolam 0.25 MG tablet Commonly known as: XANAX TAKE 1 TABLET TWICE DAILY AS NEEDED FOR ANXIETY   amitriptyline 25 MG tablet Commonly known as: ELAVIL Take 25 mg by mouth at bedtime.   amLODipine 5 MG tablet Commonly known as: NORVASC TAKE (1) TABLET BY MOUTH DAILY FOR HIGH BLOOD  PRESSURE. What changed: See the new instructions.   ascorbic acid 500 MG tablet Commonly known as: VITAMIN C Take 1 tablet (500 mg total) by mouth daily for 15 days. Start taking on: July 14, 2020   aspirin EC 81 MG tablet Take 81 mg by  mouth daily. Swallow whole.   atorvastatin 10 MG tablet Commonly known as: LIPITOR Take 10 mg by mouth daily.   baclofen 10 MG tablet Commonly known as: LIORESAL Take 10 mg by mouth.   Descovy 200-25 MG tablet Generic drug: emtricitabine-tenofovir AF Take 1 tablet by mouth daily.   dolutegravir 50 MG tablet Commonly known as: TIVICAY Take 50 mg by mouth daily.   folic acid A999333 MCG tablet Commonly known as: FOLVITE Take by mouth.   gabapentin 100 MG capsule Commonly known as: NEURONTIN Take 100 mg by mouth 3 (three) times daily.   guaiFENesin-dextromethorphan 100-10 MG/5ML syrup Commonly known as: ROBITUSSIN DM Take 10 mLs by mouth every 4 (four) hours as needed for cough.   HYDROcodone-acetaminophen 5-325 MG tablet Commonly known as: NORCO/VICODIN Take 1 tablet by mouth every 4 (four) hours as needed for moderate pain.   latanoprost 0.005 % ophthalmic solution Commonly known as: XALATAN Place 1 drop at bedtime into both eyes.   lisinopril-hydrochlorothiazide 10-12.5 MG tablet Commonly known as: ZESTORETIC daily.   ondansetron 8 MG disintegrating tablet Commonly known as: ZOFRAN-ODT Take by mouth.   pantoprazole 40 MG tablet Commonly known as: PROTONIX TAKE 1 TABLET BY MOUTH ONCE DAILY.   polyethylene glycol powder 17 GM/SCOOP powder Commonly known as: GLYCOLAX/MIRALAX Take by mouth.   potassium chloride SA 20 MEQ tablet Commonly known as: KLOR-CON Take 20 mEq by mouth daily.   pravastatin 40 MG tablet Commonly known as: PRAVACHOL Take 1 tablet (40 mg total) by mouth daily.   predniSONE 10 MG tablet Commonly known as: DELTASONE Take 4 tablets (40 mg total) by mouth 2 (two) times daily with a meal for 3 days, THEN 2 tablets (20 mg total) 2 (two) times daily with a meal for 3 days, THEN 1 tablet (10 mg total) 2 (two) times daily with a meal for 3 days. Start taking on: July 13, 2020   sertraline 50 MG tablet Commonly known as: ZOLOFT Take 1 tablet  (50 mg total) by mouth daily.   tamsulosin 0.4 MG Caps capsule Commonly known as: FLOMAX TAKE 1 CAPSULE BY MOUTH AT BEDTIME FOR PROSTATE URINE FLOW What changed: See the new instructions.   vitamin B-6 25 MG tablet Commonly known as: pyridOXINE Take by mouth at bedtime.   Vitamin D3 10 MCG (400 UNIT) Caps Take by mouth.   zinc sulfate 220 (50 Zn) MG capsule Take 1 capsule (220 mg total) by mouth daily for 15 days. Start taking on: July 14, 2020       Follow-up Information    [place], Elayne Snare, MD Follow up in 1 week(s).   Specialty: Family Medicine Contact information: [place] [address] [phone]              Allergies  Allergen Reactions  . Yellow Jacket Venom Swelling    Consultations:  None   Procedures/Studies: DG Chest Portable 1 View  Result Date: 07/10/2020 CLINICAL DATA:  Short of breath, fever, confusion EXAM: PORTABLE CHEST 1 VIEW COMPARISON:  03/14/2020 FINDINGS: Single frontal view of the chest demonstrates a stable cardiac silhouette. Chronic elevation right hemidiaphragm. Stable background scarring without airspace disease, effusion, or pneumothorax. No acute bony  abnormality. IMPRESSION: 1. No acute intrathoracic process. Electronically Signed   By: Randa Ngo M.D.   On: 07/10/2020 18:20      Discharge Exam: Vitals:   07/12/20 1642 07/12/20 2034  BP: 135/64 115/78  Pulse: 70 68  Resp: 18 17  Temp: 97.8 F (36.6 C) 98.2 F (36.8 C)  SpO2: 94% 96%   Vitals:   07/12/20 0034 07/12/20 0447 07/12/20 1642 07/12/20 2034  BP: 129/65 111/62 135/64 115/78  Pulse: 60 62 70 68  Resp: 18 18 18 17   Temp: 98.7 F (37.1 C) 98.2 F (36.8 C) 97.8 F (36.6 C) 98.2 F (36.8 C)  TempSrc:   Oral Oral  SpO2: 100% 94% 94% 96%  Weight:      Height:        General: Pt is alert, awake, not in acute distress Cardiovascular: RRR, S1/S2 +, no rubs, no gallops Respiratory: CTA bilaterally, no wheezing, no  rhonchi Abdominal: Soft, NT, ND, bowel sounds + Extremities: no edema, no cyanosis    The results of significant diagnostics from this hospitalization (including imaging, microbiology, ancillary and laboratory) are listed below for reference.     Microbiology: Recent Results (from the past 240 hour(s))  Blood Culture (routine x 2)     Status: None (Preliminary result)   Collection Time: 07/10/20  4:58 PM   Specimen: Right Antecubital; Blood  Result Value Ref Range Status   Specimen Description RIGHT ANTECUBITAL  Final   Special Requests   Final    BOTTLES DRAWN AEROBIC AND ANAEROBIC Blood Culture adequate volume   Culture   Final    NO GROWTH 3 DAYS Performed at [facility], [address], [place], [place] [address]    Report Status PENDING  Incomplete  Blood Culture (routine x 2)     Status: None (Preliminary result)   Collection Time: 07/10/20  8:05 PM   Specimen: Left Antecubital; Blood  Result Value Ref Range Status   Specimen Description LEFT ANTECUBITAL  Final   Special Requests   Final    BOTTLES DRAWN AEROBIC AND ANAEROBIC Blood Culture adequate volume   Culture   Final    NO GROWTH 3 DAYS Performed at [facility], [address] [place], [place] [address]    Report Status PENDING  Incomplete  Urine culture     Status: Abnormal   Collection Time: 07/11/20 12:00 AM   Specimen: In/Out Cath Urine  Result Value Ref Range Status   Specimen Description   Final    IN/OUT CATH URINE Performed at [facility], [address] [place], [place] [address]    Special Requests   Final    NONE Performed at [facility], [address], [place], [place] [address]    Culture 20,000 COLONIES/mL STAPHYLOCOCCUS EPIDERMIDIS (A)  Final   Report Status 07/13/2020 FINAL  Final   Organism ID, Bacteria STAPHYLOCOCCUS EPIDERMIDIS (A)  Final      Susceptibility   Staphylococcus epidermidis - MIC*    CIPROFLOXACIN <=0.5 SENSITIVE Sensitive     GENTAMICIN <=0.5 SENSITIVE Sensitive      NITROFURANTOIN 32 SENSITIVE Sensitive     OXACILLIN <=0.25 SENSITIVE Sensitive     TETRACYCLINE <=1 SENSITIVE Sensitive     VANCOMYCIN 1 SENSITIVE Sensitive     TRIMETH/SULFA <=10 SENSITIVE Sensitive     CLINDAMYCIN RESISTANT Resistant     RIFAMPIN <=0.5 SENSITIVE Sensitive     Inducible Clindamycin POSITIVE Resistant     * 20,000 COLONIES/mL STAPHYLOCOCCUS EPIDERMIDIS  Labs: BNP (last 3 results) Recent Labs    07/10/20 2002  BNP 12.4   Basic Metabolic Panel: Recent Labs  Lab 07/10/20 1730 07/11/20 0719 07/12/20 0431 07/13/20 0553  NA 139 140 143 144  K 3.6 3.9 4.0 4.1  CL 106 110 115* 115*  CO2 20* 20* 21* 20*  GLUCOSE 106* 173* 174* 147*  BUN 36* 42* 34* 31*  CREATININE 2.00* 1.68* 1.21 1.08  CALCIUM 8.7* 8.3* 8.5* 8.5*  MG  --  2.3 2.2 2.3  PHOS  --  4.8* 3.1 3.1   Liver Function Tests: Recent Labs  Lab 07/10/20 1730 07/11/20 0719 07/12/20 0431 07/13/20 0553  AST 65* 49* 47* 51*  ALT 37 32 34 36  ALKPHOS 58 52 46 45  BILITOT 0.6 0.5 0.4 0.6  PROT 8.2* 6.8 6.5 6.5  ALBUMIN 4.2 3.3* 3.2* 3.2*   No results for input(s): LIPASE, AMYLASE in the last 168 hours. No results for input(s): AMMONIA in the last 168 hours. CBC: Recent Labs  Lab 07/10/20 1730 07/11/20 0719 07/12/20 0431 07/13/20 0553  WBC 2.7* 1.5* 1.3* 3.3*  NEUTROABS 2.0 0.6* 0.7* 2.7  HGB 14.5 13.3 12.7* 13.1  HCT 45.8 42.9 40.6 42.1  MCV 89.1 89.9 88.1 88.8  PLT 149* 146* 187 224   Cardiac Enzymes: No results for input(s): CKTOTAL, CKMB, CKMBINDEX, TROPONINI in the last 168 hours. BNP: Invalid input(s): POCBNP CBG: Recent Labs  Lab 07/12/20 1249 07/12/20 1646 07/12/20 2027 07/13/20 0720 07/13/20 1056  GLUCAP 143* 146* 136* 117* 116*   D-Dimer Recent Labs    07/12/20 0431 07/13/20 0553  DDIMER 0.46 0.46   Hgb A1c No results for input(s): HGBA1C in the last 72 hours. Lipid Profile No results for input(s): CHOL, HDL, LDLCALC, TRIG, CHOLHDL, LDLDIRECT in the last 72  hours. Thyroid function studies No results for input(s): TSH, T4TOTAL, T3FREE, THYROIDAB in the last 72 hours.  Invalid input(s): FREET3 Anemia work up Recent Labs    07/12/20 0431 07/13/20 0553  FERRITIN 563* 546*   Urinalysis    Component Value Date/Time   COLORURINE YELLOW 07/11/2020 0000   APPEARANCEUR HAZY (A) 07/11/2020 0000   LABSPEC 1.019 07/11/2020 0000   PHURINE 5.0 07/11/2020 0000   GLUCOSEU NEGATIVE 07/11/2020 0000   HGBUR SMALL (A) 07/11/2020 0000   BILIRUBINUR NEGATIVE 07/11/2020 0000   BILIRUBINUR ++ 10/31/2012 1533   KETONESUR NEGATIVE 07/11/2020 0000   PROTEINUR >=300 (A) 07/11/2020 0000   UROBILINOGEN 0.2 10/28/2012 1444   NITRITE NEGATIVE 07/11/2020 0000   LEUKOCYTESUR NEGATIVE 07/11/2020 0000   Sepsis Labs Invalid input(s): PROCALCITONIN,  WBC,  LACTICIDVEN Microbiology Recent Results (from the past 240 hour(s))  Blood Culture (routine x 2)     Status: None (Preliminary result)   Collection Time: 07/10/20  4:58 PM   Specimen: Right Antecubital; Blood  Result Value Ref Range Status   Specimen Description RIGHT ANTECUBITAL  Final   Special Requests   Final    BOTTLES DRAWN AEROBIC AND ANAEROBIC Blood Culture adequate volume   Culture   Final    NO GROWTH 3 DAYS Performed at [facility], [address] [place], [place] [address]    Report Status PENDING  Incomplete  Blood Culture (routine x 2)     Status: None (Preliminary result)   Collection Time: 07/10/20  8:05 PM   Specimen: Left Antecubital; Blood  Result Value Ref Range Status   Specimen Description LEFT ANTECUBITAL  Final   Special Requests   Final  BOTTLES DRAWN AEROBIC AND ANAEROBIC Blood Culture adequate volume   Culture   Final    NO GROWTH 3 DAYS Performed at [facility], [name], [place], [place] [address]    Report Status PENDING  Incomplete  Urine culture     Status: Abnormal   Collection Time: 07/11/20 12:00 AM   Specimen: In/Out Cath Urine  Result Value Ref Range  Status   Specimen Description   Final    IN/OUT CATH URINE Performed at [facility], [address], [place], [place] [address]    Special Requests   Final    NONE Performed at [facility], [address], [place], [place] [address]    Culture 20,000 COLONIES/mL STAPHYLOCOCCUS EPIDERMIDIS (A)  Final   Report Status 07/13/2020 FINAL  Final   Organism ID, Bacteria STAPHYLOCOCCUS EPIDERMIDIS (A)  Final      Susceptibility   Staphylococcus epidermidis - MIC*    CIPROFLOXACIN <=0.5 SENSITIVE Sensitive     GENTAMICIN <=0.5 SENSITIVE Sensitive     NITROFURANTOIN 32 SENSITIVE Sensitive     OXACILLIN <=0.25 SENSITIVE Sensitive     TETRACYCLINE <=1 SENSITIVE Sensitive     VANCOMYCIN 1 SENSITIVE Sensitive     TRIMETH/SULFA <=10 SENSITIVE Sensitive     CLINDAMYCIN RESISTANT Resistant     RIFAMPIN <=0.5 SENSITIVE Sensitive     Inducible Clindamycin POSITIVE Resistant     * 20,000 COLONIES/mL STAPHYLOCOCCUS EPIDERMIDIS     Time coordinating discharge: 35 minutes  SIGNED:   Rodena Goldmann, DO [facility] 07/13/2020, 12:29 PM  If 7PM-7AM, please contact night-coverage [email or web address]

## 2020-07-13 NOTE — TOC Transition Note (Signed)
Transition of Care [facility]) - CM/SW Discharge Note   Patient Details  Name: Kerry Solis MRN: [id number] Date of Birth: 28-Sep-1945  Transition of [facility]) CM/SW Contact:  Ihor Gully, LCSW Phone Number: 07/13/2020, 1:09 PM   Clinical Narrative:    Patient is agreeable to [place]. Referral made to [facility] as they are accepting of patient's insurance.   Final next level of care: [place] Barriers to Discharge: No Barriers Identified   Patient Goals and CMS Choice Patient states their goals for this hospitalization and ongoing recovery are:: return home   Choice offered to / list presented to : Adult Children  Discharge Placement                       Discharge Plan and Services                          HH Arranged: PT HH Agency: [place] Date [facility] Agency Contacted: 07/13/20 Time [place]: 1307 Representative spoke with at [employer]: [place] ([place]) Interventions     Readmission Risk Interventions No flowsheet data found.

## 2020-07-14 ENCOUNTER — Encounter (HOSPITAL_COMMUNITY): Payer: Self-pay | Admitting: Emergency Medicine

## 2020-07-14 ENCOUNTER — Telehealth: Payer: Self-pay | Admitting: *Deleted

## 2020-07-14 ENCOUNTER — Telehealth: Payer: Self-pay | Admitting: Family Medicine

## 2020-07-14 ENCOUNTER — Other Ambulatory Visit: Payer: Self-pay

## 2020-07-14 DIAGNOSIS — R059 Cough, unspecified: Secondary | ICD-10-CM | POA: Diagnosis present

## 2020-07-14 DIAGNOSIS — R2681 Unsteadiness on feet: Secondary | ICD-10-CM | POA: Diagnosis not present

## 2020-07-14 DIAGNOSIS — R9082 White matter disease, unspecified: Secondary | ICD-10-CM | POA: Insufficient documentation

## 2020-07-14 DIAGNOSIS — R5381 Other malaise: Secondary | ICD-10-CM | POA: Diagnosis not present

## 2020-07-14 DIAGNOSIS — Z8616 Personal history of COVID-19: Secondary | ICD-10-CM | POA: Insufficient documentation

## 2020-07-14 DIAGNOSIS — E119 Type 2 diabetes mellitus without complications: Secondary | ICD-10-CM | POA: Diagnosis not present

## 2020-07-14 DIAGNOSIS — Z21 Asymptomatic human immunodeficiency virus [HIV] infection status: Secondary | ICD-10-CM | POA: Insufficient documentation

## 2020-07-14 DIAGNOSIS — Z79899 Other long term (current) drug therapy: Secondary | ICD-10-CM | POA: Insufficient documentation

## 2020-07-14 DIAGNOSIS — U071 COVID-19: Secondary | ICD-10-CM | POA: Diagnosis not present

## 2020-07-14 DIAGNOSIS — J1282 Pneumonia due to coronavirus disease 2019: Secondary | ICD-10-CM | POA: Diagnosis not present

## 2020-07-14 DIAGNOSIS — B20 Human immunodeficiency virus [HIV] disease: Secondary | ICD-10-CM | POA: Diagnosis not present

## 2020-07-14 DIAGNOSIS — E1165 Type 2 diabetes mellitus with hyperglycemia: Secondary | ICD-10-CM | POA: Diagnosis not present

## 2020-07-14 DIAGNOSIS — I2510 Atherosclerotic heart disease of native coronary artery without angina pectoris: Secondary | ICD-10-CM | POA: Diagnosis not present

## 2020-07-14 DIAGNOSIS — Z7982 Long term (current) use of aspirin: Secondary | ICD-10-CM | POA: Insufficient documentation

## 2020-07-14 DIAGNOSIS — R4182 Altered mental status, unspecified: Secondary | ICD-10-CM | POA: Insufficient documentation

## 2020-07-14 DIAGNOSIS — R918 Other nonspecific abnormal finding of lung field: Secondary | ICD-10-CM | POA: Diagnosis not present

## 2020-07-14 DIAGNOSIS — I10 Essential (primary) hypertension: Secondary | ICD-10-CM | POA: Diagnosis not present

## 2020-07-14 DIAGNOSIS — R531 Weakness: Secondary | ICD-10-CM | POA: Diagnosis not present

## 2020-07-14 DIAGNOSIS — R630 Anorexia: Secondary | ICD-10-CM | POA: Insufficient documentation

## 2020-07-14 DIAGNOSIS — M6281 Muscle weakness (generalized): Secondary | ICD-10-CM | POA: Insufficient documentation

## 2020-07-14 DIAGNOSIS — J9601 Acute respiratory failure with hypoxia: Secondary | ICD-10-CM | POA: Diagnosis not present

## 2020-07-14 DIAGNOSIS — Z8546 Personal history of malignant neoplasm of prostate: Secondary | ICD-10-CM | POA: Insufficient documentation

## 2020-07-14 DIAGNOSIS — E860 Dehydration: Secondary | ICD-10-CM | POA: Diagnosis not present

## 2020-07-14 DIAGNOSIS — G35 Multiple sclerosis: Secondary | ICD-10-CM | POA: Diagnosis not present

## 2020-07-14 DIAGNOSIS — R2689 Other abnormalities of gait and mobility: Secondary | ICD-10-CM | POA: Insufficient documentation

## 2020-07-14 DIAGNOSIS — I700 Atherosclerosis of aorta: Secondary | ICD-10-CM | POA: Diagnosis not present

## 2020-07-14 NOTE — Telephone Encounter (Signed)
Transition Care Management Unsuccessful Follow-up Telephone Call  Date of discharge and from where:  07/13/2020 - [facility]  Attempts:  1st Attempt  Reason for unsuccessful TCM follow-up call:  Unable to leave message - no VM

## 2020-07-14 NOTE — Telephone Encounter (Signed)
Daughter (Antionette) calling stating patient didn't want to stay in hospital . He demanded to be released and is back home but not eatting or drinking. Daughter is not on HIPPA forms  But has concerns about her dad ,she lives in [place] [phone]. She doesn't know what to do he is Covid positive. Please advise

## 2020-07-14 NOTE — ED Triage Notes (Signed)
Pt c/o generalized weakness for a couple of days. Pt seen pcp yesterday and diagnosed with pneumonia due to covid.

## 2020-07-14 NOTE — Telephone Encounter (Signed)
I tried to call both Numbers in chart listed for the pt -- no answer and no vm.

## 2020-07-15 ENCOUNTER — Emergency Department (HOSPITAL_COMMUNITY)
Admission: EM | Admit: 2020-07-15 | Discharge: 2020-07-20 | Disposition: A | Discharge status: Home / Self Care | Payer: Medicare HMO | Attending: Emergency Medicine | Admitting: Emergency Medicine

## 2020-07-15 ENCOUNTER — Telehealth: Payer: Self-pay | Admitting: Family Medicine

## 2020-07-15 ENCOUNTER — Emergency Department (HOSPITAL_COMMUNITY): Payer: Medicare HMO

## 2020-07-15 DIAGNOSIS — U071 COVID-19: Secondary | ICD-10-CM

## 2020-07-15 DIAGNOSIS — R4182 Altered mental status, unspecified: Secondary | ICD-10-CM | POA: Diagnosis not present

## 2020-07-15 DIAGNOSIS — R630 Anorexia: Secondary | ICD-10-CM

## 2020-07-15 DIAGNOSIS — I700 Atherosclerosis of aorta: Secondary | ICD-10-CM | POA: Diagnosis not present

## 2020-07-15 DIAGNOSIS — R918 Other nonspecific abnormal finding of lung field: Secondary | ICD-10-CM | POA: Diagnosis not present

## 2020-07-15 LAB — URINALYSIS, ROUTINE W REFLEX MICROSCOPIC
Bacteria, UA: NONE SEEN
Bilirubin Urine: NEGATIVE
Glucose, UA: NEGATIVE mg/dL
Ketones, ur: 5 mg/dL — AB
Leukocytes,Ua: NEGATIVE
Nitrite: NEGATIVE
Protein, ur: >=300 mg/dL — AB
Specific Gravity, Urine: 1.019 (ref 1.005–1.030)
pH: 5 (ref 5.0–8.0)

## 2020-07-15 LAB — COMPREHENSIVE METABOLIC PANEL
ALT: 41 U/L (ref 0–44)
AST: 44 U/L — ABNORMAL HIGH (ref 15–41)
Albumin: 3.7 g/dL (ref 3.5–5.0)
Alkaline Phosphatase: 50 U/L (ref 38–126)
Anion gap: 9 (ref 5–15)
BUN: 24 mg/dL — ABNORMAL HIGH (ref 8–23)
CO2: 21 mmol/L — ABNORMAL LOW (ref 22–32)
Calcium: 8.9 mg/dL (ref 8.9–10.3)
Chloride: 117 mmol/L — ABNORMAL HIGH (ref 98–111)
Creatinine, Ser: 1.32 mg/dL — ABNORMAL HIGH (ref 0.61–1.24)
GFR, Estimated: 56 mL/min — ABNORMAL LOW (ref >60)
Glucose, Bld: 127 mg/dL — ABNORMAL HIGH (ref 70–99)
Potassium: 3.7 mmol/L (ref 3.5–5.1)
Sodium: 147 mmol/L — ABNORMAL HIGH (ref 135–145)
Total Bilirubin: 1 mg/dL (ref 0.3–1.2)
Total Protein: 7.7 g/dL (ref 6.5–8.1)

## 2020-07-15 LAB — CULTURE, BLOOD (ROUTINE X 2)
Culture: NO GROWTH
Culture: NO GROWTH
Special Requests: ADEQUATE
Special Requests: ADEQUATE

## 2020-07-15 LAB — CBC WITH DIFFERENTIAL/PLATELET
Abs Immature Granulocytes: 0.06 10*3/uL (ref 0.00–0.07)
Basophils Absolute: 0 10*3/uL (ref 0.0–0.1)
Basophils Relative: 0 %
Eosinophils Absolute: 0 10*3/uL (ref 0.0–0.5)
Eosinophils Relative: 1 %
HCT: 48.3 % (ref 39.0–52.0)
Hemoglobin: 15 g/dL (ref 13.0–17.0)
Immature Granulocytes: 1 %
Lymphocytes Relative: 16 %
Lymphs Abs: 0.8 10*3/uL (ref 0.7–4.0)
MCH: 28 pg (ref 26.0–34.0)
MCHC: 31.1 g/dL (ref 30.0–36.0)
MCV: 90.3 fL (ref 80.0–100.0)
Monocytes Absolute: 0.8 10*3/uL (ref 0.1–1.0)
Monocytes Relative: 15 %
Neutro Abs: 3.6 10*3/uL (ref 1.7–7.7)
Neutrophils Relative %: 67 %
Platelets: 261 10*3/uL (ref 150–400)
RBC: 5.35 MIL/uL (ref 4.22–5.81)
RDW: 15.3 % (ref 11.5–15.5)
WBC: 5.4 10*3/uL (ref 4.0–10.5)
nRBC: 0 % (ref 0.0–0.2)

## 2020-07-15 MED ORDER — LACTATED RINGERS IV BOLUS
1000.0000 mL | Freq: Once | INTRAVENOUS | Status: AC
  Administered 2020-07-15: 1000 mL via INTRAVENOUS

## 2020-07-15 NOTE — Telephone Encounter (Signed)
Transition Care Management Unsuccessful Follow-up Telephone Call  Date of discharge and from where:  07/13/2020 - [facility]  Attempts:  2nd Attempt  Reason for unsuccessful TCM follow-up call:  Unable to leave message - no VM

## 2020-07-15 NOTE — Discharge Instructions (Addendum)
Kerry Solis was brought to the emergency room by family because of weakness and difficulty with ADL.  He did not meet any admission criteria, therefore he had stayed in the ER while our social work team was able to assist in placement. Patient was never admitted to the hospital after his encounter on 1-28, but he was admitted for covid-19 pneumonia between 1/23-1/26. He had confusion and mild hypoxia at that time.   Discharge Information from that encounter is added to this AVS at the very bottom. Med reconciliation has been printed.  While in the ER, Kerry Solis received his home medications.  He is now on day 12 after his COVID-19 symptoms began. He has not been requiring sedation medications and he is not on oxygen. He is hemodynamically stable. He has Cr of 1.32 and Na is 147.   We recommend that his labs be repeated in 1 week. He needs to hydrate well, as his kidney function was improving from the time of admission to hospital from covid.   PCP: Kathyrn Drown, MD  Admit date: 07/15/2020  Discharge date: 07/20/2020  Admitted From:Home  Disposition: Skilled facility  Recommendations for Outpatient Follow-up:  Follow up with PCP in 1-2 weeks Resume home medications as prior  Home Health: Home health physical therapy.  Equipment/Devices: None  Discharge Condition:Stable  CODE STATUS: Full  Diet recommendation: Heart Healthy  Brief/Interim Summary: As per H&P written by Dr. Olevia Bowens on 07/10/2020 Kerry Solis is a 75 y.o. male with medical history significant of anxiety, depression, osteoarthritis, cryptococcal meningitis, glaucoma, hypertriglyceridemia, gout, HIV infection, hypertension, leukopenia, multiple sclerosis, CAD, history of MI, prostate cancer who is brought to the emergency department due to waxing and waning confusion for the past week with  fever today associated with dyspnea, myalgias, fatigue, decreased appetite and diarrhea.  He denies dyspnea, wheezing or  hemoptysis.  No chest pain, palpitations, diaphoresis, PND, orthopnea or pitting edema of the lower extremities.  Denies abdominal pain, constipation, melena or hematochezia.  No dysuria, frequency hematuria.  No polyuria, polydipsia, polyphagia or blurred vision.  -Patient was noted to be positive for COVID-19 and was admitted with acute hypoxemic respiratory failure that was related to this.  This is in the setting of his HIV infection and ID was consulted regarding use of remdesivir which was approved.  He had received a total of 3 days of remdesivir treatment and does not require any further doses.  He was also started on IV steroids with Solu-Medrol which will not be transition to prednisone on discharge with taper as prescribed.  Finally, he was noted to have AKI which has resolved and was seen by physical therapy with recommendation for home health PT which has been ordered as well.  No other acute events noted during the course of this admission and he is overall stable for discharge.  Discharge Diagnoses:  Active Problems:   * No active hospital problems. *  Principal discharge diagnosis: Acute hypoxemic respiratory failure secondary to COVID-19 pneumonia.  Discharge Instructions   Allergies as of 07/20/2020       Reactions   Yellow Jacket Venom Swelling            Contact information for follow-up providers     Luking, Elayne Snare, MD In 1 week.   Specialty: Family Medicine Contact information: [place] [address] [phone]              Contact information for after-discharge care  [facility] .   Service: Skilled Nursing Contact information: [place] [phone]                    Allergies  Allergen Reactions   Yellow Jacket Venom Swelling    Consultations: None   Procedures/Studies: CT Head Wo Contrast  Result Date:  07/15/2020 CLINICAL DATA:  Mental status change, weakness. Recent diagnosis of COVID pneumonia. History of multiple sclerosis. EXAM: CT HEAD WITHOUT CONTRAST TECHNIQUE: Contiguous axial images were obtained from the base of the skull through the vertex without intravenous contrast. COMPARISON:  Brain MRI from 12/22/2014 FINDINGS: Brain: Faint calcifications along the globus pallidus nuclei bilaterally, probably physiologic. Periventricular white matter and corona radiata hypodensities favor chronic ischemic microvascular white matter disease. Otherwise, the brainstem, cerebellum, cerebral peduncles, thalamus, basal ganglia, basilar cisterns, and ventricular system appear within normal limits. No intracranial hemorrhage, mass lesion, or acute CVA. Vascular: There is atherosclerotic calcification of the cavernous carotid arteries bilaterally. Skull: Unremarkable Sinuses/Orbits: Chronic ethmoid, right maxillary, and right sphenoid sinusitis. Other: No supplemental non-categorized findings. IMPRESSION: 1. No acute intracranial findings. 2. Periventricular white matter and corona radiata hypodensities favor chronic ischemic microvascular white matter disease. 3. Chronic paranasal sinusitis. Electronically Signed   By: Van Clines M.D.   On: 07/15/2020 12:41   DG Chest Port 1 View  Result Date: 07/15/2020 CLINICAL DATA:  COVID EXAM: PORTABLE CHEST 1 VIEW COMPARISON:  July 10, 2020 FINDINGS: There are hazy airspace opacities at the lung bases bilaterally, right worse than left. These have increased since the prior study. There is no pneumothorax. No large pleural effusion. The heart size is stable. Aortic calcifications are noted. There is no acute osseous abnormality. IMPRESSION: Hazy airspace opacities at the lung bases bilaterally, right worse than left, concerning for viral pneumonia. Electronically Signed   By: Constance Holster M.D.   On: 07/15/2020 01:27   DG Chest Portable 1 View  Result Date:  07/10/2020 CLINICAL DATA:  Short of breath, fever, confusion EXAM: PORTABLE CHEST 1 VIEW COMPARISON:  03/14/2020 FINDINGS: Single frontal view of the chest demonstrates a stable cardiac silhouette. Chronic elevation right hemidiaphragm. Stable background scarring without airspace disease, effusion, or pneumothorax. No acute bony abnormality. IMPRESSION: 1. No acute intrathoracic process. Electronically Signed   By: Randa Ngo M.D.   On: 07/10/2020 18:20     Discharge Exam: Vitals:   07/19/20 2307 07/20/20 0804  BP: 128/76 115/69  Pulse: 86 88  Resp: 19 16  Temp:  97.9 F (36.6 C)  SpO2: 99% 99%   Vitals:   07/19/20 1037 07/19/20 1719 07/19/20 2307 07/20/20 0804  BP: 118/75 127/70 128/76 115/69  Pulse: 77 87 86 88  Resp: 18 16 19 16   Temp: 98 F (36.7 C)   97.9 F (36.6 C)  TempSrc: Oral   Oral  SpO2: 97% 100% 99% 99%  Weight:

## 2020-07-15 NOTE — Plan of Care (Signed)
  Problem: Acute Rehab PT Goals(only PT should resolve) Goal: Pt Will Go Supine/Side To Sit Outcome: Progressing Flowsheets (Taken 07/15/2020 1557) Pt will go Supine/Side to Sit: with modified independence Goal: Patient Will Transfer Sit To/From Stand Outcome: Progressing Flowsheets (Taken 07/15/2020 1557) Patient will transfer sit to/from stand:  with modified independence  with supervision Goal: Pt Will Transfer Bed To Chair/Chair To Bed Outcome: Progressing Flowsheets (Taken 07/15/2020 1557) Pt will Transfer Bed to Chair/Chair to Bed:  with modified independence  with supervision Goal: Pt Will Ambulate Outcome: Progressing Flowsheets (Taken 07/15/2020 1557) Pt will Ambulate:  50 feet  with supervision  with cane   3:58 PM, 07/15/20 Lonell Grandchild, MPT Physical Therapist with [facility] [phone] office [phone] mobile phone

## 2020-07-15 NOTE — ED Provider Notes (Signed)
[facility] EMERGENCY DEPARTMENT Provider Note   CSN: [id number] Arrival date & time: 07/14/20  2020   History Chief Complaint  Patient presents with  . Fatigue    Kerry Solis is a 75 y.o. male.  The history is provided by the patient.  He has history of hypertension, diabetes, hyperlipidemia, coronary artery disease, HIV disease and was recently hospitalized for Covid pneumonia and comes in today because of having no appetite and feeling generally weak.  He has a mild cough which is nonproductive and has mild dyspnea.  He denies fever, chills, sweats.  He denies nausea, vomiting, diarrhea.  He denies arthralgias or myalgias.  He denies any urinary difficulty.  He denies any loss of sense of smell or taste.  Unfortunately, he is a very poor historian.  Past Medical History:  Diagnosis Date  . Anxiety   . Arthritis   . 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 Depression   . Elevated liver enzymes   . Glaucoma   . High triglycerides   . History of gout   . HIV (human immunodeficiency virus infection) ([place])   . Hypertension   . Leukopenia    Low CD4  . Multiple sclerosis ([place])    uses a cane  . Myocardial infarction ([place])    45 years ago  . Prostate cancer [facility])     Patient Active Problem List   Diagnosis Date Noted  . Pneumonia due to COVID-19 virus 07/10/2020  . History of gout   . Leukopenia   . AKI (acute kidney injury) ([place])   . Dehydration   . Glaucoma   . Anxiety with depression   . Acute respiratory failure with hypoxia ([place])   . S/P laparoscopic appendectomy 12/13/2017  . Acute appendicitis   . Lesion of pancreas 04/24/2017  . Gastritis due to nonsteroidal anti-inflammatory drug (NSAID)   . Dysphagia 07/26/2016  . Prostate cancer ([place]) 06/27/2016  . Fall at home 03/15/2016  . Tibial plateau fracture, right 03/15/2016  . Inability to walk 03/15/2016  . Right medial tibial plateau fracture 03/15/2016  . Multiple sclerosis ([place])    . Fall   . Diabetes type 2, controlled ([place]) 10/05/2015  . Gout 10/05/2015  . Fatty liver 06/24/2015  . Hx of adenomatous colonic polyps 03/31/2015  . FH: colon cancer 03/31/2015  . Prediabetes 12/04/2013  . Hyperlipemia 03/11/2013  . Hyperglycemia 03/11/2013  . Stress fracture 01/14/2013  . Knee pain, chronic 01/14/2013  . RECTAL BLEEDING 02/15/2009  . HIV (human immunodeficiency virus infection) ([place]) 02/14/2009  . UNSPECIFIED VISUAL LOSS 02/14/2009  . Essential hypertension 02/14/2009  . CONSTIPATION 02/14/2009  . ABDOMINAL PAIN 02/14/2009  . Elevated transaminase level 02/14/2009  . HX OF GALLSTONE 02/14/2009    Past Surgical History:  Procedure Laterality Date  . BIOPSY  08/14/2016   Procedure: BIOPSY;  Surgeon: Danie Binder, MD;  Location: [facility] ENDO SUITE;  Service: Endoscopy;;  gastric bx's  . CHOLECYSTECTOMY  06/10/2011   Procedure: LAPAROSCOPIC CHOLECYSTECTOMY;  Surgeon: Donato Heinz, MD;  Location: [facility] ORS;  Service: General;  Laterality: N/A;  . COLONOSCOPY  03/10/2009   [id number] internal hemorrhoids/6-mm sessile ascending colon polyp/tortuous colon, diverticulosis. TA  . COLONOSCOPY WITH PROPOFOL N/A 04/19/2015   Dr. Oneida Alar: sessile serrated adenoma, surveillance in 5 years   . ESOPHAGOGASTRODUODENOSCOPY  06/23/2008   [id number] gastritis/schatzki ring  . ESOPHAGOGASTRODUODENOSCOPY (EGD) WITH PROPOFOL N/A 08/14/2016   Dr. Oneida Alar: moderate Schatzi's ring s/p dilation,  LA Grade A esophagitis,small hiatal hernia, chronic gastritis (negative H.pylori), one duodenal diverticulum  . LAPAROSCOPIC APPENDECTOMY N/A 12/13/2017   Procedure: APPENDECTOMY LAPAROSCOPIC;  Surgeon: Aviva Signs, MD;  Location: [facility] ORS;  Service: General;  Laterality: N/A;  . NECK SURGERY  unsure   disc  . POLYPECTOMY N/A 04/19/2015   Procedure: POLYPECTOMY;  Surgeon: Danie Binder, MD;  Location: [facility] ORS;  Service: Endoscopy;  Laterality: N/A;  ascending colon  . PROSTATE BIOPSY    .  RADIOACTIVE SEED IMPLANT N/A 11/29/2016   Procedure: RADIOACTIVE SEED IMPLANT/BRACHYTHERAPY IMPLANT;  Surgeon: Franchot Gallo, MD;  Location: [facility];  Service: Urology;  Laterality: N/A;  81 seeds implanted  . SAVORY DILATION N/A 08/14/2016   Procedure: SAVORY DILATION;  Surgeon: Danie Binder, MD;  Location: [facility];  Service: Endoscopy;  Laterality: N/A;       Family History  Problem Relation Age of Onset  . Hypertension Mother   . Heart attack Mother   . Heart attack Father   . Cancer Sister        Breast  . Diabetes Sister   . Diabetes Brother   . Cancer Brother        unknown  . Cancer Brother        colon  . Cancer Sister        breast  . Cancer Brother        prostate  . Cancer Brother        colon    Social History   Tobacco Use  . Smoking status: Never Smoker  . Smokeless tobacco: Never Used  Vaping Use  . Vaping Use: Never used  Substance Use Topics  . Alcohol use: No  . Drug use: No    Home Medications Prior to Admission medications   Medication Sig Start Date End Date Taking? Authorizing Provider  acetaminophen (TYLENOL) 325 MG tablet Take 325 mg by mouth every 4 (four) hours as needed for mild pain. 01/15/20   [provider]  albuterol (VENTOLIN HFA) 108 (90 Base) MCG/ACT inhaler Inhale 1-2 puffs into the lungs every 4 (four) hours as needed for wheezing or shortness of breath.    [provider]  allopurinol (ZYLOPRIM) 100 MG tablet TAKE (1) TABLET BY MOUTH ONCE DAILY FOR GOUT. Patient not taking: No sig reported 06/09/20   Kathyrn Drown, MD  ALPRAZolam (XANAX) 0.25 MG tablet TAKE 1 TABLET TWICE DAILY AS NEEDED FOR ANXIETY Patient not taking: No sig reported 02/23/20   Kathyrn Drown, MD  amitriptyline (ELAVIL) 25 MG tablet Take 25 mg by mouth at bedtime.    [provider]  amLODipine (NORVASC) 5 MG tablet TAKE (1) TABLET BY MOUTH DAILY FOR HIGH BLOOD PRESSURE. Patient taking differently: Take  5 mg by mouth daily. 04/20/20   Kathyrn Drown, MD  ascorbic acid (VITAMIN C) 500 MG tablet Take 1 tablet (500 mg total) by mouth daily for 15 days. 07/14/20 07/29/20  Manuella Ghazi, Pratik D, DO  aspirin EC 81 MG tablet Take 81 mg by mouth daily. Swallow whole.    [provider]  atorvastatin (LIPITOR) 10 MG tablet Take 10 mg by mouth daily. Patient not taking: Reported on 07/11/2020 05/26/09   [provider]  baclofen (LIORESAL) 10 MG tablet Take 10 mg by mouth. Patient not taking: Reported on 07/11/2020 01/15/20   [provider]  Cholecalciferol (VITAMIN D3) 10 MCG (400 UNIT) CAPS Take by mouth.    [provider]  DESCOVY 200-25 MG tablet Take 1 tablet by mouth daily. 02/29/20   [provider]  dolutegravir (TIVICAY) 50 MG tablet Take 50 mg by mouth daily. 11/21/16   [provider]  folic acid (FOLVITE) A999333 MCG tablet Take by mouth. Patient not taking: Reported on 07/11/2020    [provider]  gabapentin (NEURONTIN) 100 MG capsule Take 100 mg by mouth 3 (three) times daily. 06/29/20   [provider]  guaiFENesin-dextromethorphan (ROBITUSSIN DM) 100-10 MG/5ML syrup Take 10 mLs by mouth every 4 (four) hours as needed for cough. 07/13/20   Manuella Ghazi, Pratik D, DO  HYDROcodone-acetaminophen (NORCO/VICODIN) 5-325 MG tablet Take 1 tablet by mouth every 4 (four) hours as needed for moderate pain. Patient not taking: Reported on 07/11/2020 02/10/20   Kathyrn Drown, MD  latanoprost (XALATAN) 0.005 % ophthalmic solution Place 1 drop at bedtime into both eyes.  07/15/14   [provider]  lisinopril-hydrochlorothiazide (ZESTORETIC) 10-12.5 MG tablet daily. 11/12/17   [provider]  ondansetron (ZOFRAN-ODT) 8 MG disintegrating tablet Take by mouth. Patient not taking: Reported on 07/11/2020    [provider]  pantoprazole (PROTONIX) 40 MG tablet TAKE 1 TABLET BY MOUTH ONCE DAILY. Patient taking differently: Take 40 mg by  mouth daily. 04/20/20   Kathyrn Drown, MD  polyethylene glycol powder (GLYCOLAX/MIRALAX) 17 GM/SCOOP powder Take by mouth. 01/15/20   [provider]  potassium chloride SA (KLOR-CON) 20 MEQ tablet Take 20 mEq by mouth daily. Patient not taking: Reported on 07/11/2020    [provider]  pravastatin (PRAVACHOL) 40 MG tablet Take 1 tablet (40 mg total) by mouth daily. 05/26/20   Kathyrn Drown, MD  predniSONE (DELTASONE) 10 MG tablet Take 4 tablets (40 mg total) by mouth 2 (two) times daily with a meal for 3 days, THEN 2 tablets (20 mg total) 2 (two) times daily with a meal for 3 days, THEN 1 tablet (10 mg total) 2 (two) times daily with a meal for 3 days. 07/13/20 07/22/20  Manuella Ghazi, Pratik D, DO  sertraline (ZOLOFT) 50 MG tablet Take 1 tablet (50 mg total) by mouth daily. 05/23/20   Kathyrn Drown, MD  tamsulosin (FLOMAX) 0.4 MG CAPS capsule TAKE 1 CAPSULE BY MOUTH AT BEDTIME FOR PROSTATE URINE FLOW Patient taking differently: Take 0.4 mg by mouth at bedtime. FOR PROSTATE URINE FLOW 05/20/20   Kathyrn Drown, MD  vitamin B-6 (PYRIDOXINE) 25 MG tablet Take by mouth at bedtime. Patient not taking: Reported on 07/11/2020 01/15/20   [provider]  zinc sulfate 220 (50 Zn) MG capsule Take 1 capsule (220 mg total) by mouth daily for 15 days. 07/14/20 07/29/20  Heath Lark D, DO    Allergies    Yellow jacket venom  Review of Systems   Review of Systems  All other systems reviewed and are negative.   Physical Exam Updated Vital Signs BP (!) 132/105   Pulse (!) 115   Temp 98.6 F (37 C)   Resp (!) 23   Wt 90 kg   SpO2 99%   BMI 29.73 kg/m   Physical Exam Vitals and nursing note reviewed.   75 year old male, resting comfortably and in no acute distress. Vital signs are significant for elevated heart rate, respiratory rate, blood pressure. Oxygen saturation is 99%, which is normal. Head is normocephalic and atraumatic. PERRLA, EOMI. Oropharynx is clear. Neck is  nontender and supple without adenopathy or JVD. Back is nontender and  there is no CVA tenderness. Lungs are clear without rales, wheezes, or rhonchi. Chest is nontender. Heart has regular rate and rhythm without murmur. Abdomen is soft, flat, nontender without masses or hepatosplenomegaly and peristalsis is normoactive. Extremities have no cyanosis or edema, full range of motion is present. Skin is warm and dry without rash. Neurologic: Mental status is normal, cranial nerves are intact, there are no motor or sensory deficits.  ED Results / Procedures / Treatments   Labs (all labs ordered are listed, but only abnormal results are displayed) Labs Reviewed  COMPREHENSIVE METABOLIC PANEL - Abnormal; Notable for the following components:      Result Value   Sodium 147 (*)    Chloride 117 (*)    CO2 21 (*)    Glucose, Bld 127 (*)    BUN 24 (*)    Creatinine, Ser 1.32 (*)    AST 44 (*)    GFR, Estimated 56 (*)    All other components within normal limits  URINALYSIS, ROUTINE W REFLEX MICROSCOPIC - Abnormal; Notable for the following components:   Hgb urine dipstick SMALL (*)    Ketones, ur 5 (*)    Protein, ur >=300 (*)    All other components within normal limits  CBC WITH DIFFERENTIAL/PLATELET   Radiology DG Chest Port 1 View  Result Date: 07/15/2020 CLINICAL DATA:  COVID EXAM: PORTABLE CHEST 1 VIEW COMPARISON:  July 10, 2020 FINDINGS: There are hazy airspace opacities at the lung bases bilaterally, right worse than left. These have increased since the prior study. There is no pneumothorax. No large pleural effusion. The heart size is stable. Aortic calcifications are noted. There is no acute osseous abnormality. IMPRESSION: Hazy airspace opacities at the lung bases bilaterally, right worse than left, concerning for viral pneumonia. Electronically Signed   By: Constance Holster M.D.   On: 07/15/2020 01:27    Procedures Procedures   Medications Ordered in ED Medications   lactated ringers bolus 1,000 mL (1,000 mLs Intravenous New Bag/Given 07/15/20 0125)    ED Course  I have reviewed the triage vital signs and the nursing notes.  Pertinent labs & imaging results that were available during my care of the patient were reviewed by me and considered in my medical decision making (see chart for details).  MDM Rules/Calculators/A&P Anorexia and fatigue and patient with Covid infection.  Old records reviewed confirming recent hospitalization for Covid pneumonia, discharged yesterday.  Patient's main complaint really is anorexia.  He is noted to be tachycardic and I suspect he may be dehydrated.  He will be given IV fluids and will check screening labs and chest x-ray.  Chest x-ray does show slightly worsening infiltrates, but he is maintaining good oxygen saturation on room air.  Labs show slight rising creatinine over baseline, likely secondary to dehydration.  Urinalysis shows no sign of infection.  He is felt to be safe for discharge at this point, encouraged to maintain adequate hydration at home.  Return precautions discussed.  Final Clinical Impression(s) / ED Diagnoses Final diagnoses:  [id number] virus infection  Anorexia    Rx / DC Orders ED Discharge Orders    None       Delora Fuel, MD 90/30/09 [phone]

## 2020-07-15 NOTE — ED Notes (Signed)
Pt has pulled his IV out & taken himself off the monitor.

## 2020-07-15 NOTE — ED Notes (Signed)
Patient repeatedly removing BP cuff, pulse ox.

## 2020-07-15 NOTE — ED Notes (Signed)
Pts up for discharge. Have called family to pick him up. No answer, left a message.

## 2020-07-15 NOTE — Telephone Encounter (Signed)
If possible inform family that we have tried to connect with social worker Unfortunately we cannot force the hospital to keep a person We have expressed to them how we feel that the patient is not safe to go home

## 2020-07-15 NOTE — Telephone Encounter (Signed)
It appears that the hospital is doing Education officer, museum consult I agree that Kerry Solis is getting to the point where he cannot care for himself.  I also agree that it would not be safe to send him home.  Social worker consult greatly appreciated. Family should make sure that they talk with the social worker to express what they have noticed and what is going on

## 2020-07-15 NOTE — ED Notes (Signed)
Spoke with Verdis Frederickson whom states that his son is in [place] and she is at work and will need to find someone to come pick him up. Will call back when she figures it out.

## 2020-07-15 NOTE — NC FL2 (Signed)
[place] LEVEL OF CARE SCREENING TOOL     IDENTIFICATION  Patient Name: Kerry Solis Birthdate: [birth year] Sex: male Admission Date (Current Location): 07/15/2020  [facility] and [place] Number:  [employer] and Address:  [facility] [address] [place]      Provider Number: [id number]  Attending Physician Name and Address:  Default, Provider, MD  Relative Name and Phone Number:  Gabriele, [place])   [phone]    Current Level of Care: Hospital Recommended Level of Care: [place] Prior Approval Number:    Date Approved/Denied:   PASRR Number: [id number]  Discharge Plan: SNF    Current Diagnoses: Patient Active Problem List   Diagnosis Date Noted  . Pneumonia due to COVID-19 virus 07/10/2020  . History of gout   . Leukopenia   . AKI (acute kidney injury) ([place])   . Dehydration   . Glaucoma   . Anxiety with depression   . Acute respiratory failure with hypoxia ([place])   . S/P laparoscopic appendectomy 12/13/2017  . Acute appendicitis   . Lesion of pancreas 04/24/2017  . Gastritis due to nonsteroidal anti-inflammatory drug (NSAID)   . Dysphagia 07/26/2016  . Prostate cancer ([place]) 06/27/2016  . Fall at home 03/15/2016  . Tibial plateau fracture, right 03/15/2016  . Inability to walk 03/15/2016  . Right medial tibial plateau fracture 03/15/2016  . Multiple sclerosis ([place])   . Fall   . Diabetes type 2, controlled ([place]) 10/05/2015  . Gout 10/05/2015  . Fatty liver 06/24/2015  . Hx of adenomatous colonic polyps 03/31/2015  . FH: colon cancer 03/31/2015  . Prediabetes 12/04/2013  . Hyperlipemia 03/11/2013  . Hyperglycemia 03/11/2013  . Stress fracture 01/14/2013  . Knee pain, chronic 01/14/2013  . RECTAL BLEEDING 02/15/2009  . HIV (human immunodeficiency virus infection) ([place]) 02/14/2009  . UNSPECIFIED VISUAL LOSS 02/14/2009  . Essential hypertension 02/14/2009  . CONSTIPATION 02/14/2009   . ABDOMINAL PAIN 02/14/2009  . Elevated transaminase level 02/14/2009  . HX OF GALLSTONE 02/14/2009    Orientation RESPIRATION BLADDER Height & Weight     Self,Time,Situation,Place  Normal Continent Weight: 198 lb 6.6 oz (90 kg) Height:     BEHAVIORAL SYMPTOMS/MOOD NEUROLOGICAL BOWEL NUTRITION STATUS      Continent Diet  AMBULATORY STATUS COMMUNICATION OF NEEDS Skin   Limited Assist Verbally Normal                       Personal Care Assistance Level of Assistance  Bathing,Feeding,Dressing,Total care Bathing Assistance: Limited assistance Feeding assistance: Independent Dressing Assistance: Limited assistance Total Care Assistance: Limited assistance   Functional Limitations Info  Sight,Hearing,Speech Sight Info: Adequate Hearing Info: Adequate Speech Info: Adequate    SPECIAL CARE FACTORS FREQUENCY  PT (By licensed PT),OT (By licensed OT)     PT Frequency: 5 times weekly OT Frequency: 5 times weekly            Contractures Contractures Info: Not present    Additional Factors Info  Code Status,Allergies Code Status Info: FULL Allergies Info: [place] Venom           Current Medications (07/15/2020):  This is the current hospital active medication list No current facility-administered medications for this encounter.   Current Outpatient Medications  Medication Sig Dispense Refill  . acetaminophen (TYLENOL) 325 MG tablet Take 325 mg by mouth every 4 (four) hours as needed for mild pain.    Marland Kitchen albuterol (VENTOLIN HFA)  108 (90 Base) MCG/ACT inhaler Inhale 1-2 puffs into the lungs every 4 (four) hours as needed for wheezing or shortness of breath.    . allopurinol (ZYLOPRIM) 100 MG tablet TAKE (1) TABLET BY MOUTH ONCE DAILY FOR GOUT. (Patient taking differently: Take 100 mg by mouth daily.) 28 tablet 11  . ALPRAZolam (XANAX) 0.25 MG tablet TAKE 1 TABLET TWICE DAILY AS NEEDED FOR ANXIETY (Patient not taking: No sig reported) 20 tablet 0  . amitriptyline  (ELAVIL) 25 MG tablet Take 25 mg by mouth at bedtime.    Marland Kitchen amLODipine (NORVASC) 5 MG tablet TAKE (1) TABLET BY MOUTH DAILY FOR HIGH BLOOD PRESSURE. (Patient taking differently: Take 5 mg by mouth daily.) 28 tablet 3  . ascorbic acid (VITAMIN C) 500 MG tablet Take 1 tablet (500 mg total) by mouth daily for 15 days. 15 tablet 0  . aspirin EC 81 MG tablet Take 81 mg by mouth daily. Swallow whole.    Marland Kitchen atorvastatin (LIPITOR) 10 MG tablet Take 10 mg by mouth daily. (Patient not taking: Reported on 07/11/2020)    . baclofen (LIORESAL) 10 MG tablet Take 10 mg by mouth. (Patient not taking: Reported on 07/11/2020)    . Cholecalciferol (VITAMIN D3) 10 MCG (400 UNIT) CAPS Take by mouth.    . DESCOVY 200-25 MG tablet Take 1 tablet by mouth daily.    . dolutegravir (TIVICAY) 50 MG tablet Take 50 mg by mouth daily.    . folic acid (FOLVITE) 607 MCG tablet Take by mouth. (Patient not taking: Reported on 07/11/2020)    . gabapentin (NEURONTIN) 100 MG capsule Take 100 mg by mouth 3 (three) times daily.    Marland Kitchen guaiFENesin-dextromethorphan (ROBITUSSIN DM) 100-10 MG/5ML syrup Take 10 mLs by mouth every 4 (four) hours as needed for cough. 118 mL 0  . HYDROcodone-acetaminophen (NORCO/VICODIN) 5-325 MG tablet Take 1 tablet by mouth every 4 (four) hours as needed for moderate pain. (Patient not taking: Reported on 07/11/2020) 24 tablet 0  . latanoprost (XALATAN) 0.005 % ophthalmic solution Place 1 drop at bedtime into both eyes.     Marland Kitchen lisinopril-hydrochlorothiazide (ZESTORETIC) 10-12.5 MG tablet Take 1 tablet by mouth daily.    . ondansetron (ZOFRAN-ODT) 8 MG disintegrating tablet Take by mouth. (Patient not taking: Reported on 07/11/2020)    . pantoprazole (PROTONIX) 40 MG tablet TAKE 1 TABLET BY MOUTH ONCE DAILY. (Patient taking differently: Take 40 mg by mouth daily.) 28 tablet 3  . polyethylene glycol powder (GLYCOLAX/MIRALAX) 17 GM/SCOOP powder Take by mouth.    . potassium chloride SA (KLOR-CON) 20 MEQ tablet Take 20 mEq  by mouth daily. (Patient not taking: Reported on 07/11/2020)    . pravastatin (PRAVACHOL) 40 MG tablet Take 1 tablet (40 mg total) by mouth daily. 90 tablet 1  . predniSONE (DELTASONE) 10 MG tablet Take 4 tablets (40 mg total) by mouth 2 (two) times daily with a meal for 3 days, THEN 2 tablets (20 mg total) 2 (two) times daily with a meal for 3 days, THEN 1 tablet (10 mg total) 2 (two) times daily with a meal for 3 days. 42 tablet 0  . sertraline (ZOLOFT) 50 MG tablet Take 1 tablet (50 mg total) by mouth daily. 14 tablet 0  . tamsulosin (FLOMAX) 0.4 MG CAPS capsule TAKE 1 CAPSULE BY MOUTH AT BEDTIME FOR PROSTATE URINE FLOW (Patient taking differently: Take 0.4 mg by mouth at bedtime. FOR PROSTATE URINE FLOW) 28 capsule 3  . vitamin B-6 (PYRIDOXINE) 25 MG tablet  Take by mouth at bedtime. (Patient not taking: Reported on 07/11/2020)    . zinc sulfate 220 (50 Zn) MG capsule Take 1 capsule (220 mg total) by mouth daily for 15 days. 15 capsule 0     Discharge Medications: Please see discharge summary for a list of discharge medications.  Relevant Imaging Results:  Relevant Lab Results:   Additional Information SSN: [id number], [place]

## 2020-07-15 NOTE — Telephone Encounter (Signed)
Pt daughter calling and stating that pt is currently at ED and they are wanting to release him. Daughter states that patient is not eating/drinking or taking meds. Hospital is saying pt is hydrated but daughter states that is because he was recently admitted to hospital on 07/10/20 and d/c 07/13/20. Daughter states that is breathing is "like a panting". Pt has dementia. Daughter states he is not well enough to come home and no other family members to sit with pt. Please advise. Thank you

## 2020-07-15 NOTE — Telephone Encounter (Signed)
Social worker contacted and given provider cell number.

## 2020-07-15 NOTE — Telephone Encounter (Signed)
Nurses-if possible allow me to speak with social worker who saw the patient-they can call my cell number

## 2020-07-15 NOTE — Telephone Encounter (Signed)
Daughter (DPR) stated she was told they thought he needed some physical therapy and they were looking into what could be done.

## 2020-07-15 NOTE — ED Notes (Signed)
Spoke with family earlier whom voice concerns that patient is not safe for discharge. States that he is confused, unable to care for himself and lives alone. Daughter reports living in [place] and son is currently in [place]. Dr. Regenia Skeeter made aware and will order SW consult.

## 2020-07-15 NOTE — Telephone Encounter (Signed)
Pt daughter contacted. Daughter states that she spoke with the doctor at the ER and he told her he could not find a reason to keep pt. ER doctor put daughter through to Education officer, museum and she states there is nothing really that can be done at this point. Pt is still at hospital, has been d/c, but has no one to pick him up. (Daughter lives in [place] and all other family members up here are working).

## 2020-07-15 NOTE — Evaluation (Addendum)
Physical Therapy Evaluation Patient Details Name: Kerry Solis MRN: [id number] DOB: [birth year] Today's Date: 07/15/2020   History of Present Illness  Kerry Solis is a 75 y.o. male.     The history is provided by the patient.   He has history of hypertension, diabetes, hyperlipidemia, coronary artery disease, HIV disease and was recently hospitalized for Covid pneumonia and comes in today because of having no appetite and feeling generally weak.  He has a mild cough which is nonproductive and has mild dyspnea.  He denies fever, chills, sweats.  He denies nausea, vomiting, diarrhea.  He denies arthralgias or myalgias.  He denies any urinary difficulty.  He denies any loss of sense of smell or taste.  Unfortunately, he is a very poor historian.    Clinical Impression  Patient demonstrates slow labored movement for sitting up at bedside, very unsteady on feet having to lean on nearby objects for support, no loss of balance and limited secondary to fatigue.  Patient on room air with SpO2 at 95% after walking in room.  Patient will benefit from continued physical therapy in hospital and recommended venue below to increase strength, balance, endurance for safe ADLs and gait.     Follow Up Recommendations SNF;Supervision - Intermittent;Supervision for mobility/OOB    Equipment Recommendations  None recommended by PT    Recommendations for Other Services       Precautions / Restrictions Precautions Precautions: Fall Restrictions Weight Bearing Restrictions: No      Mobility  Bed Mobility Overal bed mobility: Needs Assistance Bed Mobility: Supine to Sit;Sit to Supine     Supine to sit: Independent;Supervision Sit to supine: Supervision   General bed mobility comments: increased time, labored mobile    Transfers Overall transfer level: Needs assistance   Transfers: Sit to/from Stand;Stand Pivot Transfers Sit to Stand: Min guard Stand pivot transfers: Min guard        General transfer comment: increased time, labored movement  Ambulation/Gait Ambulation/Gait assistance: Min guard;Min assist Gait Distance (Feet): 15 Feet   Gait Pattern/deviations: Decreased step length - right;Decreased step length - left;Decreased stride length;Ataxic Gait velocity: decreased   General Gait Details: slow labored cadence,occasionally  has to lean on nearby objects for support, no loss of balance  Stairs            Wheelchair Mobility    Modified Rankin (Stroke Patients Only)       Balance Overall balance assessment: Needs assistance Sitting-balance support: Feet supported;No upper extremity supported Sitting balance-Leahy Scale: Fair Sitting balance - Comments: fair/good seated at EOB   Standing balance support: During functional activity;No upper extremity supported Standing balance-Leahy Scale: Poor Standing balance comment: fairpoor without AD                             Pertinent Vitals/Pain Pain Assessment: No/denies pain    Home Living Family/patient expects to be discharged to:: Private residence Living Arrangements: Other relatives Available Help at Discharge: Family;Available PRN/intermittently Type of Home: House Home Access: Stairs to enter;Ramped entrance   Entrance Stairs-Number of Steps: 4 in back Home Layout: Able to live on main level with bedroom/bathroom;Laundry or work area in basement;Two level Home Equipment: Cane - single point;Grab bars - toilet;Grab bars - tub/shower      Prior Function Level of Independence: Independent with assistive device(s);Needs assistance   Gait / Transfers Assistance Needed: household ambulator without AD, uses [facility] for short distanced community  ADL's /  Homemaking Assistance Needed: assisted by family        Hand Dominance        Extremity/Trunk Assessment   Upper Extremity Assessment Upper Extremity Assessment: Generalized weakness    Lower Extremity  Assessment Lower Extremity Assessment: Generalized weakness    Cervical / Trunk Assessment Cervical / Trunk Assessment: Normal  Communication   Communication: No difficulties  Cognition Arousal/Alertness: Awake/alert Behavior During Therapy: WFL for tasks assessed/performed;Impulsive Overall Cognitive Status: Within Functional Limits for tasks assessed                                        General Comments      Exercises     Assessment/Plan    PT Assessment Patient needs continued PT services  PT Problem List Decreased strength;Decreased activity tolerance;Decreased balance;Decreased mobility       PT Treatment Interventions DME instruction;Gait training;Stair training;Therapeutic activities;Therapeutic exercise;Balance training;Patient/family education    PT Goals (Current goals can be found in the Care Plan section)  Acute Rehab PT Goals Patient Stated Goal: return home with family to assist PT Goal Formulation: With patient Time For Goal Achievement: 07/29/20 Potential to Achieve Goals: Good    Frequency Min 2X/week   Barriers to discharge        Co-evaluation               AM-PAC PT "6 Clicks" Mobility  Outcome Measure Help needed turning from your back to your side while in a flat bed without using bedrails?: None Help needed moving from lying on your back to sitting on the side of a flat bed without using bedrails?: None Help needed moving to and from a bed to a chair (including a wheelchair)?: A Little Help needed standing up from a chair using your arms (e.g., wheelchair or bedside chair)?: A Little Help needed to walk in hospital room?: A Little Help needed climbing 3-5 steps with a railing? : A Lot 6 Click Score: 19    End of Session   Activity Tolerance: Patient tolerated treatment well;Patient limited by fatigue Patient left: in bed;with call bell/phone within reach Nurse Communication: Mobility status PT Visit Diagnosis:  Unsteadiness on feet (R26.81);Other abnormalities of gait and mobility (R26.89);Muscle weakness (generalized) (M62.81)    Time: 6222-9798 PT Time Calculation (min) (ACUTE ONLY): 25 min   Charges:   PT Evaluation $PT Eval Moderate Complexity: 1 Mod PT Treatments $Therapeutic Activity: 23-37 mins        4:05 PM, 07/15/20 Lonell Grandchild, MPT Physical Therapist with [facility] [phone] office [phone] mobile phone

## 2020-07-15 NOTE — ED Notes (Signed)
Called all contacts on patient's list to inquire about who would pick up patient, VML on son's phone. Unable to leave message on others.

## 2020-07-15 NOTE — ED Provider Notes (Signed)
10:11 AM Nurse informs me that family is very worried about patient.  He has been confused and does not seem to be able to take care of himself.  Seems like he is not safe for discharge back to home where he lives alone and family is not local.  Recently got out of the hospital for [place].  Labs today did not show any significant abnormalities that would warrant readmission. He is not febrile and WBC is normal. Chart review shows he's been confused for over 1 week (based on ED admission note). Will get CT head but this probably isn't acute or is related to his covid infection. If CT head negative he will probably need TOC care for placement.   10:25 AM I examined patient and he is pleasantly confused and not ill-appearing.  Fortunately his vitals are okay.  He remembers being admitted but is not sure why he came back to the hospital.  I talked with his daughter over the phone, [place], and she indicates that he has had memory issues and confusion problems for some time, worsening last summer.  She wanted to bring him to [place] but was told he would need 24-hour care no matter where he is.  At this point, I think getting a CT head is still reasonable but I suspect that his confusion is more of a long-term problem, possibly exacerbated by COVID.  If his CT head is negative we will proceed with placement.  12:43 PM CT head is negative for acute disease. PT consult has been ordered.   Sherwood Gambler, MD 07/15/20 [phone]

## 2020-07-15 NOTE — TOC Initial Note (Signed)
Transition of Care [facility]) - Initial/Assessment Note    Patient Details  Name: Kerry Solis MRN: [id number] Date of Birth: 22-Feb-1946  [facility]) CM/SW Contact:    Iona Beard, [place] Phone Number: 07/15/2020, 5:13 PM  Clinical Narrative:                 Pt returns to ED after recent discharge. TOC consulted for possible SNF placement. Per chart review pts daughter does not think it is safe for pt to return to his home. PT is recommending SNF. CSW spoke with pt over the phone to complete assessment. Pt states that his nephew lives with him. Pt is independent in ADLs. Pt does not drive but has someone to provide needed transportation. Pt was set up with Alvis Lemmings for [facility] services at previous hospital admission. Pt has a cane and wheelchair to use when needed. Pt is agreeable to SNF, CSW informed pt that due to being COVID+ there are limited facilities that can accept, pt expressed understanding and agreeable to CSW sending referral to those facilities. TOC to follow.   Expected Discharge Plan: Skilled Nursing Facility Barriers to Discharge: ED SNF auth,ED Unsafe disposition,ED Facility/Family Refusing to Allow Patient to Return   Patient Goals and CMS Choice Patient states their goals for this hospitalization and ongoing recovery are:: Go to SNF CMS Medicare.gov Compare Post Acute Care list provided to:: Patient Choice offered to / list presented to : Patient  Expected Discharge Plan and Services Expected Discharge Plan: [place] In-house Referral: Clinical Social Work Discharge Planning Services: CM Consult Post Acute Care Choice: [place] arrangements for the past 2 months: Single Family Home                 DME Arranged: N/A DME Agency: NA       HH Arranged: NA [place] Agency: NA        Prior Living Arrangements/Services Living arrangements for the past 2 months: [place] Lives with:: Self Patient language and need  for interpreter reviewed:: Yes Do you feel safe going back to the place where you live?: Yes      Need for Family Participation in Patient Care: Yes (Comment) Care giver support system in place?: Yes (comment) Current home services: DME Criminal Activity/Legal Involvement Pertinent to Current Situation/Hospitalization: No - Comment as needed  Activities of Daily Living      Permission Sought/Granted                  Emotional Assessment Appearance:: Appears stated age Attitude/Demeanor/Rapport: Engaged Affect (typically observed): Accepting Orientation: : Oriented to Self,Oriented to Place,Oriented to  Time,Oriented to Situation Alcohol / Substance Use: Not Applicable Psych Involvement: No (comment)  Admission diagnosis:  weakness Patient Active Problem List   Diagnosis Date Noted  . Pneumonia due to COVID-19 virus 07/10/2020  . History of gout   . Leukopenia   . AKI (acute kidney injury) ([place])   . Dehydration   . Glaucoma   . Anxiety with depression   . Acute respiratory failure with hypoxia ([place])   . S/P laparoscopic appendectomy 12/13/2017  . Acute appendicitis   . Lesion of pancreas 04/24/2017  . Gastritis due to nonsteroidal anti-inflammatory drug (NSAID)   . Dysphagia 07/26/2016  . Prostate cancer ([place]) 06/27/2016  . Fall at home 03/15/2016  . Tibial plateau fracture, right 03/15/2016  . Inability to walk 03/15/2016  . Right medial tibial plateau fracture 03/15/2016  . Multiple  sclerosis ([place])   . Fall   . Diabetes type 2, controlled ([place]) 10/05/2015  . Gout 10/05/2015  . Fatty liver 06/24/2015  . Hx of adenomatous colonic polyps 03/31/2015  . FH: colon cancer 03/31/2015  . Prediabetes 12/04/2013  . Hyperlipemia 03/11/2013  . Hyperglycemia 03/11/2013  . Stress fracture 01/14/2013  . Knee pain, chronic 01/14/2013  . RECTAL BLEEDING 02/15/2009  . HIV (human immunodeficiency virus infection) ([place]) 02/14/2009  . UNSPECIFIED VISUAL LOSS 02/14/2009  .  Essential hypertension 02/14/2009  . CONSTIPATION 02/14/2009  . ABDOMINAL PAIN 02/14/2009  . Elevated transaminase level 02/14/2009  . HX OF GALLSTONE 02/14/2009   PCP:  Kathyrn Drown, MD Pharmacy:   [place], [place] - [address] [place] [address] Phone: [phone] Fax: [phone]     Social Determinants of Health (SDOH) Interventions    Readmission Risk Interventions No flowsheet data found.

## 2020-07-16 NOTE — ED Notes (Signed)
Pt resting.

## 2020-07-16 NOTE — ED Notes (Signed)
Pt eating dinner, no distress and cooperative.

## 2020-07-17 DIAGNOSIS — U071 COVID-19: Secondary | ICD-10-CM | POA: Diagnosis not present

## 2020-07-17 MED ORDER — ONDANSETRON 4 MG PO TBDP: 4.0000 mg | ORAL_TABLET | Freq: Three times a day (TID) | ORAL | Status: DC | PRN

## 2020-07-17 MED ORDER — ACETAMINOPHEN 325 MG PO TABS: 325.0000 mg | ORAL_TABLET | ORAL | Status: DC | PRN

## 2020-07-17 MED ORDER — LATANOPROST 0.005 % OP SOLN
1.0000 [drp] | Freq: Every day | OPHTHALMIC | Status: DC
  Administered 2020-07-17 – 2020-07-19 (×3): 1 [drp] via OPHTHALMIC
  Filled 2020-07-17 (×3): qty 2.5

## 2020-07-17 MED ORDER — ATORVASTATIN CALCIUM 10 MG PO TABS
10.0000 mg | ORAL_TABLET | Freq: Every day | ORAL | Status: DC
  Administered 2020-07-17 – 2020-07-20 (×4): 10 mg via ORAL
  Filled 2020-07-17 (×4): qty 1

## 2020-07-17 MED ORDER — PRAVASTATIN SODIUM 40 MG PO TABS
40.0000 mg | ORAL_TABLET | Freq: Every day | ORAL | Status: DC
  Administered 2020-07-18 – 2020-07-20 (×3): 40 mg via ORAL
  Filled 2020-07-17 (×3): qty 1

## 2020-07-17 MED ORDER — GABAPENTIN 100 MG PO CAPS
100.0000 mg | ORAL_CAPSULE | Freq: Three times a day (TID) | ORAL | Status: DC
Start: 2020-07-17 — End: 2020-07-20
  Administered 2020-07-17 – 2020-07-20 (×8): 100 mg via ORAL
  Filled 2020-07-17 (×8): qty 1

## 2020-07-17 MED ORDER — LISINOPRIL 10 MG PO TABS
10.0000 mg | ORAL_TABLET | Freq: Every day | ORAL | Status: DC
  Administered 2020-07-17 – 2020-07-20 (×4): 10 mg via ORAL
  Filled 2020-07-17 (×4): qty 1

## 2020-07-17 MED ORDER — VITAMIN B-6 50 MG PO TABS
25.0000 mg | ORAL_TABLET | Freq: Every day | ORAL | Status: DC
  Administered 2020-07-18 – 2020-07-19 (×2): 25 mg via ORAL
  Filled 2020-07-17: qty 0.5
  Filled 2020-07-17: qty 1
  Filled 2020-07-17: qty 0.5
  Filled 2020-07-17 (×2): qty 1

## 2020-07-17 MED ORDER — DOLUTEGRAVIR SODIUM 50 MG PO TABS
50.0000 mg | ORAL_TABLET | Freq: Every day | ORAL | Status: DC
  Administered 2020-07-19 – 2020-07-20 (×2): 50 mg via ORAL
  Filled 2020-07-17 (×6): qty 1

## 2020-07-17 MED ORDER — SERTRALINE HCL 50 MG PO TABS
50.0000 mg | ORAL_TABLET | Freq: Every day | ORAL | Status: DC
  Administered 2020-07-17 – 2020-07-20 (×4): 50 mg via ORAL
  Filled 2020-07-17 (×4): qty 1

## 2020-07-17 MED ORDER — ALLOPURINOL 100 MG PO TABS
100.0000 mg | ORAL_TABLET | Freq: Every day | ORAL | Status: DC
  Administered 2020-07-18 – 2020-07-20 (×3): 100 mg via ORAL
  Filled 2020-07-17 (×3): qty 1

## 2020-07-17 MED ORDER — EMTRICITABINE-TENOFOVIR AF 200-25 MG PO TABS
1.0000 | ORAL_TABLET | Freq: Every day | ORAL | Status: DC
  Administered 2020-07-19 – 2020-07-20 (×2): 1 via ORAL
  Filled 2020-07-17 (×6): qty 1

## 2020-07-17 MED ORDER — PANTOPRAZOLE SODIUM 40 MG PO TBEC
40.0000 mg | DELAYED_RELEASE_TABLET | Freq: Every day | ORAL | Status: DC
  Administered 2020-07-17 – 2020-07-20 (×4): 40 mg via ORAL
  Filled 2020-07-17 (×4): qty 1

## 2020-07-17 MED ORDER — ASCORBIC ACID 500 MG PO TABS
500.0000 mg | ORAL_TABLET | Freq: Every day | ORAL | Status: DC
  Administered 2020-07-17 – 2020-07-20 (×4): 500 mg via ORAL
  Filled 2020-07-17 (×4): qty 1

## 2020-07-17 MED ORDER — LISINOPRIL-HYDROCHLOROTHIAZIDE 10-12.5 MG PO TABS: 1.0000 | ORAL_TABLET | Freq: Every day | ORAL | Status: DC

## 2020-07-17 MED ORDER — AMITRIPTYLINE HCL 25 MG PO TABS
25.0000 mg | ORAL_TABLET | Freq: Every day | ORAL | Status: DC
Start: 2020-07-17 — End: 2020-07-20
  Administered 2020-07-17 – 2020-07-19 (×3): 25 mg via ORAL
  Filled 2020-07-17 (×3): qty 1

## 2020-07-17 MED ORDER — ALBUTEROL SULFATE HFA 108 (90 BASE) MCG/ACT IN AERS
1.0000 | INHALATION_SPRAY | RESPIRATORY_TRACT | Status: DC | PRN
  Administered 2020-07-17: 2 via RESPIRATORY_TRACT
  Filled 2020-07-17: qty 6.7

## 2020-07-17 MED ORDER — TAMSULOSIN HCL 0.4 MG PO CAPS
0.4000 mg | ORAL_CAPSULE | Freq: Every day | ORAL | Status: DC
  Administered 2020-07-17 – 2020-07-19 (×3): 0.4 mg via ORAL
  Filled 2020-07-17 (×3): qty 1

## 2020-07-17 MED ORDER — ZINC SULFATE 220 (50 ZN) MG PO CAPS
220.0000 mg | ORAL_CAPSULE | Freq: Every day | ORAL | Status: DC
  Administered 2020-07-17 – 2020-07-20 (×4): 220 mg via ORAL
  Filled 2020-07-17 (×4): qty 1

## 2020-07-17 MED ORDER — POTASSIUM CHLORIDE CRYS ER 20 MEQ PO TBCR
20.0000 meq | EXTENDED_RELEASE_TABLET | Freq: Every day | ORAL | Status: DC
  Administered 2020-07-17 – 2020-07-20 (×4): 20 meq via ORAL
  Filled 2020-07-17 (×4): qty 1

## 2020-07-17 MED ORDER — HYDROCHLOROTHIAZIDE 12.5 MG PO CAPS
12.5000 mg | ORAL_CAPSULE | Freq: Every day | ORAL | Status: DC
  Administered 2020-07-17 – 2020-07-20 (×4): 12.5 mg via ORAL
  Filled 2020-07-17 (×4): qty 1

## 2020-07-17 MED ORDER — ASPIRIN EC 81 MG PO TBEC
81.0000 mg | DELAYED_RELEASE_TABLET | Freq: Every day | ORAL | Status: DC
  Administered 2020-07-17 – 2020-07-20 (×4): 81 mg via ORAL
  Filled 2020-07-17 (×4): qty 1

## 2020-07-17 MED ORDER — AMLODIPINE BESYLATE 5 MG PO TABS
5.0000 mg | ORAL_TABLET | Freq: Every day | ORAL | Status: DC
  Administered 2020-07-17 – 2020-07-20 (×4): 5 mg via ORAL
  Filled 2020-07-17 (×4): qty 1

## 2020-07-17 NOTE — ED Notes (Signed)
Pt complains of being cold, bed is wet with urine, urine on floor. Pt cleaned and clean gown and linens given with warm blankets.  Pt placed on cardiac monitor with BP to set cycle every 30 minutes. Continuous pulse oximeter applied. Pt given soda to drink and pt very appreciative. Oxygen @ 2lnc.

## 2020-07-17 NOTE — TOC Progression Note (Signed)
Transition of Care [facility]) - Progression Note    Patient Details  Name: MAURY GRONINGER MRN: [id number] Date of Birth: 15-Jan-1946  Transition of Care [facility]) CM/SW Contact  Shade Flood, LCSW Phone Number: 07/17/2020, 2:21 PM  Clinical Narrative:     TOC following. Noted bed offer from [facility] in the hub. Spoke with Kirsten in admissions at [facility] today to inquire on bed availability. Per Elnita Maxwell, they just had quite a few of their long term care residents test positive for Covid so they are having to re-configure all their units. Kirsten asks TOC to call tomorrow to follow up as they will have a better idea tomorrow if/when they can accept pt. Other SNF referrals are still pending a decision.  Expected Discharge Plan: Skilled Nursing Facility Barriers to Discharge: ED SNF auth,ED Unsafe disposition,ED Facility/Family Refusing to Allow Patient to Return  Expected Discharge Plan and Services Expected Discharge Plan: [place] In-house Referral: Clinical Social Work Discharge Planning Services: CM Consult Post Acute Care Choice: [place] arrangements for the past 2 months: Single Family Home                 DME Arranged: N/A DME Agency: NA       HH Arranged: NA HH Agency: NA         Social Determinants of Health (SDOH) Interventions    Readmission Risk Interventions No flowsheet data found.

## 2020-07-17 NOTE — ED Notes (Signed)
Pt talking to daughter by phone.

## 2020-07-17 NOTE — Telephone Encounter (Signed)
They are currently try to get him placed

## 2020-07-18 DIAGNOSIS — U071 COVID-19: Secondary | ICD-10-CM | POA: Diagnosis not present

## 2020-07-18 NOTE — Telephone Encounter (Signed)
Currently admitted at [facility].

## 2020-07-18 NOTE — Progress Notes (Signed)
TOC CM/CSW faxed pts information to the following facilities and followed up with previous facilities faxed out:    Faxed--[facility]-Cannot accept due to [place]. FollowUp--[facility]- Cannot accept due to COVID. Faxed--[facility] --[facility]-Declined

## 2020-07-18 NOTE — TOC Progression Note (Signed)
Call [facility] [phone] to see if she can accept as tomorrow is day 10 post pts covid+ date. Per Ronny Bacon they can take pt 2/1 if we send info and call back tomorrow to f/u per review of pts info. CSW reached out to [place] with [facility] about ability to take pt once covid recovered, she will provide update as she knows.

## 2020-07-19 DIAGNOSIS — U071 COVID-19: Secondary | ICD-10-CM | POA: Diagnosis not present

## 2020-07-19 NOTE — ED Notes (Signed)
Pt resting at this time. Respirations even and unlabored. Pt appears to be in NAD. Will continue to monitor. 

## 2020-07-19 NOTE — ED Provider Notes (Signed)
Patient is on 10th day s/p positive covid test and last fever.  His vital signs are normal and he noted to be breathing comfortably, with normal pulse ox readings. Pt does appear clear from covid standpoint in terms of safe discharge to ECF/SNF (which it appears it set to happen tomorrow).      Lajean Saver, MD 07/19/20 1155

## 2020-07-19 NOTE — TOC Progression Note (Addendum)
Transition of Care [facility]) - Progression Note   Patient Details  Name: Kerry Solis MRN: [id number] Date of Birth: Nov 18, 1945  Transition of [facility]) CM/SW Brooks, LCSW Phone Number: 07/19/2020, 9:58 AM  Clinical Narrative: CSW called Carrie Mew with [facility] to discuss SNF offer. Per Ms. Phillip Heal, [place] is unable to accept the patient as the facility is not in-network with [facility]. CSW called Debbie with [facility] to follow up with bed offer. Per Jackelyn Poling, patient would not be able to come until 07/23/20 as the facility does not accept patients until day 14 post-COVID positive test. CSW left a voicemail for [place] with [facility] requesting call back. CSW followed up with Ebony Hail with [facility] and the facility can accept the patient tomorrow as that will be day 11 post-COVID positive test. TOC to follow.  Addendum: CSW spoke with patient's daughter regarding placement and is agreeable to [facility] tomorrow. CSW explained to daughter if the patient is looking for LTC, the family will need to find the placement and it will cost several thousand dollars per month. Per daughter, family cannot afford that. CSW confirmed with Ebony Hail that patient can come tomorrow. Patient updated.  Expected Discharge Plan: Skilled Nursing Facility Barriers to Discharge: ED SNF auth,ED Unsafe disposition,ED Facility/Family Refusing to Allow Patient to Return  Expected Discharge Plan and Services Expected Discharge Plan: [place] In-house Referral: Clinical Social Work Discharge Planning Services: CM Consult Post Acute Care Choice: [place] arrangements for the past 2 months: Single Family Home             DME Arranged: N/A DME Agency: NA HH Arranged: NA [place] Agency: NA  Readmission Risk Interventions No flowsheet data found.

## 2020-07-19 NOTE — ED Notes (Signed)
Pt resting at this time. Respirations even and unlabored. Pt appears to be in NAD. Will continue to monitor.

## 2020-07-19 NOTE — Therapy (Signed)
Physical Therapy Treatment Patient Details Name: Kerry Solis MRN: [id number] DOB: 1945/11/16 Today's Date: 07/19/2020    History of Present Illness Kerry Solis is a 75 y.o. male.     The history is provided by the patient.   He has history of hypertension, diabetes, hyperlipidemia, coronary artery disease, HIV disease and was recently hospitalized for Covid pneumonia and comes in today because of having no appetite and feeling generally weak.  He has a mild cough which is nonproductive and has mild dyspnea.  He denies fever, chills, sweats.  He denies nausea, vomiting, diarrhea.  He denies arthralgias or myalgias.  He denies any urinary difficulty.  He denies any loss of sense of smell or taste.  Unfortunately, he is a very poor historian.    PT Comments    Pt slightly unsteady with mobilizing towards EOB due to posterior lean and requires increased time. Once seated, pt able to perform LAQ strengthening exercise with cues. Pt performs STS reps for strengthening, cued to shift weight anterior and to maintain feet flat on floor with fair carryover. Pt able to take a few unsteady sidesteps at bedside with BLE braced on on front of bed. Pt assisted back to supine at EOS with all needs in reach. Pt assisted back to supine with all needs in reach. Pt will benefit from continued physical therapy in hospital and recommendations below to increase strength, balance, endurance for safe ADLs and gait.   Follow Up Recommendations  SNF     Equipment Recommendations  None recommended by PT    Recommendations for Other Services       Precautions / Restrictions Precautions Precautions: Fall Restrictions Weight Bearing Restrictions: No    Mobility  Bed Mobility Overal bed mobility: Needs Assistance Bed Mobility: Supine to Sit;Sit to Supine  Supine to sit: Min guard Sit to supine: Supervision   General bed mobility comments: slow, labored movement with weight posterior requiring cues to shift  trunk anterior and scoot out to EOB  Transfers Overall transfer level: Needs assistance Equipment used: 1 person hand held assist Transfers: Sit to/from Stand Sit to Stand: Min assist    General transfer comment: min A to power up and steady once standing, weight posterior with toes raised despite cues to shift weight anterior  Ambulation/Gait Ambulation/Gait assistance: Min assist  Gait velocity: decreased  General Gait Details: limited to sidesteps at bedside with weight posterior, cued to shift weight anterior with poor carryover   Stairs             Wheelchair Mobility    Modified Rankin (Stroke Patients Only)       Balance Overall balance assessment: Needs assistance Sitting-balance support: Feet supported Sitting balance-Leahy Scale: Fair Sitting balance - Comments: seated EOB   Standing balance support: During functional activity;No upper extremity supported Standing balance-Leahy Scale: Poor Standing balance comment: reliant on support via UE or BLE braced on bed       Cognition Arousal/Alertness: Awake/alert Behavior During Therapy: WFL for tasks assessed/performed;Impulsive Overall Cognitive Status: Within Functional Limits for tasks assessed     Exercises General Exercises - Lower Extremity Long Arc Quad: AROM;Strengthening;Both;20 reps;Seated Mini-Sqauts: 10 reps    General Comments        Pertinent Vitals/Pain Pain Assessment: No/denies pain    Home Living                      Prior Function  PT Goals (current goals can now be found in the care plan section) Acute Rehab PT Goals Patient Stated Goal: return home with family to assist PT Goal Formulation: With patient Time For Goal Achievement: 07/29/20 Potential to Achieve Goals: Good    Frequency    Min 2X/week      PT Plan      Co-evaluation              AM-PAC PT "6 Clicks" Mobility   Outcome Measure  Help needed turning from your back to  your side while in a flat bed without using bedrails?: A Little Help needed moving from lying on your back to sitting on the side of a flat bed without using bedrails?: A Little Help needed moving to and from a bed to a chair (including a wheelchair)?: A Little Help needed standing up from a chair using your arms (e.g., wheelchair or bedside chair)?: A Little Help needed to walk in hospital room?: A Lot Help needed climbing 3-5 steps with a railing? : A Lot 6 Click Score: 16    End of Session   Activity Tolerance: Patient tolerated treatment well Patient left: in bed;with call bell/phone within reach Nurse Communication: Mobility status PT Visit Diagnosis: Unsteadiness on feet (R26.81);Other abnormalities of gait and mobility (R26.89);Muscle weakness (generalized) (M62.81)     Time: 1430-1450 PT Time Calculation (min) (ACUTE ONLY): 20 min  Charges:  $Therapeutic Exercise: 8-22 mins                      Tori Destyn Schuyler PT, DPT 07/19/20, 3:14 PM

## 2020-07-20 DIAGNOSIS — U071 COVID-19: Secondary | ICD-10-CM | POA: Diagnosis not present

## 2020-07-20 NOTE — ED Notes (Signed)
Called EMS for transport to [facility] in  [place].

## 2020-07-20 NOTE — ED Provider Notes (Signed)
Emergency Medicine Observation Re-evaluation Note  Kerry Solis is a 75 y.o. male, seen on rounds today.  Pt initially presented to the ED for complaints of Fatigue Currently, the patient is in no acute distress and comfortable.  Physical Exam  BP 115/69 (BP Location: Left Arm)   Pulse 88   Temp 97.9 F (36.6 C) (Oral)   Resp 16   Wt 90 kg   SpO2 99%   BMI 29.73 kg/m  Physical Exam General: No acute distress Cardiac: Regular rate Lungs: No respiratory distress Psych: Calm and cooperative  ED Course / MDM  EKG:    I have reviewed the labs performed to date as well as medications administered while in observation.  Recent changes in the last 24 hours include: None  Plan  Current plan is for patient to get placed. Patient is not under full IVC at this time.  I received a call from social work team that patient has been accepted at a facility but that he needs a discharge summary. Patient was discharged from the hospital on 1-26 with COVID-19 and weakness. He returned to the ER on 1-28.  Patient has been in the ED since then.  He was never admitted and therefore does not have a discharge summary.  I have updated the AVS, and printed the med reconciliation from that time. I was informed that patient will likely be accepted to [facility] will.  Social work is trying to sort out HIV meds.   Varney Biles, MD 07/20/20 [phone]

## 2020-07-20 NOTE — TOC Transition Note (Signed)
Transition of Care [facility]) - CM/SW Discharge Note  Patient Details  Name: Kerry Solis MRN: [id number] Date of Birth: 18-May-1946  [facility]) CM/SW Contact:  Sherie Don, LCSW Phone Number: 07/20/2020, 11:50 AM  Clinical Narrative: Kerry Solis received waiver for insurance authorization. CSW spoke with family regarding HIV medications. Family to provide those medications to the facility today. Daughter aware patient will discharge today. SNF transfer report faxed to [place]. ED to set up transportation. TOC signing off.  Final next level of care: [place] Barriers to Discharge: ED Barriers Resolved  Patient Goals and CMS Choice Patient states their goals for this hospitalization and ongoing recovery are:: Discharge to [facility] CMS Medicare.gov Compare Post Acute Care list provided to:: Patient Choice offered to / list presented to : [place]  Discharge Placement Existing PASRR number confirmed : 07/15/20          Patient chooses bed at: [facility] Patient to be transferred to facility by: [place] Name of family member notified: Scot Dock (daughter) Patient and family notified of of transfer: 07/20/20  Discharge Plan and Services In-house Referral: Clinical Social Work Discharge Planning Services: CM Consult Post Acute Care Choice: [place]          DME Arranged: N/A DME Agency: NA HH Arranged: NA [place] Agency: NA  Readmission Risk Interventions No flowsheet data found.

## 2020-07-22 ENCOUNTER — Telehealth: Payer: Self-pay | Admitting: Family Medicine

## 2020-07-22 NOTE — Telephone Encounter (Signed)
FYI- Patient is in [facility] in [place].

## 2020-08-08 ENCOUNTER — Ambulatory Visit (INDEPENDENT_AMBULATORY_CARE_PROVIDER_SITE_OTHER): Payer: Medicare Other | Admitting: Family Medicine

## 2020-08-08 ENCOUNTER — Encounter: Payer: Self-pay | Admitting: Family Medicine

## 2020-08-08 ENCOUNTER — Ambulatory Visit (HOSPITAL_COMMUNITY)
Admission: RE | Admit: 2020-08-08 | Discharge: 2020-08-08 | Disposition: A | Discharge status: Home / Self Care | Payer: Medicare Other | Source: Ambulatory Visit | Attending: Family Medicine | Admitting: Family Medicine

## 2020-08-08 ENCOUNTER — Other Ambulatory Visit: Payer: Self-pay

## 2020-08-08 ENCOUNTER — Telehealth: Payer: Self-pay

## 2020-08-08 VITALS — BP 134/84 | HR 99 | Temp 98.0°F | Wt 189.0 lb

## 2020-08-08 DIAGNOSIS — E1169 Type 2 diabetes mellitus with other specified complication: Secondary | ICD-10-CM | POA: Diagnosis not present

## 2020-08-08 DIAGNOSIS — U071 COVID-19: Secondary | ICD-10-CM | POA: Diagnosis not present

## 2020-08-08 DIAGNOSIS — J1282 Pneumonia due to coronavirus disease 2019: Secondary | ICD-10-CM

## 2020-08-08 DIAGNOSIS — R0781 Pleurodynia: Secondary | ICD-10-CM | POA: Insufficient documentation

## 2020-08-08 DIAGNOSIS — G35 Multiple sclerosis: Secondary | ICD-10-CM

## 2020-08-08 DIAGNOSIS — B20 Human immunodeficiency virus [HIV] disease: Secondary | ICD-10-CM

## 2020-08-08 DIAGNOSIS — I10 Essential (primary) hypertension: Secondary | ICD-10-CM
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
## 2020-08-08 NOTE — Telephone Encounter (Signed)
Error

## 2020-08-10 ENCOUNTER — Telehealth: Payer: Self-pay

## 2020-08-10 NOTE — Telephone Encounter (Signed)
Last seen 08/08/20. Please advise. Thank you

## 2020-08-10 NOTE — Telephone Encounter (Signed)
Wanted Dr Nicki Reaper to call in pain Medication he is a lot of pain. Sent to [place], [place]   Call back [phone]

## 2020-08-12 ENCOUNTER — Other Ambulatory Visit: Payer: Self-pay | Admitting: Family Medicine

## 2020-08-12 MED ORDER — HYDROCODONE-ACETAMINOPHEN 5-325 MG PO TABS
1.0000 | ORAL_TABLET | Freq: Four times a day (QID) | ORAL | 0 refills | Status: AC | PRN
Start: 2020-08-12 — End: 2020-08-17

## 2020-08-12 NOTE — Progress Notes (Signed)
hydr

## 2020-08-12 NOTE — Telephone Encounter (Signed)
Pt daughter contacted and verbalized understanding.  

## 2020-08-12 NOTE — Telephone Encounter (Signed)
Nurses A short  Prescription of hydrocodone was sent in.  They may use a half a tablet or whole tablet as needed for pain not relieved by Tylenol.  Caution drowsiness not for frequent use.  Follow-up if any ongoing troubles thanks-[name]

## 2020-08-16 ENCOUNTER — Telehealth: Payer: Self-pay | Admitting: Family Medicine

## 2020-08-16 NOTE — Telephone Encounter (Signed)
Patient's daughter is requesting something for pain for patient and something to help him sleep not sleeping will at night due to the pain

## 2020-08-16 NOTE — Telephone Encounter (Signed)
Tried to call to get more information and no answer. Vm not set up

## 2020-08-22 ENCOUNTER — Encounter: Payer: Self-pay | Admitting: Family Medicine

## 2020-08-22 ENCOUNTER — Ambulatory Visit: Payer: Medicare Other | Admitting: Family Medicine

## 2020-08-22 NOTE — Telephone Encounter (Signed)
Has appt today

## 2020-08-22 NOTE — Telephone Encounter (Signed)
Will discuss today at his office visit thank you

## 2020-09-05 ENCOUNTER — Other Ambulatory Visit: Payer: Self-pay | Admitting: Family Medicine

## 2020-09-23 ENCOUNTER — Telehealth: Payer: Self-pay | Admitting: Family Medicine

## 2020-09-23 NOTE — Telephone Encounter (Signed)
Tried calling patient to schedule Medicare Annual Wellness Visit (AWV) either virtually or in office.  No answer  AWV-I PER [place] 06/18/09   please schedule at anytime

## 2020-09-30 ENCOUNTER — Other Ambulatory Visit: Payer: Self-pay | Admitting: Family Medicine

## 2020-10-13 ENCOUNTER — Emergency Department (HOSPITAL_COMMUNITY)
Admission: EM | Admit: 2020-10-13 | Discharge: 2020-10-13 | Disposition: A | Discharge status: Home / Self Care | Payer: Medicare Other | Attending: Emergency Medicine | Admitting: Emergency Medicine

## 2020-10-13 ENCOUNTER — Encounter (HOSPITAL_COMMUNITY): Payer: Self-pay | Admitting: Emergency Medicine

## 2020-10-13 ENCOUNTER — Emergency Department (HOSPITAL_COMMUNITY): Payer: Medicare Other

## 2020-10-13 ENCOUNTER — Other Ambulatory Visit: Payer: Self-pay

## 2020-10-13 DIAGNOSIS — E119 Type 2 diabetes mellitus without complications: Secondary | ICD-10-CM | POA: Diagnosis not present

## 2020-10-13 DIAGNOSIS — N201 Calculus of ureter: Secondary | ICD-10-CM | POA: Diagnosis not present

## 2020-10-13 DIAGNOSIS — Z79899 Other long term (current) drug therapy: Secondary | ICD-10-CM | POA: Insufficient documentation

## 2020-10-13 DIAGNOSIS — R109 Unspecified abdominal pain: Secondary | ICD-10-CM | POA: Diagnosis not present

## 2020-10-13 DIAGNOSIS — R1030 Lower abdominal pain, unspecified: Secondary | ICD-10-CM | POA: Diagnosis not present

## 2020-10-13 DIAGNOSIS — I10 Essential (primary) hypertension: Secondary | ICD-10-CM | POA: Insufficient documentation

## 2020-10-13 DIAGNOSIS — N200 Calculus of kidney: Secondary | ICD-10-CM | POA: Insufficient documentation

## 2020-10-13 DIAGNOSIS — K5900 Constipation, unspecified: Secondary | ICD-10-CM | POA: Insufficient documentation

## 2020-10-13 DIAGNOSIS — Z21 Asymptomatic human immunodeficiency virus [HIV] infection status: Secondary | ICD-10-CM | POA: Diagnosis not present

## 2020-10-13 DIAGNOSIS — K760 Fatty (change of) liver, not elsewhere classified: Secondary | ICD-10-CM | POA: Diagnosis not present

## 2020-10-13 DIAGNOSIS — R1110 Vomiting, unspecified: Secondary | ICD-10-CM | POA: Diagnosis not present

## 2020-10-13 DIAGNOSIS — R000 Tachycardia, unspecified: Secondary | ICD-10-CM | POA: Diagnosis not present

## 2020-10-13 DIAGNOSIS — N281 Cyst of kidney, acquired: Secondary | ICD-10-CM | POA: Diagnosis not present

## 2020-10-13 DIAGNOSIS — Z8546 Personal history of malignant neoplasm of prostate: Secondary | ICD-10-CM | POA: Diagnosis not present

## 2020-10-13 DIAGNOSIS — M19012 Primary osteoarthritis, left shoulder: Secondary | ICD-10-CM | POA: Diagnosis not present

## 2020-10-13 LAB — URINALYSIS, ROUTINE W REFLEX MICROSCOPIC
Bacteria, UA: NONE SEEN
Bilirubin Urine: NEGATIVE
Glucose, UA: NEGATIVE mg/dL
Hgb urine dipstick: NEGATIVE
Ketones, ur: NEGATIVE mg/dL
Nitrite: NEGATIVE
Protein, ur: 30 mg/dL — AB
Specific Gravity, Urine: 1.011 (ref 1.005–1.030)
WBC, UA: >50 WBC/hpf — ABNORMAL HIGH (ref 0–5)
pH: 7 (ref 5.0–8.0)

## 2020-10-13 LAB — BASIC METABOLIC PANEL
Anion gap: 11 (ref 5–15)
BUN: 19 mg/dL (ref 8–23)
CO2: 25 mmol/L (ref 22–32)
Calcium: 9.4 mg/dL (ref 8.9–10.3)
Chloride: 105 mmol/L (ref 98–111)
Creatinine, Ser: 1.39 mg/dL — ABNORMAL HIGH (ref 0.61–1.24)
GFR, Estimated: 53 mL/min — ABNORMAL LOW (ref >60)
Glucose, Bld: 137 mg/dL — ABNORMAL HIGH (ref 70–99)
Potassium: 4.2 mmol/L (ref 3.5–5.1)
Sodium: 141 mmol/L (ref 135–145)

## 2020-10-13 LAB — CBC WITH DIFFERENTIAL/PLATELET
Abs Immature Granulocytes: 0.04 10*3/uL (ref 0.00–0.07)
Basophils Absolute: 0 10*3/uL (ref 0.0–0.1)
Basophils Relative: 0 %
Eosinophils Absolute: 0 10*3/uL (ref 0.0–0.5)
Eosinophils Relative: 0 %
HCT: 46.3 % (ref 39.0–52.0)
Hemoglobin: 14 g/dL (ref 13.0–17.0)
Immature Granulocytes: 0 %
Lymphocytes Relative: 9 %
Lymphs Abs: 0.9 10*3/uL (ref 0.7–4.0)
MCH: 28 pg (ref 26.0–34.0)
MCHC: 30.2 g/dL (ref 30.0–36.0)
MCV: 92.6 fL (ref 80.0–100.0)
Monocytes Absolute: 0.7 10*3/uL (ref 0.1–1.0)
Monocytes Relative: 7 %
Neutro Abs: 8.9 10*3/uL — ABNORMAL HIGH (ref 1.7–7.7)
Neutrophils Relative %: 84 %
Platelets: 266 10*3/uL (ref 150–400)
RBC: 5 MIL/uL (ref 4.22–5.81)
RDW: 16.1 % — ABNORMAL HIGH (ref 11.5–15.5)
WBC: 10.5 10*3/uL (ref 4.0–10.5)
nRBC: 0 % (ref 0.0–0.2)

## 2020-10-13 MED ORDER — IOHEXOL 300 MG/ML  SOLN
100.0000 mL | Freq: Once | INTRAMUSCULAR | Status: AC | PRN
  Administered 2020-10-13: 100 mL via INTRAVENOUS

## 2020-10-13 MED ORDER — FENTANYL CITRATE (PF) 100 MCG/2ML IJ SOLN
50.0000 ug | Freq: Once | INTRAMUSCULAR | Status: AC
  Administered 2020-10-13: 50 ug via INTRAVENOUS
  Filled 2020-10-13: qty 2

## 2020-10-13 MED ORDER — ONDANSETRON 4 MG PO TBDP: 4.0000 mg | ORAL_TABLET | Freq: Three times a day (TID) | ORAL | 0 refills | Status: DC | PRN

## 2020-10-13 MED ORDER — OXYCODONE-ACETAMINOPHEN 5-325 MG PO TABS: 1.0000 | ORAL_TABLET | Freq: Three times a day (TID) | ORAL | 0 refills | Status: DC | PRN

## 2020-10-13 MED ORDER — DOCUSATE SODIUM 100 MG PO CAPS: 100.0000 mg | ORAL_CAPSULE | Freq: Two times a day (BID) | ORAL | 0 refills | Status: DC | PRN

## 2020-10-13 MED ORDER — ONDANSETRON HCL 4 MG/2ML IJ SOLN
4.0000 mg | Freq: Once | INTRAMUSCULAR | Status: AC
  Administered 2020-10-13: 4 mg via INTRAVENOUS
  Filled 2020-10-13: qty 2

## 2020-10-13 NOTE — ED Notes (Signed)
Pt going to CT

## 2020-10-13 NOTE — ED Notes (Signed)
Bladder scan performed, found 139ml of urine

## 2020-10-13 NOTE — ED Provider Notes (Signed)
[facility] Provider Note   CSN: [id number] Arrival date & time: 10/13/20  [id number]     History Chief Complaint  Patient presents with  . Abdominal Pain    Kerry Solis is a 75 y.o. male.  HPI Patient presents with abdominal pain.  Has had for around 2 days.  Also some nausea and vomiting since yesterday.  Last bowel movement was 2 days ago.  Usually has a bowel movement every day.  States he is also been having difficulty urinating.  States he will only have a few drops go to time.  Not having fevers.  Pain is dull in his lower abdomen.  The intensity will wax and wane somewhat but the pain has been constant.  No fevers.  History of HIV but undetectable viral load and last CD4 count I see all that was 5 years ago was over 400.    Past Medical History:  Diagnosis Date  . Anxiety   . Arthritis   . 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 Depression   . Elevated liver enzymes   . Glaucoma   . High triglycerides   . History of gout   . HIV (human immunodeficiency virus infection) ([place])   . Hypertension   . Leukopenia    Low CD4  . Multiple sclerosis ([place])    uses a cane  . Myocardial infarction ([place])    45 years ago  . Prostate cancer [facility])     Patient Active Problem List   Diagnosis Date Noted  . Pneumonia due to COVID-19 virus 07/10/2020  . History of gout   . Leukopenia   . AKI (acute kidney injury) ([place])   . Dehydration   . Glaucoma   . Anxiety with depression   . S/P laparoscopic appendectomy 12/13/2017  . Acute appendicitis   . Lesion of pancreas 04/24/2017  . Gastritis due to nonsteroidal anti-inflammatory drug (NSAID)   . Prostate cancer ([place]) 06/27/2016  . Fall at home 03/15/2016  . Tibial plateau fracture, right 03/15/2016  . Inability to walk 03/15/2016  . Right medial tibial plateau fracture 03/15/2016  . Multiple sclerosis ([place])   . Fall   . Diabetes type 2, controlled ([place]) 10/05/2015  . Gout 10/05/2015   . Fatty liver 06/24/2015  . Hx of adenomatous colonic polyps 03/31/2015  . FH: colon cancer 03/31/2015  . Prediabetes 12/04/2013  . Hyperlipemia 03/11/2013  . Hyperglycemia 03/11/2013  . Stress fracture 01/14/2013  . Knee pain, chronic 01/14/2013  . HIV (human immunodeficiency virus infection) ([place]) 02/14/2009  . UNSPECIFIED VISUAL LOSS 02/14/2009  . Essential hypertension 02/14/2009  . CONSTIPATION 02/14/2009  . ABDOMINAL PAIN 02/14/2009  . Elevated transaminase level 02/14/2009  . HX OF GALLSTONE 02/14/2009    Past Surgical History:  Procedure Laterality Date  . BIOPSY  08/14/2016   Procedure: BIOPSY;  Surgeon: Danie Binder, MD;  Location: [facility];  Service: Endoscopy;;  gastric bx's  . CHOLECYSTECTOMY  06/10/2011   Procedure: LAPAROSCOPIC CHOLECYSTECTOMY;  Surgeon: Donato Heinz, MD;  Location: [facility] ORS;  Service: General;  Laterality: N/A;  . COLONOSCOPY  03/10/2009   [id number] internal hemorrhoids/6-mm sessile ascending colon polyp/tortuous colon, diverticulosis. TA  . COLONOSCOPY WITH PROPOFOL N/A 04/19/2015   Dr. Oneida Alar: sessile serrated adenoma, surveillance in 5 years   . ESOPHAGOGASTRODUODENOSCOPY  06/23/2008   [id number] gastritis/schatzki ring  . ESOPHAGOGASTRODUODENOSCOPY (EGD) WITH PROPOFOL N/A 08/14/2016   Dr. Oneida Alar: moderate Schatzi's ring s/p  dilation, LA Grade A esophagitis,small hiatal hernia, chronic gastritis (negative H.pylori), one duodenal diverticulum  . LAPAROSCOPIC APPENDECTOMY N/A 12/13/2017   Procedure: APPENDECTOMY LAPAROSCOPIC;  Surgeon: Aviva Signs, MD;  Location: [facility] ORS;  Service: General;  Laterality: N/A;  . NECK SURGERY  unsure   disc  . POLYPECTOMY N/A 04/19/2015   Procedure: POLYPECTOMY;  Surgeon: Danie Binder, MD;  Location: [facility] ORS;  Service: Endoscopy;  Laterality: N/A;  ascending colon  . PROSTATE BIOPSY    . RADIOACTIVE SEED IMPLANT N/A 11/29/2016   Procedure: RADIOACTIVE SEED IMPLANT/BRACHYTHERAPY IMPLANT;  Surgeon:  Franchot Gallo, MD;  Location: [facility];  Service: Urology;  Laterality: N/A;  81 seeds implanted  . SAVORY DILATION N/A 08/14/2016   Procedure: SAVORY DILATION;  Surgeon: Danie Binder, MD;  Location[facility];  Service: Endoscopy;  Laterality: N/A;       Family History  Problem Relation Age of Onset  . Hypertension Mother   . Heart attack Mother   . Heart attack Father   . Cancer Sister        Breast  . Diabetes Sister   . Diabetes Brother   . Cancer Brother        unknown  . Cancer Brother        colon  . Cancer Sister        breast  . Cancer Brother        prostate  . Cancer Brother        colon    Social History   Tobacco Use  . Smoking status: Never Smoker  . Smokeless tobacco: Never Used  Vaping Use  . Vaping Use: Never used  Substance Use Topics  . Alcohol use: No  . Drug use: No    Home Medications Prior to Admission medications   Medication Sig Start Date End Date Taking? Authorizing Provider  acetaminophen (TYLENOL) 325 MG tablet Take 325 mg by mouth every 4 (four) hours as needed for mild pain. 01/15/20  Yes [provider]  allopurinol (ZYLOPRIM) 100 MG tablet TAKE (1) TABLET BY MOUTH ONCE DAILY FOR GOUT. Patient taking differently: Take 100 mg by mouth daily. 06/09/20  Yes Luking, Scott A, MD  amLODipine (NORVASC) 5 MG tablet TAKE (1) TABLET BY MOUTH DAILY FOR HIGH BLOOD PRESSURE. Patient taking differently: Take 5 mg by mouth daily. 09/06/20  Yes Kathyrn Drown, MD  docusate sodium (COLACE) 100 MG capsule Take 1 capsule (100 mg total) by mouth 2 (two) times daily as needed for mild constipation (When taking pain medicine.). 10/13/20  Yes Davonna Belling, MD  gabapentin (NEURONTIN) 100 MG capsule Take 100 mg by mouth 3 (three) times daily. 06/29/20  Yes [provider]  oxyCODONE-acetaminophen (PERCOCET/ROXICET) 5-325 MG tablet Take 1-2 tablets by mouth every 8 (eight) hours as needed for severe pain. 10/13/20   Yes Davonna Belling, MD  pantoprazole (PROTONIX) 40 MG tablet TAKE 1 TABLET BY MOUTH ONCE DAILY. Patient taking differently: Take 40 mg by mouth daily. 09/06/20  Yes Kathyrn Drown, MD  pravastatin (PRAVACHOL) 40 MG tablet Take 1 tablet (40 mg total) by mouth daily. 05/26/20  Yes Luking, Elayne Snare, MD  tamsulosin (FLOMAX) 0.4 MG CAPS capsule TAKE 1 CAPSULE BY MOUTH AT BEDTIME FOR PROSTATE URINE FLOW Patient taking differently: Take 0.4 mg by mouth at bedtime. 10/03/20  Yes Kathyrn Drown, MD  ALPRAZolam (XANAX) 0.25 MG tablet TAKE 1 TABLET TWICE DAILY AS NEEDED FOR ANXIETY Patient not taking: Reported  on 10/13/2020 02/23/20   Kathyrn Drown, MD  guaiFENesin-dextromethorphan (ROBITUSSIN DM) 100-10 MG/5ML syrup Take 10 mLs by mouth every 4 (four) hours as needed for cough. Patient not taking: No sig reported 07/13/20   Manuella Ghazi, Pratik D, DO  ondansetron (ZOFRAN-ODT) 4 MG disintegrating tablet Take 1 tablet (4 mg total) by mouth every 8 (eight) hours as needed for nausea or vomiting. 10/13/20   Davonna Belling, MD  sertraline (ZOLOFT) 50 MG tablet Take 1 tablet (50 mg total) by mouth daily. Patient not taking: No sig reported 05/23/20   Kathyrn Drown, MD    Allergies    Yellow jacket venom  Review of Systems   Review of Systems  Constitutional: Positive for appetite change. Negative for fever.  Respiratory: Negative for cough.   Gastrointestinal: Positive for abdominal pain and constipation.  Genitourinary: Positive for difficulty urinating.  Musculoskeletal: Negative for back pain.  Skin: Negative for rash.  Neurological: Negative for tremors.  Psychiatric/Behavioral: Negative for confusion.    Physical Exam Updated Vital Signs BP (!) 146/97   Pulse (!) 110   Temp 98 F (36.7 C) (Oral)   Resp (!) 23   Ht 5\' 8"  (1.727 m)   Wt 89.4 kg   SpO2 95%   BMI 29.95 kg/m   Physical Exam Vitals and nursing note reviewed.  HENT:     Head: Atraumatic.  Cardiovascular:     Rate and Rhythm:  Regular rhythm. Tachycardia present.  Abdominal:     Hernia: No hernia is present.     Comments: Lower abdominal tenderness.  No rebound or guarding.  No hernias palpated.  Skin:    Capillary Refill: Capillary refill takes less than 2 seconds.  Neurological:     Mental Status: He is alert and oriented to person, place, and time.     ED Results / Procedures / Treatments   Labs (all labs ordered are listed, but only abnormal results are displayed) Labs Reviewed  BASIC METABOLIC PANEL - Abnormal; Notable for the following components:      Result Value   Glucose, Bld 137 (*)    Creatinine, Ser 1.39 (*)    GFR, Estimated 53 (*)    All other components within normal limits  CBC WITH DIFFERENTIAL/PLATELET - Abnormal; Notable for the following components:   RDW 16.1 (*)    Neutro Abs 8.9 (*)    All other components within normal limits  URINALYSIS, ROUTINE W REFLEX MICROSCOPIC - Abnormal; Notable for the following components:   Color, Urine STRAW (*)    Protein, ur 30 (*)    Leukocytes,Ua SMALL (*)    WBC, UA >50 (*)    All other components within normal limits    EKG None  Radiology CT ABDOMEN PELVIS W CONTRAST  Result Date: 10/13/2020 CLINICAL DATA:  Lower abdominal pain with nausea and vomiting EXAM: CT ABDOMEN AND PELVIS WITH CONTRAST TECHNIQUE: Multidetector CT imaging of the abdomen and pelvis was performed using the standard protocol following bolus administration of intravenous contrast. CONTRAST:  167mL OMNIPAQUE IOHEXOL 300 MG/ML  SOLN COMPARISON:  December 13, 2017. FINDINGS: Lower chest: There is atelectatic change in the inferior lingula. Small calcified granuloma lateral left base. Lung bases otherwise are clear. Hepatobiliary: There is hepatic steatosis. A no focal liver lesions are appreciable. The gallbladder is absent. There is no biliary duct dilatation. Pancreas: There is no pancreatic mass or inflammatory focus. Spleen: No splenic lesions are evident. Adrenals/Urinary  Tract: Adrenals bilaterally appear normal. Left kidney  is edematous with left perinephric soft tissue stranding and mild perinephric fluid on the left. There is a cyst arising from the lower pole of the left kidney measuring 2.1 x 2.2 cm. There is moderate hydronephrosis on the left. There is a mass arising from the lower pole of the right kidney measuring 8 x 8 mm which cannot be classified as a cyst based on attenuation criteria. There is a 4 x 4 mm calculus in the lower pole of the left kidney. There is a calculus in the distal left ureter measuring 8 x 6 mm. No other ureteral calculi are evident. Urinary bladder is midline with wall thickness within normal limits. Stomach/Bowel: There are multiple descending colonic and sigmoid diverticula. There is inflammation from the left kidney which abuts the descending colon and proximal sigmoid colon with potential inflammation secondary to the obstruction of the left kidney. Diverticulitis arising in these areas of diverticulosis not appreciable. No bowel wall thickening. No evident bowel obstruction. Terminal ileum appears normal. Appendix absent. No periappendiceal region inflammation. Vascular/Lymphatic: No abdominal aortic aneurysm. There is aortic and iliac artery atherosclerosis. Major venous structures appear patent. No evident adenopathy in the abdomen or pelvis. Reproductive: Seed implants in prostate. Prostate and seminal vesicles not enlarged. Other: Fat noted in each inguinal ring. No abscess or ascites evident in the abdomen or pelvis. Musculoskeletal: There is degenerative change in the lower thoracic and lumbar regions. Old healed rib fractures on the left posteriorly noted. No blastic or lytic bone lesions. No intramuscular or abdominal wall lesions evident. IMPRESSION: 1. There is a calculus in the distal left ureter measuring 8 x 6 mm causing moderate hydronephrosis on the left. Left kidney is edematous with extensive perinephric soft tissue stranding  and fluid on the left. 2. Multiple descending colonic and sigmoid diverticula. Soft tissue stranding and fluid is seen along the lateral left abdomen adjacent to the descending and proximal sigmoid colon. This inflammatory change is felt to most likely common from the obstruction of the left kidney is opposed to intrinsic diverticulitis. There may be a degree of secondary inflammation of these areas of colon, however. No associated colonic wall thickening. 3.  Nonobstructing 4 x 4 mm calculus lower pole left kidney. 5. There is a focal mass in the lower pole of the right kidney measuring 8 x 8 mm which cannot be classified as a simple cyst by attenuation criteria. Further evaluation with pre and post contrast MRI nonemergently should be considered. Pre and post contrast CT could alternatively be performed, but would likely be of decreased accuracy given lesion size. 6.  Aortic Atherosclerosis (ICD10-I70.0). 7.  Hepatic steatosis. 8.  Gallbladder absent. 9.  Seed implants in prostate. Electronically Signed   By: Lowella Grip III M.D.   On: 10/13/2020 11:58   DG Shoulder Left  Result Date: 10/13/2020 CLINICAL DATA:  Shoulder pain EXAM: LEFT SHOULDER - 2+ VIEW COMPARISON:  None. FINDINGS: Negative for fracture or dislocation. Shoulder joint space normal. AC joint intact. Mild degenerative change at the greater tuberosity. No focal skeletal lesion. IMPRESSION: No acute abnormality. Mild degenerative changes at the rotator cuff insertion on the greater tuberosity. Electronically Signed   By: Franchot Gallo M.D.   On: 10/13/2020 09:25   DG Abd 2 Views  Result Date: 10/13/2020 CLINICAL DATA:  Lower abdominal pain with nausea and vomiting EXAM: ABDOMEN - 2 VIEW COMPARISON:  None. FINDINGS: Normal bowel gas pattern. No bowel obstruction or ileus. No free air. Moderate stool in the colon  especially the left colon. Negative for renal calculi. Radioactive seeds in the prostate bed. Lumbar scoliosis without acute  skeletal abnormality. IMPRESSION: Normal bowel gas pattern with moderate retained stool in the colon. Electronically Signed   By: Franchot Gallo M.D.   On: 10/13/2020 09:24    Procedures Procedures   Medications Ordered in ED Medications  ondansetron (ZOFRAN) injection 4 mg (4 mg Intravenous Given 10/13/20 0912)  fentaNYL (SUBLIMAZE) injection 50 mcg (50 mcg Intravenous Given 10/13/20 1014)  iohexol (OMNIPAQUE) 300 MG/ML solution 100 mL (100 mLs Intravenous Contrast Given 10/13/20 1051)    ED Course  I have reviewed the triage vital signs and the nursing notes.  Pertinent labs & imaging results that were available during my care of the patient were reviewed by me and considered in my medical decision making (see chart for details).    MDM Rules/Calculators/A&P                         Patient presents with abdominal pain.  Has had for last couple days.  Some nausea and vomiting.  Some constipation.  Some urinary symptoms.  He is HIV positive with reassuring CD4 count.  Lab work reassuring.  However continued pain.  CT scan done and showed ureteral stone on left.  Diverticulitis felt less likely although some abnormalities likely from the renal stone.  Also renal mass/cyst that will need following up.  Can be followed with PCP or urology for that.  Follow with urology for the stone.  No infection.  Pain medicine given along with nausea medicines and stool softeners.  Discharge home.  Final Clinical Impression(s) / ED Diagnoses Final diagnoses:  Abdominal pain  Left ureteral stone  Cyst of left kidney    Rx / DC Orders ED Discharge Orders         Ordered    oxyCODONE-acetaminophen (PERCOCET/ROXICET) 5-325 MG tablet  Every 8 hours PRN        10/13/20 1255    docusate sodium (COLACE) 100 MG capsule  2 times daily PRN        10/13/20 1255    ondansetron (ZOFRAN-ODT) 4 MG disintegrating tablet  Every 8 hours PRN,   Status:  Discontinued        10/13/20 1255    ondansetron (ZOFRAN-ODT) 4  MG disintegrating tablet  Every 8 hours PRN        10/13/20 1256           Davonna Belling, MD 10/13/20 1308

## 2020-10-13 NOTE — ED Triage Notes (Signed)
Pt c/o of lower abdominal pain with n/v since yesterday morning.

## 2020-10-13 NOTE — Discharge Instructions (Addendum)
Take the pain medicine to help with the kidney stone pain.  Take the stool softeners to help with constipation.  There are also has a cyst on the kidney that we will need to get followed.  Your primary care doctor or urology can help with this.

## 2020-10-13 NOTE — ED Notes (Signed)
Last bm noted small on Tues.

## 2020-10-14 ENCOUNTER — Telehealth: Payer: Self-pay

## 2020-10-14 NOTE — Telephone Encounter (Signed)
Transition Care Management Unsuccessful Follow-up Telephone Call  Date of discharge and from where:  10/13/2020 from [facility]  Attempts:  1st Attempt  Reason for unsuccessful TCM follow-up call:  Unable to leave message

## 2020-10-17 NOTE — Telephone Encounter (Signed)
Transition Care Management Unsuccessful Follow-up Telephone Call  Date of discharge and from where:  10/13/2020 from [facility]  Attempts:  2nd Attempt  Reason for unsuccessful TCM follow-up call:  Unable to leave message

## 2020-10-18 NOTE — Telephone Encounter (Signed)
Transition Care Management Unsuccessful Follow-up Telephone Call  Date of discharge and from where:  10/13/2020 from [facility]  Attempts:  3rd Attempt  Reason for unsuccessful TCM follow-up call:  Unable to reach patient

## 2020-10-28 ENCOUNTER — Other Ambulatory Visit: Payer: Self-pay

## 2020-10-28 ENCOUNTER — Ambulatory Visit (INDEPENDENT_AMBULATORY_CARE_PROVIDER_SITE_OTHER): Payer: Medicare Other | Admitting: Family Medicine

## 2020-10-28 ENCOUNTER — Encounter: Payer: Self-pay | Admitting: Family Medicine

## 2020-10-28 VITALS — BP 124/78 | HR 93 | Temp 98.1°F | Ht 68.0 in | Wt 186.0 lb

## 2020-10-28 DIAGNOSIS — I10 Essential (primary) hypertension: Secondary | ICD-10-CM | POA: Diagnosis not present

## 2020-10-28 DIAGNOSIS — E1169 Type 2 diabetes mellitus with other specified complication: Secondary | ICD-10-CM | POA: Diagnosis not present

## 2020-10-28 DIAGNOSIS — E7849 Other hyperlipidemia: Secondary | ICD-10-CM

## 2020-10-28 DIAGNOSIS — G35 Multiple sclerosis: Secondary | ICD-10-CM

## 2020-10-28 DIAGNOSIS — C61 Malignant neoplasm of prostate: Secondary | ICD-10-CM

## 2020-10-28 DIAGNOSIS — B20 Human immunodeficiency virus [HIV] disease: Secondary | ICD-10-CM

## 2020-10-28 NOTE — Progress Notes (Signed)
   Subjective:    Patient ID: Kerry Solis, male    DOB: 03/03/1946, 75 y.o.   MRN: [id number]  [place] follow up. Pt states he is better and not having any trouble from the kidneys stones now.  Essential hypertension  Prostate cancer ([place]), Chronic - Plan: Ambulatory referral to Urology  Multiple sclerosis ([place])  Controlled type 2 diabetes mellitus with other specified complication, without long-term current use of insulin ([place])  Currently asymptomatic HIV infection, with history of HIV-related illness ([place])  Other hyperlipidemia   Patient recently in the hospital ER for kidney stone he feels its past he states he is not having any pain or discomfort no dysuria no problems.  He does have a underlying history of multiple sclerosis sees specialist once or twice every year He also has diabetes blood pressure cholesterol issues takes his medicines.  Watches diet Currently he has family staying with him but this is causing a lot of stress he is trying to work through this there is a possibility he may be back on his own again I did caution him that he would benefit from having people help him out  Patient does have HIV is being currently treated by [facility] doing very well  History of prostate cancer PSA last year very small but does need referral back to urology for further evaluation   Review of Systems     Objective:   Physical Exam  Lungs clear heart regular pulse normal BP good extremities no edema skin warm dry      Assessment & Plan:  1. Essential hypertension Blood pressure good control continue current medication watch diet stay active  2. Prostate cancer [facility]) Seemingly this is doing very well but does need annual follow-up with urology will help set this up Also had a recent kidney stone but this seems to have passed - Ambulatory referral to Urology  3. Multiple sclerosis ([place]) MS see specialist once or twice a year at [facility] we will continue  this  4. Controlled type 2 diabetes mellitus with other specified complication, without long-term current use of insulin ([place]) Patient relates his diabetes been under good control recent A1c was reasonable recheck this again on follow-up follow-up in 4 months  5. Currently asymptomatic HIV infection, with history of HIV-related illness ([place]) Followed by [facility] twice yearly if not more doing well currently  6. Other hyperlipidemia Takes his medication watch his diet continue current measures  Recheck patient 4 months

## 2020-11-02 ENCOUNTER — Telehealth: Payer: Self-pay

## 2020-11-02 ENCOUNTER — Other Ambulatory Visit: Payer: Self-pay

## 2020-11-02 ENCOUNTER — Other Ambulatory Visit: Payer: Self-pay | Admitting: *Deleted

## 2020-11-02 MED ORDER — AMLODIPINE BESYLATE 5 MG PO TABS: 5.0000 mg | ORAL_TABLET | Freq: Every day | ORAL | 2 refills | Status: DC

## 2020-11-02 MED ORDER — TAMSULOSIN HCL 0.4 MG PO CAPS: ORAL_CAPSULE | ORAL | 3 refills | Status: DC

## 2020-11-02 MED ORDER — PRAVASTATIN SODIUM 40 MG PO TABS: 40.0000 mg | ORAL_TABLET | Freq: Every day | ORAL | 1 refills | Status: DC

## 2020-11-02 MED ORDER — PANTOPRAZOLE SODIUM 40 MG PO TBEC: 1.0000 | DELAYED_RELEASE_TABLET | Freq: Every day | ORAL | 2 refills | Status: DC

## 2020-11-02 MED ORDER — ALLOPURINOL 100 MG PO TABS: 100.0000 mg | ORAL_TABLET | Freq: Every day | ORAL | 11 refills | Status: DC

## 2020-11-02 NOTE — Telephone Encounter (Signed)
Medications prescribed by Dr.Scott sent to [facility]. Left message to return call to let pt know.

## 2020-11-02 NOTE — Telephone Encounter (Signed)
Pt notified. He states he did not get pravastatin or the eye drops. I sent pravastatin but eye drops not on med list. I asked pt who prescribed them and he said MS specialist in [place]. I told him to call the specialist. He then said dr Nicki Reaper has sent them in for him in the past. I said I would ask dr Nicki Reaper but needed to know the name and strength and directions. Pt did not know but states he will bring the bottle in tomorrow

## 2020-11-02 NOTE — Telephone Encounter (Signed)
Pt come by with paperwork saying his insurance is not coving medication through [facility] in [place] and he needs his medication switch to [facility] on [address] this is on all his Medication   Pt call [phone]

## 2020-11-04 ENCOUNTER — Telehealth: Payer: Self-pay

## 2020-11-04 NOTE — Telephone Encounter (Signed)
Pt come by to bring the name of the eye drops he is on Latanoprost Ophthalmic 0.005 125 mcg/2.5 ml  One drop each eye at night    Pt call back [phone]

## 2020-11-04 NOTE — Telephone Encounter (Signed)
Med filled by MS specialist at [facility] but pt states Dr.Scott has filled them before. Please advise.   (Pt is aware provider is out until Monday)

## 2020-11-05 NOTE — Telephone Encounter (Signed)
Nurses This is an eyedrop that is for glaucoma. At is outside of my scope of practice Please have patient check with his pharmacy to see which eye doctor is prescribing his medicine He needs to have the eye doctor manage this medicine (If for some odd reason he cannot get in with the eye doctor for several weeks we can do 1 refill but we will not do ongoing refills)  Please work with patient and pharmacy to resolve this issue thank you If he needs a referral back to his eye doctor please give it Thanks-[name]

## 2020-11-06 ENCOUNTER — Telehealth: Payer: Self-pay | Admitting: Family Medicine

## 2020-11-06 NOTE — Telephone Encounter (Signed)
See previous message- in regards to his eyedrop for glaucoma this is prescribed according to [facility] from a Dr. Beatrix Fetters  (The patient had requested for [place] to fill this-previous message I stated that this is outside of my area of expertise, I recommend that his prescribing doctor give him refills regarding this medicine thank you)

## 2020-11-07 NOTE — Telephone Encounter (Signed)
Left message to return call 

## 2020-11-07 NOTE — Telephone Encounter (Signed)
Discussed with pt. Pt states he will get daughter to call specialist to get refill and call us back if any problems.

## 2020-11-07 NOTE — Telephone Encounter (Signed)
Discussed with pt. Pt verbalized understanding.  °

## 2020-11-10 ENCOUNTER — Telehealth: Payer: Self-pay

## 2020-11-10 NOTE — Telephone Encounter (Signed)
Pharmacy updated in EPIC

## 2020-11-10 NOTE — Telephone Encounter (Signed)
Patient wants it noted in his chart that he uses [facility] [address] now instead of [facility].  He doesn't need any refills right now he just wants pharmacy changed.

## 2020-11-28 DIAGNOSIS — Z79899 Other long term (current) drug therapy: Secondary | ICD-10-CM | POA: Diagnosis not present

## 2020-11-28 DIAGNOSIS — G35 Multiple sclerosis: Secondary | ICD-10-CM | POA: Diagnosis not present

## 2020-12-13 ENCOUNTER — Telehealth: Payer: Medicare Other | Admitting: Family Medicine

## 2020-12-13 NOTE — Telephone Encounter (Signed)
Daughter contacted and verbalized understanding. Daughter states that lunch time would be fine tomorrow [phone] preferred number.  Please advise. Thank you

## 2020-12-13 NOTE — Telephone Encounter (Signed)
Please talk with daughter Lorenso Courier [phone] 1.  I agree that the patient should not drive #2 I agree that the patient should not be by himself #3 please talk with daughter I can give her a call but currently right now I am already booked up for today plus phone calls at the end of the day so we will need to schedule for a phone call either tomorrow at noon or Wednesday approximately in between 5 PM and 5:30 PM If that is not suitable then we can offer phone call lunchtime on Friday  Please find out her favor and her preferred phone number and forward that to me thank you

## 2020-12-13 NOTE — Telephone Encounter (Signed)
Pt daughter calling and states that his dad is "losing his mind", going against everything that Dr.Scott has told him. Pt is not supposed to drive or be behind the wheel or at home alone. People are letting patient drive even thought he is not suppose to  be. Daughter would like Dr.Scott to call her and talk with her about father. Please advise. Thank you

## 2020-12-14 ENCOUNTER — Telehealth (INDEPENDENT_AMBULATORY_CARE_PROVIDER_SITE_OTHER): Payer: Medicare Other | Admitting: Family Medicine

## 2020-12-14 ENCOUNTER — Other Ambulatory Visit: Payer: Self-pay

## 2020-12-14 ENCOUNTER — Telehealth: Payer: Self-pay | Admitting: Family Medicine

## 2020-12-14 DIAGNOSIS — R413 Other amnesia: Secondary | ICD-10-CM

## 2020-12-14 NOTE — Telephone Encounter (Signed)
[name], nong are scheduled for a virtual visit with your provider today.    Just as we do with appointments in the office, we must obtain your consent to participate.  Your consent will be active for this visit and any virtual visit you may have with one of our providers in the next 365 days.    If you have a MyChart account, I can also send a copy of this consent to you electronically.  All virtual visits are billed to your insurance company just like a traditional visit in the office.  As this is a virtual visit, video technology does not allow for your provider to perform a traditional examination.  This may limit your provider's ability to fully assess your condition.  If your provider identifies any concerns that need to be evaluated in person or the need to arrange testing such as labs, EKG, etc, we will make arrangements to do so.    Although advances in technology are sophisticated, we cannot ensure that it will always work on either your end or our end.  If the connection with a video visit is poor, we may have to switch to a telephone visit.  With either a video or telephone visit, we are not always able to ensure that we have a secure connection.   I need to obtain your verbal consent now.   Are you willing to proceed with your visit today?   Kerry Solis has provided verbal consent on 12/14/2020 for a virtual visit (video or telephone). Antionetter-daughter  Vicente Males, LPN 7/93/9030  0:92 AM

## 2020-12-14 NOTE — Telephone Encounter (Signed)
FYI Discussion held with daughter and son regarding patient's issues I did advise once again that the patient should not drive and should not be left alone by himself

## 2020-12-14 NOTE — Progress Notes (Signed)
   Subjective:    Patient ID: Kerry Solis, male    DOB: 25-Nov-1945, 75 y.o.   MRN: [id number]  HPI Pt daughter would like to speak with Dr.Jaiden Solis. Pt has been "losing his mind" and going against everything that provider has told him. Also driving when not suppose to be and being home alone.  Virtual Visit via Telephone Note  I connected with Kerry Solis on 12/14/20 at 12:00 PM EDT by telephone and verified that I am speaking with the correct person using two identifiers.  Location: Patient: home Provider: office   I discussed the limitations, risks, security and privacy concerns of performing an evaluation and management service by telephone and the availability of in person appointments. I also discussed with the patient that there may be a patient responsible charge related to this service. The patient expressed understanding and agreed to proceed.   History of Present Illness:    Observations/Objective:   Assessment and Plan:   Follow Up Instructions:    I discussed the assessment and treatment plan with the patient. The patient was provided an opportunity to ask questions and all were answered. The patient agreed with the plan and demonstrated an understanding of the instructions.   The patient was advised to call back or seek an in-person evaluation if the symptoms worsen or if the condition fails to improve as anticipated.  I provided 10 minutes of non-face-to-face time during this encounter.       Review of Systems     Objective:   Physical Exam  Today's visit was via telephone Physical exam was not possible for this visit       Assessment & Plan:  Patient has underlying poor short-term memory and cognitive issues it is in my opinion the patient should not be driving and the patient should not be left alone.  We will bring him in for an office visit within the next few weeks for further discussion  The above information was told to his daughter and  his son

## 2020-12-26 ENCOUNTER — Telehealth: Payer: Self-pay | Admitting: Family Medicine

## 2020-12-26 NOTE — Telephone Encounter (Signed)
Pt Is needing a refill for Latanoprost ophthalmic solution  [id number] - [place], [place] - [address]

## 2020-12-26 NOTE — Telephone Encounter (Signed)
Please advise. Thank you

## 2020-12-27 ENCOUNTER — Other Ambulatory Visit: Payer: Self-pay

## 2020-12-27 ENCOUNTER — Ambulatory Visit (INDEPENDENT_AMBULATORY_CARE_PROVIDER_SITE_OTHER): Payer: Medicare Other | Admitting: Urology

## 2020-12-27 ENCOUNTER — Encounter: Payer: Self-pay | Admitting: Urology

## 2020-12-27 VITALS — BP 145/87 | HR 102 | Temp 98.2°F | Wt 203.5 lb

## 2020-12-27 DIAGNOSIS — N138 Other obstructive and reflux uropathy: Secondary | ICD-10-CM | POA: Diagnosis not present

## 2020-12-27 DIAGNOSIS — R351 Nocturia: Secondary | ICD-10-CM | POA: Diagnosis not present

## 2020-12-27 DIAGNOSIS — R300 Dysuria: Secondary | ICD-10-CM | POA: Diagnosis not present

## 2020-12-27 DIAGNOSIS — N401 Enlarged prostate with lower urinary tract symptoms: Secondary | ICD-10-CM | POA: Diagnosis not present

## 2020-12-27 DIAGNOSIS — C61 Malignant neoplasm of prostate: Secondary | ICD-10-CM | POA: Diagnosis not present

## 2020-12-27 LAB — MICROSCOPIC EXAMINATION
Epithelial Cells (non renal): NONE SEEN /hpf (ref 0–10)
Renal Epithel, UA: NONE SEEN /hpf
WBC, UA: >30 /hpf — AB (ref 0–5)

## 2020-12-27 LAB — URINALYSIS, ROUTINE W REFLEX MICROSCOPIC
Bilirubin, UA: NEGATIVE
Glucose, UA: NEGATIVE
Ketones, UA: NEGATIVE
Nitrite, UA: NEGATIVE
Specific Gravity, UA: 1.015 (ref 1.005–1.030)
Urobilinogen, Ur: 0.2 mg/dL (ref 0.2–1.0)
pH, UA: 7 (ref 5.0–7.5)

## 2020-12-27 LAB — BLADDER SCAN AMB NON-IMAGING: Scan Result: 155

## 2020-12-27 MED ORDER — ALFUZOSIN HCL ER 10 MG PO TB24: 10.0000 mg | ORAL_TABLET | Freq: Every day | ORAL | 11 refills | Status: DC

## 2020-12-27 MED ORDER — NITROFURANTOIN MONOHYD MACRO 100 MG PO CAPS: 100.0000 mg | ORAL_CAPSULE | Freq: Two times a day (BID) | ORAL | 0 refills | Status: DC

## 2020-12-27 NOTE — Progress Notes (Signed)
post void residual=155

## 2020-12-27 NOTE — Telephone Encounter (Signed)
Pharmacist at [facility] on [address] notified and will speak to family to have script sent to eye doctor treating patient's glaucoma.

## 2020-12-27 NOTE — Telephone Encounter (Signed)
This is a glaucoma medicine that should be refilled through his ophthalmologist through [place] thank you

## 2020-12-27 NOTE — Progress Notes (Signed)
Urological Symptom Review  Patient is experiencing the following symptoms: Frequent urination Hard to postpone urination Burning/pain with urination Get up at night to urinate Leakage of urine Stream starts and stops Have to strain to urinate Injury to kidneys/bladder Weak stream Erection problems (male only) Penile pain (male only)    Review of Systems  Gastrointestinal (upper)  : Negative for upper GI symptoms  Gastrointestinal (lower) : Negative for lower GI symptoms  Constitutional : Fatigue  Skin: Negative for skin symptoms  Eyes: Negative for eye symptoms  Ear/Nose/Throat : Negative for Ear/Nose/Throat symptoms  Hematologic/Lymphatic: Negative for Hematologic/Lymphatic symptoms  Cardiovascular : Negative for cardiovascular symptoms  Respiratory : Shortness of breath  Endocrine: Excessive thirst  Musculoskeletal: Back pain Joint pain  Neurological: Negative for neurological symptoms  Psychologic: Depression Anxiety

## 2020-12-27 NOTE — Progress Notes (Signed)
12/27/2020 2:28 PM   Kerry Solis [birth year] [id number]  Referring provider: Kathyrn Drown, MD [address],  [place] [id number]  Nocturia   HPI: Kerry Solis is a 75yo here for evaluation of prostate cancer, BPh and nocturia. He was treated with radiation therapy 4 years ago. PSA has been low since therapy. He has severe LUTS on flomax 0.4mg  daily.. Nocturia 4-5x. Urine stream is weak. He denies hematuria or dysuria, IPSS 24 QOL 3.    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   Depression    Elevated liver enzymes    Glaucoma    High triglycerides    History of gout    HIV (human immunodeficiency virus infection) ([place])    Hypertension    Leukopenia    Low CD4   Multiple sclerosis ([place])    uses a cane   Myocardial infarction ([place])    45 years ago   Prostate cancer [facility])     Surgical History: Past Surgical History:  Procedure Laterality Date   BIOPSY  08/14/2016   Procedure: BIOPSY;  Surgeon: Danie Binder, MD;  [facility];  Service: Endoscopy;;  gastric bx's   CHOLECYSTECTOMY  06/10/2011   Procedure: LAPAROSCOPIC CHOLECYSTECTOMY;  Surgeon: Donato Heinz, MD;  Location: [facility];  Service: General;  Laterality: N/A;   COLONOSCOPY  03/10/2009   [id number] internal hemorrhoids/6-mm sessile ascending colon polyp/tortuous colon, diverticulosis. TA   COLONOSCOPY WITH PROPOFOL N/A 04/19/2015   Dr. Oneida Alar: sessile serrated adenoma, surveillance in 5 years    ESOPHAGOGASTRODUODENOSCOPY  06/23/2008   [id number] gastritis/schatzki ring   ESOPHAGOGASTRODUODENOSCOPY (EGD) WITH PROPOFOL N/A 08/14/2016   Dr. Oneida Alar: moderate Schatzi's ring s/p dilation, LA Grade A esophagitis,small hiatal hernia, chronic gastritis (negative H.pylori), one duodenal diverticulum   LAPAROSCOPIC APPENDECTOMY N/A 12/13/2017   Procedure: APPENDECTOMY LAPAROSCOPIC;  Surgeon: Aviva Signs, MD;  Location: [facility];  Service: General;  Laterality: N/A;   NECK SURGERY  unsure   disc   POLYPECTOMY N/A 04/19/2015   Procedure: POLYPECTOMY;  Surgeon: Danie Binder, MD;  Location: [facility];  Service: Endoscopy;  Laterality: N/A;  ascending colon   PROSTATE BIOPSY     RADIOACTIVE SEED IMPLANT N/A 11/29/2016   Procedure: RADIOACTIVE SEED IMPLANT/BRACHYTHERAPY IMPLANT;  Surgeon: Franchot Gallo, MD;  Location[facility];  Service: Urology;  Laterality: N/A;  81 seeds implanted   SAVORY DILATION N/A 08/14/2016   Procedure: SAVORY DILATION;  Surgeon: Danie Binder, MD;  [facility];  Service: Endoscopy;  Laterality: N/A;    Home Medications:  Allergies as of 12/27/2020       Reactions   Yellow Jacket Venom Swelling        Medication List        Accurate as of December 27, 2020  2:28 PM. If you have any questions, ask your nurse or doctor.          acetaminophen 325 MG tablet Commonly known as: TYLENOL Take 325 mg by mouth every 4 (four) hours as needed for mild pain.   allopurinol 100 MG tablet Commonly known as: ZYLOPRIM Take 1 tablet (100 mg total) by mouth daily.   amLODipine 5 MG tablet Commonly known as: NORVASC Take 1 tablet (5 mg total) by mouth daily.   docusate sodium 100 MG capsule Commonly known as: COLACE  Take 1 capsule (100 mg total) by mouth 2 (two) times daily as needed for mild constipation (When taking pain medicine.).   gabapentin 100 MG capsule Commonly known as: NEURONTIN Take 100 mg by mouth 3 (three) times daily.   ondansetron 4 MG disintegrating tablet Commonly known as: ZOFRAN-ODT Take 1 tablet (4 mg total) by mouth every 8 (eight) hours as needed for nausea or vomiting.   pantoprazole 40 MG tablet Commonly known as: PROTONIX Take 1 tablet (40 mg total) by mouth daily.   pravastatin 40 MG tablet Commonly known as: PRAVACHOL Take 1 tablet (40 mg total) by mouth daily.   tamsulosin 0.4 MG Caps  capsule Commonly known as: FLOMAX TAKE 1 CAPSULE BY MOUTH AT BEDTIME FOR PROSTATE URINE FLOW        Allergies:  Allergies  Allergen Reactions   Yellow Jacket Venom Swelling    Family History: Family History  Problem Relation Age of Onset   Hypertension Mother    Heart attack Mother    Heart attack Father    Cancer Sister        Breast   Diabetes Sister    Diabetes Brother    Cancer Brother        unknown   Cancer Brother        colon   Cancer Sister        breast   Cancer Brother        prostate   Cancer Brother        colon    Social History:  reports that he has never smoked. He has never used smokeless tobacco. He reports that he does not drink alcohol and does not use drugs.  ROS: All other review of systems were reviewed and are negative except what is noted above in HPI  Physical Exam: BP (!) 145/87   Pulse (!) 102   Temp 98.2 F (36.8 C)   Wt 203 lb 8 oz (92.3 kg)   BMI 30.94 kg/m   Constitutional:  Alert and oriented, No acute distress. HEENT: [place] AT, moist mucus membranes.  Trachea midline, no masses. Cardiovascular: No clubbing, cyanosis, or edema. Respiratory: Normal respiratory effort, no increased work of breathing. GI: Abdomen is soft, nontender, nondistended, no abdominal masses GU: No CVA tenderness. Circumcised phallus. No masses/lesions on penis, testis, scrotum. Prostate 40g smooth no nodules no induration.  Lymph: No cervical or inguinal lymphadenopathy. Skin: No rashes, bruises or suspicious lesions. Neurologic: Grossly intact, no focal deficits, moving all 4 extremities. Psychiatric: Normal mood and affect.  Laboratory Data: Lab Results  Component Value Date   WBC 10.5 10/13/2020   HGB 14.0 10/13/2020   HCT 46.3 10/13/2020   MCV 92.6 10/13/2020   PLT 266 10/13/2020    Lab Results  Component Value Date   CREATININE 1.39 (H) 10/13/2020    Lab Results  Component Value Date   PSA 2.39 04/30/2014   PSA 1.89 04/14/2013     No results found for: TESTOSTERONE  Lab Results  Component Value Date   HGBA1C 6.9 (H) 05/23/2020    Urinalysis    Component Value Date/Time   COLORURINE STRAW (A) 10/13/2020 0914   APPEARANCEUR CLEAR 10/13/2020 0914   LABSPEC 1.011 10/13/2020 0914   PHURINE 7.0 10/13/2020 0914   GLUCOSEU NEGATIVE 10/13/2020 0914   HGBUR NEGATIVE 10/13/2020 0914   BILIRUBINUR NEGATIVE 10/13/2020 0914   BILIRUBINUR ++ 10/31/2012 1533   KETONESUR NEGATIVE 10/13/2020 0914   PROTEINUR 30 (A) 10/13/2020 0914  UROBILINOGEN 0.2 10/28/2012 1444   NITRITE NEGATIVE 10/13/2020 0914   LEUKOCYTESUR SMALL (A) 10/13/2020 0914    Lab Results  Component Value Date   LABMICR 109.2 10/30/2016   BACTERIA NONE SEEN 10/13/2020    Pertinent Imaging:  No results found for this or any previous visit.  No results found for this or any previous visit.  No results found for this or any previous visit.  No results found for this or any previous visit.  No results found for this or any previous visit.  No results found for this or any previous visit.  No results found for this or any previous visit.  No results found for this or any previous visit.   Assessment & Plan:    1. Prostate cancer [facility]) PSA today, will call with results. If it is stable I will see him back in 6 months with a PSA - Urinalysis, Routine w reflex microscopic  2. Benign prostatic hyperplasia with urinary obstruction Uroxatral 10mg  qhs  3. Nocturia Uroxatral 10mg  QHS   No follow-ups on file.  Nicolette Bang, MD  [facility]rology [place]

## 2020-12-28 DIAGNOSIS — R7989 Other specified abnormal findings of blood chemistry: Secondary | ICD-10-CM | POA: Diagnosis not present

## 2020-12-28 DIAGNOSIS — H401124 Primary open-angle glaucoma, left eye, indeterminate stage: Secondary | ICD-10-CM | POA: Diagnosis not present

## 2020-12-28 DIAGNOSIS — Z79899 Other long term (current) drug therapy: Secondary | ICD-10-CM | POA: Diagnosis not present

## 2020-12-28 DIAGNOSIS — G35 Multiple sclerosis: Secondary | ICD-10-CM | POA: Diagnosis not present

## 2020-12-28 LAB — PSA: Prostate Specific Ag, Serum: <0.1 ng/mL (ref 0.0–4.0)

## 2020-12-30 LAB — URINE CULTURE

## 2021-01-10 ENCOUNTER — Other Ambulatory Visit: Payer: Self-pay

## 2021-01-10 ENCOUNTER — Ambulatory Visit (INDEPENDENT_AMBULATORY_CARE_PROVIDER_SITE_OTHER): Payer: Medicare Other | Admitting: Family Medicine

## 2021-01-10 VITALS — BP 138/86 | Temp 98.4°F | Wt 210.4 lb

## 2021-01-10 DIAGNOSIS — R7989 Other specified abnormal findings of blood chemistry: Secondary | ICD-10-CM

## 2021-01-10 DIAGNOSIS — I10 Essential (primary) hypertension: Secondary | ICD-10-CM

## 2021-01-10 NOTE — Progress Notes (Signed)
   Subjective:    Patient ID: Kerry Solis, male    DOB: Oct 31, 1945, 75 y.o.   MRN: [id number]  HPI Pt here for follow up per provider. Pt states things have improved a lot since last visit. Pt having joint pains throughout the day and at night. Pt states "they can get rough at times". Has been utilizing Tylenol.   Essential hypertension  Elevated serum creatinine - Plan: Basic Metabolic Panel (BMET)   Review of Systems     Objective:   Physical Exam General-in no acute distress Eyes-no discharge Lungs-respiratory rate normal, CTA CV-no murmurs,RRR Extremities skin warm dry no edema Neuro grossly normal Behavior normal, alert        Assessment & Plan:  1. Essential hypertension Blood pressure decent control continue current measures  Patient seen specialist for his MS doing well currently  Patient seeing specialist for his HIV status and he is doing well  Patient has mild short-term memory deficits but when I tested him today he passed the mini cog and also showed good verbal skills and spatial skills Currently I still advised the patient to let others do his driving. If he is able to convince his family to ride with him and he shows that he is a good driver potentially daytime driving locally would be fine  2. Elevated serum creatinine Creatinine elevated.  Need to up-to-date metabolic 7. - Basic Metabolic Panel (BMET)

## 2021-01-24 DIAGNOSIS — R7989 Other specified abnormal findings of blood chemistry: Secondary | ICD-10-CM | POA: Diagnosis not present

## 2021-01-25 ENCOUNTER — Encounter: Payer: Self-pay | Admitting: Family Medicine

## 2021-01-25 LAB — BASIC METABOLIC PANEL
BUN/Creatinine Ratio: 12 (ref 10–24)
BUN: 18 mg/dL (ref 8–27)
CO2: 21 mmol/L (ref 20–29)
Calcium: 9.7 mg/dL (ref 8.6–10.2)
Chloride: 107 mmol/L — ABNORMAL HIGH (ref 96–106)
Creatinine, Ser: 1.46 mg/dL — ABNORMAL HIGH (ref 0.76–1.27)
Glucose: 223 mg/dL — ABNORMAL HIGH (ref 65–99)
Potassium: 4 mmol/L (ref 3.5–5.2)
Sodium: 141 mmol/L (ref 134–144)
eGFR: 49 mL/min/{1.73_m2} — ABNORMAL LOW (ref >59)

## 2021-02-27 ENCOUNTER — Other Ambulatory Visit: Payer: Self-pay | Admitting: Family Medicine

## 2021-02-27 ENCOUNTER — Telehealth: Payer: Self-pay | Admitting: Family Medicine

## 2021-02-27 NOTE — Telephone Encounter (Signed)
Patient stopped by office, he is out of all of his medication that he takes during the morning. He isn't sure of the names and I offered a list but he isn't sure of what medications they are and didn't look at list. He is on his way to get cell phone fixed. He will check back to see if these have been called into [facility].  CB# [phone]

## 2021-02-27 NOTE — Telephone Encounter (Signed)
Please advise. Thank you (Unable to get in touch with pt at this time)

## 2021-03-01 ENCOUNTER — Ambulatory Visit: Payer: Medicare Other | Admitting: Urology

## 2021-03-01 NOTE — Telephone Encounter (Signed)
[facility] states pt just picked up refills on 02/27/21.

## 2021-03-01 NOTE — Telephone Encounter (Signed)
Unfortunately Kerry Solis has some mild cognitive issues with memory  I would recommend connecting with the pharmacy to see if he needs any refills on his medicines

## 2021-03-07 ENCOUNTER — Other Ambulatory Visit: Payer: Self-pay

## 2021-03-07 ENCOUNTER — Encounter: Payer: Self-pay | Admitting: Family Medicine

## 2021-03-07 ENCOUNTER — Ambulatory Visit (INDEPENDENT_AMBULATORY_CARE_PROVIDER_SITE_OTHER): Payer: Medicare Other | Admitting: Family Medicine

## 2021-03-07 VITALS — BP 138/88 | HR 82 | Ht 68.0 in | Wt 212.8 lb

## 2021-03-07 DIAGNOSIS — E7849 Other hyperlipidemia: Secondary | ICD-10-CM

## 2021-03-07 DIAGNOSIS — Z79899 Other long term (current) drug therapy: Secondary | ICD-10-CM | POA: Diagnosis not present

## 2021-03-07 DIAGNOSIS — I10 Essential (primary) hypertension: Secondary | ICD-10-CM

## 2021-03-07 DIAGNOSIS — Z23 Encounter for immunization: Secondary | ICD-10-CM | POA: Diagnosis not present

## 2021-03-07 DIAGNOSIS — E1169 Type 2 diabetes mellitus with other specified complication: Secondary | ICD-10-CM | POA: Diagnosis not present

## 2021-03-07 NOTE — Progress Notes (Signed)
   Subjective:    Patient ID: MCIHAEL HINDERMAN, male    DOB: 04/24/1946, 75 y.o.   MRN: [id number]  Hypertension This is a chronic problem. The current episode started more than 1 year ago. Risk factors for coronary artery disease include dyslipidemia and male gender. Treatments tried: norvasc.   Patient having body aches all the time- takes Tylenol  Patient for blood pressure check up.  The patient does have hypertension.    Medication compliance-relates compliance  Blood pressure control recently-today's blood pressure looks good  Dietary compliance-he minimizes salt  The patient was seen today as part of a comprehensive diabetic check up. Patient has diabetes  Compliance-states maintains low sugar diet not on any medicines currently we will check lab work low sugars-minimizes sugars in the diet Dietary effort-tries to eat well Foot exam and ophthalmology exam requirements were reviewed      Review of Systems     Objective:   Physical Exam  General-in no acute distress Eyes-no discharge Lungs-respiratory rate normal, CTA CV-no murmurs,RRR Extremities skin warm dry no edema Neuro grossly normal Behavior normal, alert       Assessment & Plan:  1. Essential hypertension Blood pressure decent control continue current medication - Urine Microalbumin w/creat. ratio - Hemoglobin T0V - Basic Metabolic Panel (BMET) - CK (Creatine Kinase) - Lipid Profile - Hepatic function panel - Uric acid  2. Controlled type 2 diabetes mellitus with other specified complication, without long-term current use of insulin (HCC) Check A1c may need to initiate medication depending on results - Urine Microalbumin w/creat. ratio - Hemoglobin W9V - Basic Metabolic Panel (BMET) - CK (Creatine Kinase) - Lipid Profile - Hepatic function panel - Uric acid  3. Other hyperlipidemia Continue cholesterol medicine check lab work - Urine Microalbumin w/creat. ratio - Hemoglobin X4I - Basic  Metabolic Panel (BMET) - CK (Creatine Kinase) - Lipid Profile - Hepatic function panel - Uric acid  4. High risk medication use Check kidney function urine micro protein - Urine Microalbumin w/creat. ratio - Hemoglobin A1K - Basic Metabolic Panel (BMET) - CK (Creatine Kinase) - Lipid Profile - Hepatic function panel - Uric acid  5. Need for vaccination Flu shot today - Flu Vaccine QUAD High Dose(Fluad)  Patient is adamant that he is capable of driving short distances locally.  Does not do any interstate driving.  Only does daytime driving.  He states he does his own grocery shopping and fixing his food.  He states he does not need anyone to check in on him I have encouraged him to allow people to check in on him on a daily basis.  He will follow-up again in 3 months. On follow-up we will do cognitive testing

## 2021-03-08 LAB — HEPATIC FUNCTION PANEL
ALT: 25 IU/L (ref 0–44)
AST: 31 IU/L (ref 0–40)
Albumin: 5 g/dL — ABNORMAL HIGH (ref 3.7–4.7)
Alkaline Phosphatase: 82 IU/L (ref 44–121)
Bilirubin Total: 0.3 mg/dL (ref 0.0–1.2)
Bilirubin, Direct: <0.1 mg/dL (ref 0.00–0.40)
Total Protein: 7.6 g/dL (ref 6.0–8.5)

## 2021-03-08 LAB — HEMOGLOBIN A1C
Est. average glucose Bld gHb Est-mCnc: 163 mg/dL
Hgb A1c MFr Bld: 7.3 % — ABNORMAL HIGH (ref 4.8–5.6)

## 2021-03-08 LAB — LIPID PANEL
Chol/HDL Ratio: 3.9 ratio (ref 0.0–5.0)
Cholesterol, Total: 121 mg/dL (ref 100–199)
HDL: 31 mg/dL — ABNORMAL LOW (ref >39)
LDL Chol Calc (NIH): 71 mg/dL (ref 0–99)
Triglycerides: 103 mg/dL (ref 0–149)
VLDL Cholesterol Cal: 19 mg/dL (ref 5–40)

## 2021-03-08 LAB — BASIC METABOLIC PANEL
BUN/Creatinine Ratio: 13 (ref 10–24)
BUN: 18 mg/dL (ref 8–27)
CO2: 20 mmol/L (ref 20–29)
Calcium: 10.1 mg/dL (ref 8.6–10.2)
Chloride: 109 mmol/L — ABNORMAL HIGH (ref 96–106)
Creatinine, Ser: 1.4 mg/dL — ABNORMAL HIGH (ref 0.76–1.27)
Glucose: 118 mg/dL — ABNORMAL HIGH (ref 65–99)
Potassium: 4.7 mmol/L (ref 3.5–5.2)
Sodium: 146 mmol/L — ABNORMAL HIGH (ref 134–144)
eGFR: 52 mL/min/{1.73_m2} — ABNORMAL LOW (ref >59)

## 2021-03-08 LAB — MICROALBUMIN / CREATININE URINE RATIO
Creatinine, Urine: 102.7 mg/dL
Microalb/Creat Ratio: 27 mg/g creat (ref 0–29)
Microalbumin, Urine: 28.2 ug/mL

## 2021-03-08 LAB — CK: Total CK: 395 U/L — ABNORMAL HIGH (ref 41–331)

## 2021-03-08 LAB — URIC ACID: Uric Acid: 8.3 mg/dL (ref 3.8–8.4)

## 2021-03-13 ENCOUNTER — Other Ambulatory Visit: Payer: Self-pay | Admitting: Family Medicine

## 2021-03-13 ENCOUNTER — Ambulatory Visit (INDEPENDENT_AMBULATORY_CARE_PROVIDER_SITE_OTHER): Payer: Medicare Other | Admitting: Urology

## 2021-03-13 ENCOUNTER — Other Ambulatory Visit: Payer: Self-pay

## 2021-03-13 ENCOUNTER — Encounter: Payer: Self-pay | Admitting: Urology

## 2021-03-13 VITALS — BP 149/77 | HR 90

## 2021-03-13 DIAGNOSIS — C61 Malignant neoplasm of prostate: Secondary | ICD-10-CM | POA: Diagnosis not present

## 2021-03-13 DIAGNOSIS — N138 Other obstructive and reflux uropathy: Secondary | ICD-10-CM | POA: Diagnosis not present

## 2021-03-13 DIAGNOSIS — R351 Nocturia: Secondary | ICD-10-CM

## 2021-03-13 DIAGNOSIS — N401 Enlarged prostate with lower urinary tract symptoms: Secondary | ICD-10-CM

## 2021-03-13 LAB — URINALYSIS, ROUTINE W REFLEX MICROSCOPIC
Bilirubin, UA: NEGATIVE
Glucose, UA: NEGATIVE
Ketones, UA: NEGATIVE
Leukocytes,UA: NEGATIVE
Nitrite, UA: NEGATIVE
Protein,UA: NEGATIVE
RBC, UA: NEGATIVE
Specific Gravity, UA: 1.015 (ref 1.005–1.030)
Urobilinogen, Ur: 0.2 mg/dL (ref 0.2–1.0)
pH, UA: 6 (ref 5.0–7.5)

## 2021-03-13 MED ORDER — ALFUZOSIN HCL ER 10 MG PO TB24: 10.0000 mg | ORAL_TABLET | Freq: Every day | ORAL | 3 refills | Status: DC

## 2021-03-13 NOTE — Progress Notes (Signed)
post void residual=154  Urological Symptom Review  Patient is experiencing the following symptoms: none   Review of Systems  Gastrointestinal (upper)  : Negative for upper GI symptoms  Gastrointestinal (lower) : Negative for lower GI symptoms  Constitutional : Negative for symptoms  Skin: Negative for skin symptoms  Eyes: Negative for eye symptoms  Ear/Nose/Throat : Negative for Ear/Nose/Throat symptoms  Hematologic/Lymphatic: Negative for Hematologic/Lymphatic symptoms  Cardiovascular : Negative for cardiovascular symptoms  Respiratory : Negative for respiratory symptoms  Endocrine: Negative for endocrine symptoms  Musculoskeletal: Negative for musculoskeletal symptoms  Neurological: Negative for neurological symptoms  Psychologic: Negative for psychiatric symptoms

## 2021-03-13 NOTE — Progress Notes (Signed)
03/13/2021 12:19 PM   Kerry Solis [birth year] [id number]  Referring provider: Kathyrn Drown, MD [place],  [place] [address]  Followup prostate cancer and BPH  HPI: Kerry Solis is a 75yo here for followup for prostate cancer and BPH. PSA <0.01 in 12/2020. He had mild LUTS on uroxatral 10mg  QHS. IPSS 7 QOL 1. Nocturia 0-1x. Urine stream strong. No other compaints today.    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   Depression    Elevated liver enzymes    Glaucoma    High triglycerides    History of gout    HIV (human immunodeficiency virus infection) ([place])    Hypertension    Leukopenia    Low CD4   Multiple sclerosis ([place])    uses a cane   Myocardial infarction ([place])    45 years ago   Prostate cancer [facility])     Surgical History: Past Surgical History:  Procedure Laterality Date   BIOPSY  08/14/2016   Procedure: BIOPSY;  Surgeon: Danie Binder, MD;  [facility];  Service: Endoscopy;;  gastric bx's   CHOLECYSTECTOMY  06/10/2011   Procedure: LAPAROSCOPIC CHOLECYSTECTOMY;  Surgeon: Donato Heinz, MD;  Location: [facility] ORS;  Service: General;  Laterality: N/A;   COLONOSCOPY  03/10/2009   JIR:CVELFYBO internal hemorrhoids/6-mm sessile ascending colon polyp/tortuous colon, diverticulosis. TA   COLONOSCOPY WITH PROPOFOL N/A 04/19/2015   Dr. Oneida Alar: sessile serrated adenoma, surveillance in 5 years    ESOPHAGOGASTRODUODENOSCOPY  06/23/2008   FBP:ZWCHENI gastritis/schatzki ring   ESOPHAGOGASTRODUODENOSCOPY (EGD) WITH PROPOFOL N/A 08/14/2016   Dr. Oneida Alar: moderate Schatzi's ring s/p dilation, LA Grade A esophagitis,small hiatal hernia, chronic gastritis (negative H.pylori), one duodenal diverticulum   LAPAROSCOPIC APPENDECTOMY N/A 12/13/2017   Procedure: APPENDECTOMY LAPAROSCOPIC;  Surgeon: Aviva Signs, MD;  Location: [facility] OR[facility];  Service: General;  Laterality:  N/A;   NECK SURGERY  unsure   disc   POLYPECTOMY N/A 04/19/2015   Procedure: POLYPECTOMY;  Surgeon: Danie Binder, MD;  Location: [facility];  Service: Endoscopy;  Laterality: N/A;  ascending colon   PROSTATE BIOPSY     RADIOACTIVE SEED IMPLANT N/A 11/29/2016   Procedure: RADIOACTIVE SEED IMPLANT/BRACHYTHERAPY IMPLANT;  Surgeon: Franchot Gallo, MD;  Location: [facility];  Service: Urology;  Laterality: N/A;  81 seeds implanted   SAVORY DILATION N/A 08/14/2016   Procedure: SAVORY DILATION;  Surgeon: Danie Binder, MD;  [facility];  Service: Endoscopy;  Laterality: N/A;    Home Medications:  Allergies as of 03/13/2021       Reactions   Yellow Jacket Venom Swelling        Medication List        Accurate as of March 13, 2021 12:19 PM. If you have any questions, ask your nurse or doctor.          acetaminophen 325 MG tablet Commonly known as: TYLENOL Take 325 mg by mouth every 4 (four) hours as needed for mild pain.   alfuzosin 10 MG 24 hr tablet Commonly known as: UROXATRAL Take 1 tablet (10 mg total) by mouth at bedtime.   allopurinol 100 MG tablet Commonly known as: ZYLOPRIM Take 1 tablet (100 mg total) by mouth daily.   amitriptyline 25 MG tablet Commonly known as: ELAVIL Take by mouth.   amLODipine 5 MG tablet Commonly  known as: NORVASC TAKE 1 TABLET(5 MG) BY MOUTH DAILY   Descovy 200-25 MG tablet Generic drug: emtricitabine-tenofovir AF Take 1 tablet by mouth daily.   docusate sodium 100 MG capsule Commonly known as: COLACE Take 1 capsule (100 mg total) by mouth 2 (two) times daily as needed for mild constipation (When taking pain medicine.).   gabapentin 100 MG capsule Commonly known as: NEURONTIN Take 100 mg by mouth 3 (three) times daily.   ondansetron 4 MG disintegrating tablet Commonly known as: ZOFRAN-ODT Take 1 tablet (4 mg total) by mouth every 8 (eight) hours as needed for nausea or vomiting.   pantoprazole 40  MG tablet Commonly known as: PROTONIX TAKE 1 TABLET(40 MG) BY MOUTH DAILY   pravastatin 40 MG tablet Commonly known as: PRAVACHOL Take 1 tablet (40 mg total) by mouth daily.   tamsulosin 0.4 MG Caps capsule Commonly known as: FLOMAX TAKE 1 CAPSULE BY MOUTH AT BEDTIME FOR PROSTATE URINE FLOW   Tivicay 50 MG tablet Generic drug: dolutegravir Take 50 mg by mouth daily.        Allergies:  Allergies  Allergen Reactions   Yellow Jacket Venom Swelling    Family History: Family History  Problem Relation Age of Onset   Hypertension Mother    Heart attack Mother    Heart attack Father    Cancer Sister        Breast   Diabetes Sister    Diabetes Brother    Cancer Brother        unknown   Cancer Brother        colon   Cancer Sister        breast   Cancer Brother        prostate   Cancer Brother        colon    Social History:  reports that he has never smoked. He has never used smokeless tobacco. He reports that he does not drink alcohol and does not use drugs.  ROS: All other review of systems were reviewed and are negative except what is noted above in HPI  Physical Exam: BP (!) 149/77   Pulse 90   Constitutional:  Alert and oriented, No acute distress. HEENT: [place] AT, moist mucus membranes.  Trachea midline, no masses. Cardiovascular: No clubbing, cyanosis, or edema. Respiratory: Normal respiratory effort, no increased work of breathing. GI: Abdomen is soft, nontender, nondistended, no abdominal masses GU: No CVA tenderness.  Lymph: No cervical or inguinal lymphadenopathy. Skin: No rashes, bruises or suspicious lesions. Neurologic: Grossly intact, no focal deficits, moving all 4 extremities. Psychiatric: Normal mood and affect.  Laboratory Data: Lab Results  Component Value Date   WBC 10.5 10/13/2020   HGB 14.0 10/13/2020   HCT 46.3 10/13/2020   MCV 92.6 10/13/2020   PLT 266 10/13/2020    Lab Results  Component Value Date   CREATININE 1.40 (H)  03/07/2021    Lab Results  Component Value Date   PSA 2.39 04/30/2014   PSA 1.89 04/14/2013    No results found for: TESTOSTERONE  Lab Results  Component Value Date   HGBA1C 7.3 (H) 03/07/2021    Urinalysis    Component Value Date/Time   COLORURINE STRAW (A) 10/13/2020 0914   APPEARANCEUR Cloudy (A) 12/27/2020 1430   LABSPEC 1.011 10/13/2020 0914   PHURINE 7.0 10/13/2020 0914   GLUCOSEU Negative 12/27/2020 1430   HGBUR NEGATIVE 10/13/2020 0914   BILIRUBINUR Negative 12/27/2020 [place] 10/13/2020 0914  PROTEINUR 1+ (A) 12/27/2020 1430   PROTEINUR 30 (A) 10/13/2020 0914   UROBILINOGEN 0.2 10/28/2012 1444   NITRITE Negative 12/27/2020 1430   NITRITE NEGATIVE 10/13/2020 0914   LEUKOCYTESUR 2+ (A) 12/27/2020 1430   LEUKOCYTESUR SMALL (A) 10/13/2020 0914    Lab Results  Component Value Date   LABMICR 28.2 03/07/2021   WBCUA >30 (A) 12/27/2020   LABEPIT None seen 12/27/2020   MUCUS Present 12/27/2020   BACTERIA Few 12/27/2020    Pertinent Imaging:  No results found for this or any previous visit.  No results found for this or any previous visit.  No results found for this or any previous visit.  No results found for this or any previous visit.  No results found for this or any previous visit.  No results found for this or any previous visit.  No results found for this or any previous visit.  No results found for this or any previous visit.   Assessment & Plan:    1. Prostate cancer ([place]) -RTC 6 months with PSA  2. Benign prostatic hyperplasia with urinary obstruction -Continue uroxatral 10mg  qhs - BLADDER SCAN AMB NON-IMAGING - Urinalysis, Routine w reflex microscopic  3. Nocturia -Continue uroxatral 10mg    No follow-ups on file.  Nicolette Bang, MD  [facility] [place]

## 2021-03-13 NOTE — Patient Instructions (Signed)
Benign Prostatic Hyperplasia Benign prostatic hyperplasia (BPH) is an enlarged prostate gland that is caused by the normal aging process and not by cancer. The prostate is a walnut-sized gland that is involved in the production of semen. It is located in front of the rectum and below the bladder. The bladder stores urine and the urethra is the tube that carries the urine out of the body. The prostate may get bigger as a man gets older. An enlarged prostate can press on the urethra. This can make it harder to pass urine. The build-up of urine in the bladder can cause infection. Back pressure and infection may progress to bladder damage and kidney (renal) failure. What are the causes? This condition is part of a normal aging process. However, not all men develop problems from this condition. If the prostate enlarges away from the urethra, urine flow will not be blocked. If it enlarges toward the urethra and compresses it, there will be problems passing urine. What increases the risk? This condition is more likely to develop in men over the age of 50 years. What are the signs or symptoms? Symptoms of this condition include: Getting up often during the night to urinate. Needing to urinate frequently during the day. Difficulty starting urine flow. Decrease in size and strength of your urine stream. Leaking (dribbling) after urinating. Inability to pass urine. This needs immediate treatment. Inability to completely empty your bladder. Pain when you pass urine. This is more common if there is also an infection. Urinary tract infection (UTI). How is this diagnosed? This condition is diagnosed based on your medical history, a physical exam, and your symptoms. Tests will also be done, such as: A post-void bladder scan. This measures any amount of urine that may remain in your bladder after you finish urinating. A digital rectal exam. In a rectal exam, your health care provider checks your prostate by  putting a lubricated, gloved finger into your rectum to feel the back of your prostate gland. This exam detects the size of your gland and any abnormal lumps or growths. An exam of your urine (urinalysis). A prostate specific antigen (PSA) screening. This is a blood test used to screen for prostate cancer. An ultrasound. This test uses sound waves to electronically produce a picture of your prostate gland. Your health care provider may refer you to a specialist in kidney and prostate diseases (urologist). How is this treated? Once symptoms begin, your health care provider will monitor your condition (active surveillance or watchful waiting). Treatment for this condition will depend on the severity of your condition. Treatment may include: Observation and yearly exams. This may be the only treatment needed if your condition and symptoms are mild. Medicines to relieve your symptoms, including: Medicines to shrink the prostate. Medicines to relax the muscle of the prostate. Surgery in severe cases. Surgery may include: Prostatectomy. In this procedure, the prostate tissue is removed completely through an open incision or with a laparoscope or robotics. Transurethral resection of the prostate (TURP). In this procedure, a tool is inserted through the opening at the tip of the penis (urethra). It is used to cut away tissue of the inner core of the prostate. The pieces are removed through the same opening of the penis. This removes the blockage. Transurethral incision (TUIP). In this procedure, small cuts are made in the prostate. This lessens the prostate's pressure on the urethra. Transurethral microwave thermotherapy (TUMT). This procedure uses microwaves to create heat. The heat destroys and removes a   small amount of prostate tissue. Transurethral needle ablation (TUNA). This procedure uses radio frequencies to destroy and remove a small amount of prostate tissue. Interstitial laser coagulation (ILC).  This procedure uses a laser to destroy and remove a small amount of prostate tissue. Transurethral electrovaporization (TUVP). This procedure uses electrodes to destroy and remove a small amount of prostate tissue. Prostatic urethral lift. This procedure inserts an implant to push the lobes of the prostate away from the urethra. Follow these instructions at home: Take over-the-counter and prescription medicines only as told by your health care provider. Monitor your symptoms for any changes. Contact your health care provider with any changes. Avoid drinking large amounts of liquid before going to bed or out in public. Avoid or reduce how much caffeine or alcohol you drink. Give yourself time when you urinate. Keep all follow-up visits as told by your health care provider. This is important. Contact a health care provider if: You have unexplained back pain. Your symptoms do not get better with treatment. You develop side effects from the medicine you are taking. Your urine becomes very dark or has a bad smell. Your lower abdomen becomes distended and you have trouble passing your urine. Get help right away if: You have a fever or chills. You suddenly cannot urinate. You feel lightheaded, or very dizzy, or you faint. There are large amounts of blood or clots in the urine. Your urinary problems become hard to manage. You develop moderate to severe low back or flank pain. The flank is the side of your body between the ribs and the hip. These symptoms may represent a serious problem that is an emergency. Do not wait to see if the symptoms will go away. Get medical help right away. Call your local emergency services (911 in the [place] Do not drive yourself to the hospital. Summary Benign prostatic hyperplasia (BPH) is an enlarged prostate that is caused by the normal aging process and not by cancer. An enlarged prostate can press on the urethra. This can make it hard to pass urine. This  condition is part of a normal aging process and is more likely to develop in men over the age of 50 years. Get help right away if you suddenly cannot urinate. This information is not intended to replace advice given to you by your health care provider. Make sure you discuss any questions you have with your health care provider. Document Revised: 09/14/2020 Document Reviewed: 02/11/2020 Elsevier Patient Education  2022 [employer]  

## 2021-03-17 NOTE — Addendum Note (Signed)
Addended by: Dairl Ponder on: 03/17/2021 11:29 AM   Modules accepted: Orders

## 2021-03-22 DIAGNOSIS — I10 Essential (primary) hypertension: Secondary | ICD-10-CM | POA: Diagnosis not present

## 2021-03-22 DIAGNOSIS — G35 Multiple sclerosis: Secondary | ICD-10-CM | POA: Diagnosis not present

## 2021-03-22 DIAGNOSIS — K0889 Other specified disorders of teeth and supporting structures: Secondary | ICD-10-CM | POA: Diagnosis not present

## 2021-03-22 DIAGNOSIS — M1A0710 Idiopathic chronic gout, right ankle and foot, without tophus (tophi): Secondary | ICD-10-CM | POA: Diagnosis not present

## 2021-03-22 DIAGNOSIS — Z79899 Other long term (current) drug therapy: Secondary | ICD-10-CM | POA: Diagnosis not present

## 2021-04-17 ENCOUNTER — Other Ambulatory Visit: Payer: Self-pay | Admitting: Family Medicine

## 2021-04-25 ENCOUNTER — Other Ambulatory Visit: Payer: Self-pay | Admitting: Family Medicine

## 2021-04-25 NOTE — Telephone Encounter (Signed)
Patient came to window and stated he is having difficulty getting his prescriptions filled at [facility] and wants all of his meds to be filled at [facility] in [place]. He is out of his blood pressure medication and needs that refilled. Please advise.  CB#  [phone] - leave message if no answer per patient.

## 2021-04-26 NOTE — Telephone Encounter (Signed)
FYI Pt came by window again about meds. I explained it would take 24-48 hours to fill but he was concerned because he is completley out of hi blood pressure med. See previous message, Anders Simmonds says he has not refills, so he wants to use [facility] in [place] as his main pharmacy.

## 2021-04-28 ENCOUNTER — Other Ambulatory Visit: Payer: Self-pay | Admitting: Family Medicine

## 2021-05-09 ENCOUNTER — Telehealth: Payer: Self-pay | Admitting: Family Medicine

## 2021-05-09 NOTE — Telephone Encounter (Signed)
  Left message for patient to call back and schedule Medicare Annual Wellness Visit (AWV) in office.   If unable to come into the office for AWV,  please offer to do virtually or by telephone.  No hx of AWV eligible for AWVI as of due 06/18/2009 awvi per [facility]  Please schedule at anytime with [facility] Health Advisor.      40 Minutes appointment   Any questions, please call me at [phone]

## 2021-05-15 ENCOUNTER — Ambulatory Visit: Payer: Medicare Other | Admitting: Family Medicine

## 2021-05-16 ENCOUNTER — Ambulatory Visit: Payer: Medicare Other

## 2021-05-18 ENCOUNTER — Other Ambulatory Visit: Payer: Self-pay | Admitting: Family Medicine

## 2021-05-23 ENCOUNTER — Ambulatory Visit: Payer: Medicare Other

## 2021-05-29 DIAGNOSIS — G35 Multiple sclerosis: Secondary | ICD-10-CM | POA: Diagnosis not present

## 2021-05-30 ENCOUNTER — Other Ambulatory Visit: Payer: Self-pay

## 2021-05-30 ENCOUNTER — Ambulatory Visit (INDEPENDENT_AMBULATORY_CARE_PROVIDER_SITE_OTHER): Payer: Medicare Other

## 2021-05-30 VITALS — BP 142/86 | HR 77 | Ht 68.0 in | Wt 220.0 lb

## 2021-05-30 DIAGNOSIS — Z1211 Encounter for screening for malignant neoplasm of colon: Secondary | ICD-10-CM | POA: Diagnosis not present

## 2021-05-30 DIAGNOSIS — Z0000 Encounter for general adult medical examination without abnormal findings: Secondary | ICD-10-CM

## 2021-05-30 DIAGNOSIS — A630 Anogenital (venereal) warts: Secondary | ICD-10-CM | POA: Insufficient documentation

## 2021-05-30 DIAGNOSIS — B451 Cerebral cryptococcosis: Secondary | ICD-10-CM | POA: Insufficient documentation

## 2021-05-30 DIAGNOSIS — M1A0710 Idiopathic chronic gout, right ankle and foot, without tophus (tophi): Secondary | ICD-10-CM | POA: Insufficient documentation

## 2021-05-30 NOTE — Patient Instructions (Signed)
Kerry Solis , Thank you for taking time to come for your Medicare Wellness Visit. I appreciate your ongoing commitment to your health goals. Please review the following plan we discussed and let me know if I can assist you in the future.   Screening recommendations/referrals: Colonoscopy: Order placed today to see Dr. Sydell Axon.  Recommended yearly ophthalmology/optometry visit for glaucoma screening and checkup Recommended yearly dental visit for hygiene and checkup  Vaccinations: Influenza vaccine: Done 03/07/2021 Repeat annually  Pneumococcal vaccine: Done 03/19/2011 and 03/30/2015 Tdap vaccine: Due Repeat in 10 years  Shingles vaccine: Done 12/05/2018 and 02/02/2019   Covid-19: Done 08/18/2019 and 09/22/2019  Advanced directives: Please bring a copy of your health care power of attorney and living will to the office to be added to your chart at your convenience.   Conditions/risks identified: Aim for 30 minutes of exercise or walking each day, drink 6-8 glasses of water and eat lots of fruits and vegetables.   Next appointment: Follow up in one year for your annual wellness visit. 2023.  Preventive Care 73 Years and Older, Male  Preventive care refers to lifestyle choices and visits with your health care provider that can promote health and wellness. What does preventive care include? A yearly physical exam. This is also called an annual well check. Dental exams once or twice a year. Routine eye exams. Ask your health care provider how often you should have your eyes checked. Personal lifestyle choices, including: Daily care of your teeth and gums. Regular physical activity. Eating a healthy diet. Avoiding tobacco and drug use. Limiting alcohol use. Practicing safe sex. Taking low doses of aspirin every day. Taking vitamin and mineral supplements as recommended by your health care provider. What happens during an annual well check? The services and screenings done by your health  care provider during your annual well check will depend on your age, overall health, lifestyle risk factors, and family history of disease. Counseling  Your health care provider may ask you questions about your: Alcohol use. Tobacco use. Drug use. Emotional well-being. Home and relationship well-being. Sexual activity. Eating habits. History of falls. Memory and ability to understand (cognition). Work and work Statistician. Screening  You may have the following tests or measurements: Height, weight, and BMI. Blood pressure. Lipid and cholesterol levels. These may be checked every 5 years, or more frequently if you are over 31 years old. Skin check. Lung cancer screening. You may have this screening every year starting at age 43 if you have a 30-pack-year history of smoking and currently smoke or have quit within the past 15 years. Fecal occult blood test (FOBT) of the stool. You may have this test every year starting at age 67. Flexible sigmoidoscopy or colonoscopy. You may have a sigmoidoscopy every 5 years or a colonoscopy every 10 years starting at age 35. Prostate cancer screening. Recommendations will vary depending on your family history and other risks. Hepatitis C blood test. Hepatitis B blood test. Sexually transmitted disease (STD) testing. Diabetes screening. This is done by checking your blood sugar (glucose) after you have not eaten for a while (fasting). You may have this done every 1-3 years. Abdominal aortic aneurysm (AAA) screening. You may need this if you are a current or former smoker. Osteoporosis. You may be screened starting at age 75 if you are at high risk. Talk with your health care provider about your test results, treatment options, and if necessary, the need for more tests. Vaccines  Your health care provider  may recommend certain vaccines, such as: Influenza vaccine. This is recommended every year. Tetanus, diphtheria, and acellular pertussis (Tdap, Td)  vaccine. You may need a Td booster every 10 years. Zoster vaccine. You may need this after age 72. Pneumococcal 13-valent conjugate (PCV13) vaccine. One dose is recommended after age 49. Pneumococcal polysaccharide (PPSV23) vaccine. One dose is recommended after age 42. Talk to your health care provider about which screenings and vaccines you need and how often you need them. This information is not intended to replace advice given to you by your health care provider. Make sure you discuss any questions you have with your health care provider. Document Released: 07/01/2015 Document Revised: 02/22/2016 Document Reviewed: 04/05/2015 Elsevier Interactive Patient Education  2017 [place] Prevention in the Home Falls can cause injuries. They can happen to people of all ages. There are many things you can do to make your home safe and to help prevent falls. What can I do on the outside of my home? Regularly fix the edges of walkways and driveways and fix any cracks. Remove anything that might make you trip as you walk through a door, such as a raised step or threshold. Trim any bushes or trees on the path to your home. Use bright outdoor lighting. Clear any walking paths of anything that might make someone trip, such as rocks or tools. Regularly check to see if handrails are loose or broken. Make sure that both sides of any steps have handrails. Any raised decks and porches should have guardrails on the edges. Have any leaves, snow, or ice cleared regularly. Use sand or salt on walking paths during winter. Clean up any spills in your garage right away. This includes oil or grease spills. What can I do in the bathroom? Use night lights. Install grab bars by the toilet and in the tub and shower. Do not use towel bars as grab bars. Use non-skid mats or decals in the tub or shower. If you need to sit down in the shower, use a plastic, non-slip stool. Keep the floor dry. Clean up any  water that spills on the floor as soon as it happens. Remove soap buildup in the tub or shower regularly. Attach bath mats securely with double-sided non-slip rug tape. Do not have throw rugs and other things on the floor that can make you trip. What can I do in the bedroom? Use night lights. Make sure that you have a light by your bed that is easy to reach. Do not use any sheets or blankets that are too big for your bed. They should not hang down onto the floor. Have a firm chair that has side arms. You can use this for support while you get dressed. Do not have throw rugs and other things on the floor that can make you trip. What can I do in the kitchen? Clean up any spills right away. Avoid walking on wet floors. Keep items that you use a lot in easy-to-reach places. If you need to reach something above you, use a strong step stool that has a grab bar. Keep electrical cords out of the way. Do not use floor polish or wax that makes floors slippery. If you must use wax, use non-skid floor wax. Do not have throw rugs and other things on the floor that can make you trip. What can I do with my stairs? Do not leave any items on the stairs. Make sure that there are handrails on both sides  of the stairs and use them. Fix handrails that are broken or loose. Make sure that handrails are as long as the stairways. Check any carpeting to make sure that it is firmly attached to the stairs. Fix any carpet that is loose or worn. Avoid having throw rugs at the top or bottom of the stairs. If you do have throw rugs, attach them to the floor with carpet tape. Make sure that you have a light switch at the top of the stairs and the bottom of the stairs. If you do not have them, ask someone to add them for you. What else can I do to help prevent falls? Wear shoes that: Do not have high heels. Have rubber bottoms. Are comfortable and fit you well. Are closed at the toe. Do not wear sandals. If you use a  stepladder: Make sure that it is fully opened. Do not climb a closed stepladder. Make sure that both sides of the stepladder are locked into place. Ask someone to hold it for you, if possible. Clearly mark and make sure that you can see: Any grab bars or handrails. First and last steps. Where the edge of each step is. Use tools that help you move around (mobility aids) if they are needed. These include: Canes. Walkers. Scooters. Crutches. Turn on the lights when you go into a dark area. Replace any light bulbs as soon as they burn out. Set up your furniture so you have a clear path. Avoid moving your furniture around. If any of your floors are uneven, fix them. If there are any pets around you, be aware of where they are. Review your medicines with your doctor. Some medicines can make you feel dizzy. This can increase your chance of falling. Ask your doctor what other things that you can do to help prevent falls. This information is not intended to replace advice given to you by your health care provider. Make sure you discuss any questions you have with your health care provider. Document Released: 03/31/2009 Document Revised: 11/10/2015 Document Reviewed: 07/09/2014 Elsevier Interactive Patient Education  2017 [employer].

## 2021-05-30 NOTE — Progress Notes (Signed)
Subjective:   Kerry Solis is a 75 y.o. male who presents for an Initial Medicare Annual Wellness Visit.  Review of Systems     Cardiac Risk Factors include: advanced age (>92men, >8 women);dyslipidemia;male gender;hypertension;sedentary lifestyle;obesity (BMI >30kg/m2)     Objective:    Today's Vitals   05/30/21 1307  BP: (!) 142/86  Pulse: 77  SpO2: 99%  Weight: 220 lb (99.8 kg)  Height: 5\' 8"  (1.727 m)   Body mass index is 33.45 kg/m.  Advanced Directives 05/30/2021 10/13/2020 07/14/2020 07/10/2020 12/13/2017 09/05/2017 11/29/2016  Does Patient Have a Medical Advance Directive? Yes No No Yes No No Yes  Type of Paramedic of [place];Living will - - [place];Living will  Does patient want to make changes to medical advance directive? - - - - - - No - Patient declined  Copy of [place] in Chart? No - copy requested - - - - - No - copy requested  Would patient like information on creating a medical advance directive? No - Patient declined - - - No - Patient declined No - Patient declined -  Pre-existing out of facility DNR order (yellow form or pink MOST form) - - - - - - -    Current Medications (verified) Outpatient Encounter Medications as of 05/30/2021  Medication Sig   acetaminophen (TYLENOL) 325 MG tablet Take 325 mg by mouth every 4 (four) hours as needed for mild pain.   alfuzosin (UROXATRAL) 10 MG 24 hr tablet Take 1 tablet (10 mg total) by mouth at bedtime.   allopurinol (ZYLOPRIM) 100 MG tablet Take 1 tablet (100 mg total) by mouth daily.   amitriptyline (ELAVIL) 25 MG tablet Take by mouth.   amLODipine (NORVASC) 5 MG tablet TAKE 1 TABLET(5 MG) BY MOUTH DAILY   DESCOVY 200-25 MG tablet Take 1 tablet by mouth daily.   docusate sodium (COLACE) 100 MG capsule Take 1 capsule (100 mg total) by mouth 2 (two) times daily as needed for mild constipation (When taking pain  medicine.).   gabapentin (NEURONTIN) 100 MG capsule Take 100 mg by mouth 3 (three) times daily.   ondansetron (ZOFRAN-ODT) 4 MG disintegrating tablet Take 1 tablet (4 mg total) by mouth every 8 (eight) hours as needed for nausea or vomiting.   pantoprazole (PROTONIX) 40 MG tablet TAKE 1 TABLET(40 MG) BY MOUTH DAILY   TIVICAY 50 MG tablet Take 50 mg by mouth daily.   No facility-administered encounter medications on file as of 05/30/2021.    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   Depression    Elevated liver enzymes    Glaucoma    High triglycerides    History of gout    HIV (human immunodeficiency virus infection) ([place])    Hypertension    Leukopenia    Low CD4   Multiple sclerosis ([place])    uses a cane   Myocardial infarction ([place])    45 years ago   Prostate cancer [facility])    Past Surgical History:  Procedure Laterality Date   BIOPSY  08/14/2016   Procedure: BIOPSY;  Surgeon: Danie Binder, MD;  [facility];  Service: Endoscopy;;  gastric bx's   CHOLECYSTECTOMY  06/10/2011   Procedure: LAPAROSCOPIC CHOLECYSTECTOMY;  Surgeon: Donato Heinz, MD;  Location: [facility] OR[facility];  Service: General;  Laterality: N/A;   COLONOSCOPY  03/10/2009   AJO:INOMVEHM internal hemorrhoids/6-mm sessile ascending colon polyp/tortuous colon, diverticulosis. TA   COLONOSCOPY WITH PROPOFOL N/A 04/19/2015   Dr. Oneida Alar: sessile serrated adenoma, surveillance in 5 years    ESOPHAGOGASTRODUODENOSCOPY  06/23/2008   CNO:[id number] gastritis/schatzki ring   ESOPHAGOGASTRODUODENOSCOPY (EGD) WITH PROPOFOL N/A 08/14/2016   Dr. Oneida Alar: moderate Schatzi's ring s/p dilation, LA Grade A esophagitis,small hiatal hernia, chronic gastritis (negative H.pylori), one duodenal diverticulum   LAPAROSCOPIC APPENDECTOMY N/A 12/13/2017   Procedure: APPENDECTOMY  LAPAROSCOPIC;  Surgeon: Aviva Signs, MD;  Location: [facility];  Service: General;  Laterality: N/A;   NECK SURGERY  unsure   disc   POLYPECTOMY N/A 04/19/2015   Procedure: POLYPECTOMY;  Surgeon: Danie Binder, MD;  Location: [facility];  Service: Endoscopy;  Laterality: N/A;  ascending colon   PROSTATE BIOPSY     RADIOACTIVE SEED IMPLANT N/A 11/29/2016   Procedure: RADIOACTIVE SEED IMPLANT/BRACHYTHERAPY IMPLANT;  Surgeon: Franchot Gallo, MD;  Location: [facility];  Service: Urology;  Laterality: N/A;  81 seeds implanted   SAVORY DILATION N/A 08/14/2016   Procedure: SAVORY DILATION;  Surgeon: Danie Binder, MD;  [facility];  Service: Endoscopy;  Laterality: N/A;   Family History  Problem Relation Age of Onset   Hypertension Mother    Heart attack Mother    Heart attack Father    Cancer Sister        Breast   Diabetes Sister    Diabetes Brother    Cancer Brother        unknown   Cancer Brother        colon   Cancer Sister        breast   Cancer Brother        prostate   Cancer Brother        colon   Social History   Socioeconomic History   Marital status: Divorced    Spouse name: Not on file   Number of children: Not on file   Years of education: Not on file   Highest education level: Not on file  Occupational History   Not on file  Tobacco Use   Smoking status: Never   Smokeless tobacco: Never  Vaping Use   Vaping Use: Never used  Substance and Sexual Activity   Alcohol use: No   Drug use: No   Sexual activity: Never    Birth control/protection: Abstinence  Other Topics Concern   Not on file  Social History Narrative   Not on file   Social Determinants of Health   Financial Resource Strain: Low Risk    Difficulty of Paying Living Expenses: Not hard at all  Food Insecurity: No Food Insecurity   Worried About Charity fundraiser in the Last Year: Never true   [place] in the Last Year: Never true  Transportation Needs: No  Transportation Needs   Lack of Transportation (Medical): No   Lack of Transportation (Non-Medical): No  Physical Activity: Insufficiently Active   Days of Exercise per Week: 5 days   Minutes of Exercise per Session: 10 min  Stress: Stress Concern Present   Feeling of Stress : To some extent  Social Connections: Moderately Integrated   Frequency of Communication with Friends and Family: More than three times a week   Frequency of Social Gatherings with Friends and Family: Three times a  week   Attends Religious Services: More than 4 times per year   Active Member of Clubs or Organizations: Yes   Attends Music therapist: More than 4 times per year   Marital Status: Divorced    Tobacco Counseling Counseling given: Not Answered   Clinical Intake:  Pre-visit preparation completed: Yes  Pain : No/denies pain     BMI - recorded: 33.45 Nutritional Status: BMI > 30  Obese Nutritional Risks: None Diabetes: No  How often do you need to have someone help you when you read instructions, pamphlets, or other written materials from your doctor or pharmacy?: 1 - Never  Diabetic?No. Patient states he is not diabetic. No medications and does not check blood sugars.  Interpreter Needed?: No  Information entered by :: MJ Lacreasha Hinds,LPN   Activities of Daily Living In your present state of health, do you have any difficulty performing the following activities: 05/30/2021  Hearing? 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  Difficulty concentrating or making decisions? Y  Walking or climbing stairs? N  Dressing or bathing? N  Doing errands, shopping? N  Preparing Food and eating ? N  Using the Toilet? N  In the past six months, have you accidently leaked urine? Y  Comment Occasional  Do you have problems with loss of bowel control? N  Managing your Medications? N  Managing your Finances? N  Some recent data might be hidden    Patient Care Team: Kathyrn Drown, MD as PCP  - General (Family Medicine) Danie Binder, MD (Inactive) as Consulting Physician (Gastroenterology) Eloise Harman, DO as Consulting Physician (Internal Medicine) Kassie Mends, RN as [place] any recent [place] you may have received from other than [facility] providers in the past year (date may be approximate).     Assessment:   This is a routine wellness examination for [place].  Hearing/Vision screen Hearing Screening - Comments:: No hearing issues. Vision Screening - Comments:: Glasses. [employer] 2021.  Dietary issues and exercise activities discussed: Current Exercise Habits: Home exercise routine, Type of exercise: walking, Time (Minutes): 10, Frequency (Times/Week): 5, Weekly Exercise (Minutes/Week): 50, Intensity: Mild, Exercise limited by: cardiac condition(s);neurologic condition(s)   Goals Addressed             This Visit's Progress    DIET - REDUCE CALORIE INTAKE       Pt states he would like to lose weight.       Depression Screen PHQ 2/9 Scores 05/30/2021 03/07/2021 01/10/2021 08/08/2020 04/11/2020 12/21/2016 07/30/2016  PHQ - 2 Score 2 0 1 3 6  0 0  PHQ- 9 Score 2 - 9 18 16  - -    Fall Risk Fall Risk  05/30/2021 03/07/2021 01/10/2021 10/28/2020 10/06/2019  Falls in the past year? 1 0 0 0 1  Comment - - - - -  Number falls in past yr: 1 - 0 - 1  Comment - - - - -  Injury with Fall? 0 - 0 - 0  Comment - - - - -  Risk Factor Category  - - - - -  Risk for fall due to : Impaired balance/gait;Impaired mobility;Impaired vision;History of fall(s) Impaired balance/gait;Impaired mobility No Fall Risks - Impaired balance/gait;Impaired mobility  Risk for fall due to: Comment - - - - -  Follow up Falls prevention discussed Falls evaluation completed Falls evaluation completed Falls evaluation completed Falls evaluation completed;Education provided    FALL RISK  PREVENTION PERTAINING TO THE HOME:  Any stairs in or around the  home? Yes  If so, are there any without handrails? No  Home free of loose throw rugs in walkways, pet beds, electrical cords, etc? Yes  Adequate lighting in your home to reduce risk of falls? Yes   ASSISTIVE DEVICES UTILIZED TO PREVENT FALLS:  Life alert? No  Use of a cane, walker or w/c? Yes  Grab bars in the bathroom? Yes  Shower chair or bench in shower? No  Elevated toilet seat or a handicapped toilet? Yes   TIMED UP AND GO:  Was the test performed? Yes .  Length of time to ambulate 10 feet: 12 sec.   Gait slow and steady with assistive device  Cognitive Function:   Montreal Cognitive Assessment  01/23/2020  Visuospatial/ Executive (0/5) 2  Naming (0/3) 3  Attention: Read list of digits (0/2) 2  Attention: Read list of letters (0/1) 1  Attention: Serial 7 subtraction starting at 100 (0/3) 1  Language: Repeat phrase (0/2) 2  Language : Fluency (0/1) 1  Abstraction (0/2) 1  Delayed Recall (0/5) 0  Orientation (0/6) 3  Total 16  Adjusted Score (based on education) 17   6CIT Screen 05/30/2021  What Year? 0 points  What month? 0 points  What time? 0 points  Count back from 20 0 points  Months in reverse 4 points  Repeat phrase 2 points  Total Score 6    Immunizations Immunization History  Administered Date(s) Administered   Fluad Quad(high Dose 65+) 03/14/2020, 04/11/2020, 03/07/2021   Hep A / Hep B 08/09/2010   Influenza Whole 03/19/2011   Influenza,inj,Quad PF,6+ Mos 03/11/2013, 04/30/2014, 03/08/2015, 03/23/2016, 02/28/2017, 03/14/2018, 03/19/2019   Influenza-Unspecified 04/02/2012, 03/17/2013, 02/24/2014, 03/16/2015, 03/29/2016   Moderna Sars-Covid-2 Vaccination 08/18/2019, 09/22/2019   Pneumococcal Conjugate-13 01/25/2014, 03/30/2015   Pneumococcal Polysaccharide-23 03/19/2011   Zoster Recombinat (Shingrix) 12/05/2018, 02/02/2019    TDAP status: Due, Education has been provided regarding the importance of this vaccine. Advised may receive this vaccine at  local pharmacy or Health Dept. Aware to provide a copy of the vaccination record if obtained from local pharmacy or Health Dept. Verbalized acceptance and understanding.  Flu Vaccine status: Up to date  Pneumococcal vaccine status: Up to date  Covid-19 vaccine status: Completed vaccines  Qualifies for Shingles Vaccine? Yes   Zostavax completed Yes   Shingrix Completed?: Yes  Screening Tests Health Maintenance  Topic Date Due   TETANUS/TDAP  Never done   OPHTHALMOLOGY EXAM  09/17/2018   COLONOSCOPY (Pts 45-12yrs Insurance coverage will need to be confirmed)  04/18/2020   FOOT EXAM  03/14/2021   HEMOGLOBIN A1C  09/04/2021   URINE MICROALBUMIN  03/07/2022   Pneumonia Vaccine 56+ Years old  Completed   INFLUENZA VACCINE  Completed   Hepatitis C Screening  Completed   Zoster Vaccines- Shingrix  Completed   HPV VACCINES  Aged Out   COVID-19 Vaccine  Discontinued    Health Maintenance  Health Maintenance Due  Topic Date Due   TETANUS/TDAP  Never done   OPHTHALMOLOGY EXAM  09/17/2018   COLONOSCOPY (Pts 45-60yrs Insurance coverage will need to be confirmed)  04/18/2020   FOOT EXAM  03/14/2021    Colorectal cancer screening: Referral to GI placed 05/30/2021. Pt aware the office will call re: appt.  Lung Cancer Screening: (Low Dose CT Chest recommended if Age 52-80 years, 30 pack-year currently smoking OR have quit w/in 15years.) does not qualify.  Non smoker  Additional Screening:  Hepatitis C Screening: does qualify; Completed 10/06/2015  Vision Screening: Recommended annual ophthalmology exams for early detection of glaucoma and other disorders of the eye. Is the patient up to date with their annual eye exam?  No  Who is the provider or what is the name of the office in which the patient attends annual eye exams? Referral placed today. If pt is not established with a provider, would they like to be referred to a provider to establish care? Yes .   Dental Screening:  Recommended annual dental exams for proper oral hygiene  Community Resource Referral / Chronic Care Management: CRR required this visit?  No   CCM required this visit?  Yes      Plan:     I have personally reviewed and noted the following in the patients chart:   Medical and social history Use of alcohol, tobacco or illicit drugs  Current medications and supplements including opioid prescriptions. Patient is not currently taking opioid prescriptions. Functional ability and status Nutritional status Physical activity Advanced directives List of other physicians Hospitalizations, surgeries, and ER visits in previous 12 months Vitals Screenings to include cognitive, depression, and falls Referrals and appointments  In addition, I have reviewed and discussed with patient certain preventive protocols, quality metrics, and best practice recommendations. A written personalized care plan for preventive services as well as general preventive health recommendations were provided to patient.     Chriss Driver, LPN   [id number]   Nurse Notes: IN PERSON VISIT AT [facility]. Pt would like referrals placed for eye exam and also for colonoscopy with Dr. Sydell Axon. Order placed today. Up to date on vaccines. 6CIT score of 6. Pt c/o some increased stress and depression due to family issues but states he feels like he is handling it okay. Advised pt to call if situation changes. Pt verbalized understanding.

## 2021-06-01 ENCOUNTER — Encounter: Payer: Self-pay | Admitting: Internal Medicine

## 2021-06-06 ENCOUNTER — Encounter: Payer: Self-pay | Admitting: Family Medicine

## 2021-06-06 ENCOUNTER — Ambulatory Visit (INDEPENDENT_AMBULATORY_CARE_PROVIDER_SITE_OTHER): Payer: Medicare Other | Admitting: Family Medicine

## 2021-06-06 ENCOUNTER — Other Ambulatory Visit: Payer: Self-pay

## 2021-06-06 VITALS — BP 148/70 | Temp 98.6°F | Wt 217.0 lb

## 2021-06-06 DIAGNOSIS — E1169 Type 2 diabetes mellitus with other specified complication: Secondary | ICD-10-CM

## 2021-06-06 DIAGNOSIS — I10 Essential (primary) hypertension: Secondary | ICD-10-CM

## 2021-06-06 DIAGNOSIS — E785 Hyperlipidemia, unspecified: Secondary | ICD-10-CM | POA: Diagnosis not present

## 2021-06-06 DIAGNOSIS — E7849 Other hyperlipidemia: Secondary | ICD-10-CM

## 2021-06-06 MED ORDER — PANTOPRAZOLE SODIUM 40 MG PO TBEC: DELAYED_RELEASE_TABLET | ORAL | 12 refills | Status: DC

## 2021-06-06 MED ORDER — AMLODIPINE BESYLATE 5 MG PO TABS: ORAL_TABLET | ORAL | 12 refills | Status: DC

## 2021-06-06 MED ORDER — ALLOPURINOL 100 MG PO TABS: 100.0000 mg | ORAL_TABLET | Freq: Every day | ORAL | 11 refills | Status: DC

## 2021-06-06 NOTE — Progress Notes (Signed)
° °  Subjective:    Patient ID: Kerry Solis, male    DOB: 08/22/1945, 75 y.o.   MRN: [id number]  HPI Pt following up on blood pressure and A1C. Pt states he has been out of his Allopurinol and gout has been acting up.  Patient has been taking his medicine Try to watch his diet States he did run out of blood pressure medicine Denies any chest tightness pressure or pain but does relate a little bit of intermittent intermittent coughing.  Denies high fever chills is being treated currently for HIV and for MS Also he did go through a period of depression then he had family staying with him but he is doing better now and he states he enjoys his independence he states he has a cousin that helps him get to the grocery store he relates he fixes his own foods and he also states he tries to be healthy as best as possible Pt states blood pressure has been doing ok.   Has been dealing with cough, productive cough for about 2-3 weeks off and on.     Review of Systems     Objective:   Physical Exam  General-in no acute distress Eyes-no discharge Lungs-respiratory rate normal, CTA CV-no murmurs,RRR Extremities skin warm dry no edema Neuro grossly normal Behavior normal, alert       Assessment & Plan:  1. Essential hypertension Blood pressure he ran out of his medicine restart medicine follow-up within 6 weeks to recheck blood pressure - Hemoglobin A1c - Lipid Profile  2. Other hyperlipidemia Healthy diet continue current measures check lab work - Hemoglobin A1c - Lipid Profile  3. Hyperlipidemia associated with type 2 diabetes mellitus (HCC) Healthy diet minimize starches check lab work - Hemoglobin A1c - Lipid Profile

## 2021-06-27 ENCOUNTER — Other Ambulatory Visit: Payer: Self-pay | Admitting: Family Medicine

## 2021-07-04 DIAGNOSIS — Z79899 Other long term (current) drug therapy: Secondary | ICD-10-CM | POA: Diagnosis not present

## 2021-07-04 DIAGNOSIS — G35 Multiple sclerosis: Secondary | ICD-10-CM | POA: Diagnosis not present

## 2021-07-04 DIAGNOSIS — H401124 Primary open-angle glaucoma, left eye, indeterminate stage: Secondary | ICD-10-CM | POA: Diagnosis not present

## 2021-07-10 ENCOUNTER — Other Ambulatory Visit: Payer: Self-pay | Admitting: Urology

## 2021-07-10 DIAGNOSIS — R351 Nocturia: Secondary | ICD-10-CM

## 2021-07-14 DIAGNOSIS — E1169 Type 2 diabetes mellitus with other specified complication: Secondary | ICD-10-CM | POA: Diagnosis not present

## 2021-07-14 DIAGNOSIS — E785 Hyperlipidemia, unspecified: Secondary | ICD-10-CM | POA: Diagnosis not present

## 2021-07-14 DIAGNOSIS — I10 Essential (primary) hypertension: Secondary | ICD-10-CM | POA: Diagnosis not present

## 2021-07-14 DIAGNOSIS — E7849 Other hyperlipidemia: Secondary | ICD-10-CM | POA: Diagnosis not present

## 2021-07-15 LAB — HEMOGLOBIN A1C
Est. average glucose Bld gHb Est-mCnc: 194 mg/dL
Hgb A1c MFr Bld: 8.4 % — ABNORMAL HIGH (ref 4.8–5.6)

## 2021-07-15 LAB — LIPID PANEL
Chol/HDL Ratio: 4.6 ratio (ref 0.0–5.0)
Cholesterol, Total: 134 mg/dL (ref 100–199)
HDL: 29 mg/dL — ABNORMAL LOW (ref >39)
LDL Chol Calc (NIH): 75 mg/dL (ref 0–99)
Triglycerides: 173 mg/dL — ABNORMAL HIGH (ref 0–149)
VLDL Cholesterol Cal: 30 mg/dL (ref 5–40)

## 2021-07-18 ENCOUNTER — Ambulatory Visit (INDEPENDENT_AMBULATORY_CARE_PROVIDER_SITE_OTHER): Payer: Self-pay | Admitting: *Deleted

## 2021-07-18 ENCOUNTER — Other Ambulatory Visit: Payer: Self-pay

## 2021-07-18 ENCOUNTER — Ambulatory Visit (INDEPENDENT_AMBULATORY_CARE_PROVIDER_SITE_OTHER): Payer: Medicare Other | Admitting: Family Medicine

## 2021-07-18 VITALS — BP 138/78 | Temp 98.2°F | Wt 219.8 lb

## 2021-07-18 VITALS — Ht 69.0 in | Wt 200.0 lb

## 2021-07-18 DIAGNOSIS — G35 Multiple sclerosis: Secondary | ICD-10-CM | POA: Diagnosis not present

## 2021-07-18 DIAGNOSIS — M1000 Idiopathic gout, unspecified site: Secondary | ICD-10-CM | POA: Diagnosis not present

## 2021-07-18 DIAGNOSIS — E1169 Type 2 diabetes mellitus with other specified complication: Secondary | ICD-10-CM

## 2021-07-18 DIAGNOSIS — M2550 Pain in unspecified joint: Secondary | ICD-10-CM | POA: Diagnosis not present

## 2021-07-18 DIAGNOSIS — Z86010 Personal history of colonic polyps: Secondary | ICD-10-CM

## 2021-07-18 DIAGNOSIS — E785 Hyperlipidemia, unspecified: Secondary | ICD-10-CM | POA: Diagnosis not present

## 2021-07-18 MED ORDER — METFORMIN HCL 500 MG PO TABS: 500.0000 mg | ORAL_TABLET | Freq: Two times a day (BID) | ORAL | 6 refills | Status: DC

## 2021-07-18 NOTE — Progress Notes (Signed)
° °  Subjective:    Patient ID: Kerry Solis, male    DOB: 1945-07-30, 76 y.o.   MRN: [id number]  HPI Pt here for follow up. Pt states that his MS doctor advised him to have a brain scan. Pt states he has become more stiff in his joints. Pt states his blood pressure has been doing ok.   Patient with MS Being followed closely by specialist Multiple sclerosis ([place])  Idiopathic gout, unspecified chronicity, unspecified site - Plan: Hemoglobin A1c, Urine Microalbumin w/creat. ratio, Basic Metabolic Panel (BMET), Uric acid, Sed Rate (ESR)  Hyperlipidemia associated with type 2 diabetes mellitus ([place]) - Plan: Hemoglobin A1c, Urine Microalbumin w/creat. ratio, Basic Metabolic Panel (BMET), Uric acid, Sed Rate (ESR)  Arthralgia, unspecified joint - Plan: Hemoglobin A1c, Urine Microalbumin w/creat. ratio, Basic Metabolic Panel (BMET), Uric acid, Sed Rate (ESR)  Controlled type 2 diabetes mellitus with other specified complication, without long-term current use of insulin ([place]) - Plan: Hemoglobin A1c, Urine Microalbumin w/creat. ratio, Basic Metabolic Panel (BMET), Uric acid, Sed Rate (ESR)  Review of Systems     Objective:   Physical Exam  General-in no acute distress Eyes-no discharge Lungs-respiratory rate normal, CTA CV-no murmurs,RRR Extremities skin warm dry no edema Neuro grossly normal Behavior normal, alert       Assessment & Plan:  1. Multiple sclerosis ([place]) His MS doctor wants him to do MRI by July we will tackle this at his next follow-up in 3 months  2. Idiopathic gout, unspecified chronicity, unspecified site Continue gout medicine check lab work continue healthy diet - Hemoglobin A1c - Urine Microalbumin w/creat. ratio - Basic Metabolic Panel (BMET) - Uric acid - Sed Rate (ESR)  3. Hyperlipidemia associated with type 2 diabetes mellitus ([place]) Continue statin keep LDL below 70 keep A1c under good control add metformin twice daily recheck labs in 3 months -  Hemoglobin A1c - Urine Microalbumin w/creat. ratio - Basic Metabolic Panel (BMET) - Uric acid - Sed Rate (ESR)  4. Arthralgia, unspecified joint Complains of intermittent joint pains muscle pains check sed rate - Hemoglobin A1c - Urine Microalbumin w/creat. ratio - Basic Metabolic Panel (BMET) - Uric acid - Sed Rate (ESR)  5. Controlled type 2 diabetes mellitus with other specified complication, without long-term current use of insulin (HCC) Importance of cutting back portion control regular activity losing weight and starting metformin side effects discussed - Hemoglobin A1c - Urine Microalbumin w/creat. ratio - Basic Metabolic Panel (BMET) - Uric acid - Sed Rate (ESR)

## 2021-07-18 NOTE — Progress Notes (Signed)
Gastroenterology Pre-Procedure Review  Request Date: 07/18/2021 Requesting Physician: Dr. Sallee Lange, Last TCS done 04/19/2015 by Dr. Oneida Alar, sessile serrated polyp, 5-10 year recall  PATIENT REVIEW QUESTIONS: The patient responded to the following health history questions as indicated:    1. Diabetes Melitis: no 2. Joint replacements in the past 12 months: no 3. Major health problems in the past 3 months: no 4. Has an artificial valve or MVP: no 5. Has a defibrillator: no 6. Has been advised in past to take antibiotics in advance of a procedure like teeth cleaning: no 7. Family history of colon cancer: no  8. Alcohol Use: no 9. Illicit drug Use: no 10. History of sleep apnea: yes, but had it years ago, fine now 11. History of coronary artery or other vascular stents placed within the last 12 months: no 12. History of any prior anesthesia complications: no 13. Body mass index is 29.53 kg/m.    MEDICATIONS & ALLERGIES:    Patient reports the following regarding taking any blood thinners:   Plavix? no Aspirin? no Coumadin? no Brilinta? no Xarelto? no Eliquis? no Pradaxa? no Savaysa? no Effient? no  Patient confirms/reports the following medications:  Current Outpatient Medications  Medication Sig Dispense Refill   acetaminophen (TYLENOL) 500 MG tablet Take 500 mg by mouth as needed.     alfuzosin (UROXATRAL) 10 MG 24 hr tablet TAKE ONE TABLET BY MOUTH AT BEDTIME. 28 tablet 0   allopurinol (ZYLOPRIM) 100 MG tablet Take 1 tablet (100 mg total) by mouth daily. 28 tablet 11   amitriptyline (ELAVIL) 25 MG tablet Take by mouth daily at 6 (six) AM.     amLODipine (NORVASC) 5 MG tablet TAKE 1 TABLET(5 MG) BY MOUTH DAILY 30 tablet 12   DESCOVY 200-25 MG tablet Take 1 tablet by mouth daily.     gabapentin (NEURONTIN) 100 MG capsule Take 100 mg by mouth 3 (three) times daily.     ondansetron (ZOFRAN-ODT) 4 MG disintegrating tablet Take 1 tablet (4 mg total) by mouth every 8 (eight)  hours as needed for nausea or vomiting. (Patient taking differently: Take 4 mg by mouth as needed for nausea or vomiting.) 8 tablet 0   pantoprazole (PROTONIX) 40 MG tablet TAKE 1 TABLET(40 MG) BY MOUTH DAILY (Patient taking differently: as needed. TAKE 1 TABLET(40 MG) BY MOUTH DAILY) 30 tablet 12   TIVICAY 50 MG tablet Take 50 mg by mouth daily.     No current facility-administered medications for this visit.    Patient confirms/reports the following allergies:  Allergies  Allergen Reactions   Pravastatin     myalgias, elevated CK   Yellow Jacket Venom Swelling    No orders of the defined types were placed in this encounter.   AUTHORIZATION INFORMATION Primary Insurance: [employer] Medicare,  ID #: [id number],  Group #: [id number] Pre-Cert / Josem Kaufmann required:  Pre-Cert / Auth #:   SCHEDULE INFORMATION: Procedure has been scheduled as follows:  Date: , Time:   Location: [facility] with Dr. Abbey Chatters  This Gastroenterology Pre-Precedure Review Form is being routed to the following provider(s): Aliene Altes, PA-C

## 2021-07-19 NOTE — Progress Notes (Signed)
ASA III. Need OV to schedule.

## 2021-07-21 NOTE — Progress Notes (Signed)
Spoke to pt.  Informed him that he needs ov to arrange colonoscopy.  Scheduled ov for 08/29/2021 at 10:00 with Roseanne Kaufman, NP.

## 2021-08-14 DIAGNOSIS — H524 Presbyopia: Secondary | ICD-10-CM | POA: Diagnosis not present

## 2021-08-18 DIAGNOSIS — E1169 Type 2 diabetes mellitus with other specified complication: Secondary | ICD-10-CM | POA: Diagnosis not present

## 2021-08-18 DIAGNOSIS — M2550 Pain in unspecified joint: Secondary | ICD-10-CM | POA: Diagnosis not present

## 2021-08-18 DIAGNOSIS — M1000 Idiopathic gout, unspecified site: Secondary | ICD-10-CM | POA: Diagnosis not present

## 2021-08-18 DIAGNOSIS — E785 Hyperlipidemia, unspecified: Secondary | ICD-10-CM | POA: Diagnosis not present

## 2021-08-19 LAB — BASIC METABOLIC PANEL
BUN/Creatinine Ratio: 10 (ref 10–24)
BUN: 13 mg/dL (ref 8–27)
CO2: 22 mmol/L (ref 20–29)
Calcium: 9.7 mg/dL (ref 8.6–10.2)
Chloride: 106 mmol/L (ref 96–106)
Creatinine, Ser: 1.34 mg/dL — ABNORMAL HIGH (ref 0.76–1.27)
Glucose: 120 mg/dL — ABNORMAL HIGH (ref 70–99)
Potassium: 4.5 mmol/L (ref 3.5–5.2)
Sodium: 143 mmol/L (ref 134–144)
eGFR: 55 mL/min/{1.73_m2} — ABNORMAL LOW (ref >59)

## 2021-08-19 LAB — HEMOGLOBIN A1C
Est. average glucose Bld gHb Est-mCnc: 177 mg/dL
Hgb A1c MFr Bld: 7.8 % — ABNORMAL HIGH (ref 4.8–5.6)

## 2021-08-19 LAB — MICROALBUMIN / CREATININE URINE RATIO
Creatinine, Urine: 145.1 mg/dL
Microalb/Creat Ratio: 33 mg/g creat — ABNORMAL HIGH (ref 0–29)
Microalbumin, Urine: 48.5 ug/mL

## 2021-08-19 LAB — URIC ACID: Uric Acid: 7 mg/dL (ref 3.8–8.4)

## 2021-08-19 LAB — SEDIMENTATION RATE: Sed Rate: 4 mm/hr (ref 0–30)

## 2021-08-20 ENCOUNTER — Encounter: Payer: Self-pay | Admitting: Family Medicine

## 2021-08-28 NOTE — Progress Notes (Deleted)
history of elevated transaminases in the past, likely due to fatty liver. History of Hep B exposure in thte past, now on Ocrevus starting in June of 2019. History of sessile serrated adenoma in 2016 with surveillance overdue.  ?

## 2021-08-29 ENCOUNTER — Encounter: Payer: Self-pay | Admitting: Internal Medicine

## 2021-08-29 ENCOUNTER — Ambulatory Visit: Payer: Medicare Other | Admitting: Gastroenterology

## 2021-09-04 ENCOUNTER — Other Ambulatory Visit: Payer: Self-pay

## 2021-09-04 ENCOUNTER — Other Ambulatory Visit: Payer: Medicare Other

## 2021-09-04 ENCOUNTER — Encounter (HOSPITAL_COMMUNITY): Payer: Self-pay

## 2021-09-04 ENCOUNTER — Emergency Department (HOSPITAL_COMMUNITY)
Admission: EM | Admit: 2021-09-04 | Discharge: 2021-09-04 | Disposition: A | Discharge status: Home / Self Care | Payer: Medicare Other | Attending: Emergency Medicine | Admitting: Emergency Medicine

## 2021-09-04 DIAGNOSIS — T162XXA Foreign body in left ear, initial encounter: Secondary | ICD-10-CM | POA: Insufficient documentation

## 2021-09-04 DIAGNOSIS — H61892 Other specified disorders of left external ear: Secondary | ICD-10-CM

## 2021-09-04 DIAGNOSIS — X58XXXA Exposure to other specified factors, initial encounter: Secondary | ICD-10-CM | POA: Insufficient documentation

## 2021-09-04 NOTE — ED Triage Notes (Signed)
Pt arrived from home with complaints of foreign body in ear. States that he was awaken from sleep tonight and felt something inside of left ear and when trying to remove it, it slid down further. Says ear canal also feels tighter than normal. ?

## 2021-09-04 NOTE — ED Provider Notes (Signed)
? ?[facility]  ?Provider Note ? ?CSN: [id number] ?Arrival date & time: 09/04/21 0354 ? ?History ?Chief Complaint  ?Patient presents with  ? Foreign Body in [place]  ? ? ?HOLDEN MANISCALCO is a 76 y.o. male reports he woke up during the night with a foreign body sensation in his L ear. It was itching and he tried to stick his finger in there to scratch it but felt like he had pushed the FB further in. No changes in hearing or drainage. Otherwise he is doing well.  ? ? ?Home Medications ?Prior to Admission medications   ?Medication Sig Start Date End Date Taking? Authorizing Provider  ?acetaminophen (TYLENOL) 500 MG tablet Take 500 mg by mouth as needed.    [provider]  ?alfuzosin (UROXATRAL) 10 MG 24 hr tablet TAKE ONE TABLET BY MOUTH AT BEDTIME. 07/11/21   McKenzie, Candee Furbish, MD  ?allopurinol (ZYLOPRIM) 100 MG tablet Take 1 tablet (100 mg total) by mouth daily. 06/06/21   Kathyrn Drown, MD  ?amitriptyline (ELAVIL) 25 MG tablet Take by mouth daily at 6 (six) AM. 12/30/20   [provider]  ?amLODipine (NORVASC) 5 MG tablet TAKE 1 TABLET(5 MG) BY MOUTH DAILY 06/06/21   Kathyrn Drown, MD  ?DESCOVY 200-25 MG tablet Take 1 tablet by mouth daily. 03/06/21   [provider]  ?gabapentin (NEURONTIN) 100 MG capsule Take 100 mg by mouth 3 (three) times daily. 06/29/20   [provider]  ?metFORMIN (GLUCOPHAGE) 500 MG tablet Take 1 tablet (500 mg total) by mouth 2 (two) times daily with a meal. 07/18/21   Luking, Elayne Snare, MD  ?ondansetron (ZOFRAN-ODT) 4 MG disintegrating tablet Take 1 tablet (4 mg total) by mouth every 8 (eight) hours as needed for nausea or vomiting. ?Patient taking differently: Take 4 mg by mouth as needed for nausea or vomiting. 10/13/20   Davonna Belling, MD  ?pantoprazole (PROTONIX) 40 MG tablet TAKE 1 TABLET(40 MG) BY MOUTH DAILY ?Patient taking differently: as needed. TAKE 1 TABLET(40 MG) BY MOUTH DAILY 06/06/21   Kathyrn Drown, MD  ?TIVICAY 50 MG  tablet Take 50 mg by mouth daily. 03/06/21   [provider]  ? ? ? ?Allergies    ?Pravastatin and Yellow jacket venom ? ? ?Review of Systems   ?Review of Systems ?Please see HPI for pertinent positives and negatives ? ?Physical Exam ?BP (!) 143/108   Pulse 87   Temp 98.1 ?F (36.7 ?C) (Oral)   Resp 17   Ht '5\' 8"'$  (1.727 m)   Wt 95.3 kg   SpO2 97%   BMI 31.93 kg/m?  ? ?Physical Exam ?Vitals and nursing note reviewed.  ?HENT:  ?   Head: Normocephalic.  ?   Right Ear: Tympanic membrane and ear canal normal.  ?   Left Ear: Tympanic membrane and ear canal normal.  ?   Ears:  ?   Comments: No FB ?   Nose: Nose normal.  ?Eyes:  ?   Extraocular Movements: Extraocular movements intact.  ?Pulmonary:  ?   Effort: Pulmonary effort is normal.  ?Musculoskeletal:     ?   General: Normal range of motion.  ?   Cervical back: Neck supple.  ?Skin: ?   Findings: No rash (on exposed skin).  ?Neurological:  ?   Mental Status: He is alert and oriented to person, place, and time.  ?Psychiatric:     ?   Mood and Affect: Mood normal.  ? ? ?  ED Results / Procedures / Treatments   ?EKG ?None ? ?Procedures ?Procedures ? ?Medications Ordered in the ED ?Medications - No data to display ? ?Initial Impression and Plan ? No FB or other abnormality to explain the patient's FB sensation. Recommend he avoid putting anything in his ear and follow up with PCP.  ? ?ED Course  ? ?  ? ? ?MDM Rules/Calculators/A&P ?Medical Decision Making ?Problems Addressed: ?Foreign body sensation in ear canal, left: acute illness or injury ? ? ? ?Final Clinical Impression(s) / ED Diagnoses ?Final diagnoses:  ?Foreign body sensation in ear canal, left  ? ? ?Rx / DC Orders ?ED Discharge Orders   ? ? None  ? ?  ? ?  ?Truddie Hidden, MD ?09/04/21 [phone] ? ?

## 2021-09-06 DIAGNOSIS — L89523 Pressure ulcer of left ankle, stage 3: Secondary | ICD-10-CM | POA: Diagnosis not present

## 2021-09-11 ENCOUNTER — Ambulatory Visit: Payer: Medicare Other | Admitting: Urology

## 2021-09-11 DIAGNOSIS — N401 Enlarged prostate with lower urinary tract symptoms: Secondary | ICD-10-CM

## 2021-09-11 DIAGNOSIS — C61 Malignant neoplasm of prostate: Secondary | ICD-10-CM

## 2021-09-20 DIAGNOSIS — Z79899 Other long term (current) drug therapy: Secondary | ICD-10-CM | POA: Diagnosis not present

## 2021-09-20 DIAGNOSIS — K7581 Nonalcoholic steatohepatitis (NASH): Secondary | ICD-10-CM | POA: Diagnosis not present

## 2021-09-20 DIAGNOSIS — Z8616 Personal history of COVID-19: Secondary | ICD-10-CM | POA: Diagnosis not present

## 2021-09-20 DIAGNOSIS — G35 Multiple sclerosis: Secondary | ICD-10-CM | POA: Diagnosis not present

## 2021-09-20 DIAGNOSIS — I10 Essential (primary) hypertension: Secondary | ICD-10-CM | POA: Diagnosis not present

## 2021-10-17 ENCOUNTER — Ambulatory Visit (INDEPENDENT_AMBULATORY_CARE_PROVIDER_SITE_OTHER): Payer: Medicare Other | Admitting: Family Medicine

## 2021-10-17 VITALS — BP 138/86 | HR 85 | Temp 98.1°F | Ht 68.0 in | Wt 215.0 lb

## 2021-10-17 DIAGNOSIS — E1169 Type 2 diabetes mellitus with other specified complication: Secondary | ICD-10-CM

## 2021-10-17 DIAGNOSIS — M791 Myalgia, unspecified site: Secondary | ICD-10-CM

## 2021-10-17 DIAGNOSIS — Z8546 Personal history of malignant neoplasm of prostate: Secondary | ICD-10-CM | POA: Diagnosis not present

## 2021-10-17 DIAGNOSIS — C61 Malignant neoplasm of prostate: Secondary | ICD-10-CM

## 2021-10-17 DIAGNOSIS — Z79899 Other long term (current) drug therapy: Secondary | ICD-10-CM

## 2021-10-17 DIAGNOSIS — T466X5A Adverse effect of antihyperlipidemic and antiarteriosclerotic drugs, initial encounter: Secondary | ICD-10-CM | POA: Diagnosis not present

## 2021-10-17 DIAGNOSIS — B20 Human immunodeficiency virus [HIV] disease: Secondary | ICD-10-CM

## 2021-10-17 DIAGNOSIS — I10 Essential (primary) hypertension: Secondary | ICD-10-CM

## 2021-10-17 NOTE — Progress Notes (Signed)
? ?  Subjective:  ? ? Patient ID: Kerry Solis, male    DOB: [birth year], 76 y.o.   MRN: [id number] ? ?HPI ? ?Patient here for 3 month follow up. ?Patient states his moods are doing better ?He enjoys getting up and doing things around the house and outside ?He states he drives locally and does well with this ?Does not do nighttime driving ?States he does shopping with his niece and fixes his own food most of the time ?He feels his overall health conditions are doing well ?Denies being depressed ?States HIV under good control with specialist ?MS under good control with specialist but they are planning on doing a follow-up MRI in July ? ? ?Review of Systems ? ?   ?Objective:  ? Physical Exam ?General-in no acute distress ?Eyes-no discharge ?Lungs-respiratory rate normal, CTA ?CV-no murmurs,RRR ?Extremities skin warm dry no edema ?Neuro grossly normal ?Behavior normal, alert ? ? ? ? ?   ?Assessment & Plan:  ?1. Essential hypertension ?Bp slightly elev here but when stands drops 20 points therefore do not change meds ?Healthy diet ?- Basic Metabolic Panel (BMET) ? ?2. Controlled type 2 diabetes mellitus with other specified complication, without long-term current use of insulin ([place]) ?Check A1C, continue meds await GFR ?- Hemoglobin A1C ? ?3. Myalgia due to statin ?Can not take statins ? ?4. History of prostate cancer ?Check PSA, was tx with radiation ?- PSA ? ?5. High risk medication use ?none ?- Basic Metabolic Panel (BMET) ? ?6. Currently asymptomatic HIV infection, with history of HIV-related illness ([place]) ?Under care of  ? ?7. Prostate cancer ([place]) ?Check psa await result hopefully still in remission ? ?Mid Sept ?

## 2021-10-17 NOTE — Patient Instructions (Signed)
Please do your blood work in [place] then follow up mid sept ?

## 2021-11-17 ENCOUNTER — Other Ambulatory Visit: Payer: Self-pay | Admitting: Urology

## 2021-11-17 DIAGNOSIS — R351 Nocturia: Secondary | ICD-10-CM

## 2021-11-28 DIAGNOSIS — Z0389 Encounter for observation for other suspected diseases and conditions ruled out: Secondary | ICD-10-CM | POA: Diagnosis not present

## 2021-11-28 DIAGNOSIS — G35 Multiple sclerosis: Secondary | ICD-10-CM | POA: Diagnosis not present

## 2021-12-02 DIAGNOSIS — G35 Multiple sclerosis: Secondary | ICD-10-CM | POA: Diagnosis not present

## 2021-12-08 ENCOUNTER — Ambulatory Visit (INDEPENDENT_AMBULATORY_CARE_PROVIDER_SITE_OTHER): Payer: Medicare Other | Admitting: Family Medicine

## 2021-12-08 VITALS — BP 140/78 | HR 90 | Temp 97.7°F | Ht 68.0 in | Wt 207.2 lb

## 2021-12-08 DIAGNOSIS — F4321 Adjustment disorder with depressed mood: Secondary | ICD-10-CM

## 2021-12-14 ENCOUNTER — Telehealth: Payer: Self-pay

## 2021-12-14 MED ORDER — SILODOSIN 8 MG PO CAPS: 8.0000 mg | ORAL_CAPSULE | Freq: Every day | ORAL | 3 refills | Status: DC

## 2021-12-14 NOTE — Telephone Encounter (Signed)
Received call from [facility]  Alfuzosin on national backorder with no release date.   Discussed with Dr. Alyson Ingles- new order for silodosin '8mg'$  daily will be sent to pharmacy in placed on backorder medication

## 2021-12-27 ENCOUNTER — Telehealth: Payer: Self-pay

## 2021-12-27 NOTE — Telephone Encounter (Signed)
So patient has blood work that was ordered in May that is still active that includes A1c as well as PSA he needs to go ahead and request the blood work that was ordered in May at [facility] and get it completed thank you

## 2021-12-27 NOTE — Telephone Encounter (Signed)
Caller name:Wallis Cherubin   On DPR? :No  Call back number:[phone]  Provider they see: Luk[place]   Reason for call:Pt wants to see if Dr Nicki Reaper will order his PSA again not had in over a year he missed last two ordered

## 2021-12-28 DIAGNOSIS — H5462 Unqualified visual loss, left eye, normal vision right eye: Secondary | ICD-10-CM | POA: Diagnosis not present

## 2021-12-28 DIAGNOSIS — G35 Multiple sclerosis: Secondary | ICD-10-CM | POA: Diagnosis not present

## 2021-12-28 NOTE — Telephone Encounter (Signed)
Pt contacted and verbalized understanding.  

## 2021-12-29 ENCOUNTER — Ambulatory Visit: Payer: Medicare Other | Admitting: Urology

## 2022-01-01 DIAGNOSIS — Z79899 Other long term (current) drug therapy: Secondary | ICD-10-CM | POA: Diagnosis not present

## 2022-01-01 DIAGNOSIS — I10 Essential (primary) hypertension: Secondary | ICD-10-CM | POA: Diagnosis not present

## 2022-01-01 DIAGNOSIS — E1169 Type 2 diabetes mellitus with other specified complication: Secondary | ICD-10-CM | POA: Diagnosis not present

## 2022-01-02 ENCOUNTER — Encounter: Payer: Self-pay | Admitting: Family Medicine

## 2022-01-02 LAB — HEMOGLOBIN A1C
Est. average glucose Bld gHb Est-mCnc: 140 mg/dL
Hgb A1c MFr Bld: 6.5 % — ABNORMAL HIGH (ref 4.8–5.6)

## 2022-01-02 LAB — BASIC METABOLIC PANEL
BUN/Creatinine Ratio: 11 (ref 10–24)
BUN: 14 mg/dL (ref 8–27)
CO2: 22 mmol/L (ref 20–29)
Calcium: 9.9 mg/dL (ref 8.6–10.2)
Chloride: 107 mmol/L — ABNORMAL HIGH (ref 96–106)
Creatinine, Ser: 1.22 mg/dL (ref 0.76–1.27)
Glucose: 133 mg/dL — ABNORMAL HIGH (ref 70–99)
Potassium: 4.6 mmol/L (ref 3.5–5.2)
Sodium: 143 mmol/L (ref 134–144)
eGFR: 61 mL/min/{1.73_m2} (ref >59)

## 2022-01-02 LAB — PSA: Prostate Specific Ag, Serum: <0.1 ng/mL (ref 0.0–4.0)

## 2022-01-12 ENCOUNTER — Other Ambulatory Visit: Payer: Self-pay | Admitting: Family Medicine

## 2022-01-17 ENCOUNTER — Ambulatory Visit (INDEPENDENT_AMBULATORY_CARE_PROVIDER_SITE_OTHER): Payer: Medicare Other | Admitting: Urology

## 2022-01-17 ENCOUNTER — Encounter: Payer: Self-pay | Admitting: Urology

## 2022-01-17 VITALS — BP 161/93 | HR 71

## 2022-01-17 DIAGNOSIS — C61 Malignant neoplasm of prostate: Secondary | ICD-10-CM

## 2022-01-17 DIAGNOSIS — R351 Nocturia: Secondary | ICD-10-CM

## 2022-01-17 DIAGNOSIS — N138 Other obstructive and reflux uropathy: Secondary | ICD-10-CM | POA: Diagnosis not present

## 2022-01-17 DIAGNOSIS — N401 Enlarged prostate with lower urinary tract symptoms: Secondary | ICD-10-CM

## 2022-01-17 DIAGNOSIS — N5201 Erectile dysfunction due to arterial insufficiency: Secondary | ICD-10-CM | POA: Diagnosis not present

## 2022-01-17 DIAGNOSIS — Z8546 Personal history of malignant neoplasm of prostate: Secondary | ICD-10-CM

## 2022-01-17 LAB — URINALYSIS, ROUTINE W REFLEX MICROSCOPIC
Bilirubin, UA: NEGATIVE
Glucose, UA: NEGATIVE
Ketones, UA: NEGATIVE
Leukocytes,UA: NEGATIVE
Nitrite, UA: NEGATIVE
Protein,UA: NEGATIVE
Specific Gravity, UA: 1.015 (ref 1.005–1.030)
Urobilinogen, Ur: 0.2 mg/dL (ref 0.2–1.0)
pH, UA: 5 (ref 5.0–7.5)

## 2022-01-17 LAB — MICROSCOPIC EXAMINATION
Bacteria, UA: NONE SEEN
Epithelial Cells (non renal): NONE SEEN /hpf (ref 0–10)
RBC, Urine: NONE SEEN /hpf (ref 0–2)
Renal Epithel, UA: NONE SEEN /hpf
WBC, UA: NONE SEEN /hpf (ref 0–5)

## 2022-01-17 MED ORDER — SILODOSIN 8 MG PO CAPS: 8.0000 mg | ORAL_CAPSULE | Freq: Every day | ORAL | 11 refills | Status: DC

## 2022-01-17 MED ORDER — SILDENAFIL CITRATE 100 MG PO TABS: 100.0000 mg | ORAL_TABLET | Freq: Every day | ORAL | 0 refills | Status: DC | PRN

## 2022-01-17 NOTE — Progress Notes (Signed)
01/17/2022 3:13 PM   Kerry Solis [birth year] [id number]  Referring provider: Kathyrn Drown, MD [place],  [place] [address]  Followup prostate cancer and BPH   HPI: Kerry Solis is a 76yo here followup for prostate cancer and BPH. IPSS 3 QOl 1 on rapalfo '8mg'$  daily. Nocturia 1x. Urine stream strong. PSA undetectable. He has issues getting and maintaining an erection. He can walk up a flight of stairs without issues.    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   Depression    Elevated liver enzymes    Glaucoma    High triglycerides    History of gout    HIV (human immunodeficiency virus infection) ([place])    Hypertension    Leukopenia    Low CD4   Multiple sclerosis ([place])    uses a cane   Myocardial infarction ([place])    45 years ago   Prostate cancer [facility])     Surgical History: Past Surgical History:  Procedure Laterality Date   BIOPSY  08/14/2016   Procedure: BIOPSY;  Surgeon: Danie Binder, MD;  [facility];  Service: Endoscopy;;  gastric bx's   CHOLECYSTECTOMY  06/10/2011   Procedure: LAPAROSCOPIC CHOLECYSTECTOMY;  Surgeon: Donato Heinz, MD;  Location: [facility] ORS;  Service: General;  Laterality: N/A;   COLONOSCOPY  03/10/2009   [id number] internal hemorrhoids/6-mm sessile ascending colon polyp/tortuous colon, diverticulosis. TA   COLONOSCOPY WITH PROPOFOL N/A 04/19/2015   Dr. Oneida Alar: sessile serrated adenoma, surveillance in 5 years    ESOPHAGOGASTRODUODENOSCOPY  06/23/2008   [id number] gastritis/schatzki ring   ESOPHAGOGASTRODUODENOSCOPY (EGD) WITH PROPOFOL N/A 08/14/2016   Dr. Oneida Alar: moderate Schatzi's ring s/p dilation, LA Grade A esophagitis,small hiatal hernia, chronic gastritis (negative H.pylori), one duodenal diverticulum   LAPAROSCOPIC APPENDECTOMY N/A 12/13/2017   Procedure: APPENDECTOMY LAPAROSCOPIC;  Surgeon: Aviva Signs, MD;   Location: [facility];  Service: General;  Laterality: N/A;   NECK SURGERY  unsure   disc   POLYPECTOMY N/A 04/19/2015   Procedure: POLYPECTOMY;  Surgeon: Danie Binder, MD;  Location: [facility];  Service: Endoscopy;  Laterality: N/A;  ascending colon   PROSTATE BIOPSY     RADIOACTIVE SEED IMPLANT N/A 11/29/2016   Procedure: RADIOACTIVE SEED IMPLANT/BRACHYTHERAPY IMPLANT;  Surgeon: Franchot Gallo, MD;  [facility];  Service: Urology;  Laterality: N/A;  81 seeds implanted   SAVORY DILATION N/A 08/14/2016   Procedure: SAVORY DILATION;  Surgeon: Danie Binder, MD;  Location[facility];  Service: Endoscopy;  Laterality: N/A;    Home Medications:  Allergies as of 01/17/2022       Reactions   Pravastatin    myalgias, elevated CK   Yellow Jacket Venom Swelling        Medication List        Accurate as of January 17, 2022  3:13 PM. If you have any questions, ask your nurse or doctor.          acetaminophen 500 MG tablet Commonly known as: TYLENOL Take 500 mg by mouth as needed.   allopurinol 100 MG tablet Commonly known as: ZYLOPRIM Take 1 tablet (100 mg total) by mouth daily.   amitriptyline 25 MG tablet Commonly known as: ELAVIL Take by mouth daily at 6 (six) AM.   amLODipine 5 MG tablet Commonly known as: NORVASC TAKE 1 TABLET(5  MG) BY MOUTH DAILY   Descovy 200-25 MG tablet Generic drug: emtricitabine-tenofovir AF Take 1 tablet by mouth daily.   gabapentin 100 MG capsule Commonly known as: NEURONTIN Take 100 mg by mouth 3 (three) times daily.   metFORMIN 500 MG tablet Commonly known as: GLUCOPHAGE TAKE (1) TABLET TWICE A DAY WITH FOOD---BREAKFAST AND SUPPER.   ondansetron 4 MG disintegrating tablet Commonly known as: ZOFRAN-ODT Take 1 tablet (4 mg total) by mouth every 8 (eight) hours as needed for nausea or vomiting.   pantoprazole 40 MG tablet Commonly known as: PROTONIX TAKE 1 TABLET(40 MG) BY MOUTH DAILY What changed:  when to  take this reasons to take this   silodosin 8 MG Caps capsule Commonly known as: RAPAFLO Take 1 capsule (8 mg total) by mouth daily with breakfast.   Tivicay 50 MG tablet Generic drug: dolutegravir Take 50 mg by mouth daily.        Allergies:  Allergies  Allergen Reactions   Pravastatin     myalgias, elevated CK   Yellow Jacket Venom Swelling    Family History: Family History  Problem Relation Age of Onset   Hypertension Mother    Heart attack Mother    Heart attack Father    Cancer Sister        Breast   Diabetes Sister    Diabetes Brother    Cancer Brother        unknown   Cancer Brother        colon   Cancer Sister        breast   Cancer Brother        prostate   Cancer Brother        colon    Social History:  reports that he has never smoked. He has never used smokeless tobacco. He reports that he does not drink alcohol and does not use drugs.  ROS: All other review of systems were reviewed and are negative except what is noted above in HPI  Physical Exam: BP (!) 161/93   Pulse 71   Constitutional:  Alert and oriented, No acute distress. HEENT: [place] AT, moist mucus membranes.  Trachea midline, no masses. Cardiovascular: No clubbing, cyanosis, or edema. Respiratory: Normal respiratory effort, no increased work of breathing. GI: Abdomen is soft, nontender, nondistended, no abdominal masses GU: No CVA tenderness.  Lymph: No cervical or inguinal lymphadenopathy. Skin: No rashes, bruises or suspicious lesions. Neurologic: Grossly intact, no focal deficits, moving all 4 extremities. Psychiatric: Normal mood and affect.  Laboratory Data: Lab Results  Component Value Date   WBC 10.5 10/13/2020   HGB 14.0 10/13/2020   HCT 46.3 10/13/2020   MCV 92.6 10/13/2020   PLT 266 10/13/2020    Lab Results  Component Value Date   CREATININE 1.22 01/01/2022    Lab Results  Component Value Date   PSA 2.39 04/30/2014   PSA 1.89 04/14/2013    No results  found for: "TESTOSTERONE"  Lab Results  Component Value Date   HGBA1C 6.5 (H) 01/01/2022    Urinalysis    Component Value Date/Time   COLORURINE STRAW (A) 10/13/2020 0914   APPEARANCEUR Clear 03/13/2021 1157   LABSPEC 1.011 10/13/2020 0914   PHURINE 7.0 10/13/2020 0914   GLUCOSEU Negative 03/13/2021 1157   HGBUR NEGATIVE 10/13/2020 0914   BILIRUBINUR Negative 03/13/2021 1157   KETONESUR NEGATIVE 10/13/2020 0914   PROTEINUR Negative 03/13/2021 1157   PROTEINUR 30 (A) 10/13/2020 0914   UROBILINOGEN 0.2 10/28/2012 1444  NITRITE Negative 03/13/2021 1157   NITRITE NEGATIVE 10/13/2020 0914   LEUKOCYTESUR Negative 03/13/2021 1157   LEUKOCYTESUR SMALL (A) 10/13/2020 0914    Lab Results  Component Value Date   LABMICR 48.5 08/18/2021   WBCUA >30 (A) 12/27/2020   LABEPIT None seen 12/27/2020   MUCUS Present 12/27/2020   BACTERIA Few 12/27/2020    Pertinent Imaging:  No results found for this or any previous visit.  No results found for this or any previous visit.  No results found for this or any previous visit.  No results found for this or any previous visit.  No results found for this or any previous visit.  No results found for this or any previous visit.  No results found for this or any previous visit.  No results found for this or any previous visit.   Assessment & Plan:    1. Prostate cancer ([place]) -RTC 1 year with PSA - Urinalysis, Routine w reflex microscopic  2. Benign prostatic hyperplasia with urinary obstruction -continue rapaflo '8mg'$  daily  3. Nocturia -Continue rapaflo '8mg'$  qhs  4. Erectile dysfunction -sildenafil '100mg'$  prn   No follow-ups on file.  Nicolette Bang, MD  [facility] [place]

## 2022-01-17 NOTE — Patient Instructions (Signed)

## 2022-02-06 ENCOUNTER — Ambulatory Visit (INDEPENDENT_AMBULATORY_CARE_PROVIDER_SITE_OTHER): Payer: Medicare Other | Admitting: Family Medicine

## 2022-02-06 VITALS — BP 139/86 | Ht 68.0 in | Wt 195.4 lb

## 2022-02-06 DIAGNOSIS — R197 Diarrhea, unspecified: Secondary | ICD-10-CM

## 2022-02-06 NOTE — Progress Notes (Signed)
   Subjective:    Patient ID: Kerry Solis, male    DOB: Jan 09, 1946, 76 y.o.   MRN: [id number]  HPI Patient arrives with problems with diarrhea for 2 or more months. Patient states when he eats it just seems to goo straight through him. Significant loose stools No mucus no blood No high fever chills Patient does have HIV background that could affect diarrhea but he believes it is related to medications but he does not know the medicines he is taking currently  Review of Systems     Objective:   Physical Exam  Lungs clear heart regular pulse normal abdomen soft      Assessment & Plan:  Diarrhea He will call us with his list of medicines It is possible we need to reduce the metformin or reduce one of the other medicines to see if that helps his diarrhea Hold off on stool testing currently

## 2022-02-09 ENCOUNTER — Ambulatory Visit (INDEPENDENT_AMBULATORY_CARE_PROVIDER_SITE_OTHER): Payer: Medicare Other | Admitting: Family Medicine

## 2022-02-09 VITALS — BP 113/56 | HR 78 | Temp 97.5°F | Ht 68.0 in | Wt 195.0 lb

## 2022-02-09 DIAGNOSIS — R519 Headache, unspecified: Secondary | ICD-10-CM

## 2022-02-09 NOTE — Patient Instructions (Signed)
Lab today.  Tylenol as needed.  Follow up in the next 1-2 weeks if this persists.  Take care  Dr. Lacinda Axon

## 2022-02-10 LAB — SEDIMENTATION RATE: Sed Rate: 3 mm/h (ref 0–30)

## 2022-02-12 DIAGNOSIS — R519 Headache, unspecified: Secondary | ICD-10-CM | POA: Insufficient documentation

## 2022-02-12 NOTE — Assessment & Plan Note (Signed)
Well appearing on exam. ESR today to evaluate for possibility of Temporal Arteritis.

## 2022-02-12 NOTE — Progress Notes (Signed)
Subjective:  Patient ID: Kerry Solis, male    DOB: 18-May-1946  Age: 76 y.o. MRN: [id number]  CC: Chief Complaint  Patient presents with   Headache    Sinus headache around the eyes and little runny nose for few days    HPI:  76 year old male with an extensive PMH presents for evaluation of the above.   Symptoms for a "couple" of weeks. Intermittent, brief R temporal headache. Last 15-20 mins then resolves spontaneously. Has had runny nose as well. No fever.  This was concerning given that it has continued to occur. No other complaints at this time.  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   Leukopenia    Glaucoma    Anxiety with depression    Memory deficits 01/14/2020   Personal history of pneumonia (recurrent) 06/19/2019   Prostate cancer ([place]) 06/27/2016   Multiple sclerosis ([place])    Diabetes type 2, controlled ([place]) 10/05/2015   Gout 10/05/2015   Fatty liver 06/24/2015   Hx of adenomatous colonic polyps 03/31/2015   Primary open angle glaucoma of left eye, indeterminate stage 07/17/2014   Prediabetes 12/04/2013   Hyperlipemia 03/11/2013   HIV (human immunodeficiency virus infection) ([place]) 02/14/2009   Essential hypertension 02/14/2009   Elevated transaminase level 02/14/2009   Optic neuritis, left 02/16/2006    Social Hx   Social History   Socioeconomic History   Marital status: Divorced    Spouse name: Not on file   Number of children: Not on file   Years of education: Not on file   Highest education level: Not on file  Occupational History   Not on file  Tobacco Use   Smoking status: Never   Smokeless tobacco: Never  Vaping Use   Vaping Use: Never used  Substance and Sexual Activity   Alcohol use: No   Drug use: No   Sexual activity: Never    Birth  control/protection: Abstinence  Other Topics Concern   Not on file  Social History Narrative   Not on file   Social Determinants of Health   Financial Resource Strain: Low Risk  (05/30/2021)   Overall Financial Resource Strain (CARDIA)    Difficulty of Paying Living Expenses: Not hard at all  Food Insecurity: No Food Insecurity (05/30/2021)   Hunger Vital Sign    Worried About Running Out of Food in the Last Year: Never true    Ran Out of Food in the Last Year: Never true  Transportation Needs: No Transportation Needs (05/30/2021)   PRAPARE - Hydrologist (Medical): No    Lack of Transportation (Non-Medical): No  Physical Activity: Insufficiently Active (05/30/2021)   Exercise Vital Sign    Days of Exercise per Week: 5 days    Minutes of Exercise per Session: 10 min  Stress: Stress Concern Present (05/30/2021)   [place]    Feeling of Stress : To some extent  Social Connections: Moderately Integrated (05/30/2021)   Social Connection and Isolation Panel [NHANES]    Frequency of Communication with Friends and Family: More than three times a week    Frequency of Social Gatherings with Friends and Family: Three times a week    Attends Religious Services: More than 4 times  per year    Active Member of Clubs or Organizations: Yes    Attends Archivist Meetings: More than 4 times per year    Marital Status: Divorced    Review of Systems Per HPI  Objective:  BP (!) 113/56   Pulse 78   Temp (!) 97.5 F (36.4 C) (Oral)   Ht 5' 8"  (1.727 m)   Wt 195 lb (88.5 kg)   BMI 29.65 kg/m      02/09/2022    3:45 PM 02/06/2022    3:49 PM 01/17/2022    2:54 PM  BP/Weight  Systolic BP 275 170 017  Diastolic BP 56 86 93  Wt. (Lbs) 195 195.4   BMI 29.65 kg/m2 29.71 kg/m2     Physical Exam Vitals and nursing note reviewed.  Constitutional:      General: He is not in acute  distress.    Appearance: He is not ill-appearing.  HENT:     Head: Normocephalic and atraumatic.     Mouth/Throat:     Pharynx: Oropharynx is clear.  Eyes:     General:        Right eye: No discharge.        Left eye: No discharge.     Conjunctiva/sclera: Conjunctivae normal.  Cardiovascular:     Rate and Rhythm: Normal rate and regular rhythm.  Pulmonary:     Effort: Pulmonary effort is normal.     Breath sounds: Normal breath sounds. No wheezing or rales.  Neurological:     Mental Status: He is alert.     Lab Results  Component Value Date   WBC 10.5 10/13/2020   HGB 14.0 10/13/2020   HCT 46.3 10/13/2020   PLT 266 10/13/2020   GLUCOSE 133 (H) 01/01/2022   CHOL 134 07/14/2021   TRIG 173 (H) 07/14/2021   HDL 29 (L) 07/14/2021   LDLCALC 75 07/14/2021   ALT 25 03/07/2021   AST 31 03/07/2021   NA 143 01/01/2022   K 4.6 01/01/2022   CL 107 (H) 01/01/2022   CREATININE 1.22 01/01/2022   BUN 14 01/01/2022   CO2 22 01/01/2022   PSA 2.39 04/30/2014   INR 1.1 07/10/2020   HGBA1C 6.5 (H) 01/01/2022   MICROALBUR 6.55 (H) 04/14/2013     Assessment & Plan:   Problem List Items Addressed This Visit       Other   Right temporal headache - Primary    Well appearing on exam. ESR today to evaluate for possibility of Temporal Arteritis.       Relevant Orders   Sedimentation Rate (Completed)   Thersa Salt DO [place]

## 2022-02-13 ENCOUNTER — Telehealth: Payer: Self-pay | Admitting: *Deleted

## 2022-02-13 NOTE — Telephone Encounter (Signed)
Left message to return call 

## 2022-02-13 NOTE — Telephone Encounter (Signed)
Patient has been informed per drs recommendations, he verbalizes understanding.

## 2022-02-13 NOTE — Telephone Encounter (Signed)
Please inform patient that blood test (ESR) was normal. If his headaches continue to occur, he needs to follow up with Dr. [name].   Dr. Lacinda Axon

## 2022-02-15 ENCOUNTER — Encounter: Payer: Self-pay | Admitting: Family Medicine

## 2022-02-15 ENCOUNTER — Ambulatory Visit (INDEPENDENT_AMBULATORY_CARE_PROVIDER_SITE_OTHER): Payer: Medicare Other | Admitting: Family Medicine

## 2022-02-15 VITALS — BP 132/78 | Wt 199.2 lb

## 2022-02-15 DIAGNOSIS — R519 Headache, unspecified: Secondary | ICD-10-CM | POA: Diagnosis not present

## 2022-02-15 NOTE — Progress Notes (Signed)
   Subjective:    Patient ID: Kerry Solis, male    DOB: 07-22-1945, 76 y.o.   MRN: [id number]  HPI Pt arrives due to headache on right side of temple. Pt state his eyeball hurts. Pt states that when the pain hits, it will last about 10 minutes. Pain has improved since seeing Dr.Cook on 02/09/22. No particular time when headache arises.    Review of Systems     Objective:   Physical Exam Temple region nontender neck no masses lungs are clear hearts regular patient relates normally       Assessment & Plan:  Intermittent headaches doing better now and describes as sharp pains along the right side of the head could be trigeminal neuralgia I doubt temporal arteritis because sed rate look normal and the area is not tender currently  He has a follow-up later this fall keep this follow-up

## 2022-03-09 ENCOUNTER — Other Ambulatory Visit: Payer: Self-pay

## 2022-03-09 DIAGNOSIS — E1169 Type 2 diabetes mellitus with other specified complication: Secondary | ICD-10-CM

## 2022-03-09 MED ORDER — METFORMIN HCL 500 MG PO TABS: ORAL_TABLET | ORAL | 1 refills | Status: DC

## 2022-03-13 ENCOUNTER — Ambulatory Visit (INDEPENDENT_AMBULATORY_CARE_PROVIDER_SITE_OTHER): Payer: Medicare Other | Admitting: Family Medicine

## 2022-03-13 ENCOUNTER — Encounter: Payer: Self-pay | Admitting: Family Medicine

## 2022-03-13 VITALS — BP 136/82 | Wt 200.4 lb

## 2022-03-13 DIAGNOSIS — R197 Diarrhea, unspecified: Secondary | ICD-10-CM

## 2022-03-13 DIAGNOSIS — E1169 Type 2 diabetes mellitus with other specified complication: Secondary | ICD-10-CM

## 2022-03-13 DIAGNOSIS — I10 Essential (primary) hypertension: Secondary | ICD-10-CM

## 2022-03-13 DIAGNOSIS — Z23 Encounter for immunization: Secondary | ICD-10-CM | POA: Diagnosis not present

## 2022-03-13 DIAGNOSIS — Z1211 Encounter for screening for malignant neoplasm of colon: Secondary | ICD-10-CM | POA: Diagnosis not present

## 2022-03-13 MED ORDER — METFORMIN HCL ER 500 MG PO TB24: 500.0000 mg | ORAL_TABLET | Freq: Every day | ORAL | 5 refills | Status: DC

## 2022-03-13 NOTE — Progress Notes (Signed)
   Subjective:    Patient ID: Kerry Solis, male    DOB: 1945/08/02, 76 y.o.   MRN: [id number]  Diabetes He presents for his follow-up diabetic visit. He has type 2 diabetes mellitus. (Pt states since he has no teeth he is swallowing foods and keeps a stomachache. ) There are no diabetic associated symptoms. There are no hypoglycemic complications. Risk factors for coronary artery disease include hypertension and male sex. Current diabetic treatment includes diet. He does not see a podiatrist.Eye exam is current.  Hypertension This is a chronic problem. Risk factors for coronary artery disease include diabetes mellitus and male gender. There are no compliance problems.    Pt states he has been thinking slower than normal.  Patient with intermittent rumblings in his abdomen but denies any severe abdominal pain sweats fever chills   Review of Systems     Objective:   Physical Exam  Lungs clear hearts regular pulse normal abdomen soft extremities no edema foot exam normal      Assessment & Plan:  1. Essential hypertension Blood pressure good control continue current measures  2. Controlled type 2 diabetes mellitus with other specified complication, without long-term current use of insulin (HCC) Diet Beatties under good control but having side effects of metformin therefore switch over to extended release 1/day  3. Need for vaccination Flu shot good today - Flu Vaccine QUAD High Dose(Fluad)  4. Encounter for screening colonoscopy Referral for screening colonoscopy not sure if they will do it at his age - Ambulatory referral to Gastroenterology  5. Diarrhea, unspecified type Switch to extended release metformin hopefully this will help Follow-up 4 months

## 2022-03-13 NOTE — Patient Instructions (Signed)
Stop plain Metformin  Start extended release metformin one a day

## 2022-03-22 ENCOUNTER — Encounter: Payer: Self-pay | Admitting: *Deleted

## 2022-04-04 DIAGNOSIS — G35 Multiple sclerosis: Secondary | ICD-10-CM | POA: Diagnosis not present

## 2022-04-04 DIAGNOSIS — I10 Essential (primary) hypertension: Secondary | ICD-10-CM | POA: Diagnosis not present

## 2022-04-04 DIAGNOSIS — Z79899 Other long term (current) drug therapy: Secondary | ICD-10-CM | POA: Diagnosis not present

## 2022-04-04 DIAGNOSIS — B451 Cerebral cryptococcosis: Secondary | ICD-10-CM | POA: Diagnosis not present

## 2022-04-07 NOTE — Progress Notes (Unsigned)
Referring Provider:*** Primary Care Physician:  Kathyrn Drown, MD Primary Gastroenterologist:  Dr. Abbey Chatters  No chief complaint on file.   HPI:   Kerry Solis is a 76 y.o. male with history significant for adenomatous colon polyps, GERD with esophagitis, Schatzki's ring dilated in 2018, fatty liver, elevated transaminases in the past, likely due to fatty liver. History of Hep B exposure in the past, but DNA not detectable. Also with history of HIV following with ID and MS following with [facility].   We have not seen patient since January 2020.   He is presenting today to discuss scheduling surveillance colonoscopy.  Last colonoscopy November 2016 with sessile serrated adenoma.  Recommended 5-year surveillance.  Today:        Past Medical History:  Diagnosis Date   Anxiety    Arthritis    Cryptococcal meningitis ([place])    greater than 15 years ago   Depression    Elevated liver enzymes    Glaucoma    High triglycerides    History of gout    HIV (human immunodeficiency virus infection) ([place])    Hypertension    Leukopenia    Low CD4   Multiple sclerosis ([place])    uses a cane   Myocardial infarction ([place])    45 years ago   Prostate cancer [facility])     Past Surgical History:  Procedure Laterality Date   BIOPSY  08/14/2016   Procedure: BIOPSY;  Surgeon: Danie Binder, MD;  Location: [facility];  Service: Endoscopy;;  gastric bx's   CHOLECYSTECTOMY  06/10/2011   Procedure: LAPAROSCOPIC CHOLECYSTECTOMY;  Surgeon: Donato Heinz, MD;  Location: [facility] ORS;  Service: General;  Laterality: N/A;   COLONOSCOPY  03/10/2009   [id number] internal hemorrhoids/6-mm sessile ascending colon polyp/tortuous colon, diverticulosis. TA   COLONOSCOPY WITH PROPOFOL N/A 04/19/2015   Dr. Oneida Alar: sessile serrated adenoma, surveillance in 5 years    ESOPHAGOGASTRODUODENOSCOPY  06/23/2008   YFV:[id number] gastritis/schatzki ring   ESOPHAGOGASTRODUODENOSCOPY (EGD) WITH PROPOFOL N/A  08/14/2016   Dr. Oneida Alar: moderate Schatzi's ring s/p dilation, LA Grade A esophagitis,small hiatal hernia, chronic gastritis (negative H.pylori), one duodenal diverticulum   LAPAROSCOPIC APPENDECTOMY N/A 12/13/2017   Procedure: APPENDECTOMY LAPAROSCOPIC;  Surgeon: Aviva Signs, MD;  Location: [facility] ORS;  Service: General;  Laterality: N/A;   NECK SURGERY  unsure   disc   POLYPECTOMY N/A 04/19/2015   Procedure: POLYPECTOMY;  Surgeon: Danie Binder, MD;  Location: [facility] OR[facility];  Service: Endoscopy;  Laterality: N/A;  ascending colon   PROSTATE BIOPSY     RADIOACTIVE SEED IMPLANT N/A 11/29/2016   Procedure: RADIOACTIVE SEED IMPLANT/BRACHYTHERAPY IMPLANT;  Surgeon: Franchot Gallo, MD;  [facility];  Service: Urology;  Laterality: N/A;  81 seeds implanted   SAVORY DILATION N/A 08/14/2016   Procedure: SAVORY DILATION;  Surgeon: Danie Binder, MD;  [facility];  Service: Endoscopy;  Laterality: N/A;    Current Outpatient Medications  Medication Sig Dispense Refill   acetaminophen (TYLENOL) 500 MG tablet Take 500 mg by mouth as needed.     allopurinol (ZYLOPRIM) 100 MG tablet Take 1 tablet (100 mg total) by mouth daily. 28 tablet 11   amitriptyline (ELAVIL) 25 MG tablet Take by mouth daily at 6 (six) AM.     amLODipine (NORVASC) 5 MG tablet TAKE 1 TABLET(5 MG) BY MOUTH DAILY 30 tablet 12   DESCOVY 200-25 MG tablet Take 1 tablet by mouth daily.  gabapentin (NEURONTIN) 100 MG capsule Take 100 mg by mouth 3 (three) times daily.     metFORMIN (GLUCOPHAGE-XR) 500 MG 24 hr tablet Take 1 tablet (500 mg total) by mouth daily with breakfast. 30 tablet 5   ondansetron (ZOFRAN-ODT) 4 MG disintegrating tablet Take 1 tablet (4 mg total) by mouth every 8 (eight) hours as needed for nausea or vomiting. 8 tablet 0   pantoprazole (PROTONIX) 40 MG tablet TAKE 1 TABLET(40 MG) BY MOUTH DAILY (Patient taking differently: as needed. TAKE 1 TABLET(40 MG) BY MOUTH DAILY) 30 tablet 12    sildenafil (VIAGRA) 100 MG tablet Take 1 tablet (100 mg total) by mouth daily as needed for erectile dysfunction. 6 tablet 0   silodosin (RAPAFLO) 8 MG CAPS capsule Take 1 capsule (8 mg total) by mouth daily with breakfast. 30 capsule 11   TIVICAY 50 MG tablet Take 50 mg by mouth daily.     No current facility-administered medications for this visit.    Allergies as of 04/09/2022 - Review Complete 03/13/2022  Allergen Reaction Noted   Pravastatin  06/06/2015   Yellow jacket venom Swelling 03/14/2016    Family History  Problem Relation Age of Onset   Hypertension Mother    Heart attack Mother    Heart attack Father    Cancer Sister        Breast   Diabetes Sister    Diabetes Brother    Cancer Brother        unknown   Cancer Brother        colon   Cancer Sister        breast   Cancer Brother        prostate   Cancer Brother        colon    Social History   Socioeconomic History   Marital status: Divorced    Spouse name: Not on file   Number of children: Not on file   Years of education: Not on file   Highest education level: Not on file  Occupational History   Not on file  Tobacco Use   Smoking status: Never   Smokeless tobacco: Never  Vaping Use   Vaping Use: Never used  Substance and Sexual Activity   Alcohol use: No   Drug use: No   Sexual activity: Never    Birth control/protection: Abstinence  Other Topics Concern   Not on file  Social History Narrative   Not on file   Social Determinants of Health   Financial Resource Strain: Low Risk  (05/30/2021)   Overall Financial Resource Strain (CARDIA)    Difficulty of Paying Living Expenses: Not hard at all  Food Insecurity: No Food Insecurity (05/30/2021)   Hunger Vital Sign    Worried About Running Out of Food in the Last Year: Never true    [place] in the Last Year: Never true  Transportation Needs: No Transportation Needs (05/30/2021)   PRAPARE - Hydrologist  (Medical): No    Lack of Transportation (Non-Medical): No  Physical Activity: Insufficiently Active (05/30/2021)   Exercise Vital Sign    Days of Exercise per Week: 5 days    Minutes of Exercise per Session: 10 min  Stress: Stress Concern Present (05/30/2021)   [place]    Feeling of Stress : To some extent  Social Connections: Moderately Integrated (05/30/2021)   Social Connection and Isolation Panel [NHANES]  Frequency of Communication with Friends and Family: More than three times a week    Frequency of Social Gatherings with Friends and Family: Three times a week    Attends Religious Services: More than 4 times per year    Active Member of Clubs or Organizations: Yes    Attends Archivist Meetings: More than 4 times per year    Marital Status: Divorced  Intimate Partner Violence: Not At Risk (05/30/2021)   Humiliation, Afraid, Rape, and Kick questionnaire    Fear of Current or Ex-Partner: No    Emotionally Abused: No    Physically Abused: No    Sexually Abused: No    Review of Systems: Gen: Denies any fever, chills, cold or flulike symptoms, presyncope, syncope.  CV: Denies chest pain, heart palpitations. Resp: Denies shortness of breath, cough.  GI: See HPI GU : Denies urinary burning, urinary frequency, urinary hesitancy MS: Denies joint pain. Derm: Denies rash. Psych: Denies depression, anxiety. Heme: See HPI  Physical Exam: There were no vitals taken for this visit. General:   Alert and oriented. Pleasant and cooperative. Well-nourished and well-developed.  Head:  Normocephalic and atraumatic. Eyes:  Without icterus, sclera clear and conjunctiva pink.  Ears:  Normal auditory acuity. Lungs:  Clear to auscultation bilaterally. No wheezes, rales, or rhonchi. No distress.  Heart:  S1, S2 present without murmurs appreciated.  Abdomen:  +BS, soft, non-tender and non-distended. No HSM noted.  No guarding or rebound. No masses appreciated.  Rectal:  Deferred  Msk:  Symmetrical without gross deformities. Normal posture. Extremities:  Without edema. Neurologic:  Alert and  oriented x4;  grossly normal neurologically. Skin:  Intact without significant lesions or rashes. Psych:  Normal mood and affect.    Assessment:     Plan:  ***   Aliene Altes, PA-C [facility] 04/09/2022

## 2022-04-09 ENCOUNTER — Encounter: Payer: Self-pay | Admitting: Gastroenterology

## 2022-04-09 ENCOUNTER — Ambulatory Visit (INDEPENDENT_AMBULATORY_CARE_PROVIDER_SITE_OTHER): Payer: Medicare Other | Admitting: Gastroenterology

## 2022-04-09 VITALS — BP 181/80 | HR 76 | Temp 97.7°F | Ht 68.0 in | Wt 200.6 lb

## 2022-04-09 DIAGNOSIS — R1030 Lower abdominal pain, unspecified: Secondary | ICD-10-CM | POA: Diagnosis not present

## 2022-04-09 DIAGNOSIS — Z86010 Personal history of colonic polyps: Secondary | ICD-10-CM

## 2022-04-09 NOTE — Patient Instructions (Signed)
If you change your mind about scheduling a colonoscopy, please let us know.  Continue to monitor your mild abdominal pain.  If you have persistent symptoms or any worsening symptoms, please let us know.  I recommend that you chop your meats finely or consume ground meats as you are unable to chew well at this time.  We will plan to see you back as needed.  Do not hesitate to call if you have any questions or concerns.  It was very nice to meet you today!  Aliene Altes, PA-C [facility] Gastroenter[facility]

## 2022-04-24 ENCOUNTER — Other Ambulatory Visit: Payer: Self-pay | Admitting: Family Medicine

## 2022-05-26 ENCOUNTER — Other Ambulatory Visit: Payer: Self-pay | Admitting: Family Medicine

## 2022-05-29 DIAGNOSIS — G35 Multiple sclerosis: Secondary | ICD-10-CM | POA: Diagnosis not present

## 2022-05-29 DIAGNOSIS — Z0389 Encounter for observation for other suspected diseases and conditions ruled out: Secondary | ICD-10-CM | POA: Diagnosis not present

## 2022-06-08 ENCOUNTER — Ambulatory Visit (INDEPENDENT_AMBULATORY_CARE_PROVIDER_SITE_OTHER): Payer: Medicare Other

## 2022-06-08 VITALS — BP 134/72 | Ht 68.0 in | Wt 209.0 lb

## 2022-06-08 DIAGNOSIS — Z0000 Encounter for general adult medical examination without abnormal findings: Secondary | ICD-10-CM | POA: Diagnosis not present

## 2022-06-08 NOTE — Progress Notes (Signed)
Subjective:   Kerry Solis is a 76 y.o. male who presents for Medicare Annual/Subsequent preventive examination.  Review of Systems     Cardiac Risk Factors include: advanced age (>63mn, >>54women);diabetes mellitus;hypertension;male gender;dyslipidemia     Objective:    Today's Vitals   06/08/22 1111  BP: 134/72  Weight: 209 lb (94.8 kg)  Height: _0  (1.727 m)   Body mass index is 31.78 kg/m.     06/08/2022   11:45 AM 09/04/2021    4:11 AM 05/30/2021    1:26 PM 10/13/2020    7:49 AM 07/14/2020    8:33 PM 07/10/2020    2:40 PM 12/13/2017   11:17 PM  Advanced Directives  Does Patient Have a Medical Advance Directive? Yes No Yes No No Yes No  Type of Advance Directive Living will;Healthcare Power of [place]   [place]  Does patient want to make changes to medical advance directive? No - Patient declined        Copy of HHaworthin Chart? No - copy requested  No - copy requested      Would patient like information on creating a medical advance directive?  No - Patient declined No - Patient declined    No - Patient declined    Current Medications (verified) Outpatient Encounter Medications as of 06/08/2022  Medication Sig   acetaminophen (TYLENOL) 500 MG tablet Take 500 mg by mouth as needed.   allopurinol (ZYLOPRIM) 100 MG tablet TAKE ONE TABLET BY MOUTH EVERY DAY FOR GOUT   amLODipine (NORVASC) 5 MG tablet TAKE ONE TABLET BY MOUTH EVERY DAY HIGH BLOOD PRESSURE   DESCOVY 200-25 MG tablet Take 1 tablet by mouth daily.   gabapentin (NEURONTIN) 100 MG capsule Take 100 mg by mouth 3 (three) times daily.   metFORMIN (GLUCOPHAGE-XR) 500 MG 24 hr tablet Take 1 tablet (500 mg total) by mouth daily with breakfast.   ondansetron (ZOFRAN-ODT) 4 MG disintegrating tablet Take 1 tablet (4 mg total) by mouth every 8 (eight) hours as needed for nausea or vomiting.   pantoprazole (PROTONIX) 40 MG tablet  TAKE ONE TABLET BY MOUTH EVERY DAY   sildenafil (VIAGRA) 100 MG tablet Take 1 tablet (100 mg total) by mouth daily as needed for erectile dysfunction.   silodosin (RAPAFLO) 8 MG CAPS capsule Take 1 capsule (8 mg total) by mouth daily with breakfast.   TIVICAY 50 MG tablet Take 50 mg by mouth daily.   No facility-administered encounter medications on file as of 06/08/2022.    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   Depression    Elevated liver enzymes    Glaucoma    High triglycerides    History of gout    HIV (human immunodeficiency virus infection) ([place]    Hypertension    Leukopenia    Low CD4   Multiple sclerosis ([place]    uses a cane   Myocardial infarction ([place]    45 years ago   Prostate cancer ([facility]    Past Surgical History:  Procedure Laterality Date   BIOPSY  08/14/2016   Procedure: BIOPSY;  Surgeon: SDanie Binder MD;  [facility];  Service: Endoscopy;;  gastric bx's   CHOLECYSTECTOMY  06/10/2011  Procedure: LAPAROSCOPIC CHOLECYSTECTOMY;  Surgeon: Donato Heinz, MD;  Location: [facility];  Service: General;  Laterality: N/A;   COLONOSCOPY  03/10/2009   DJS:HFWYOVZC internal hemorrhoids/6-mm sessile ascending colon polyp/tortuous colon, diverticulosis. TA   COLONOSCOPY WITH PROPOFOL N/A 04/19/2015   Dr. Oneida Alar: sessile serrated adenoma, surveillance in 5 years    ESOPHAGOGASTRODUODENOSCOPY  06/23/2008   HYI:FOYDXAJ gastritis/schatzki ring   ESOPHAGOGASTRODUODENOSCOPY (EGD) WITH PROPOFOL N/A 08/14/2016   Dr. Oneida Alar: moderate Schatzi's ring s/p dilation, LA Grade A esophagitis,small hiatal hernia, chronic gastritis (negative H.pylori), one duodenal diverticulum   LAPAROSCOPIC APPENDECTOMY N/A 12/13/2017   Procedure: APPENDECTOMY LAPAROSCOPIC;  Surgeon: Aviva Signs, MD;  Location: [facility];  Service:  General;  Laterality: N/A;   NECK SURGERY  unsure   disc   POLYPECTOMY N/A 04/19/2015   Procedure: POLYPECTOMY;  Surgeon: Danie Binder, MD;  Location: [facility];  Service: Endoscopy;  Laterality: N/A;  ascending colon   PROSTATE BIOPSY     RADIOACTIVE SEED IMPLANT N/A 11/29/2016   Procedure: RADIOACTIVE SEED IMPLANT/BRACHYTHERAPY IMPLANT;  Surgeon: Franchot Gallo, MD;  Location: [facility];  Service: Urology;  Laterality: N/A;  81 seeds implanted   SAVORY DILATION N/A 08/14/2016   Procedure: SAVORY DILATION;  Surgeon: Danie Binder, MD;  [facility];  Service: Endoscopy;  Laterality: N/A;   Family History  Problem Relation Age of Onset   Hypertension Mother    Heart attack Mother    Heart attack Father    Cancer Sister        Breast   Diabetes Sister    Diabetes Brother    Cancer Brother        unknown   Cancer Brother        colon   Cancer Sister        breast   Cancer Brother        prostate   Cancer Brother        colon   Social History   Socioeconomic History   Marital status: Divorced    Spouse name: Not on file   Number of children: Not on file   Years of education: Not on file   Highest education level: Not on file  Occupational History   Not on file  Tobacco Use   Smoking status: Never   Smokeless tobacco: Never  Vaping Use   Vaping Use: Never used  Substance and Sexual Activity   Alcohol use: No   Drug use: No   Sexual activity: Never    Birth control/protection: Abstinence  Other Topics Concern   Not on file  Social History Narrative   Lives alone with dog   Daughter lives in [place] Determinants of Health   Financial Resource Strain: Low Risk  (06/08/2022)   Overall Financial Resource Strain (CARDIA)    Difficulty of Paying Living Expenses: Not hard at all  Food Insecurity: No Food Insecurity (06/08/2022)   Hunger Vital Sign    Worried About Running Out of Food in the Last Year: Never true    Ran Out of  Food in the Last Year: Never true  Transportation Needs: No Transportation Needs (06/08/2022)   PRAPARE - Hydrologist (Medical): No    Lack of Transportation (Non-Medical): No  Physical Activity: Inactive (06/08/2022)   Exercise Vital Sign    Days of Exercise per Week: 0 days    Minutes of Exercise per Session: 0 min  Stress: No  Stress Concern Present (06/08/2022)   Ona    Feeling of Stress : Only a little  Social Connections: Moderately Integrated (06/08/2022)   Social Connection and Isolation Panel [NHANES]    Frequency of Communication with Friends and Family: More than three times a week    Frequency of Social Gatherings with Friends and Family: Three times a week    Attends Religious Services: More than 4 times per year    Active Member of Clubs or Organizations: Yes    Attends Music therapist: More than 4 times per year    Marital Status: Divorced    Tobacco Counseling Counseling given: Not Answered   Clinical Intake:  Pre-visit preparation completed: Yes  Pain : No/denies pain  Diabetes: Yes CBG done?: No Did pt. bring in CBG monitor from home?: No  How often do you need to have someone help you when you read instructions, pamphlets, or other written materials from your doctor or pharmacy?: 1 - Never  Diabetic?Yes  Nutrition Risk Assessment:  Has the patient had any N/V/D within the last 2 months?  No  Does the patient have any non-healing wounds?  No  Has the patient had any unintentional weight loss or weight gain?  No   Diabetes:  Is the patient diabetic?  Yes  If diabetic, was a CBG obtained today?  No  Did the patient bring in their glucometer from home?  No  How often do you monitor your CBG's? daily.   Financial Strains and Diabetes Management:  Are you having any financial strains with the device, your supplies or your medication? No  .  Does the patient want to be seen by Chronic Care Management for management of their diabetes?  No  Would the patient like to be referred to a Nutritionist or for Diabetic Management?  No   Diabetic Exams:  Diabetic Eye Exam: Completed [facility]  Diabetic Foot Exam: Completed 03/13/22   Interpreter Needed?: No  Information entered by :: Denman George LPN   Activities of Daily Living    06/08/2022   11:45 AM  In your present state of health, do you have any difficulty performing the following activities:  Hearing? 0  Vision? 0  Difficulty concentrating or making decisions? 0  Walking or climbing stairs? 1  Comment without cane  Dressing or bathing? 0  Doing errands, shopping? 0  Preparing Food and eating ? N  Using the Toilet? N  In the past six months, have you accidently leaked urine? N  Do you have problems with loss of bowel control? N  Managing your Medications? N  Managing your Finances? N  Housekeeping or managing your Housekeeping? N    Patient Care Team: Kathyrn Drown, MD as PCP - General (Family Medicine) Danie Binder, MD (Inactive) as Consulting Physician (Gastroenterology) Eloise Harman, DO as Consulting Physician (Internal Medicine) Kassie Mends, RN as [place] Management Ala Bent, MD as Referring Physician (Neurology) Alyson Ingles Candee Furbish, MD as Consulting Physician (Urology) Alspaugh, Ruthe Mannan, MD as Referring Physician (Infectious Diseases) Shirl Harris, OD (Optometry)  Indicate any recent Medical Services you may have received from other than [facility] providers in the past year (date may be approximate).     Assessment:   This is a routine wellness examination for Harith.  Hearing/Vision screen Hearing Screening - Comments:: Denies hearing difficulty  Vision Screening - Comments:: Wears  rx glasses - up to date with routine eye exams with [facility]   Dietary issues and exercise  activities discussed: Current Exercise Habits: The patient does not participate in regular exercise at present   Goals Addressed             This Visit's Progress    Prevent falls        Depression Screen    06/08/2022   11:43 AM 05/30/2021    1:18 PM 03/07/2021    9:30 AM 01/10/2021   11:01 AM 08/08/2020    3:04 PM 04/11/2020   11:19 AM 12/21/2016    9:36 AM  PHQ 2/9 Scores  PHQ - 2 Score 0 2 0 _0 0  PHQ- 9 Score  _1 Fall Risk    06/08/2022   11:43 AM 05/30/2021    1:27 PM 03/07/2021    9:30 AM 01/10/2021   11:01 AM 10/28/2020   10:43 AM  Fall Risk   Falls in the past year? 0 1 0 0 0  Number falls in past yr: 0 1  0   Injury with Fall? 0 0  0   Risk for fall due to : No Fall Risks Impaired balance/gait;Impaired mobility;Impaired vision;History of fall(s) Impaired balance/gait;Impaired mobility No Fall Risks   Follow up Falls evaluation completed;Education provided;Falls prevention discussed Falls prevention discussed Falls evaluation completed Falls evaluation completed Falls evaluation completed    FALL RISK PREVENTION PERTAINING TO THE HOME:  Any stairs in or around the home? No  If so, are there any without handrails? No  Home free of loose throw rugs in walkways, pet beds, electrical cords, etc? Yes  Adequate lighting in your home to reduce risk of falls? Yes   ASSISTIVE DEVICES UTILIZED TO PREVENT FALLS:  Life alert? Yes  Use of a cane, walker or w/c? Yes  Grab bars in the bathroom? Yes  Shower chair or bench in shower? No  Elevated toilet seat or a handicapped toilet? Yes   TIMED UP AND GO:  Was the test performed? Yes .  Length of time to ambulate 10 feet: 5 sec.   Gait slow and steady with assistive device  Cognitive Function:      01/23/2020    6:39 AM  Montreal Cognitive Assessment   Visuospatial/ Executive (0/5) 2  Naming (0/3) 3  Attention: Read list of digits (0/2) 2  Attention: Read list of letters (0/1) 1  Attention:  Serial 7 subtraction starting at 100 (0/3) 1  Language: Repeat phrase (0/2) 2  Language : Fluency (0/1) 1  Abstraction (0/2) 1  Delayed Recall (0/5) 0  Orientation (0/6) 3  Total 16  Adjusted Score (based on education) 17      06/08/2022   11:45 AM 05/30/2021    1:33 PM  6CIT Screen  What Year? 0 points 0 points  What month? 0 points 0 points  What time? 0 points 0 points  Count back from 20 0 points 0 points  Months in reverse 2 points 4 points  Repeat phrase 2 points 2 points  Total Score 4 points 6 points    Immunizations Immunization History  Administered Date(s) Administered   Fluad Quad(high Dose 65+) 03/14/2020, 04/11/2020, 03/07/2021, 03/13/2022   Hep A / Hep B 08/09/2010   Influenza Whole 03/19/2011   Influenza,inj,Quad PF,6+ Mos 03/11/2013, 04/30/2014, 03/08/2015, 03/23/2016, 02/28/2017, 03/14/2018, 03/19/2019   Influenza-Unspecified 04/02/2012, 03/17/2013, 02/24/2014, 03/16/2015, 03/29/2016  Moderna Sars-Covid-2 Vaccination 08/18/2019, 09/22/2019   Pneumococcal Conjugate-13 01/25/2014, 03/30/2015   Pneumococcal Polysaccharide-23 03/19/2011   Zoster Recombinat (Shingrix) 12/05/2018, 02/02/2019    TDAP status: Due, Education has been provided regarding the importance of this vaccine. Advised may receive this vaccine at local pharmacy or Health Dept. Aware to provide a copy of the vaccination record if obtained from local pharmacy or Health Dept. Verbalized acceptance and understanding.  Flu Vaccine status: Up to date  Pneumococcal vaccine status: Up to date  Covid-19 vaccine status: Information provided on how to obtain vaccines.   Qualifies for Shingles Vaccine? Yes   Zostavax completed No   Shingrix Completed?: Yes  Screening Tests Health Maintenance  Topic Date Due   DTaP/Tdap/Td (1 - Tdap) Never done   OPHTHALMOLOGY EXAM  09/17/2018   HEMOGLOBIN A1C  07/04/2022   Diabetic kidney evaluation - Urine ACR  08/19/2022   Diabetic kidney evaluation -  eGFR measurement  01/02/2023   FOOT EXAM  03/14/2023   Medicare Annual Wellness (AWV)  06/09/2023   Pneumonia Vaccine 47+ Years old  Completed   INFLUENZA VACCINE  Completed   Hepatitis C Screening  Completed   Zoster Vaccines- Shingrix  Completed   HPV VACCINES  Aged Out   COLONOSCOPY (Pts 45-21yr Insurance coverage will need to be confirmed)  Discontinued   COVID-19 Vaccine  Discontinued    Health Maintenance  Health Maintenance Due  Topic Date Due   DTaP/Tdap/Td (1 - Tdap) Never done   OPHTHALMOLOGY EXAM  09/17/2018    Colorectal cancer screening: No longer required.   Lung Cancer Screening: (Low Dose CT Chest recommended if Age 78-80years, 30 pack-year currently smoking OR have quit w/in 15years.) does not qualify.   Lung Cancer Screening Referral: n/a   Additional Screening:  Hepatitis C Screening: does qualify; Completed 10/06/15  Vision Screening: Recommended annual ophthalmology exams for early detection of glaucoma and other disorders of the eye. Is the patient up to date with their annual eye exam?  Yes  Who is the provider or what is the name of the [facility] which the patient [facility] If pt is not established with a provider, would they like to be referred to a provider to establish care? No .   Dental Screening: Recommended annual dental exams for proper oral hygiene  Community Resource Referral / Chronic Care Management: CRR required this visit?  No   CCM required this visit?  No      Plan:     I have personally reviewed and noted the following in the patient's chart:   Medical and social history Use of alcohol, tobacco or illicit drugs  Current medications and supplements including opioid prescriptions. Patient is not currently taking opioid prescriptions. Functional ability and status Nutritional status Physical activity Advanced directives List of other physicians Hospitalizations, surgeries, and ER visits in  previous 12 months Vitals Screenings to include cognitive, depression, and falls Referrals and appointments  In addition, I have reviewed and discussed with patient certain preventive protocols, quality metrics, and best practice recommendations. A written personalized care plan for preventive services as well as general preventive health recommendations were provided to patient.     SDenman GeorgeBGresham [place]  [id number]  Nurse Notes: No concerns

## 2022-06-08 NOTE — Patient Instructions (Signed)
Mr. Kerry Solis , Thank you for taking time to come for your Medicare Wellness Visit. I appreciate your ongoing commitment to your health goals. Please review the following plan we discussed and let me know if I can assist you in the future.   These are the goals we discussed:  Goals      DIET - REDUCE CALORIE INTAKE     Pt states he would like to lose weight.        This is a list of the screening recommended for you and due dates:  Health Maintenance  Topic Date Due   DTaP/Tdap/Td vaccine (1 - Tdap) Never done   Eye exam for diabetics  09/17/2018   Hemoglobin A1C  07/04/2022   Yearly kidney health urinalysis for diabetes  08/19/2022   Yearly kidney function blood test for diabetes  01/02/2023   Complete foot exam   03/14/2023   Medicare Annual Wellness Visit  06/09/2023   Pneumonia Vaccine  Completed   Flu Shot  Completed   Hepatitis C Screening: USPSTF Recommendation to screen - Ages 18-79 yo.  Completed   Zoster (Shingles) Vaccine  Completed   HPV Vaccine  Aged Out   Colon Cancer Screening  Discontinued   COVID-19 Vaccine  Discontinued    Advanced directives: Please bring a copy of your health care power of attorney and living will to the office to be added to your chart at your convenience.   Conditions/risks identified: Aim for 30 minutes of exercise or brisk walking, 6-8 glasses of water, and 5 servings of fruits and vegetables each day.   Next appointment: Follow up in one year for your annual wellness visit.   Preventive Care 65 Years and Older, Male  Preventive care refers to lifestyle choices and visits with your health care provider that can promote health and wellness. What does preventive care include? A yearly physical exam. This is also called an annual well check. Dental exams once or twice a year. Routine eye exams. Ask your health care provider how often you should have your eyes checked. Personal lifestyle choices, including: Daily care of your teeth and  gums. Regular physical activity. Eating a healthy diet. Avoiding tobacco and drug use. Limiting alcohol use. Practicing safe sex. Taking low doses of aspirin every day. Taking vitamin and mineral supplements as recommended by your health care provider. What happens during an annual well check? The services and screenings done by your health care provider during your annual well check will depend on your age, overall health, lifestyle risk factors, and family history of disease. Counseling  Your health care provider may ask you questions about your: Alcohol use. Tobacco use. Drug use. Emotional well-being. Home and relationship well-being. Sexual activity. Eating habits. History of falls. Memory and ability to understand (cognition). Work and work Statistician. Screening  You may have the following tests or measurements: Height, weight, and BMI. Blood pressure. Lipid and cholesterol levels. These may be checked every 5 years, or more frequently if you are over 94 years old. Skin check. Lung cancer screening. You may have this screening every year starting at age 29 if you have a 30-pack-year history of smoking and currently smoke or have quit within the past 15 years. Fecal occult blood test (FOBT) of the stool. You may have this test every year starting at age 26. Flexible sigmoidoscopy or colonoscopy. You may have a sigmoidoscopy every 5 years or a colonoscopy every 10 years starting at age 29. Prostate cancer screening. Recommendations  will vary depending on your family history and other risks. Hepatitis C blood test. Hepatitis B blood test. Sexually transmitted disease (STD) testing. Diabetes screening. This is done by checking your blood sugar (glucose) after you have not eaten for a while (fasting). You may have this done every 1-3 years. Abdominal aortic aneurysm (AAA) screening. You may need this if you are a current or former smoker. Osteoporosis. You may be screened  starting at age 39 if you are at high risk. Talk with your health care provider about your test results, treatment options, and if necessary, the need for more tests. Vaccines  Your health care provider may recommend certain vaccines, such as: Influenza vaccine. This is recommended every year. Tetanus, diphtheria, and acellular pertussis (Tdap, Td) vaccine. You may need a Td booster every 10 years. Zoster vaccine. You may need this after age 22. Pneumococcal 13-valent conjugate (PCV13) vaccine. One dose is recommended after age 30. Pneumococcal polysaccharide (PPSV23) vaccine. One dose is recommended after age 33. Talk to your health care provider about which screenings and vaccines you need and how often you need them. This information is not intended to replace advice given to you by your health care provider. Make sure you discuss any questions you have with your health care provider. Document Released: 07/01/2015 Document Revised: 02/22/2016 Document Reviewed: 04/05/2015 Elsevier Interactive Patient Education  2017 [employer] Prevention in the Home Falls can cause injuries. They can happen to people of all ages. There are many things you can do to make your home safe and to help prevent falls. What can I do on the outside of my home? Regularly fix the edges of walkways and driveways and fix any cracks. Remove anything that might make you trip as you walk through a door, such as a raised step or threshold. Trim any bushes or trees on the path to your home. Use bright outdoor lighting. Clear any walking paths of anything that might make someone trip, such as rocks or tools. Regularly check to see if handrails are loose or broken. Make sure that both sides of any steps have handrails. Any raised decks and porches should have guardrails on the edges. Have any leaves, snow, or ice cleared regularly. Use sand or salt on walking paths during winter. Clean up any spills in your garage  right away. This includes oil or grease spills. What can I do in the bathroom? Use night lights. Install grab bars by the toilet and in the tub and shower. Do not use towel bars as grab bars. Use non-skid mats or decals in the tub or shower. If you need to sit down in the shower, use a plastic, non-slip stool. Keep the floor dry. Clean up any water that spills on the floor as soon as it happens. Remove soap buildup in the tub or shower regularly. Attach bath mats securely with double-sided non-slip rug tape. Do not have throw rugs and other things on the floor that can make you trip. What can I do in the bedroom? Use night lights. Make sure that you have a light by your bed that is easy to reach. Do not use any sheets or blankets that are too big for your bed. They should not hang down onto the floor. Have a firm chair that has side arms. You can use this for support while you get dressed. Do not have throw rugs and other things on the floor that can make you trip. What can I do  in the kitchen? Clean up any spills right away. Avoid walking on wet floors. Keep items that you use a lot in easy-to-reach places. If you need to reach something above you, use a strong step stool that has a grab bar. Keep electrical cords out of the way. Do not use floor polish or wax that makes floors slippery. If you must use wax, use non-skid floor wax. Do not have throw rugs and other things on the floor that can make you trip. What can I do with my stairs? Do not leave any items on the stairs. Make sure that there are handrails on both sides of the stairs and use them. Fix handrails that are broken or loose. Make sure that handrails are as long as the stairways. Check any carpeting to make sure that it is firmly attached to the stairs. Fix any carpet that is loose or worn. Avoid having throw rugs at the top or bottom of the stairs. If you do have throw rugs, attach them to the floor with carpet tape. Make  sure that you have a light switch at the top of the stairs and the bottom of the stairs. If you do not have them, ask someone to add them for you. What else can I do to help prevent falls? Wear shoes that: Do not have high heels. Have rubber bottoms. Are comfortable and fit you well. Are closed at the toe. Do not wear sandals. If you use a stepladder: Make sure that it is fully opened. Do not climb a closed stepladder. Make sure that both sides of the stepladder are locked into place. Ask someone to hold it for you, if possible. Clearly mark and make sure that you can see: Any grab bars or handrails. First and last steps. Where the edge of each step is. Use tools that help you move around (mobility aids) if they are needed. These include: Canes. Walkers. Scooters. Crutches. Turn on the lights when you go into a dark area. Replace any light bulbs as soon as they burn out. Set up your furniture so you have a clear path. Avoid moving your furniture around. If any of your floors are uneven, fix them. If there are any pets around you, be aware of where they are. Review your medicines with your doctor. Some medicines can make you feel dizzy. This can increase your chance of falling. Ask your doctor what other things that you can do to help prevent falls. This information is not intended to replace advice given to you by your health care provider. Make sure you discuss any questions you have with your health care provider. Document Released: 03/31/2009 Document Revised: 11/10/2015 Document Reviewed: 07/09/2014 Elsevier Interactive Patient Education  2017 [employer].

## 2022-06-21 ENCOUNTER — Ambulatory Visit (INDEPENDENT_AMBULATORY_CARE_PROVIDER_SITE_OTHER): Payer: Medicare Other | Admitting: Family Medicine

## 2022-06-21 VITALS — BP 138/82 | HR 85 | Temp 98.0°F | Wt 207.8 lb

## 2022-06-21 DIAGNOSIS — B20 Human immunodeficiency virus [HIV] disease: Secondary | ICD-10-CM

## 2022-06-21 DIAGNOSIS — I10 Essential (primary) hypertension: Secondary | ICD-10-CM | POA: Diagnosis not present

## 2022-06-21 DIAGNOSIS — M1000 Idiopathic gout, unspecified site: Secondary | ICD-10-CM

## 2022-06-21 DIAGNOSIS — E785 Hyperlipidemia, unspecified: Secondary | ICD-10-CM

## 2022-06-21 DIAGNOSIS — E1169 Type 2 diabetes mellitus with other specified complication: Secondary | ICD-10-CM

## 2022-06-21 DIAGNOSIS — G35 Multiple sclerosis: Secondary | ICD-10-CM

## 2022-06-21 DIAGNOSIS — C61 Malignant neoplasm of prostate: Secondary | ICD-10-CM

## 2022-06-21 MED ORDER — ALLOPURINOL 100 MG PO TABS: ORAL_TABLET | ORAL | 5 refills | Status: DC

## 2022-06-21 MED ORDER — PANTOPRAZOLE SODIUM 40 MG PO TBEC: DELAYED_RELEASE_TABLET | ORAL | 5 refills | Status: DC

## 2022-06-21 MED ORDER — METFORMIN HCL ER 500 MG PO TB24: 500.0000 mg | ORAL_TABLET | Freq: Every day | ORAL | 5 refills | Status: DC

## 2022-06-21 MED ORDER — AMLODIPINE BESYLATE 5 MG PO TABS: ORAL_TABLET | ORAL | 5 refills | Status: DC

## 2022-06-21 NOTE — Progress Notes (Signed)
Subjective:    Patient ID: Kerry Solis, male    DOB: 1946/05/21, 77 y.o.   MRN: [id number]  HPI Patient arrives today for 3 mouth follow up for diabetic medications. 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 He states he is able to do do local driving safely He denies getting lost or having any accidents He does avoid nighttime driving Patient here for follow-up regarding cholesterol.    Patient relates taking medication on a regular basis Denies problems with medication Importance of dietary measures discussed Regular lab work regarding lipid and liver was checked and if needing additional labs was appropriately ordered  The patient was seen today as part of a comprehensive diabetic check up. Patient has diabetes Patient relates good compliance with taking the medication. We discussed their diet and exercise activities  We also discussed the importance of notifying us if any excessively high glucoses or low sugars.    Patient for blood pressure check up.  The patient does have hypertension.   Patient relates dietary measures try to minimize salt The importance of healthy diet and activity were discussed Patient relates compliance  Patient has history of prostate cancer followed by specialist History of MS followed by specialist HIV followed by specialist   Review of Systems     Objective:   Physical Exam  General-in no acute distress Eyes-no  discharge Lungs-respiratory rate normal, CTA CV-no murmurs,RRR Extremities skin warm dry no edema Neuro grossly normal Behavior normal, alert       Assessment & Plan:   1. Essential hypertension HTN- patient seen for follow-up regarding HTN.   Diet, medication compliance, appropriate labs and refills were completed.   Importance of keeping blood pressure under good control to lessen the risk of complications discussed Regular follow-up visits discussed   2. Controlled type 2 diabetes mellitus with other specified complication, without long-term current use of insulin ([place]) The patient was seen today as part of a comprehensive visit for diabetes. The importance of keeping her A1c at or below 7 range was discussed.  Discussed diet, activity, and medication compliance Emphasized healthy eating primarily with vegetables fruits and if utilizing meats lean meats such as chicken or fish grilled baked broiled Avoid sugary drinks Minimize and avoid processed foods Fit in regular physical activity preferably 25 to 30 minutes 4 times per week Standard follow-up visit recommended.  Patient aware lack of control and follow-up increases risk of diabetic complications. Regular follow-up visits Yearly ophthalmology Yearly foot exam  - Hemoglobin A1c  3. Hyperlipidemia associated with type 2 diabetes mellitus (HCC) Hyperlipidemia-importance of diet, weight control, activity, compliance with medications discussed.   Recent labs reviewed.   Any additional labs or refills ordered.   Importance of keeping under good control discussed. Regular follow-up visits discussed  - Lipid panel  4. Idiopathic gout, unspecified chronicity, unspecified site Uric acid no gout flareups recently continue current medication - Uric Acid  5. Multiple sclerosis ([place]) See specialist on a regular basis-stable currently  6. Currently asymptomatic HIV infection, with history  of HIV-related illness [facility]) See  specialist on a regular basis-undetectable currently  7. Prostate cancer [facility]) See specialist on a regular basis-in remission currently  Follow-up in 3 months

## 2022-06-22 LAB — URIC ACID: Uric Acid: 6.5 mg/dL (ref 3.8–8.4)

## 2022-06-22 LAB — LIPID PANEL
Chol/HDL Ratio: 6.2 ratio — ABNORMAL HIGH (ref 0.0–5.0)
Cholesterol, Total: 212 mg/dL — ABNORMAL HIGH (ref 100–199)
HDL: 34 mg/dL — ABNORMAL LOW (ref >39)
LDL Chol Calc (NIH): 141 mg/dL — ABNORMAL HIGH (ref 0–99)
Triglycerides: 206 mg/dL — ABNORMAL HIGH (ref 0–149)
VLDL Cholesterol Cal: 37 mg/dL (ref 5–40)

## 2022-06-22 LAB — HEMOGLOBIN A1C
Est. average glucose Bld gHb Est-mCnc: 163 mg/dL
Hgb A1c MFr Bld: 7.3 % — ABNORMAL HIGH (ref 4.8–5.6)

## 2022-07-02 MED ORDER — EZETIMIBE 10 MG PO TABS: 10.0000 mg | ORAL_TABLET | Freq: Every day | ORAL | 5 refills | Status: DC

## 2022-07-02 MED ORDER — METFORMIN HCL ER 500 MG PO TB24: 500.0000 mg | ORAL_TABLET | Freq: Two times a day (BID) | ORAL | 5 refills | Status: DC

## 2022-07-02 NOTE — Addendum Note (Signed)
Addended by: Dairl Ponder on: 07/02/2022 03:53 PM   Modules accepted: Orders

## 2022-07-23 DIAGNOSIS — Z8616 Personal history of COVID-19: Secondary | ICD-10-CM | POA: Diagnosis not present

## 2022-07-23 DIAGNOSIS — G35 Multiple sclerosis: Secondary | ICD-10-CM | POA: Diagnosis not present

## 2022-07-23 DIAGNOSIS — Z79620 Long term (current) use of immunosuppressive biologic: Secondary | ICD-10-CM | POA: Diagnosis not present

## 2022-10-03 DIAGNOSIS — I10 Essential (primary) hypertension: Secondary | ICD-10-CM | POA: Diagnosis not present

## 2022-10-03 DIAGNOSIS — Z79899 Other long term (current) drug therapy: Secondary | ICD-10-CM | POA: Diagnosis not present

## 2022-10-03 DIAGNOSIS — G35 Multiple sclerosis: Secondary | ICD-10-CM | POA: Diagnosis not present

## 2022-10-03 DIAGNOSIS — B451 Cerebral cryptococcosis: Secondary | ICD-10-CM | POA: Diagnosis not present

## 2022-10-03 DIAGNOSIS — Z792 Long term (current) use of antibiotics: Secondary | ICD-10-CM | POA: Diagnosis not present

## 2022-10-22 ENCOUNTER — Ambulatory Visit: Payer: Medicare Other | Admitting: Family Medicine

## 2022-10-23 ENCOUNTER — Encounter: Payer: Self-pay | Admitting: Family Medicine

## 2022-10-23 ENCOUNTER — Ambulatory Visit (INDEPENDENT_AMBULATORY_CARE_PROVIDER_SITE_OTHER): Payer: Medicare Other | Admitting: Family Medicine

## 2022-10-23 VITALS — BP 138/86 | HR 73 | Ht 68.0 in | Wt 200.8 lb

## 2022-10-23 DIAGNOSIS — T466X5A Adverse effect of antihyperlipidemic and antiarteriosclerotic drugs, initial encounter: Secondary | ICD-10-CM

## 2022-10-23 DIAGNOSIS — E785 Hyperlipidemia, unspecified: Secondary | ICD-10-CM | POA: Diagnosis not present

## 2022-10-23 DIAGNOSIS — Z7984 Long term (current) use of oral hypoglycemic drugs: Secondary | ICD-10-CM | POA: Diagnosis not present

## 2022-10-23 DIAGNOSIS — E1169 Type 2 diabetes mellitus with other specified complication: Secondary | ICD-10-CM

## 2022-10-23 DIAGNOSIS — I10 Essential (primary) hypertension: Secondary | ICD-10-CM | POA: Diagnosis not present

## 2022-10-23 DIAGNOSIS — G35 Multiple sclerosis: Secondary | ICD-10-CM

## 2022-10-23 DIAGNOSIS — M791 Myalgia, unspecified site: Secondary | ICD-10-CM

## 2022-10-23 MED ORDER — VALSARTAN 40 MG PO TABS: ORAL_TABLET | ORAL | 1 refills | Status: DC

## 2022-10-23 MED ORDER — ALLOPURINOL 100 MG PO TABS: ORAL_TABLET | ORAL | 5 refills | Status: DC

## 2022-10-23 MED ORDER — AMLODIPINE BESYLATE 5 MG PO TABS: ORAL_TABLET | ORAL | 5 refills | Status: DC

## 2022-10-23 MED ORDER — PANTOPRAZOLE SODIUM 40 MG PO TBEC: DELAYED_RELEASE_TABLET | ORAL | 5 refills | Status: DC

## 2022-10-23 MED ORDER — EZETIMIBE 10 MG PO TABS: 10.0000 mg | ORAL_TABLET | Freq: Every day | ORAL | 5 refills | Status: DC

## 2022-10-23 MED ORDER — METFORMIN HCL ER 500 MG PO TB24: 500.0000 mg | ORAL_TABLET | Freq: Two times a day (BID) | ORAL | 5 refills | Status: DC

## 2022-10-23 NOTE — Progress Notes (Unsigned)
Subjective:    Patient ID: Kerry Solis, male    DOB: Feb 20, 1946, 77 y.o.   MRN: [id number]  HPI Patient arrives today for 4 month follow up. Patient relates that he has noticed that his blood pressure has been elevated He also relates a lot of fatigue and tiredness He has underlying HIV he also has problems with MS he is under the care of a specialist for this He has labs pending for underlying diabetes hyperlipidemia arthralgias and muscle soreness he will do these in the near future  Patient states he's tired all the time.   Outpatient Encounter Medications as of 10/23/2022  Medication Sig   gabapentin (NEURONTIN) 100 MG capsule Take 100 mg by mouth 3 (three) times daily.   silodosin (RAPAFLO) 8 MG CAPS capsule Take 1 capsule (8 mg total) by mouth daily with breakfast.   valsartan (DIOVAN) 40 MG tablet 1/2 tablet a day   [DISCONTINUED] allopurinol (ZYLOPRIM) 100 MG tablet TAKE ONE TABLET BY MOUTH EVERY DAY FOR GOUT   [DISCONTINUED] amLODipine (NORVASC) 5 MG tablet TAKE ONE TABLET BY MOUTH EVERY DAY HIGH BLOOD PRESSURE   [DISCONTINUED] ezetimibe (ZETIA) 10 MG tablet Take 1 tablet (10 mg total) by mouth daily.   [DISCONTINUED] metFORMIN (GLUCOPHAGE-XR) 500 MG 24 hr tablet Take 1 tablet (500 mg total) by mouth 2 (two) times daily with a meal.   acetaminophen (TYLENOL) 500 MG tablet Take 500 mg by mouth as needed. (Patient not taking: Reported on 10/23/2022)   allopurinol (ZYLOPRIM) 100 MG tablet TAKE ONE TABLET BY MOUTH EVERY DAY FOR GOUT   amLODipine (NORVASC) 5 MG tablet TAKE ONE TABLET BY MOUTH EVERY DAY HIGH BLOOD PRESSURE   DESCOVY 200-25 MG tablet Take 1 tablet by mouth daily. (Patient not taking: Reported on 10/23/2022)   ezetimibe (ZETIA) 10 MG tablet Take 1 tablet (10 mg total) by mouth daily.   metFORMIN (GLUCOPHAGE-XR) 500 MG 24 hr tablet Take 1 tablet (500 mg total) by mouth 2 (two) times daily with a meal.   ondansetron (ZOFRAN-ODT) 4 MG disintegrating tablet Take 1 tablet (4 mg  total) by mouth every 8 (eight) hours as needed for nausea or vomiting. (Patient not taking: Reported on 06/21/2022)   pantoprazole (PROTONIX) 40 MG tablet TAKE ONE TABLET BY MOUTH EVERY DAY   sildenafil (VIAGRA) 100 MG tablet Take 1 tablet (100 mg total) by mouth daily as needed for erectile dysfunction. (Patient not taking: Reported on 06/21/2022)   TIVICAY 50 MG tablet Take 50 mg by mouth daily. (Patient not taking: Reported on 10/23/2022)   [DISCONTINUED] pantoprazole (PROTONIX) 40 MG tablet TAKE ONE TABLET BY MOUTH EVERY DAY   No facility-administered encounter medications on file as of 10/23/2022.     Review of Systems     Objective:   Physical Exam  General-in no acute distress Eyes-no discharge Lungs-respiratory rate normal, CTA CV-no murmurs,RRR Extremities skin warm dry no edema Neuro grossly normal Behavior normal, alert       Assessment & Plan:  1. Essential hypertension We will add low-dose valsartan he will do follow-up lab work within the next 7 to 14 days-2 blood pressure readings were elevated 1 was 1 right at the borderline at best it would be in his best interest to add a low-dose blood pressure medicine Healthy diet We will do a follow-up visit mid July  2. Controlled type 2 diabetes mellitus with other specified complication, without long-term current use of insulin (HCC) Check A1c.  He is on metformin  we will be checking GFR - Hemoglobin A1c - Basic metabolic panel - Microalbumin / creatinine urine ratio  3. Hyperlipidemia associated with type 2 diabetes mellitus (HCC) Does not tolerate statins Is on Zetia Healthy diet recommended - Lipid panel  4. Multiple sclerosis (HCC) Under the care of specialist this could be causing fatigue I encouraged him to embark on a mild physical activity 3 days a week nothing vigorous  5. Myalgia due to statin We will check lab work but in addition to this I recommend staying away from statins we will use Zetia -  C-reactive protein - CK Follow-up in July

## 2022-11-14 DIAGNOSIS — G35 Multiple sclerosis: Secondary | ICD-10-CM | POA: Diagnosis not present

## 2022-11-14 DIAGNOSIS — M791 Myalgia, unspecified site: Secondary | ICD-10-CM | POA: Diagnosis not present

## 2022-11-27 DIAGNOSIS — G35 Multiple sclerosis: Secondary | ICD-10-CM | POA: Diagnosis not present

## 2022-12-11 DIAGNOSIS — H2513 Age-related nuclear cataract, bilateral: Secondary | ICD-10-CM | POA: Diagnosis not present

## 2022-12-11 DIAGNOSIS — H524 Presbyopia: Secondary | ICD-10-CM | POA: Diagnosis not present

## 2022-12-17 DIAGNOSIS — E785 Hyperlipidemia, unspecified: Secondary | ICD-10-CM | POA: Diagnosis not present

## 2022-12-17 DIAGNOSIS — T466X5A Adverse effect of antihyperlipidemic and antiarteriosclerotic drugs, initial encounter: Secondary | ICD-10-CM | POA: Diagnosis not present

## 2022-12-17 DIAGNOSIS — E1169 Type 2 diabetes mellitus with other specified complication: Secondary | ICD-10-CM | POA: Diagnosis not present

## 2022-12-17 DIAGNOSIS — M791 Myalgia, unspecified site: Secondary | ICD-10-CM | POA: Diagnosis not present

## 2022-12-18 ENCOUNTER — Telehealth: Payer: Self-pay | Admitting: Family Medicine

## 2022-12-18 LAB — LIPID PANEL
Chol/HDL Ratio: 4.3 ratio (ref 0.0–5.0)
Cholesterol, Total: 158 mg/dL (ref 100–199)
HDL: 37 mg/dL — ABNORMAL LOW (ref >39)
LDL Chol Calc (NIH): 97 mg/dL (ref 0–99)
Triglycerides: 133 mg/dL (ref 0–149)
VLDL Cholesterol Cal: 24 mg/dL (ref 5–40)

## 2022-12-18 LAB — MICROALBUMIN / CREATININE URINE RATIO
Creatinine, Urine: 112.6 mg/dL
Microalb/Creat Ratio: 23 mg/g creat (ref 0–29)
Microalbumin, Urine: 26.1 ug/mL

## 2022-12-18 LAB — HEMOGLOBIN A1C
Est. average glucose Bld gHb Est-mCnc: 146 mg/dL
Hgb A1c MFr Bld: 6.7 % — ABNORMAL HIGH (ref 4.8–5.6)

## 2022-12-18 LAB — BASIC METABOLIC PANEL
BUN/Creatinine Ratio: 10 (ref 10–24)
BUN: 14 mg/dL (ref 8–27)
CO2: 23 mmol/L (ref 20–29)
Calcium: 9.7 mg/dL (ref 8.6–10.2)
Chloride: 106 mmol/L (ref 96–106)
Creatinine, Ser: 1.42 mg/dL — ABNORMAL HIGH (ref 0.76–1.27)
Glucose: 104 mg/dL — ABNORMAL HIGH (ref 70–99)
Potassium: 5.9 mmol/L — ABNORMAL HIGH (ref 3.5–5.2)
Sodium: 142 mmol/L (ref 134–144)
eGFR: 50 mL/min/{1.73_m2} — ABNORMAL LOW (ref >59)

## 2022-12-18 LAB — CK: Total CK: 150 U/L (ref 41–331)

## 2022-12-18 LAB — C-REACTIVE PROTEIN: CRP: 1 mg/L (ref 0–10)

## 2022-12-18 NOTE — Telephone Encounter (Signed)
Nurses-please see result note regarding Kerry Solis's labs he needs to stop Diovan and keep his follow-up visit

## 2022-12-29 ENCOUNTER — Other Ambulatory Visit: Payer: Self-pay | Admitting: Urology

## 2023-01-01 ENCOUNTER — Ambulatory Visit (INDEPENDENT_AMBULATORY_CARE_PROVIDER_SITE_OTHER): Payer: Medicare Other | Admitting: Family Medicine

## 2023-01-01 ENCOUNTER — Encounter: Payer: Self-pay | Admitting: Family Medicine

## 2023-01-01 VITALS — BP 144/80 | HR 78 | Wt 202.0 lb

## 2023-01-01 DIAGNOSIS — E875 Hyperkalemia: Secondary | ICD-10-CM | POA: Diagnosis not present

## 2023-01-01 NOTE — Progress Notes (Signed)
   Subjective:    Patient ID: Kerry Solis, male    DOB: 12-08-1945, 77 y.o.   MRN: [id number]  HPI Patient arrives today for follow up visit. Patient states he forgot to bring his medications with him and he does not remember exactly what he is taking. Patient states he forgot to take his medications this morning.  Very nice gentleman Try to do the best he can with his health Unfortunately his short-term memory is not so good He states he forgot to take his medicines earlier today He did not bring his medications in for review He states he did stop the medicine he thinks it was Diovan his potassium was elevated on the previous lab work he denies any type of chest tightness pressure pain shortness of breath he does state he is trying to eat relatively healthy   Review of Systems     Objective:   Physical Exam  General-in no acute distress Eyes-no discharge Lungs-respiratory rate normal, CTA CV-no murmurs,RRR Extremities skin warm dry no edema Neuro grossly normal Behavior normal, alert       Assessment & Plan:  Await repeat lab Poor short-term memory Patient states he is able to drive to places that he is familiar with has not got lost Able to do his own shopping Does his own housework His family members handles his finances Blood pressure slightly elevated today but he relates he forgot to take his medicine today Patient to do lab work He was told to stop Diovan - He was told this previously Follow-up 4 to 6 weeks bring medications with him

## 2023-01-02 ENCOUNTER — Other Ambulatory Visit: Payer: Self-pay

## 2023-01-02 DIAGNOSIS — E875 Hyperkalemia: Secondary | ICD-10-CM | POA: Diagnosis not present

## 2023-01-03 LAB — BASIC METABOLIC PANEL (7)
BUN/Creatinine Ratio: 11 (ref 10–24)
BUN: 14 mg/dL (ref 8–27)
CO2: 20 mmol/L (ref 20–29)
Chloride: 104 mmol/L (ref 96–106)
Creatinine, Ser: 1.25 mg/dL (ref 0.76–1.27)
Glucose: 101 mg/dL — ABNORMAL HIGH (ref 70–99)
Potassium: 5.1 mmol/L (ref 3.5–5.2)
Sodium: 143 mmol/L (ref 134–144)
eGFR: 59 mL/min/{1.73_m2} — ABNORMAL LOW (ref >59)

## 2023-01-04 ENCOUNTER — Encounter: Payer: Self-pay | Admitting: Family Medicine

## 2023-01-04 NOTE — Progress Notes (Signed)
Plz mail 

## 2023-01-16 ENCOUNTER — Encounter (HOSPITAL_COMMUNITY): Payer: Self-pay

## 2023-01-16 ENCOUNTER — Other Ambulatory Visit: Payer: Self-pay

## 2023-01-16 ENCOUNTER — Other Ambulatory Visit: Payer: Medicare Other

## 2023-01-16 ENCOUNTER — Emergency Department (HOSPITAL_COMMUNITY): Payer: Medicare Other

## 2023-01-16 ENCOUNTER — Emergency Department (HOSPITAL_COMMUNITY)
Admission: EM | Admit: 2023-01-16 | Discharge: 2023-01-16 | Disposition: A | Discharge status: Home / Self Care | Payer: Medicare Other | Attending: Student | Admitting: Student

## 2023-01-16 DIAGNOSIS — R531 Weakness: Secondary | ICD-10-CM | POA: Diagnosis not present

## 2023-01-16 DIAGNOSIS — I700 Atherosclerosis of aorta: Secondary | ICD-10-CM | POA: Diagnosis not present

## 2023-01-16 DIAGNOSIS — I10 Essential (primary) hypertension: Secondary | ICD-10-CM | POA: Diagnosis not present

## 2023-01-16 DIAGNOSIS — R918 Other nonspecific abnormal finding of lung field: Secondary | ICD-10-CM | POA: Insufficient documentation

## 2023-01-16 DIAGNOSIS — R197 Diarrhea, unspecified: Secondary | ICD-10-CM | POA: Diagnosis not present

## 2023-01-16 DIAGNOSIS — E119 Type 2 diabetes mellitus without complications: Secondary | ICD-10-CM | POA: Diagnosis not present

## 2023-01-16 DIAGNOSIS — K760 Fatty (change of) liver, not elsewhere classified: Secondary | ICD-10-CM | POA: Insufficient documentation

## 2023-01-16 DIAGNOSIS — N281 Cyst of kidney, acquired: Secondary | ICD-10-CM | POA: Diagnosis not present

## 2023-01-16 DIAGNOSIS — R911 Solitary pulmonary nodule: Secondary | ICD-10-CM

## 2023-01-16 DIAGNOSIS — N200 Calculus of kidney: Secondary | ICD-10-CM | POA: Insufficient documentation

## 2023-01-16 DIAGNOSIS — K5730 Diverticulosis of large intestine without perforation or abscess without bleeding: Secondary | ICD-10-CM | POA: Diagnosis not present

## 2023-01-16 LAB — CBC WITH DIFFERENTIAL/PLATELET
Abs Immature Granulocytes: 0.02 10*3/uL (ref 0.00–0.07)
Basophils Absolute: 0 10*3/uL (ref 0.0–0.1)
Basophils Relative: 0 %
Eosinophils Absolute: 0 10*3/uL (ref 0.0–0.5)
Eosinophils Relative: 0 %
HCT: 43.2 % (ref 39.0–52.0)
Hemoglobin: 13.8 g/dL (ref 13.0–17.0)
Immature Granulocytes: 0 %
Lymphocytes Relative: 29 %
Lymphs Abs: 1.9 10*3/uL (ref 0.7–4.0)
MCH: 28.8 pg (ref 26.0–34.0)
MCHC: 31.9 g/dL (ref 30.0–36.0)
MCV: 90.2 fL (ref 80.0–100.0)
Monocytes Absolute: 1 10*3/uL (ref 0.1–1.0)
Monocytes Relative: 15 %
Neutro Abs: 3.7 10*3/uL (ref 1.7–7.7)
Neutrophils Relative %: 56 %
Platelets: 250 10*3/uL (ref 150–400)
RBC: 4.79 MIL/uL (ref 4.22–5.81)
RDW: 14.2 % (ref 11.5–15.5)
WBC: 6.7 10*3/uL (ref 4.0–10.5)
nRBC: 0 % (ref 0.0–0.2)

## 2023-01-16 LAB — COMPREHENSIVE METABOLIC PANEL
ALT: 33 U/L (ref 0–44)
AST: 46 U/L — ABNORMAL HIGH (ref 15–41)
Albumin: 4.3 g/dL (ref 3.5–5.0)
Alkaline Phosphatase: 67 U/L (ref 38–126)
Anion gap: 12 (ref 5–15)
BUN: 19 mg/dL (ref 8–23)
CO2: 21 mmol/L — ABNORMAL LOW (ref 22–32)
Calcium: 8.9 mg/dL (ref 8.9–10.3)
Chloride: 104 mmol/L (ref 98–111)
Creatinine, Ser: 1.41 mg/dL — ABNORMAL HIGH (ref 0.61–1.24)
GFR, Estimated: 51 mL/min — ABNORMAL LOW (ref >60)
Glucose, Bld: 96 mg/dL (ref 70–99)
Potassium: 3.6 mmol/L (ref 3.5–5.1)
Sodium: 137 mmol/L (ref 135–145)
Total Bilirubin: 0.8 mg/dL (ref 0.3–1.2)
Total Protein: 8.4 g/dL — ABNORMAL HIGH (ref 6.5–8.1)

## 2023-01-16 LAB — URINALYSIS, ROUTINE W REFLEX MICROSCOPIC
Bacteria, UA: NONE SEEN
Bilirubin Urine: NEGATIVE
Glucose, UA: NEGATIVE mg/dL
Ketones, ur: NEGATIVE mg/dL
Leukocytes,Ua: NEGATIVE
Nitrite: NEGATIVE
Protein, ur: 100 mg/dL — AB
Specific Gravity, Urine: 1.035 — ABNORMAL HIGH (ref 1.005–1.030)
pH: 5 (ref 5.0–8.0)

## 2023-01-16 LAB — LIPASE, BLOOD: Lipase: 47 U/L (ref 11–51)

## 2023-01-16 MED ORDER — LOPERAMIDE HCL 2 MG PO CAPS: 2.0000 mg | ORAL_CAPSULE | Freq: Four times a day (QID) | ORAL | 0 refills | Status: DC | PRN

## 2023-01-16 MED ORDER — IOHEXOL 300 MG/ML  SOLN
100.0000 mL | Freq: Once | INTRAMUSCULAR | Status: AC | PRN
  Administered 2023-01-16: 100 mL via INTRAVENOUS

## 2023-01-16 MED ORDER — SODIUM CHLORIDE 0.9 % IV BOLUS
500.0000 mL | Freq: Once | INTRAVENOUS | Status: AC
  Administered 2023-01-16: 500 mL via INTRAVENOUS

## 2023-01-16 NOTE — ED Provider Notes (Signed)
[place] EMERGENCY DEPARTMENT AT [facility] Provider Note   CSN: [id number] Arrival date & time: 01/16/23  1244     History  Chief Complaint  Patient presents with   Weakness    Kerry Solis is a 77 y.o. male.  Patient with history of HIV complaint with discovy, MS, hypertension, diabetes presents today with complaints of weakness and diarrhea. He states that same began over 2 weeks ago. Reports that every time he eats he immediately has diarrhea. Denies any abdominal pain, nausea, or vomiting. States he had a similar occurrence many years ago but is not sure if he had a diagnosis at that time. Denies any hematochezia or melena. No fevers or chills. Reports that it is not correlated with specific foods. Denies any recent travel or exposures. He lives at home. Denies any recent antibiotic use. No sick contacts.   The history is provided by the patient. No language interpreter was used.  Weakness Associated symptoms: diarrhea        Home Medications Prior to Admission medications   Medication Sig Start Date End Date Taking? Authorizing Provider  acetaminophen (TYLENOL) 500 MG tablet Take 500 mg by mouth as needed.    [provider]  allopurinol (ZYLOPRIM) 100 MG tablet TAKE ONE TABLET BY MOUTH EVERY DAY FOR GOUT 10/23/22   Babs Sciara, MD  amLODipine (NORVASC) 5 MG tablet TAKE ONE TABLET BY MOUTH EVERY DAY HIGH BLOOD PRESSURE 10/23/22   Babs Sciara, MD  DESCOVY 200-25 MG tablet Take 1 tablet by mouth daily. Patient not taking: Reported on 10/23/2022 03/06/21   [provider]  ezetimibe (ZETIA) 10 MG tablet Take 1 tablet (10 mg total) by mouth daily. 10/23/22   Babs Sciara, MD  gabapentin (NEURONTIN) 100 MG capsule Take 100 mg by mouth 3 (three) times daily. 06/29/20   [provider]  metFORMIN (GLUCOPHAGE-XR) 500 MG 24 hr tablet Take 1 tablet (500 mg total) by mouth 2 (two) times daily with a meal. 10/23/22   Luking, Jonna Coup, MD   ondansetron (ZOFRAN-ODT) 4 MG disintegrating tablet Take 1 tablet (4 mg total) by mouth every 8 (eight) hours as needed for nausea or vomiting. Patient not taking: Reported on 06/21/2022 10/13/20   Benjiman Core, MD  pantoprazole (PROTONIX) 40 MG tablet TAKE ONE TABLET BY MOUTH EVERY DAY 10/23/22   Babs Sciara, MD  sildenafil (VIAGRA) 100 MG tablet Take 1 tablet (100 mg total) by mouth daily as needed for erectile dysfunction. Patient not taking: Reported on 06/21/2022 01/17/22   Malen Gauze, MD  silodosin (RAPAFLO) 8 MG CAPS capsule TAKE ONE CAPSULE BY MOUTH EVERY DAY WITH BREAKFAST 01/01/23   McKenzie, Mardene Celeste, MD  TIVICAY 50 MG tablet Take 50 mg by mouth daily. Patient not taking: Reported on 10/23/2022 03/06/21   [provider]      Allergies    Pravastatin and Yellow jacket venom    Review of Systems   Review of Systems  Gastrointestinal:  Positive for diarrhea.  Neurological:  Positive for weakness.  All other systems reviewed and are negative.   Physical Exam Updated Vital Signs BP (!) 158/77 (BP Location: Right Arm)   Pulse 78   Temp 99.3 F (37.4 C) (Oral)   Resp 20   Ht 5\' 8"  (1.727 m)   Wt 91.6 kg   SpO2 94%   BMI 30.71 kg/m  Physical Exam Vitals and nursing note reviewed.  Constitutional:  General: He is not in acute distress.    Appearance: Normal appearance. He is normal weight. He is not ill-appearing, toxic-appearing or diaphoretic.  HENT:     Head: Normocephalic and atraumatic.  Cardiovascular:     Rate and Rhythm: Normal rate.  Pulmonary:     Effort: Pulmonary effort is normal. No respiratory distress.  Abdominal:     General: Abdomen is flat.     Palpations: Abdomen is soft.     Tenderness: There is no abdominal tenderness.  Musculoskeletal:        General: Normal range of motion.     Cervical back: Normal range of motion.     Right lower leg: No edema.     Left lower leg: No edema.  Skin:    General: Skin is warm and dry.   Neurological:     General: No focal deficit present.     Mental Status: He is alert.  Psychiatric:        Mood and Affect: Mood normal.        Behavior: Behavior normal.     ED Results / Procedures / Treatments   Labs (all labs ordered are listed, but only abnormal results are displayed) Labs Reviewed  COMPREHENSIVE METABOLIC PANEL - Abnormal; Notable for the following components:      Result Value   CO2 21 (*)    Creatinine, Ser 1.41 (*)    Total Protein 8.4 (*)    AST 46 (*)    GFR, Estimated 51 (*)    All other components within normal limits  URINALYSIS, ROUTINE W REFLEX MICROSCOPIC - Abnormal; Notable for the following components:   Specific Gravity, Urine 1.035 (*)    Hgb urine dipstick SMALL (*)    Protein, ur 100 (*)    All other components within normal limits  GASTROINTESTINAL PANEL BY PCR, STOOL (REPLACES STOOL CULTURE)  C DIFFICILE QUICK SCREEN W PCR REFLEX    LIPASE, BLOOD  CBC WITH DIFFERENTIAL/PLATELET    EKG EKG Interpretation Date/Time:  Wednesday January 16 2023 13:11:18 EDT Ventricular Rate:  74 PR Interval:    QRS Duration:  103 QT Interval:  406 QTC Calculation: 451 R Axis:   -48  Text Interpretation: Normal sinus rhythm No significant change since last tracing Confirmed by Kommor, Madison ([id number]) on 01/16/2023 2:56:50 PM  Radiology CT ABDOMEN PELVIS W CONTRAST  Result Date: 01/16/2023 CLINICAL DATA:  Diarrhea and weakness for the past 2 weeks. EXAM: CT ABDOMEN AND PELVIS WITH CONTRAST TECHNIQUE: Multidetector CT imaging of the abdomen and pelvis was performed using the standard protocol following bolus administration of intravenous contrast. RADIATION DOSE REDUCTION: This exam was performed according to the departmental dose-optimization program which includes automated exposure control, adjustment of the mA and/or kV according to patient size and/or use of iterative reconstruction technique. CONTRAST:  OMNIPAQUE IOHEXOL 300 MG/ML  SOLN  COMPARISON:  CT abdomen pelvis dated October 13, 2020. FINDINGS: Lower chest: No acute abnormality. New irregular ill-defined ground-glass nodule in the right lower lobe measuring 2.7 x 1.7 cm (series 4, image 11). Hepatobiliary: Unchanged diffusely decreased liver density. No focal abnormality. Status post cholecystectomy. No biliary dilatation. Pancreas: Unremarkable. No pancreatic ductal dilatation or surrounding inflammatory changes. Spleen: Normal in size without focal abnormality. Adrenals/Urinary Tract: Adrenal glands are unremarkable. Unchanged bilateral renal simple cysts measuring up to 2.5 cm. No follow-up imaging is recommended. Unchanged 4 mm calculus in the lower pole of the left kidney. No hydronephrosis. The bladder is unremarkable. Stomach/Bowel:  Stomach is within normal limits. Appendix appears normal. No evidence of bowel wall thickening, distention, or inflammatory changes. Moderate left-sided colonic diverticulosis. Vascular/Lymphatic: Aortic atherosclerosis. No enlarged abdominal or pelvic lymph nodes. 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 Other: No free fluid or pneumoperitoneum. Musculoskeletal: No acute or significant osseous findings. IMPRESSION: 1. No acute intra-abdominal process. 2. New irregular ill-defined ground-glass nodule in the right lower lobe measuring 2.7 x 1.7 cm. Initial follow-up with CT at 6 months is recommended to confirm persistence. If persistent, repeat CT is recommended every 2 years until 5 years of stability has been established. This recommendation follows the consensus statement: Guidelines for Management of Incidental Pulmonary Nodules Detected on CT Images: From the Fleischner Society 2017; Radiology 2017; 284:228-243. 3. Unchanged hepatic steatosis. 4. Unchanged nonobstructive left nephrolithiasis. 5.  Aortic Atherosclerosis (ICD10-I70.0). Electronically Signed   By: Obie Dredge M.D.   On: 01/16/2023 15:22     Procedures Procedures    Medications Ordered in ED Medications  sodium chloride 0.9 % bolus 500 mL (500 mLs Intravenous New Bag/Given 01/16/23 1402)  iohexol (OMNIPAQUE) 300 MG/ML solution 100 mL (100 mLs Intravenous Contrast Given 01/16/23 1429)    ED Course/ Medical Decision Making/ A&P                                 Medical Decision Making  This patient is a 77 y.o. male who presents to the ED for concern of diarrhea, weakness, this involves an extensive number of treatment options, and is a complaint that carries with it a high risk of complications and morbidity. The emergent differential diagnosis prior to evaluation includes, but is not limited to,  sepsis, neoplasm, infectious diarrhea, IBS, IBD . This is not an exhaustive differential.   Past Medical History / Co-morbidities / Social History: history of HIV complaint with discovy, MS, hypertension, diabetes   Additional history: Chart reviewed. Pertinent results include: Last HIV RNA level on 10/03/22 shows levels undetectable  Physical Exam: Physical exam performed. The pertinent findings include: abdomen soft and non-tender  Lab Tests: I ordered, and personally interpreted labs.  The pertinent results include: No leukocytosis, creatinine 1.4 consistent with previous. Stool studies ordered, unable to provide sample   Imaging Studies: I ordered imaging studies including Ct abdomen pelvis. I independently visualized and interpreted imaging which showed   1. No acute intra-abdominal process. 2. New irregular ill-defined ground-glass nodule in the right lower lobe measuring 2.7 x 1.7 cm. 3. Unchanged hepatic steatosis. 4. Unchanged nonobstructive left nephrolithiasis. 5.  Aortic Atherosclerosis  I agree with the radiologist interpretation.   Cardiac Monitoring:  The patient was maintained on a cardiac monitor.  My attending physician Dr. Posey Rea viewed and interpreted the cardiac monitored which showed an underlying  rhythm of: sinus rhythm. I agree with this interpretation.   Medications: I ordered medication including fluids  for dehydration. Reevaluation of the patient after these medicines showed that the patient improved. I have reviewed the patients home medicines and have made adjustments as needed.   Disposition: After consideration of the diagnostic results and the patients response to treatment, I feel that emergency department workup does not suggest an emergent condition requiring admission or immediate intervention beyond what has been performed at this time. The plan is: discharge with close pcp and GI follow-up. Patient unable to provide stool sample today. He does not have risk factors for cdiff. He is afebrile, non-toxic appearing,  and in no acute distress with reassuring vital signs. After fluids he feels better and is ready to go home. Will send for imodium given duration of symptoms without signs of infectious process.  Evaluation and diagnostic testing in the emergency department does not suggest an emergent condition requiring admission or immediate intervention beyond what has been performed at this time.  Plan for discharge with close PCP follow-up.  Patient is understanding and amenable with plan, educated on red flag symptoms that would prompt immediate return.  Patient discharged in stable condition.   I discussed this case with my attending physician Dr. Posey Rea who cosigned this note including patient's presenting symptoms, physical exam, and planned diagnostics and interventions. Attending physician stated agreement with plan or made changes to plan which were implemented.    Final Clinical Impression(s) / ED Diagnoses Final diagnoses:  Diarrhea, unspecified type  Lung nodule seen on imaging study    Rx / DC Orders ED Discharge Orders          Ordered    loperamide (IMODIUM) 2 MG capsule  4 times daily PRN        01/16/23 1610          An After Visit Summary was printed  and given to the patient.     Vear Clock 01/16/23 1612    Glendora Score, MD 01/16/23 [phone]

## 2023-01-16 NOTE — Discharge Instructions (Addendum)
As we discussed, your workup in the ER today was reassuring for acute findings.  Laboratory evaluation and CT imaging did not did not reveal any etiology of your symptoms.  I have given you a prescription for Imodium for you to take as prescribed as needed to help with your diarrhea.  I recommend that you follow-up with your primary care doctor as well as your GI doctor for continued evaluation and management of your symptoms.  They can collect stool studies as you are unable to give them today.  Additionally, incidentally the CT of your abdomen did show a nodule in your lower lung that needs to have repeat imaging performed within the next 6 months.  This can be done through your primary doctor.  Return if development of any new or worsening symptoms.

## 2023-01-16 NOTE — ED Triage Notes (Signed)
Pt comes in of complaints of diarrhea and weakness for over two weeks. Pt states every time he eats or drinks it goes right through him. Pt denies being around anyone sick, denies n/v, and denies abdominal pain.

## 2023-01-16 NOTE — ED Notes (Signed)
Pt has been unable to provide a stool and urine sample.

## 2023-01-17 ENCOUNTER — Telehealth: Payer: Self-pay | Admitting: Family Medicine

## 2023-01-17 NOTE — Telephone Encounter (Signed)
Daughter (Antionette) called stating patient just come from [facility] on 7/31 with diarrhea and light headed. She wanted a follow up visit with you . I explained he has one on 8/13 . Please advise if he needs to be seen sooner. His granddaughter is staying with him.

## 2023-01-18 NOTE — Telephone Encounter (Signed)
If I have a open slot next week or 1140 slot you may utilize that

## 2023-01-22 ENCOUNTER — Telehealth: Payer: Self-pay | Admitting: Family Medicine

## 2023-01-22 ENCOUNTER — Telehealth: Payer: Self-pay

## 2023-01-22 NOTE — Telephone Encounter (Signed)
Transition Care Management Unsuccessful Follow-up Telephone Call  Date of discharge and from where:  Kerry Solis 7/31  Attempts:  1st Attempt  Reason for unsuccessful TCM follow-up call:  No answer/busy   Kerry Solis [facility] Guide, [facility] [phone] [address] [place], [place], [place] [address] Phone: [phone] Email: [email or web address]

## 2023-01-22 NOTE — Telephone Encounter (Signed)
Patient's daughter Antionette would like to speak with you about her concerns about patient. She states hospital has been trying to call him to check up on him but he hasn't been answering the phone. He is very confused. And would like to discuss it with you before he is seen tomorrow with you. You can call anytime when you are done with patients. Her number is [phone] and she is on his DPR. She just wants you aware of what's going.

## 2023-01-23 ENCOUNTER — Ambulatory Visit: Payer: Medicare Other | Admitting: Urology

## 2023-01-23 ENCOUNTER — Telehealth: Payer: Self-pay

## 2023-01-23 ENCOUNTER — Encounter: Payer: Self-pay | Admitting: Family Medicine

## 2023-01-23 ENCOUNTER — Ambulatory Visit (INDEPENDENT_AMBULATORY_CARE_PROVIDER_SITE_OTHER): Payer: Medicare Other | Admitting: Family Medicine

## 2023-01-23 ENCOUNTER — Telehealth: Payer: Self-pay | Admitting: *Deleted

## 2023-01-23 ENCOUNTER — Ambulatory Visit (HOSPITAL_COMMUNITY)
Admission: RE | Admit: 2023-01-23 | Discharge: 2023-01-23 | Disposition: A | Discharge status: Home / Self Care | Payer: Medicare Other | Source: Ambulatory Visit | Attending: Family Medicine | Admitting: Family Medicine

## 2023-01-23 ENCOUNTER — Other Ambulatory Visit (HOSPITAL_COMMUNITY)
Admission: RE | Admit: 2023-01-23 | Discharge: 2023-01-23 | Disposition: A | Discharge status: Home / Self Care | Payer: Medicare Other | Source: Ambulatory Visit | Attending: Family Medicine | Admitting: Family Medicine

## 2023-01-23 VITALS — BP 124/86 | HR 85 | Temp 98.1°F | Ht 68.0 in | Wt 186.0 lb

## 2023-01-23 DIAGNOSIS — R6881 Early satiety: Secondary | ICD-10-CM

## 2023-01-23 DIAGNOSIS — R918 Other nonspecific abnormal finding of lung field: Secondary | ICD-10-CM | POA: Diagnosis not present

## 2023-01-23 DIAGNOSIS — R634 Abnormal weight loss: Secondary | ICD-10-CM

## 2023-01-23 DIAGNOSIS — R911 Solitary pulmonary nodule: Secondary | ICD-10-CM | POA: Insufficient documentation

## 2023-01-23 DIAGNOSIS — R0989 Other specified symptoms and signs involving the circulatory and respiratory systems: Secondary | ICD-10-CM | POA: Diagnosis not present

## 2023-01-23 LAB — CBC WITH DIFFERENTIAL/PLATELET
Abs Immature Granulocytes: 0.03 10*3/uL (ref 0.00–0.07)
Basophils Absolute: 0 10*3/uL (ref 0.0–0.1)
Basophils Relative: 0 %
Eosinophils Absolute: 0 10*3/uL (ref 0.0–0.5)
Eosinophils Relative: 0 %
HCT: 44.8 % (ref 39.0–52.0)
Hemoglobin: 14.3 g/dL (ref 13.0–17.0)
Immature Granulocytes: 1 %
Lymphocytes Relative: 35 %
Lymphs Abs: 1.9 10*3/uL (ref 0.7–4.0)
MCH: 28.3 pg (ref 26.0–34.0)
MCHC: 31.9 g/dL (ref 30.0–36.0)
MCV: 88.5 fL (ref 80.0–100.0)
Monocytes Absolute: 0.5 10*3/uL (ref 0.1–1.0)
Monocytes Relative: 9 %
Neutro Abs: 2.9 10*3/uL (ref 1.7–7.7)
Neutrophils Relative %: 55 %
Platelets: 504 10*3/uL — ABNORMAL HIGH (ref 150–400)
RBC: 5.06 MIL/uL (ref 4.22–5.81)
RDW: 13.9 % (ref 11.5–15.5)
WBC: 5.3 10*3/uL (ref 4.0–10.5)
nRBC: 0 % (ref 0.0–0.2)

## 2023-01-23 LAB — COMPREHENSIVE METABOLIC PANEL
ALT: 30 U/L (ref 0–44)
AST: 36 U/L (ref 15–41)
Albumin: 4 g/dL (ref 3.5–5.0)
Alkaline Phosphatase: 77 U/L (ref 38–126)
Anion gap: 12 (ref 5–15)
BUN: 23 mg/dL (ref 8–23)
CO2: 21 mmol/L — ABNORMAL LOW (ref 22–32)
Calcium: 9.8 mg/dL (ref 8.9–10.3)
Chloride: 109 mmol/L (ref 98–111)
Creatinine, Ser: 1.06 mg/dL (ref 0.61–1.24)
GFR, Estimated: >60 mL/min (ref >60)
Glucose, Bld: 108 mg/dL — ABNORMAL HIGH (ref 70–99)
Potassium: 4.1 mmol/L (ref 3.5–5.1)
Sodium: 142 mmol/L (ref 135–145)
Total Bilirubin: 0.7 mg/dL (ref 0.3–1.2)
Total Protein: 8.4 g/dL — ABNORMAL HIGH (ref 6.5–8.1)

## 2023-01-23 MED ORDER — AZITHROMYCIN 250 MG PO TABS: ORAL_TABLET | ORAL | 0 refills | Status: AC

## 2023-01-23 MED ORDER — AMOXICILLIN-POT CLAVULANATE 875-125 MG PO TABS: 1.0000 | ORAL_TABLET | Freq: Two times a day (BID) | ORAL | 0 refills | Status: DC

## 2023-01-23 NOTE — Telephone Encounter (Signed)
Transition Care Management Unsuccessful Follow-up Telephone Call  Date of discharge and from where:  [facility]  Attempts:  2nd Attempt  Reason for unsuccessful TCM follow-up call:  No answer/busy   Lenard Forth [facility] Guide, [facility] [phone] [address] [place], [place], [place] [address] Phone: [phone] Email: [email or web address]

## 2023-01-23 NOTE — Progress Notes (Unsigned)
   Subjective:    Patient ID: Kerry Solis, male    DOB: [birth year], 77 y.o.   MRN: [id number]  HPI ER follow up for lung diarrhea, no appetite or ability to keep anything down - has been taking sips of water or ginger ale  C/o no energy Patient with low energy low appetite just not feeling good a lot of nausea no vomiting Lung nodule found on imaging - dry cough and spells   Review of Systems     Objective:   Physical Exam  Lungs clear heart regular pulse normal extremities no edema  I reviewed over CAT scan of the abdomen This had a spiculated pulmonary nodule in the right lower base Patient has a nasty cough to go along with this Need to make sure that this is not lung cancer    Assessment & Plan:  1. Weight loss Concerning Recommend lab work await results  - CBC with Differential - DG Chest 2 View  2. Lung nodule-patient has pneumonia, aggressive looking pulmonary nodule consistent with possible cancer, underlying HIV disease, unintentional weight loss of 17 pounds over the past 3 months. Lab work ordered await results, patient with weight loss coughing need to rule out pneumonia today Will set up CAT scan urgently in the near future Chest x-ray ordered Will need CT scan - CBC with Differential - CMP14+EGFR - DG Chest 2 View  3. Early satiety More than likely related to his underlying illness and weight loss.  Will need further testing - CMP14+EGFR  I have advised patient not to do any driving currently We will reach out to him to schedule a follow-up

## 2023-01-23 NOTE — Progress Notes (Unsigned)
  Care Coordination  Outreach Note  01/23/2023 Name: Kerry Solis MRN: [id number] DOB: 16-Feb-1946   Care Coordination Outreach Attempts: An unsuccessful telephone outreach was attempted today to offer the patient information about available care coordination services.  Follow Up Plan:  Additional outreach attempts will be made to offer the patient care coordination information and services.   Encounter Outcome:  No Answer   Gwenevere Ghazi  Care Coordination Care Guide  Direct Dial: [phone]

## 2023-01-24 ENCOUNTER — Telehealth: Payer: Self-pay | Admitting: Family Medicine

## 2023-01-24 NOTE — Progress Notes (Signed)
  Care Coordination   Note   01/24/2023 Name: INIGO COOPERSTEIN MRN: [id number] DOB: January 30, 1946  [name] is a 77 y.o. year old male who sees Luking, Jonna Coup, MD for primary care. I reached out to [name] by phone today to offer care coordination services.  Mr. Fayson was given information about Care Coordination services today including:   The Care Coordination services include support from the care team which includes your Nurse Coordinator, Clinical Social Worker, or Pharmacist.  The Care Coordination team is here to help remove barriers to the health concerns and goals most important to you. Care Coordination services are voluntary, and the patient may decline or stop services at any time by request to their care team member.   Care Coordination Consent Status: Patient agreed to services and verbal consent obtained.   Follow up plan:  Telephone appointment with care coordination team member scheduled for:  01/30/23  Encounter Outcome:  Pt. Scheduled  [facility] Coordination Care Guide  Direct Dial: [phone]

## 2023-01-25 ENCOUNTER — Telehealth: Payer: Self-pay | Admitting: Family Medicine

## 2023-01-25 DIAGNOSIS — R6881 Early satiety: Secondary | ICD-10-CM

## 2023-01-25 DIAGNOSIS — R634 Abnormal weight loss: Secondary | ICD-10-CM

## 2023-01-25 DIAGNOSIS — R911 Solitary pulmonary nodule: Secondary | ICD-10-CM

## 2023-01-25 NOTE — Telephone Encounter (Signed)
STAT CT ordered in EPIC. Await precert and schedule   Please schedule patient follow up with Dr Lorin Picket in 2-3 weeks per Dr Lorin Picket

## 2023-01-25 NOTE — Telephone Encounter (Signed)
STAT CT ordered in EPIC

## 2023-01-25 NOTE — Telephone Encounter (Signed)
I did communicate with her regarding this.  We are in the process of setting up CT scan of the chest we can communicate further details once we have the results of this  Also acknowledged that Mickel is getting closer to the point of needing to have someone to stay with him to ensure that he stays on target with his medications and treatments currently male family comes into the household frequently, they are doing the driving for him

## 2023-01-25 NOTE — Telephone Encounter (Signed)
In regards to the CAT scan please mark this as urgent/stat to be done Monday Tuesday or Wednesday if possible please thank you

## 2023-01-25 NOTE — Telephone Encounter (Signed)
Nurses Please order CT scan of the chest due to pulmonary nodule spiculated that was seen on CT scan of the abdomen Also patient with left lower lobe pneumonia, HIV, unintended weight loss over the past 6 weeks  Please touch base with his family patient does not always answer the phone His granddaughter Nyeli-came with him on his last visit [phone] His daughter is Sherry Ruffing [phone]  Also schedule him a follow-up with me within the next 3 weeks thank you

## 2023-01-28 ENCOUNTER — Ambulatory Visit (HOSPITAL_COMMUNITY)
Admission: RE | Admit: 2023-01-28 | Discharge: 2023-01-28 | Disposition: A | Discharge status: Home / Self Care | Payer: Medicare Other | Source: Ambulatory Visit | Attending: Family Medicine | Admitting: Family Medicine

## 2023-01-28 DIAGNOSIS — J984 Other disorders of lung: Secondary | ICD-10-CM | POA: Diagnosis not present

## 2023-01-28 DIAGNOSIS — R6881 Early satiety: Secondary | ICD-10-CM | POA: Insufficient documentation

## 2023-01-28 DIAGNOSIS — R634 Abnormal weight loss: Secondary | ICD-10-CM | POA: Diagnosis not present

## 2023-01-28 DIAGNOSIS — R918 Other nonspecific abnormal finding of lung field: Secondary | ICD-10-CM | POA: Diagnosis not present

## 2023-01-28 DIAGNOSIS — R911 Solitary pulmonary nodule: Secondary | ICD-10-CM | POA: Insufficient documentation

## 2023-01-28 DIAGNOSIS — J479 Bronchiectasis, uncomplicated: Secondary | ICD-10-CM | POA: Diagnosis not present

## 2023-01-29 ENCOUNTER — Ambulatory Visit: Payer: Medicare Other | Admitting: Family Medicine

## 2023-01-30 ENCOUNTER — Ambulatory Visit: Payer: Self-pay | Admitting: Licensed Clinical Social Worker

## 2023-01-30 NOTE — Patient Instructions (Signed)
Visit Information  Thank you for taking time to visit with me today. Please don't hesitate to contact me if I can be of assistance to you.   Following are the goals we discussed today:   Goals Addressed             This Visit's Progress    Patient Stated he gets sad sometimes due to inability to do activities he used to do       Interventions:  Spoke with client via phone about client needs Spoke with client about family support. He has support of his granddaughter and of his nephews Client and LCSW spoke of client decreased appetite Spoke of transportation needs Spoke of medication procurement Spoke of [name] Discussed energy level of client. He fatigues occasionally and has to take rest breaks as needed. Discussed walking of client. [name] to help him walk Discussed [name] Discussed MS needs and management for client. He said he sees medical providers at [facility] in [place], [place] 2 times per year for MS management Discussed vision of client. He wears glasses to help with vision Discussed mood of client. He gets sad occasionally since he cannot do some of the activities he used to do [name] Discussed program support with RN, LCSW, Pharmacist Provided counseling support for client Used Active Listening techniques to hear the needs of client Encouraged client to call LCSW as needed for SW support at [phone]           Our next appointment is by telephone on 03/05/23 at 11:00 AM   Please call the care guide team at [phone] if you need to cancel or reschedule your appointment.   If you are experiencing a Mental Health or Behavioral Health Crisis or need someone to talk to, please go to [facility] [address], [place]  [phone])   The patient verbalized understanding of instructions, educational materials, and care plan provided today and DECLINED offer to receive copy of patient instructions, educational materials, and care plan.   The patient has been provided with contact information for the care management team and has been advised to call with any health related questions or concerns.   Kelton Pillar. MSW, LCSW Licensed Visual merchandiser [facility] Care Management [phone]

## 2023-01-30 NOTE — Patient Outreach (Signed)
  Care Coordination   Initial Visit Note   01/30/2023 Name: Kerry Solis MRN: [id number] DOB: 01-24-1946  Kerry Solis is a 77 y.o. year old male who sees Luking, Jonna Coup, MD for primary care. I spoke with  Kerry Solis by phone today.  What matters to the patients health and wellness today? Client gets sad sometimes due to inability to do activities he used to do    Goals Addressed             This Visit's Progress    Patient Stated he gets sad sometimes due to inability to do activities he used to do       Interventions:  Spoke with client via phone about client needs Spoke with client about family support. He has support of his granddaughter and of his nephews Client and LCSW spoke of client decreased appetite Spoke of transportation needs Spoke of medication procurement Spoke of [name] Discussed energy level of client. He fatigues occasionally and has to take rest breaks as needed. Discussed walking of client. [name] to help him walk Discussed [name] Discussed MS needs and management for client. He said he sees medical providers at [facility] in [place], [place] 2 times per year for MS management Discussed vision of client. He wears glasses to help with vision Discussed mood of client. He gets sad occasionally since he cannot do some of the activities he used to do [name] Discussed program support with RN, LCSW, Pharmacist Provided counseling support for client Used Active Listening techniques to hear the needs of client Encouraged client to call LCSW as needed for SW support at [phone]           SDOH assessments and interventions completed:  Yes  SDOH Interventions Today    Flowsheet Row Most Recent Value  SDOH Interventions   Depression  Interventions/Treatment  Counseling  Physical Activity Interventions Other (Comments)  [fatigues when walking]  Stress Interventions Provide Counseling  [has stress in managing health needs]        Care Coordination Interventions:  Yes, provided   Interventions Today    Flowsheet Row Most Recent Value  Chronic Disease   Chronic disease during today's visit Other  [spoke with client about client needs]  General Interventions   General Interventions Discussed/Reviewed General Interventions Discussed, Community Resources  [spoke with Onalee Hua about program support]  Exercise Interventions   Exercise Discussed/Reviewed Physical Activity  [client gets fatigued sometimes when he walks]  Physical Activity Discussed/Reviewed Physical Activity Reviewed  [he uses a cane to help him walk]  Education Interventions   Education Provided Provided Education  Provided Verbal Education On Community Resources  Mental Health Interventions   Mental Health Discussed/Reviewed Coping Strategies  [client said he gets sad occasionally due to difficulty doing some of the activities he used to do]  Nutrition Interventions   Nutrition Discussed/Reviewed Nutrition Discussed  Pharmacy Interventions   Pharmacy Dicussed/Reviewed Pharmacy Topics Discussed       Follow up plan: Follow up call scheduled for 03/05/23 at 11:00 AM    Encounter Outcome:  Pt. Visit Completed   Kelton Pillar. MSW, LCSW Licensed Clinical Social Worker [facility] Care Management [phone]

## 2023-02-11 ENCOUNTER — Encounter: Payer: Self-pay | Admitting: Family Medicine

## 2023-02-11 ENCOUNTER — Ambulatory Visit (INDEPENDENT_AMBULATORY_CARE_PROVIDER_SITE_OTHER): Payer: Medicare Other | Admitting: Family Medicine

## 2023-02-11 VITALS — BP 143/79 | HR 71 | Temp 98.8°F | Wt 192.2 lb

## 2023-02-11 DIAGNOSIS — R634 Abnormal weight loss: Secondary | ICD-10-CM

## 2023-02-11 DIAGNOSIS — J189 Pneumonia, unspecified organism: Secondary | ICD-10-CM

## 2023-02-11 DIAGNOSIS — G35 Multiple sclerosis: Secondary | ICD-10-CM

## 2023-02-11 NOTE — Addendum Note (Signed)
Addended by: Lilyan Punt A on: 02/11/2023 12:47 PM   Modules accepted: Orders

## 2023-02-11 NOTE — Progress Notes (Signed)
   Established Patient Office Visit  Subjective   2-3 week follow up for weight loss and Pneumonia. Patient feeling weak ever since diagnosed with Pneumonia. No further issues or concerns.  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 Multifocal pneumonia Doing much better.  We will plan to do a follow-up CT in early December.  Given his HIV he states he is taking his medication on a regular basis given that he is improving I doubt that this is any type of opportunistic infection at this point  2. Weight loss Patient is actually gaining weight now doing better.  Hopefully will see significant improvement  3. Multiple sclerosis (HCC) We will taper off of the gabapentin I am concerned that this could be contributing to cognitive issues.  He relates at times feeling slightly drowsy and other times having some slight cognitive delay.  Family is helping the patient out but he is at risk I have told him that family should do his driving.  Patient will follow-up again in approximately 3 months I did write out  a plan for him to taper off the medication over the course of the next 2 weeks

## 2023-02-22 ENCOUNTER — Ambulatory Visit: Payer: Medicare Other | Admitting: Urology

## 2023-02-22 VITALS — BP 142/73 | HR 72

## 2023-02-22 DIAGNOSIS — R351 Nocturia: Secondary | ICD-10-CM | POA: Diagnosis not present

## 2023-02-22 DIAGNOSIS — C61 Malignant neoplasm of prostate: Secondary | ICD-10-CM | POA: Diagnosis not present

## 2023-02-22 DIAGNOSIS — N401 Enlarged prostate with lower urinary tract symptoms: Secondary | ICD-10-CM | POA: Diagnosis not present

## 2023-02-22 DIAGNOSIS — N138 Other obstructive and reflux uropathy: Secondary | ICD-10-CM

## 2023-02-22 MED ORDER — SILODOSIN 8 MG PO CAPS: 8.0000 mg | ORAL_CAPSULE | Freq: Every evening | ORAL | 11 refills | Status: DC

## 2023-02-22 NOTE — Progress Notes (Unsigned)
02/22/2023 11:42 AM   [name] 21-Apr-1946 [id number]  Referring provider: Babs Sciara, MD [address] [place],  [place] [address]  No chief complaint on file.   HPI: Nocturia 1-2x. IPSS 4 QOl 1 on rapaflo 8mg  . Uirne stream strong. PSA undetectable.    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   Depression    Elevated liver enzymes    Glaucoma    High triglycerides    History of gout    HIV (human immunodeficiency virus infection) (HCC)    Hypertension    Leukopenia    Low CD4   Multiple sclerosis (HCC)    uses a cane   Myocardial infarction (HCC)    45 years ago   Prostate cancer [facility])     Surgical History: Past Surgical History:  Procedure Laterality Date   BIOPSY  08/14/2016   Procedure: BIOPSY;  Surgeon: West Bali, MD;  [facility];  Service: Endoscopy;;  gastric bx's   CHOLECYSTECTOMY  06/10/2011   Procedure: LAPAROSCOPIC CHOLECYSTECTOMY;  Surgeon: Fabio Bering, MD;  Location: [facility];  Service: General;  Laterality: N/A;   COLONOSCOPY  03/10/2009   AOZ:HYQMVHQI internal hemorrhoids/6-mm sessile ascending colon polyp/tortuous colon, diverticulosis. TA   COLONOSCOPY WITH PROPOFOL N/A 04/19/2015   Dr. Darrick Penna: sessile serrated adenoma, surveillance in 5 years    ESOPHAGOGASTRODUODENOSCOPY  06/23/2008   ONG:EXBMWUX gastritis/schatzki ring   ESOPHAGOGASTRODUODENOSCOPY (EGD) WITH PROPOFOL N/A 08/14/2016   Dr. Darrick Penna: moderate Schatzi's ring s/p dilation, LA Grade A esophagitis,small hiatal hernia, chronic gastritis (negative H.pylori), one duodenal diverticulum   LAPAROSCOPIC APPENDECTOMY N/A 12/13/2017   Procedure: APPENDECTOMY LAPAROSCOPIC;  Surgeon: Franky Macho, MD;  Location: [facility];  Service: General;  Laterality: N/A;   NECK SURGERY  unsure   disc   POLYPECTOMY N/A 04/19/2015   Procedure: POLYPECTOMY;  Surgeon: West Bali, MD;   Location: [facility];  Service: Endoscopy;  Laterality: N/A;  ascending colon   PROSTATE BIOPSY     RADIOACTIVE SEED IMPLANT N/A 11/29/2016   Procedure: RADIOACTIVE SEED IMPLANT/BRACHYTHERAPY IMPLANT;  Surgeon: Marcine Matar, MD;  Location: [facility];  Service: Urology;  Laterality: N/A;  81 seeds implanted   SAVORY DILATION N/A 08/14/2016   Procedure: SAVORY DILATION;  Surgeon: West Bali, MD;  [facility];  Service: Endoscopy;  Laterality: N/A;    Home Medications:  Allergies as of 02/22/2023       Reactions   Pravastatin    myalgias, elevated CK   Yellow Jacket Venom Swelling        Medication List        Accurate as of February 22, 2023 11:42 AM. If you have any questions, ask your nurse or doctor.          acetaminophen 500 MG tablet Commonly known as: TYLENOL Take 500 mg by mouth as needed.   allopurinol 100 MG tablet Commonly known as: ZYLOPRIM TAKE ONE TABLET BY MOUTH EVERY DAY FOR GOUT   amLODipine 5 MG tablet Commonly known as: NORVASC TAKE ONE TABLET BY MOUTH EVERY DAY HIGH BLOOD PRESSURE   Descovy 200-25 MG tablet Generic drug: emtricitabine-tenofovir AF Take 1 tablet by mouth daily.   ezetimibe 10 MG tablet Commonly known as: Zetia Take 1 tablet (10 mg total) by mouth daily.   gabapentin 100 MG capsule Commonly  known as: NEURONTIN Take 1 capsule by mouth 3 (three) times daily.   metFORMIN 500 MG 24 hr tablet Commonly known as: GLUCOPHAGE-XR Take 1 tablet (500 mg total) by mouth 2 (two) times daily with a meal.   pantoprazole 40 MG tablet Commonly known as: PROTONIX TAKE ONE TABLET BY MOUTH EVERY DAY   sildenafil 100 MG tablet Commonly known as: VIAGRA Take 1 tablet (100 mg total) by mouth daily as needed for erectile dysfunction.   silodosin 8 MG Caps capsule Commonly known as: RAPAFLO TAKE ONE CAPSULE BY MOUTH EVERY DAY WITH BREAKFAST   Tivicay 50 MG tablet Generic drug: dolutegravir Take 50 mg by mouth  daily.        Allergies:  Allergies  Allergen Reactions   Pravastatin     myalgias, elevated CK   Yellow Jacket Venom Swelling    Family History: Family History  Problem Relation Age of Onset   Hypertension Mother    Heart attack Mother    Heart attack Father    Cancer Sister        Breast   Diabetes Sister    Diabetes Brother    Cancer Brother        unknown   Cancer Brother        colon   Cancer Sister        breast   Cancer Brother        prostate   Cancer Brother        colon    Social History:  reports that he has never smoked. He has never used smokeless tobacco. He reports that he does not drink alcohol and does not use drugs.  ROS: All other review of systems were reviewed and are negative except what is noted above in HPI  Physical Exam: BP (!) 142/73   Pulse 72   Constitutional:  Alert and oriented, No acute distress. HEENT: [place] AT, moist mucus membranes.  Trachea midline, no masses. Cardiovascular: No clubbing, cyanosis, or edema. Respiratory: Normal respiratory effort, no increased work of breathing. GI: Abdomen is soft, nontender, nondistended, no abdominal masses GU: No CVA tenderness.  Lymph: No cervical or inguinal lymphadenopathy. Skin: No rashes, bruises or suspicious lesions. Neurologic: Grossly intact, no focal deficits, moving all 4 extremities. Psychiatric: Normal mood and affect.  Laboratory Data: Lab Results  Component Value Date   WBC 5.3 01/23/2023   HGB 14.3 01/23/2023   HCT 44.8 01/23/2023   MCV 88.5 01/23/2023   PLT 504 (H) 01/23/2023    Lab Results  Component Value Date   CREATININE 1.06 01/23/2023    Lab Results  Component Value Date   PSA 2.39 04/30/2014   PSA 1.89 04/14/2013    No results found for: "TESTOSTERONE"  Lab Results  Component Value Date   HGBA1C 6.7 (H) 12/17/2022    Urinalysis    Component Value Date/Time   COLORURINE YELLOW 01/16/2023 1526   APPEARANCEUR CLEAR 01/16/2023 1526    APPEARANCEUR Clear 01/17/2022 1547   LABSPEC 1.035 (H) 01/16/2023 1526   PHURINE 5.0 01/16/2023 1526   GLUCOSEU NEGATIVE 01/16/2023 1526   HGBUR SMALL (A) 01/16/2023 1526   BILIRUBINUR NEGATIVE 01/16/2023 1526   BILIRUBINUR Negative 01/17/2022 1547   KETONESUR NEGATIVE 01/16/2023 1526   PROTEINUR 100 (A) 01/16/2023 1526   UROBILINOGEN 0.2 10/28/2012 1444   NITRITE NEGATIVE 01/16/2023 1526   LEUKOCYTESUR NEGATIVE 01/16/2023 1526    Lab Results  Component Value Date   LABMICR 26.1 12/17/2022   WBCUA None  seen 01/17/2022   LABEPIT None seen 01/17/2022   MUCUS Present 12/27/2020   BACTERIA NONE SEEN 01/16/2023    Pertinent Imaging: *** No results found for this or any previous visit.  No results found for this or any previous visit.  No results found for this or any previous visit.  No results found for this or any previous visit.  No results found for this or any previous visit.  No valid procedures specified. No results found for this or any previous visit.  No results found for this or any previous visit.   Assessment & Plan:    1. Prostate cancer (HCC) PSA in 1 year  2. Benign prostatic hyperplasia with urinary obstruction -continue silodosin 8mg  daily  3. Nocturia ***   No follow-ups on file.  Wilkie Aye, MD  [facility] Urology [place]

## 2023-02-26 ENCOUNTER — Encounter: Payer: Self-pay | Admitting: Urology

## 2023-02-26 NOTE — Patient Instructions (Signed)

## 2023-03-05 ENCOUNTER — Ambulatory Visit: Payer: Self-pay | Admitting: Licensed Clinical Social Worker

## 2023-03-05 NOTE — Patient Outreach (Signed)
Care Coordination   Follow Up Visit Note   03/05/2023 Name: Kerry Solis MRN: [id number] DOB: Apr 15, 1946  Kerry Solis is a 77 y.o. year old male who sees Luking, Jonna Coup, MD for primary care. I spoke with  Kerry Solis by phone today.  What matters to the patients health and wellness today? Patient stated he gets sad sometimes due to his inability to do some activities her used to do.     Goals Addressed             This Visit's Progress    Patient Stated he gets sad sometimes due to inability to do activities he used to do       Interventions:  Spoke with client via phone about client status and current needs he faces Spoke with client about family support. He has support of his granddaughter and of his nephews. Client has daughter who lives in [place], [place]. His daughter calls him frequently Client and LCSW spoke of client decreased appetite Spoke of transportation needs. Client said he drives for short trips.  He only drives short distances from his home Spoke of medication procurement Discussed energy level of client. He fatigues occasionally and has to take rest breaks as needed. He has talked with PCP about his decreased energy Discussed vision of client. He wears glasses to help with vision. He thinks that his vision has decreased slightly. He has difficulty reading small print Discussed mood of client. He gets sad occasionally since he cannot do some of the activities he used to do [name] Discussed program support with RN, LCSW, Pharmacist Provided counseling support for client Encouraged client to access program support as needed. Encouraged Onalee Hua to call LCSW as needed for SW support at [phone].           SDOH assessments and interventions completed:  Yes  SDOH Interventions Today    Flowsheet Row Most Recent Value  SDOH Interventions   Depression Interventions/Treatment  Counseling  Physical Activity Interventions  Other (Comments)  [some mobility challenges]  Stress Interventions Provide Counseling  [has stress in managing medical needs]        Care Coordination Interventions:  Yes, provided    Interventions Today    Flowsheet Row Most Recent Value  Chronic Disease   Chronic disease during today's visit Other  [spoke with client about client needs]  General Interventions   General Interventions Discussed/Reviewed General Interventions Discussed, Community Resources  Exercise Interventions   Exercise Discussed/Reviewed Physical Activity  [some mobility issues]  Education Interventions   Education Provided Provided Education  Provided Verbal Education On Community Resources  Mental Health Interventions   Mental Health Discussed/Reviewed Coping Strategies  [client is sometimes sad related to current health needs. He has some mobility challenges]  Nutrition Interventions   Nutrition Discussed/Reviewed Nutrition Discussed  Pharmacy Interventions   Pharmacy Dicussed/Reviewed Pharmacy Topics Discussed  Safety Interventions   Safety Discussed/Reviewed Fall Risk       Follow up plan: Follow up call scheduled for 04/23/23 at 3:00 PM     Encounter Outcome:  Patient Visit Completed   Kelton Pillar.Oriah Leinweber MSW, LCSW Licensed Visual merchandiser [facility] Care Management [phone]

## 2023-03-05 NOTE — Patient Instructions (Signed)
Visit Information  Thank you for taking time to visit with me today. Please don't hesitate to contact me if I can be of assistance to you.   Following are the goals we discussed today:   Goals Addressed             This Visit's Progress    Patient Stated he gets sad sometimes due to inability to do activities he used to do       Interventions:  Spoke with client via phone about client status and current needs he faces Spoke with client about family support. He has support of his granddaughter and of his nephews. Client has daughter who lives in [place], [place]. His daughter calls him frequently Client and LCSW spoke of client decreased appetite Spoke of transportation needs. Client said he drives for short trips.  He only drives short distances from his home Spoke of medication procurement Discussed energy level of client. He fatigues occasionally and has to take rest breaks as needed. He has talked with PCP about his decreased energy Discussed vision of client. He wears glasses to help with vision. He thinks that his vision has decreased slightly. He has difficulty reading small print Discussed mood of client. He gets sad occasionally since he cannot do some of the activities he used to do [name] Discussed program support with RN, LCSW, Pharmacist Provided counseling support for client Encouraged client to access program support as needed. Encouraged Onalee Hua to call LCSW as needed for SW support at [phone].           Our next appointment is by telephone on 04/23/23 at 3:00 PM   Please call the care guide team at [phone] if you need to cancel or reschedule your appointment.   If you are experiencing a Mental Health or Behavioral Health Crisis or need someone to talk to, please go to [facility] [address], [place] [phone])   The patient verbalized understanding of instructions, educational  materials, and care plan provided today and DECLINED offer to receive copy of patient instructions, educational materials, and care plan.   The patient has been provided with contact information for the care management team and has been advised to call with any health related questions or concerns.   Kelton Pillar.Joycie Aerts MSW, LCSW Licensed Visual merchandiser [facility] Care Management [phone]

## 2023-03-20 DIAGNOSIS — Z23 Encounter for immunization: Secondary | ICD-10-CM | POA: Diagnosis not present

## 2023-03-20 DIAGNOSIS — I10 Essential (primary) hypertension: Secondary | ICD-10-CM | POA: Diagnosis not present

## 2023-03-20 DIAGNOSIS — Z79899 Other long term (current) drug therapy: Secondary | ICD-10-CM | POA: Diagnosis not present

## 2023-03-20 DIAGNOSIS — Z792 Long term (current) use of antibiotics: Secondary | ICD-10-CM | POA: Diagnosis not present

## 2023-04-05 ENCOUNTER — Other Ambulatory Visit: Payer: Self-pay

## 2023-04-05 ENCOUNTER — Emergency Department (HOSPITAL_COMMUNITY): Payer: Medicare Other

## 2023-04-05 ENCOUNTER — Encounter (HOSPITAL_COMMUNITY): Payer: Self-pay | Admitting: *Deleted

## 2023-04-05 ENCOUNTER — Inpatient Hospital Stay (HOSPITAL_COMMUNITY)
Admission: EM | Admit: 2023-04-05 | Discharge: 2023-04-07 | DRG: 975 | Disposition: A | Discharge status: Home / Self Care | Payer: Medicare Other | Attending: Family Medicine | Admitting: Family Medicine

## 2023-04-05 DIAGNOSIS — E1165 Type 2 diabetes mellitus with hyperglycemia: Secondary | ICD-10-CM | POA: Diagnosis not present

## 2023-04-05 DIAGNOSIS — H6690 Otitis media, unspecified, unspecified ear: Secondary | ICD-10-CM | POA: Diagnosis present

## 2023-04-05 DIAGNOSIS — H409 Unspecified glaucoma: Secondary | ICD-10-CM | POA: Diagnosis present

## 2023-04-05 DIAGNOSIS — B20 Human immunodeficiency virus [HIV] disease: Secondary | ICD-10-CM | POA: Diagnosis present

## 2023-04-05 DIAGNOSIS — Z9049 Acquired absence of other specified parts of digestive tract: Secondary | ICD-10-CM | POA: Diagnosis not present

## 2023-04-05 DIAGNOSIS — Z888 Allergy status to other drugs, medicaments and biological substances status: Secondary | ICD-10-CM

## 2023-04-05 DIAGNOSIS — N400 Enlarged prostate without lower urinary tract symptoms: Secondary | ICD-10-CM | POA: Insufficient documentation

## 2023-04-05 DIAGNOSIS — Z1152 Encounter for screening for COVID-19: Secondary | ICD-10-CM | POA: Diagnosis not present

## 2023-04-05 DIAGNOSIS — G35 Multiple sclerosis: Secondary | ICD-10-CM | POA: Diagnosis not present

## 2023-04-05 DIAGNOSIS — I10 Essential (primary) hypertension: Secondary | ICD-10-CM | POA: Diagnosis not present

## 2023-04-05 DIAGNOSIS — Z833 Family history of diabetes mellitus: Secondary | ICD-10-CM

## 2023-04-05 DIAGNOSIS — Z8249 Family history of ischemic heart disease and other diseases of the circulatory system: Secondary | ICD-10-CM

## 2023-04-05 DIAGNOSIS — H9201 Otalgia, right ear: Secondary | ICD-10-CM | POA: Diagnosis not present

## 2023-04-05 DIAGNOSIS — H7090 Unspecified mastoiditis, unspecified ear: Secondary | ICD-10-CM | POA: Insufficient documentation

## 2023-04-05 DIAGNOSIS — H6501 Acute serous otitis media, right ear: Secondary | ICD-10-CM | POA: Diagnosis not present

## 2023-04-05 DIAGNOSIS — E782 Mixed hyperlipidemia: Secondary | ICD-10-CM | POA: Diagnosis present

## 2023-04-05 DIAGNOSIS — H70001 Acute mastoiditis without complications, right ear: Secondary | ICD-10-CM | POA: Diagnosis not present

## 2023-04-05 DIAGNOSIS — Z8042 Family history of malignant neoplasm of prostate: Secondary | ICD-10-CM

## 2023-04-05 DIAGNOSIS — Z8546 Personal history of malignant neoplasm of prostate: Secondary | ICD-10-CM | POA: Diagnosis not present

## 2023-04-05 DIAGNOSIS — M1990 Unspecified osteoarthritis, unspecified site: Secondary | ICD-10-CM | POA: Diagnosis present

## 2023-04-05 DIAGNOSIS — K219 Gastro-esophageal reflux disease without esophagitis: Secondary | ICD-10-CM | POA: Diagnosis not present

## 2023-04-05 DIAGNOSIS — Z21 Asymptomatic human immunodeficiency virus [HIV] infection status: Secondary | ICD-10-CM | POA: Diagnosis not present

## 2023-04-05 DIAGNOSIS — I252 Old myocardial infarction: Secondary | ICD-10-CM

## 2023-04-05 DIAGNOSIS — H6691 Otitis media, unspecified, right ear: Secondary | ICD-10-CM | POA: Diagnosis not present

## 2023-04-05 DIAGNOSIS — Z803 Family history of malignant neoplasm of breast: Secondary | ICD-10-CM

## 2023-04-05 DIAGNOSIS — R519 Headache, unspecified: Secondary | ICD-10-CM | POA: Diagnosis not present

## 2023-04-05 DIAGNOSIS — Z860100 Personal history of colon polyps, unspecified: Secondary | ICD-10-CM

## 2023-04-05 DIAGNOSIS — K2100 Gastro-esophageal reflux disease with esophagitis, without bleeding: Secondary | ICD-10-CM | POA: Diagnosis present

## 2023-04-05 DIAGNOSIS — A419 Sepsis, unspecified organism: Principal | ICD-10-CM | POA: Diagnosis present

## 2023-04-05 DIAGNOSIS — Z79899 Other long term (current) drug therapy: Secondary | ICD-10-CM

## 2023-04-05 DIAGNOSIS — J341 Cyst and mucocele of nose and nasal sinus: Secondary | ICD-10-CM | POA: Diagnosis not present

## 2023-04-05 DIAGNOSIS — J40 Bronchitis, not specified as acute or chronic: Secondary | ICD-10-CM | POA: Diagnosis not present

## 2023-04-05 DIAGNOSIS — Z800 Family history of malignant neoplasm of digestive organs: Secondary | ICD-10-CM

## 2023-04-05 DIAGNOSIS — K222 Esophageal obstruction: Secondary | ICD-10-CM

## 2023-04-05 DIAGNOSIS — R000 Tachycardia, unspecified: Secondary | ICD-10-CM | POA: Diagnosis not present

## 2023-04-05 DIAGNOSIS — Z7984 Long term (current) use of oral hypoglycemic drugs: Secondary | ICD-10-CM

## 2023-04-05 DIAGNOSIS — Z91038 Other insect allergy status: Secondary | ICD-10-CM | POA: Diagnosis not present

## 2023-04-05 DIAGNOSIS — I6782 Cerebral ischemia: Secondary | ICD-10-CM | POA: Diagnosis not present

## 2023-04-05 LAB — CBC WITH DIFFERENTIAL/PLATELET
Abs Immature Granulocytes: 0 10*3/uL (ref 0.00–0.07)
Basophils Absolute: 0 10*3/uL (ref 0.0–0.1)
Basophils Relative: 0 %
Eosinophils Absolute: 0.1 10*3/uL (ref 0.0–0.5)
Eosinophils Relative: 1 %
HCT: 44.4 % (ref 39.0–52.0)
Hemoglobin: 14.2 g/dL (ref 13.0–17.0)
Lymphocytes Relative: 24 %
Lymphs Abs: 2.2 10*3/uL (ref 0.7–4.0)
MCH: 28.4 pg (ref 26.0–34.0)
MCHC: 32 g/dL (ref 30.0–36.0)
MCV: 88.8 fL (ref 80.0–100.0)
Monocytes Absolute: 0.5 10*3/uL (ref 0.1–1.0)
Monocytes Relative: 5 %
Neutro Abs: 6.4 10*3/uL (ref 1.7–7.7)
Neutrophils Relative %: 70 %
Platelets: 266 10*3/uL (ref 150–400)
RBC: 5 MIL/uL (ref 4.22–5.81)
RDW: 15.6 % — ABNORMAL HIGH (ref 11.5–15.5)
WBC: 9.1 10*3/uL (ref 4.0–10.5)
nRBC: 0 % (ref 0.0–0.2)

## 2023-04-05 LAB — LACTIC ACID, PLASMA
Lactic Acid, Venous: 1.5 mmol/L (ref 0.5–1.9)
Lactic Acid, Venous: 1 mmol/L (ref 0.5–1.9)

## 2023-04-05 LAB — RESP PANEL BY RT-PCR (RSV, FLU A&B, COVID)  RVPGX2
Influenza A by PCR: NEGATIVE
Influenza B by PCR: NEGATIVE
Resp Syncytial Virus by PCR: NEGATIVE
SARS Coronavirus 2 by RT PCR: NEGATIVE

## 2023-04-05 LAB — COMPREHENSIVE METABOLIC PANEL
ALT: 25 U/L (ref 0–44)
AST: 26 U/L (ref 15–41)
Albumin: 4.5 g/dL (ref 3.5–5.0)
Alkaline Phosphatase: 75 U/L (ref 38–126)
Anion gap: 11 (ref 5–15)
BUN: 12 mg/dL (ref 8–23)
CO2: 22 mmol/L (ref 22–32)
Calcium: 9.1 mg/dL (ref 8.9–10.3)
Chloride: 104 mmol/L (ref 98–111)
Creatinine, Ser: 1.1 mg/dL (ref 0.61–1.24)
GFR, Estimated: >60 mL/min (ref >60)
Glucose, Bld: 162 mg/dL — ABNORMAL HIGH (ref 70–99)
Potassium: 3.8 mmol/L (ref 3.5–5.1)
Sodium: 137 mmol/L (ref 135–145)
Total Bilirubin: 0.6 mg/dL (ref 0.3–1.2)
Total Protein: 7.8 g/dL (ref 6.5–8.1)

## 2023-04-05 LAB — URINALYSIS, W/ REFLEX TO CULTURE (INFECTION SUSPECTED)
Bacteria, UA: NONE SEEN
Bilirubin Urine: NEGATIVE
Glucose, UA: NEGATIVE mg/dL
Ketones, ur: NEGATIVE mg/dL
Leukocytes,Ua: NEGATIVE
Nitrite: NEGATIVE
Protein, ur: 100 mg/dL — AB
Specific Gravity, Urine: 1.011 (ref 1.005–1.030)
pH: 6 (ref 5.0–8.0)

## 2023-04-05 LAB — APTT: aPTT: 30 s (ref 24–36)

## 2023-04-05 LAB — PROTIME-INR
INR: 1 (ref 0.8–1.2)
Prothrombin Time: 13.1 s (ref 11.4–15.2)

## 2023-04-05 MED ORDER — ENOXAPARIN SODIUM 40 MG/0.4ML IJ SOSY
40.0000 mg | PREFILLED_SYRINGE | INTRAMUSCULAR | Status: DC
  Administered 2023-04-06 – 2023-04-07 (×2): 40 mg via SUBCUTANEOUS
  Filled 2023-04-05 (×2): qty 0.4

## 2023-04-05 MED ORDER — ACETAMINOPHEN 325 MG PO TABS
650.0000 mg | ORAL_TABLET | Freq: Four times a day (QID) | ORAL | Status: DC | PRN
  Administered 2023-04-06 – 2023-04-07 (×4): 650 mg via ORAL
  Filled 2023-04-05 (×4): qty 2

## 2023-04-05 MED ORDER — PROCHLORPERAZINE EDISYLATE 10 MG/2ML IJ SOLN: 10.0000 mg | Freq: Four times a day (QID) | INTRAMUSCULAR | Status: DC | PRN

## 2023-04-05 MED ORDER — DOLUTEGRAVIR SODIUM 50 MG PO TABS
50.0000 mg | ORAL_TABLET | Freq: Every day | ORAL | Status: DC
  Filled 2023-04-05 (×3): qty 1

## 2023-04-05 MED ORDER — HYDROCODONE-ACETAMINOPHEN 5-325 MG PO TABS
1.0000 | ORAL_TABLET | Freq: Once | ORAL | Status: AC
  Administered 2023-04-05: 1 via ORAL
  Filled 2023-04-05: qty 1

## 2023-04-05 MED ORDER — IOHEXOL 300 MG/ML  SOLN
75.0000 mL | Freq: Once | INTRAMUSCULAR | Status: AC | PRN
  Administered 2023-04-05: 75 mL via INTRAVENOUS

## 2023-04-05 MED ORDER — SODIUM CHLORIDE 0.9 % IV SOLN
2.0000 g | Freq: Once | INTRAVENOUS | Status: AC
  Administered 2023-04-05: 2 g via INTRAVENOUS
  Filled 2023-04-05: qty 20

## 2023-04-05 MED ORDER — EMTRICITABINE-TENOFOVIR AF 200-25 MG PO TABS
1.0000 | ORAL_TABLET | Freq: Every day | ORAL | Status: DC
  Administered 2023-04-06 – 2023-04-07 (×2): 1 via ORAL
  Filled 2023-04-05 (×3): qty 1

## 2023-04-05 MED ORDER — SODIUM CHLORIDE 0.9 % IV SOLN
1.0000 g | INTRAVENOUS | Status: DC
  Filled 2023-04-05: qty 10

## 2023-04-05 MED ORDER — ACETAMINOPHEN 325 MG PO TABS
650.0000 mg | ORAL_TABLET | Freq: Once | ORAL | Status: AC
  Administered 2023-04-05: 650 mg via ORAL
  Filled 2023-04-05: qty 2

## 2023-04-05 MED ORDER — ACETAMINOPHEN 650 MG RE SUPP: 650.0000 mg | Freq: Four times a day (QID) | RECTAL | Status: DC | PRN

## 2023-04-05 MED ORDER — FLUTICASONE PROPIONATE 50 MCG/ACT NA SUSP: 2.0000 | Freq: Every day | NASAL | 2 refills | Status: DC

## 2023-04-05 MED ORDER — KETOROLAC TROMETHAMINE 15 MG/ML IJ SOLN
15.0000 mg | Freq: Once | INTRAMUSCULAR | Status: AC
  Administered 2023-04-05: 15 mg via INTRAVENOUS
  Filled 2023-04-05: qty 1

## 2023-04-05 MED ORDER — EZETIMIBE 10 MG PO TABS
10.0000 mg | ORAL_TABLET | Freq: Every day | ORAL | Status: DC
  Administered 2023-04-06 – 2023-04-07 (×2): 10 mg via ORAL
  Filled 2023-04-05 (×2): qty 1

## 2023-04-05 MED ORDER — TAMSULOSIN HCL 0.4 MG PO CAPS
0.4000 mg | ORAL_CAPSULE | Freq: Every day | ORAL | Status: DC
  Administered 2023-04-06: 0.4 mg via ORAL
  Filled 2023-04-05: qty 1

## 2023-04-05 MED ORDER — AMLODIPINE BESYLATE 5 MG PO TABS
5.0000 mg | ORAL_TABLET | Freq: Every day | ORAL | Status: DC
  Administered 2023-04-06 – 2023-04-07 (×2): 5 mg via ORAL
  Filled 2023-04-05 (×2): qty 1

## 2023-04-05 MED ORDER — PANTOPRAZOLE SODIUM 40 MG PO TBEC
40.0000 mg | DELAYED_RELEASE_TABLET | Freq: Every day | ORAL | Status: DC
  Administered 2023-04-06 – 2023-04-07 (×2): 40 mg via ORAL
  Filled 2023-04-05 (×2): qty 1

## 2023-04-05 MED ORDER — AMOXICILLIN-POT CLAVULANATE 875-125 MG PO TABS
1.0000 | ORAL_TABLET | Freq: Once | ORAL | Status: DC
  Filled 2023-04-05: qty 1

## 2023-04-05 NOTE — Sepsis Progress Note (Signed)
eLink is following this Code Sepsis. °

## 2023-04-05 NOTE — Progress Notes (Signed)
ENT Progress Note   Spoke with staff and Carmel [place] PA re 89 yoM with hx of HIV who p/w R ear pain x 2 weeks, and CT T-bone with evidence of otitis media and mastoid air cell effusion without coalescent changes.   CT T-bone reviewed  IMPRESSION: 1. Fluid in the right mastoid air cells and right middle ear cavity, without osseous erosion, concerning for otitis media and uncomplicated mastoiditis. 2. Normal left temporal bone. 3. Sequela of chronic right sinusitis.  I personally reviewed imaging and there is very scant effusion along the right mastoid air cells and evidence of mucous retention cyst in the right maxillary sinus.  No evidence of bony erosion within the mastoid air cells to suspect mastoiditis, no abscess.  Recommend medical management of otitis media with antibiotic and possibly steroids, analgesia, and outpatient follow-up with ENT in 1 to 2 weeks at [address].   Ashok Croon

## 2023-04-05 NOTE — ED Notes (Signed)
Attempted to call report with no answer

## 2023-04-05 NOTE — ED Notes (Signed)
MD aware of temp 

## 2023-04-05 NOTE — ED Provider Notes (Signed)
[place] EMERGENCY DEPARTMENT AT [facility] Provider Note   CSN: [id number] Arrival date & time: 04/05/23  1514     History  Chief Complaint  Patient presents with   Otalgia    Kerry Solis is a 77 y.o. male.  He has PMH of HIV on antiretroviral therapy presents the ER for right ear pain for the past couple weeks worse in the past couple of days, no fever chills or drainage.  Denies trauma.  States he does get a little bit off of balance when he turns his head to the left or right.  Denies drainage from the ear.  Denies numbness tingling weakness or syncope.  He states he has balance issues at baseline but has been worse since the pain got worse.   Otalgia      Home Medications Prior to Admission medications   Medication Sig Start Date End Date Taking? Authorizing Provider  acetaminophen (TYLENOL) 500 MG tablet Take 500 mg by mouth as needed. Patient not taking: Reported on 02/22/2023    [provider]  allopurinol (ZYLOPRIM) 100 MG tablet TAKE ONE TABLET BY MOUTH EVERY DAY FOR GOUT 10/23/22   Babs Sciara, MD  amLODipine (NORVASC) 5 MG tablet TAKE ONE TABLET BY MOUTH EVERY DAY HIGH BLOOD PRESSURE 10/23/22   Babs Sciara, MD  DESCOVY 200-25 MG tablet Take 1 tablet by mouth daily. Patient not taking: Reported on 02/22/2023 03/06/21   [provider]  ezetimibe (ZETIA) 10 MG tablet Take 1 tablet (10 mg total) by mouth daily. 10/23/22   Babs Sciara, MD  gabapentin (NEURONTIN) 100 MG capsule Take 1 capsule by mouth 3 (three) times daily. 04/25/22   [provider]  metFORMIN (GLUCOPHAGE-XR) 500 MG 24 hr tablet Take 1 tablet (500 mg total) by mouth 2 (two) times daily with a meal. 10/23/22   Gerda Diss, Jonna Coup, MD  pantoprazole (PROTONIX) 40 MG tablet TAKE ONE TABLET BY MOUTH EVERY DAY 10/23/22   Babs Sciara, MD  sildenafil (VIAGRA) 100 MG tablet Take 1 tablet (100 mg total) by mouth daily as needed for erectile dysfunction. Patient not taking:  Reported on 02/22/2023 01/17/22   Malen Gauze, MD  silodosin (RAPAFLO) 8 MG CAPS capsule Take 1 capsule (8 mg total) by mouth at bedtime. 02/22/23   McKenzie, Mardene Amar Sippel, MD  TIVICAY 50 MG tablet Take 50 mg by mouth daily. Patient not taking: Reported on 02/22/2023 03/06/21   [provider]      Allergies    Pravastatin and Yellow jacket venom    Review of Systems   Review of Systems  HENT:  Positive for ear pain.     Physical Exam Updated Vital Signs BP (!) 176/88 (BP Location: Right Arm)   Pulse (!) 111   Temp 99.8 F (37.7 C) (Oral)   Resp 18   Ht 5\' 9"  (1.753 m)   Wt 90.7 kg   SpO2 94%   BMI 29.53 kg/m  Physical Exam Vitals and nursing note reviewed.  Constitutional:      General: He is not in acute distress.    Appearance: He is well-developed.  HENT:     Head: Normocephalic and atraumatic.     Right Ear: Tympanic membrane normal.     Left Ear: A middle ear effusion is present. Tympanic membrane is injected.     Mouth/Throat:     Mouth: Mucous membranes are moist.  Eyes:     Conjunctiva/sclera: Conjunctivae normal.  Cardiovascular:     Rate and Rhythm: Normal rate and regular rhythm.     Heart sounds: No murmur heard. Pulmonary:     Effort: Pulmonary effort is normal. No respiratory distress.     Breath sounds: Normal breath sounds.  Abdominal:     Palpations: Abdomen is soft.     Tenderness: There is no abdominal tenderness.  Musculoskeletal:        General: No swelling.     Cervical back: Neck supple.  Skin:    General: Skin is warm and dry.     Capillary Refill: Capillary refill takes less than 2 seconds.  Neurological:     General: No focal deficit present.     Mental Status: He is alert and oriented to person, place, and time.  Psychiatric:        Mood and Affect: Mood normal.     ED Results / Procedures / Treatments   Labs (all labs ordered are listed, but only abnormal results are displayed) Labs Reviewed  CBC WITH  DIFFERENTIAL/PLATELET - Abnormal; Notable for the following components:      Result Value   RDW 15.6 (*)    All other components within normal limits  COMPREHENSIVE METABOLIC PANEL - Abnormal; Notable for the following components:   Glucose, Bld 162 (*)    All other components within normal limits    EKG EKG Interpretation Date/Time:  Friday April 05 2023 15:37:09 EDT Ventricular Rate:  117 PR Interval:  200 QRS Duration:  86 QT Interval:  356 QTC Calculation: 496 R Axis:   -58  Text Interpretation: Sinus tachycardia Left anterior fascicular block Nonspecific T wave abnormality Abnormal ECG When compared with ECG of 16-Jan-2023 13:11, PREVIOUS ECG IS PRESENT Since last tracing rate faster Confirmed by Eber Hong ([id number]) on 04/05/2023 3:50:04 PM  Radiology No results found.  Procedures .Critical Care  Performed by: Ma Rings, PA-C Authorized by: Ma Rings, PA-C   Critical care provider statement:    Critical care time (minutes):  30   Critical care was necessary to treat or prevent imminent or life-threatening deterioration of the following conditions:  Sepsis   Critical care was time spent personally by me on the following activities:  Development of treatment plan with patient or surrogate, discussions with consultants, evaluation of patient's response to treatment, examination of patient, ordering and review of laboratory studies, ordering and review of radiographic studies, ordering and performing treatments and interventions, pulse oximetry, re-evaluation of patient's condition, review of old charts and obtaining history from patient or surrogate   Care discussed with comment:  Consulted with ENT and hospitalist     Medications Ordered in ED Medications  HYDROcodone-acetaminophen (NORCO/VICODIN) 5-325 MG per tablet 1 tablet (1 tablet Oral Given 04/05/23 1647)    ED Course/ Medical Decision Making/ A&P Clinical Course as of 04/05/23 2130  Fri Apr 05, 2023  1743 Presenting with right ear pain over the past 4 weeks gotten worse past several days.  Be tachycardic but labs reassuring initial plan for recheck heart rate after pain meds that recheck temperature on further show patient had significant fever and he does have immunosuppression due to HIV.  At this point we broaden our workup significantly, concern for possible mastoiditis given his significant pain versus meningitis versus possible pneumonia as oxygen saturations in the low 90s although less likely due to lack of cough or shortness of breath [CB]  2127 CT shows ear infection with uncomplicated mastoiditis.  I consulted  with the ENT and spoke with Dr. Fenton Foy.  States patient is being medically managed with antibiotics for ear infection, since there is no fluid in the ear canal does not need eardrops.  Since patient is systemically ill plan is to admit him since he is immune compromised, she states patient can follow-up with ENT as an outpatient though if he would ultimately need to go to [facility] they could consult as inpatient if needed. [CB]    Clinical Course User Index [CB] Ma Rings, PA-C                                 Medical Decision Making Ddx: Media, otitis externa, mastoiditis, trigeminal neuralgia, meningitis, other  Labs: Normal lactic acid, no leukocytosis, proteinuria but no UTI, negative COVID flu RSV, CMP normal.  ED course: Patient presented to the ER for several weeks of right ear pain has been worse for the past several days.  Initial plan was to treat for otitis media but noted on repeat check reference temperature that his temperature was 102 Fahrenheit.  Mildly tachycardic as well so given his immune suppression broaden our workup including CT temporal bones to rule out mastoiditis.  Labs are otherwise normal, he does have otitis media with uncomplicated mastoiditis on his CT scan of his temporal bones with contrast.  CT head also ordered without  contrast no acute intracranial hemorrhage, I viewed and interpreted these images myself, agree with radiology read.  Given the findings of otitis media with mastoiditis I consulted ENT and spoke with Dr. Irene Pap.  From her standpoint patient can be treated with antibiotics and follow-up in clinic.  I discussed with her that patient is systemically ill and given his immunosuppression plan is to admit him to the hospital for IV antibiotics.  She states if ENT is not needed throughout the stay he can still follow-up as an outpatient.  If he is ultimately transferred to [facility] they can see him in consult if needed.   Does meet sepsis criteria but did not need IV fluid bolus, normal lactic acid and normal blood pressures.  He does not meet criteria for severe sepsis or septic shock.   Patient was given Tylenol with relief of his tachycardia and fever.  Had mild improvement of his pain with Norco.  Amount and/or Complexity of Data Reviewed Labs: ordered. Radiology: ordered.  Risk OTC drugs. Prescription drug management. Decision regarding hospitalization.           Final Clinical Impression(s) / ED Diagnoses Final diagnoses:  None    Rx / DC Orders ED Discharge Orders     None         Josem Kaufmann 04/05/23 2206    Eber Hong, MD 04/06/23 1149

## 2023-04-05 NOTE — ED Notes (Signed)
Report given to sherry.

## 2023-04-05 NOTE — H&P (Signed)
History and Physical    Patient: Kerry Solis [id number] DOB: 12-Apr-1946 DOA: 04/05/2023 DOS: the patient was seen and examined on 04/05/2023 PCP: Babs Sciara, MD  Patient coming from: Home  Chief Complaint:  Chief Complaint  Patient presents with   Otalgia   HPI: NAWEED DOLLARHIDE is a 77 y.o. male with medical history significant of HIV who presents to the emergency department due to 2-week onset of right ear pain which worsened within the last few days and progressively got worse last night.  Patient complained of worsening balance from baseline within the last few days.  He denies fever, headache, drainage from the ear, chills.  He presents to the ED for further evaluation and management.  ED Course:  In the emergency department, he was tachycardic, BP was 176/88, other vital signs were within normal range.  Workup in the ED showed normal CBC and BMP except for blood glucose of 162 urinalysis was normal, lactic acid was normal.  Influenza A, B, SARS, respiratory, RSV was negative.  Blood culture was pending. CT temporal bones with contrast showed fluid in the right mastoid air cells and right middle ear cavity, without osseous erosion, concerning for otitis media and uncomplicated mastoiditis.  Normal left temporal bone.  Sequela of chronic right sinusitis. CT head without contrast showed no CT evidence for acute or acute abnormality Chest x-ray showed low lung volumes with mild bronchitis changes ENT specialist (Dr. Irene Pap, [facility] Friendly)  was consulted and recommended medical management of otitis media with antibiotic and possibly steroids, analgesia and outpatient follow-up with ENT in 1 to 2 weeks He was started on IV ceftriaxone, IV Toradol 15 mg x 1 was given, Tylenol was given due to fever. Hospitalist was asked to admit patient for further evaluation and management.  Review of Systems: Review of systems as noted in the HPI. All other systems reviewed and are  negative.   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   Depression    Elevated liver enzymes    Glaucoma    High triglycerides    History of gout    HIV (human immunodeficiency virus infection) (HCC)    Hypertension    Leukopenia    Low CD4   Multiple sclerosis (HCC)    uses a cane   Myocardial infarction (HCC)    45 years ago   Prostate cancer [facility])    Past Surgical History:  Procedure Laterality Date   BIOPSY  08/14/2016   Procedure: BIOPSY;  Surgeon: West Bali, MD;  Location: [facility];  Service: Endoscopy;;  gastric bx's   CHOLECYSTECTOMY  06/10/2011   Procedure: LAPAROSCOPIC CHOLECYSTECTOMY;  Surgeon: Fabio Bering, MD;  Location: [facility] ORS;  Service: General;  Laterality: N/A;   COLONOSCOPY  03/10/2009   [id number] internal hemorrhoids/6-mm sessile ascending colon polyp/tortuous colon, diverticulosis. TA   COLONOSCOPY WITH PROPOFOL N/A 04/19/2015   Dr. Darrick Penna: sessile serrated adenoma, surveillance in 5 years    ESOPHAGOGASTRODUODENOSCOPY  06/23/2008   KVQ:QVZDGLO gastritis/schatzki ring   ESOPHAGOGASTRODUODENOSCOPY (EGD) WITH PROPOFOL N/A 08/14/2016   Dr. Darrick Penna: moderate Schatzi's ring s/p dilation, LA Grade A esophagitis,small hiatal hernia, chronic gastritis (negative H.pylori), one duodenal diverticulum   LAPAROSCOPIC APPENDECTOMY N/A 12/13/2017   Procedure: APPENDECTOMY LAPAROSCOPIC;  Surgeon: Franky Macho, MD;  Location: [facility] ORS;  Service: General;  Laterality: N/A;   NECK SURGERY  unsure  disc   POLYPECTOMY N/A 04/19/2015   Procedure: POLYPECTOMY;  Surgeon: West Bali, MD;  Location: [facility] ORS;  Service: Endoscopy;  Laterality: N/A;  ascending colon   PROSTATE BIOPSY     RADIOACTIVE SEED IMPLANT N/A 11/29/2016   Procedure: RADIOACTIVE SEED IMPLANT/BRACHYTHERAPY IMPLANT;  Surgeon: Marcine Matar, MD;  Location: [facility];  Service: Urology;   Laterality: N/A;  81 seeds implanted   SAVORY DILATION N/A 08/14/2016   Procedure: SAVORY DILATION;  Surgeon: West Bali, MD;  Location: [facility];  Service: Endoscopy;  Laterality: N/A;    Social History:  reports that he has never smoked. He has never used smokeless tobacco. He reports that he does not drink alcohol and does not use drugs.   Allergies  Allergen Reactions   Pravastatin     myalgias, elevated CK   Yellow Jacket Venom Swelling    Family History  Problem Relation Age of Onset   Hypertension Mother    Heart attack Mother    Heart attack Father    Cancer Sister        Breast   Diabetes Sister    Diabetes Brother    Cancer Brother        unknown   Cancer Brother        colon   Cancer Sister        breast   Cancer Brother        prostate   Cancer Brother        colon    ***  Prior to Admission medications   Medication Sig Start Date End Date Taking? Authorizing Provider  fluticasone (FLONASE) 50 MCG/ACT nasal spray Place 2 sprays into both nostrils daily. 04/05/23  Yes Beatty, Celeste A, PA-C  acetaminophen (TYLENOL) 500 MG tablet Take 500 mg by mouth as needed. Patient not taking: Reported on 02/22/2023    [provider]  allopurinol (ZYLOPRIM) 100 MG tablet TAKE ONE TABLET BY MOUTH EVERY DAY FOR GOUT 10/23/22   Babs Sciara, MD  amLODipine (NORVASC) 5 MG tablet TAKE ONE TABLET BY MOUTH EVERY DAY HIGH BLOOD PRESSURE 10/23/22   Babs Sciara, MD  DESCOVY 200-25 MG tablet Take 1 tablet by mouth daily. Patient not taking: Reported on 02/22/2023 03/06/21   [provider]  ezetimibe (ZETIA) 10 MG tablet Take 1 tablet (10 mg total) by mouth daily. 10/23/22   Babs Sciara, MD  gabapentin (NEURONTIN) 100 MG capsule Take 1 capsule by mouth 3 (three) times daily. 04/25/22   [provider]  metFORMIN (GLUCOPHAGE-XR) 500 MG 24 hr tablet Take 1 tablet (500 mg total) by mouth 2 (two) times daily with a meal. 10/23/22   Gerda Diss, Jonna Coup, MD   pantoprazole (PROTONIX) 40 MG tablet TAKE ONE TABLET BY MOUTH EVERY DAY 10/23/22   Babs Sciara, MD  sildenafil (VIAGRA) 100 MG tablet Take 1 tablet (100 mg total) by mouth daily as needed for erectile dysfunction. Patient not taking: Reported on 02/22/2023 01/17/22   Malen Gauze, MD  silodosin (RAPAFLO) 8 MG CAPS capsule Take 1 capsule (8 mg total) by mouth at bedtime. 02/22/23   McKenzie, Mardene Celeste, MD  TIVICAY 50 MG tablet Take 50 mg by mouth daily. Patient not taking: Reported on 02/22/2023 03/06/21   [provider]    Physical Exam: BP (!) 148/80   Pulse 85   Temp 99 F (37.2 C)   Resp 20   Ht 5\' 9"  (1.753 m)  Wt 90.7 kg   SpO2 95%   BMI 29.53 kg/m   General: 77 y.o. year-old male well developed well nourished in no acute distress.  Alert and oriented x3. HEENT: NCAT, EOMI Neck: Supple, trachea medial Cardiovascular: Regular rate and rhythm with no rubs or gallops.  No thyromegaly or JVD noted.  No lower extremity edema. 2/4 pulses in all 4 extremities. Respiratory: Clear to auscultation with no wheezes or rales. Good inspiratory effort. Abdomen: Soft, nontender nondistended with normal bowel sounds x4 quadrants. Muskuloskeletal: No cyanosis, clubbing or edema noted bilaterally Neuro: CN II-XII intact, strength 5/5 x 4, sensation, reflexes intact Skin: No ulcerative lesions noted or rashes Psychiatry: Judgement and insight appear normal. Mood is appropriate for condition and setting          Labs on Admission:  Basic Metabolic Panel: Recent Labs  Lab 04/05/23 1624  NA 137  K 3.8  CL 104  CO2 22  GLUCOSE 162*  BUN 12  CREATININE 1.10  CALCIUM 9.1   Liver Function Tests: Recent Labs  Lab 04/05/23 1624  AST 26  ALT 25  ALKPHOS 75  BILITOT 0.6  PROT 7.8  ALBUMIN 4.5   No results for input(s): "LIPASE", "AMYLASE" in the last 168 hours. No results for input(s): "AMMONIA" in the last 168 hours. CBC: Recent Labs  Lab 04/05/23 1624  WBC 9.1   NEUTROABS 6.4  HGB 14.2  HCT 44.4  MCV 88.8  PLT 266   Cardiac Enzymes: No results for input(s): "CKTOTAL", "CKMB", "CKMBINDEX", "TROPONINI" in the last 168 hours.  BNP (last 3 results) No results for input(s): "BNP" in the last 8760 hours.  ProBNP (last 3 results) No results for input(s): "PROBNP" in the last 8760 hours.  CBG: No results for input(s): "GLUCAP" in the last 168 hours.  Radiological Exams on Admission: CT Temporal Bones W Contrast  Result Date: 04/05/2023 CLINICAL DATA:  Right ear ache for 3 or 4 weeks EXAM: CT TEMPORAL BONES WITH CONTRAST TECHNIQUE: Axial and coronal plane CT imaging of the petrous temporal bones was performed with thin-collimation image reconstruction following intravenous contrast administration. Multiplanar CT image reconstructions were also generated. RADIATION DOSE REDUCTION: This exam was performed according to the departmental dose-optimization program which includes automated exposure control, adjustment of the mA and/or kV according to patient size and/or use of iterative reconstruction technique. CONTRAST:  75mL OMNIPAQUE IOHEXOL 300 MG/ML  SOLN COMPARISON:  07/15/2020 CT head FINDINGS: RIGHT TEMPORAL BONE External auditory canal: Normal. Middle ear cavity: Fluid in the dependent portion of the middle ear, particularly in the hypo tympanum. The scutum and ossicles appear intact. The tegmen tympani is intact. Inner ear structures: The cochlea, vestibule and semicircular canals are normal. The vestibular aqueduct is not enlarged. Internal auditory and facial nerve canals:  Normal Mastoid air cells: Fluid in the right mastoid air cells. No osseous erosion. LEFT TEMPORAL BONE External auditory canal: Normal. Middle ear cavity: Normally aerated. The scutum and ossicles are normal. The tegmen tympani is intact. Inner ear structures: The cochlea, vestibule and semicircular canals are normal. The vestibular aqueduct is not enlarged. Internal auditory and  facial nerve canals:  Normal. Mastoid air cells: Normally aerated. No osseous erosion. Vascular: Normal appearance of the carotid canal, jugular bulbs, and sigmoid plates. Limited intracranial:  No acute or significant finding. Visible orbits/paranasal sinuses: Sequela of chronic right sinusitis. Mucous retention cyst in the right maxillary sinus. Mucosal thickening in the left maxillary sinus. Mucosal thickening in the ethmoid air cells and  right frontal sinus. No acute finding in the imaged orbits. Soft tissues: Normal. IMPRESSION: 1. Fluid in the right mastoid air cells and right middle ear cavity, without osseous erosion, concerning for otitis media and uncomplicated mastoiditis. 2. Normal left temporal bone. 3. Sequela of chronic right sinusitis. Electronically Signed   By: Wiliam Ke M.D.   On: 04/05/2023 19:29   CT Head Wo Contrast  Result Date: 04/05/2023 CLINICAL DATA:  Headache EXAM: CT HEAD WITHOUT CONTRAST TECHNIQUE: Contiguous axial images were obtained from the base of the skull through the vertex without intravenous contrast. RADIATION DOSE REDUCTION: This exam was performed according to the departmental dose-optimization program which includes automated exposure control, adjustment of the mA and/or kV according to patient size and/or use of iterative reconstruction technique. COMPARISON:  CT brain 07/15/2020 FINDINGS: Brain: No acute territorial infarction, hemorrhage or intracranial mass. Mild chronic small vessel ischemic changes of the white matter and mild atrophy Vascular: No hyperdense vessels.  Carotid vascular calcification Skull: Normal. Negative for fracture or focal lesion. Sinuses/Orbits: Chronic sinus disease right sphenoid sinus. Mucosal thickening in the anterior ethmoid and right frontal sinus Other: None IMPRESSION: 1. No CT evidence for acute intracranial abnormality. 2. Mild atrophy and chronic small vessel ischemic changes of the white matter. 3. Chronic sinus disease.  Electronically Signed   By: Jasmine Pang M.D.   On: 04/05/2023 19:14   DG Chest Port 1 View  Result Date: 04/05/2023 CLINICAL DATA:  Possible sepsis EXAM: PORTABLE CHEST 1 VIEW COMPARISON:  01/23/2023 FINDINGS: Low lung volumes. Mild bronchitic changes. No consolidation, pleural effusion, or pneumothorax. IMPRESSION: Low lung volumes with mild bronchitic changes. Electronically Signed   By: Jasmine Pang M.D.   On: 04/05/2023 19:04    EKG: I independently viewed the EKG done and my findings are as followed: Sinus tachycardia at rate of 117 bpm with LAFB and nonspecific T wave abnormality  Assessment/Plan Present on Admission:  Sepsis, due to unspecified organism, unspecified whether acute organ dysfunction present [facility])  Essential hypertension  Mixed hyperlipidemia  Principal Problem:   Sepsis, due to unspecified organism, unspecified whether acute organ dysfunction present [facility]) Active Problems:   Essential hypertension   Mixed hyperlipidemia   Otitis media   Mastoiditis   Type 2 diabetes mellitus with hyperglycemia (HCC)   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   BPH (benign prostatic hyperplasia)  Sepsis due to unspecified organism Acute otitis media Acute mastoiditis Patient met sepsis criteria due to being febrile and tachycardic on arrival to the ED CT temporal bones with contrast was suggestive of otitis media and uncomplicated mastoiditis Patient was started on IV ceftriaxone, we shall continue with same at this time Continue Tylenol as needed for fever and mild pain Continue IV Toradol 15 mg every 8 hours as needed for moderate/severe pain Consider steroids for persistent worsening pain in the ear ENT consult by the ED team was appreciated Patient to follow-up with ENT as an outpatient in 1 to 2 weeks  Type 2 diabetes mellitus with hyperglycemia  Hemoglobin A1c on 12/17/2022 was 6.7, this will be repeated Continue ISS and hypoglycemia protocol*** Metformin will be  held at this time  HIV Continue Descovy and Tivicay  Essential hypertension Continue amlodipine  Mixed hyperlipidemia Continue Zetia  GERD Continue Protonix  BPH Continue Rapaflo  Multiple sclerosis Patient [facility] with [facility]   DVT prophylaxis: Lovenox  Advance Care Planning: CODE STATUS: Full code  Consults: ENT by ED PA  Family Communication: ***  Severity  of Illness: The appropriate patient status for this patient is INPATIENT. Inpatient status is judged to be reasonable and necessary in order to provide the required intensity of service to ensure the patient's safety. The patient's presenting symptoms, physical exam findings, and initial radiographic and laboratory data in the context of their chronic comorbidities is felt to place them at high risk for further clinical deterioration. Furthermore, it is not anticipated that the patient will be medically stable for discharge from the hospital within 2 midnights of admission.   * I certify that at the point of admission it is my clinical judgment that the patient will require inpatient hospital care spanning beyond 2 midnights from the point of admission due to high intensity of service, high risk for further deterioration and high frequency of surveillance required.*  Author: Frankey Shown, DO 04/05/2023 10:56 PM  For on call review [email or web address].

## 2023-04-05 NOTE — ED Notes (Signed)
Attempted to call report on patient, charge nurse hasn't assigned a nurse yet will call back.

## 2023-04-05 NOTE — ED Provider Triage Note (Signed)
Emergency Medicine Provider Triage Evaluation Note  QUILLEN PON , a 77 y.o. male  was evaluated in triage.  Pt complains of right ear pain and headache in temporal region for the past month but has worsened over the past week.  Review of Systems  Positive: Right otalgia, headache Negative: Fever, visual disturbances, weakness  Physical Exam  BP (!) 176/88 (BP Location: Right Arm)   Pulse (!) 116   Temp 99.8 F (37.7 C) (Oral)   Resp 18   Ht 5\' 9"  (1.753 m)   Wt 90.7 kg   SpO2 96%   BMI 29.53 kg/m  Gen:   Awake, no distress   Resp:  Normal effort  MSK:   Moves extremities without difficulty  Other:    Medical Decision Making  Medically screening exam initiated at 4:00 PM.  Appropriate orders placed.  [name] was informed that the remainder of the evaluation will be completed by another provider, this initial triage assessment does not replace that evaluation, and the importance of remaining in the ED until their evaluation is complete.  Initial labwork and ekg ordered.   Judithann Sheen, PA 04/05/23 [phone]

## 2023-04-05 NOTE — ED Triage Notes (Signed)
Pt with right ear ache x 3-4 weeks, worse last night per pt. + HA

## 2023-04-06 DIAGNOSIS — A419 Sepsis, unspecified organism: Secondary | ICD-10-CM | POA: Diagnosis not present

## 2023-04-06 LAB — COMPREHENSIVE METABOLIC PANEL
ALT: 19 U/L (ref 0–44)
AST: 19 U/L (ref 15–41)
Albumin: 3.9 g/dL (ref 3.5–5.0)
Alkaline Phosphatase: 60 U/L (ref 38–126)
Anion gap: 12 (ref 5–15)
BUN: 16 mg/dL (ref 8–23)
CO2: 25 mmol/L (ref 22–32)
Calcium: 8.8 mg/dL — ABNORMAL LOW (ref 8.9–10.3)
Chloride: 103 mmol/L (ref 98–111)
Creatinine, Ser: 1.25 mg/dL — ABNORMAL HIGH (ref 0.61–1.24)
GFR, Estimated: 59 mL/min — ABNORMAL LOW (ref >60)
Glucose, Bld: 120 mg/dL — ABNORMAL HIGH (ref 70–99)
Potassium: 3.6 mmol/L (ref 3.5–5.1)
Sodium: 140 mmol/L (ref 135–145)
Total Bilirubin: 0.8 mg/dL (ref 0.3–1.2)
Total Protein: 7.3 g/dL (ref 6.5–8.1)

## 2023-04-06 LAB — PHOSPHORUS: Phosphorus: 4 mg/dL (ref 2.5–4.6)

## 2023-04-06 LAB — GLUCOSE, CAPILLARY
Glucose-Capillary: 118 mg/dL — ABNORMAL HIGH (ref 70–99)
Glucose-Capillary: 117 mg/dL — ABNORMAL HIGH (ref 70–99)
Glucose-Capillary: 119 mg/dL — ABNORMAL HIGH (ref 70–99)
Glucose-Capillary: 122 mg/dL — ABNORMAL HIGH (ref 70–99)
Glucose-Capillary: 128 mg/dL — ABNORMAL HIGH (ref 70–99)

## 2023-04-06 LAB — CBC
HCT: 43.1 % (ref 39.0–52.0)
Hemoglobin: 13.5 g/dL (ref 13.0–17.0)
MCH: 28.2 pg (ref 26.0–34.0)
MCHC: 31.3 g/dL (ref 30.0–36.0)
MCV: 90.2 fL (ref 80.0–100.0)
Platelets: 218 10*3/uL (ref 150–400)
RBC: 4.78 MIL/uL (ref 4.22–5.81)
RDW: 15.6 % — ABNORMAL HIGH (ref 11.5–15.5)
WBC: 8.5 10*3/uL (ref 4.0–10.5)
nRBC: 0 % (ref 0.0–0.2)

## 2023-04-06 LAB — HEMOGLOBIN A1C
Hgb A1c MFr Bld: 6.7 % — ABNORMAL HIGH (ref 4.8–5.6)
Mean Plasma Glucose: 145.59 mg/dL

## 2023-04-06 LAB — MAGNESIUM: Magnesium: 1.9 mg/dL (ref 1.7–2.4)

## 2023-04-06 MED ORDER — INSULIN ASPART 100 UNIT/ML IJ SOLN
0.0000 [IU] | Freq: Three times a day (TID) | INTRAMUSCULAR | Status: DC
  Administered 2023-04-06: 1 [IU] via SUBCUTANEOUS

## 2023-04-06 MED ORDER — EMTRICITABINE-TENOFOVIR AF 200-25 MG PO TABS: 1.0000 | ORAL_TABLET | Freq: Every day | ORAL | Status: DC

## 2023-04-06 MED ORDER — DOLUTEGRAVIR SODIUM 50 MG PO TABS
50.0000 mg | ORAL_TABLET | Freq: Every day | ORAL | Status: DC
  Administered 2023-04-06 – 2023-04-07 (×2): 50 mg via ORAL
  Filled 2023-04-06 (×3): qty 1

## 2023-04-06 MED ORDER — SODIUM CHLORIDE 0.9 % IV SOLN: INTRAVENOUS | Status: AC | PRN

## 2023-04-06 MED ORDER — KETOROLAC TROMETHAMINE 15 MG/ML IJ SOLN
15.0000 mg | Freq: Three times a day (TID) | INTRAMUSCULAR | Status: DC | PRN
  Administered 2023-04-06 – 2023-04-07 (×2): 15 mg via INTRAVENOUS
  Filled 2023-04-06 (×2): qty 1

## 2023-04-06 MED ORDER — SODIUM CHLORIDE 0.9 % IV SOLN
2.0000 g | INTRAVENOUS | Status: DC
  Administered 2023-04-06 – 2023-04-07 (×2): 2 g via INTRAVENOUS
  Filled 2023-04-06 (×2): qty 20

## 2023-04-06 NOTE — Progress Notes (Signed)
Transition of Care [facility]) - Inpatient Brief Assessment   Patient Details  Name: Kerry Solis MRN: [id number] Date of Birth: 08/30/1945  Transition of Care [facility]) CM/SW Contact:    Dailan Pfalzgraf A Astrid Vides, RN Phone Number: 04/06/2023, 12:07 PM   Clinical Narrative: Patient admitted with Sepsis. Has primary care physician on board with care. From home. TOC assessed and no needs identified at this time. We will continue to monitor patient advancement through interdisciplinary progression rounds. If new patient transition needs arise, please place a TOC consult.    Transition of Care Asessment: Insurance and Status: Insurance coverage has been reviewed Patient has primary care physician: Yes Home environment has been reviewed: From Home Prior level of function:: Independent Prior/Current Home Services: No current home services Social Determinants of Health Reivew: SDOH reviewed no interventions necessary Readmission risk has been reviewed: Yes Transition of care needs: (P) no transition of care needs at this time

## 2023-04-06 NOTE — Progress Notes (Signed)
PROGRESS NOTE  Kerry Solis, is a 77 y.o. male, DOB - 12-24-1945, ZOX:[id number]  Admit date - 04/05/2023   Admitting Physician Kerry Shown, DO  Outpatient Primary MD for the patient is Luking, Jonna Coup, MD  LOS - 1  Chief Complaint  Patient presents with   Otalgia      Brief Narrative:  77 y.o. male with medical history significant of HIV , HTN, DM2, prior history of cryptococcus meningitis, prostate cancer and anxiety disorder admitted on 04/05/2023 with right-sided otitis medial and mastoiditis    -Assessment and Plan: 1)Right-sided otitis medial and mastoiditis as well as chronic right-sided sinusitis ---ENT consult appreciated from Dr. Ashok Croon noted -Patient with underlying immunodeficiency/HIV with prior history of cryptococcus meningitis -CT head without contrast and CT of the temporal bones with contrast noted -Continue IV Rocephin 2 g daily -Possible discharge on Augmentin if continues to improve clinically -Blood cultures NGTD -WBC WNL -outpatient follow-up with ENT in 1 to 2 weeks at [address]--- After discharge advised -Tmax 102.4 T-current 99.4  2)HIV--continue PTA medications  3)DM2-A1c 6.7 reflecting good diabetic control PTA Use Novolog/Humalog Sliding scale insulin with Accu-Cheks/Fingersticks as ordered   4)GERD=-continue Protonix  5)BPH--- continue Rapaflo  6) multiple sclerosis--- continue outpatient follow-up with [facility]  Status is: Inpatient   Disposition: The patient is from: Home              Anticipated d/c is to: Home              Anticipated d/c date is: 1 day              Patient currently is not medically stable to d/c. Barriers: Not Clinically Stable-   Code Status :  -  Code Status: Full Code   Family Communication:    NA (patient is alert, awake and coherent)  DVT Prophylaxis  :   - SCDs   enoxaparin (LOVENOX) injection 40 mg Start: 04/06/23 1000 SCDs Start: 04/05/23  2242   Lab Results  Component Value Date   PLT 218 04/06/2023    Inpatient Medications  Scheduled Meds:  amLODipine  5 mg Oral Daily   dolutegravir  50 mg Oral Daily   emtricitabine-tenofovir AF  1 tablet Oral Daily   enoxaparin (LOVENOX) injection  40 mg Subcutaneous Q24H   ezetimibe  10 mg Oral Daily   insulin aspart  0-15 Units Subcutaneous TID WC   pantoprazole  40 mg Oral Daily   tamsulosin  0.4 mg Oral QPC supper   Continuous Infusions:  sodium chloride 10 mL/hr at 04/06/23 0918   cefTRIAXone (ROCEPHIN)  IV 2 g (04/06/23 0920)   PRN Meds:.sodium chloride, acetaminophen **OR** acetaminophen, ketorolac, prochlorperazine   Anti-infectives (From admission, onward)    Start     Dose/Rate Route Frequency Ordered Stop   04/06/23 1000  cefTRIAXone (ROCEPHIN) 1 g in sodium chloride 0.9 % 100 mL IVPB  Status:  Discontinued        1 g 200 mL/hr over 30 Minutes Intravenous Every 24 hours 04/05/23 2234 04/06/23 0904   04/06/23 1000  emtricitabine-tenofovir AF (DESCOVY) 200-25 MG per tablet 1 tablet        1 tablet Oral Daily 04/05/23 2303     04/06/23 1000  dolutegravir (TIVICAY) tablet 50 mg  Status:  Discontinued        50 mg Oral Daily 04/05/23 2303 04/06/23 0743   04/06/23 1000  emtricitabine-tenofovir AF (DESCOVY) 200-25 MG per  tablet 1 tablet  Status:  Discontinued        1 tablet Oral Daily 04/06/23 0746 04/06/23 0747   04/06/23 1000  dolutegravir (TIVICAY) tablet 50 mg        50 mg Oral Daily 04/06/23 0746     04/06/23 1000  cefTRIAXone (ROCEPHIN) 2 g in sodium chloride 0.9 % 100 mL IVPB        2 g 200 mL/hr over 30 Minutes Intravenous Every 24 hours 04/06/23 0904     04/05/23 1745  cefTRIAXone (ROCEPHIN) 2 g in sodium chloride 0.9 % 100 mL IVPB        2 g 200 mL/hr over 30 Minutes Intravenous  Once 04/05/23 1743 04/05/23 2105   04/05/23 1715  amoxicillin-clavulanate (AUGMENTIN) 875-125 MG per tablet 1 tablet  Status:  Discontinued        1 tablet Oral  Once 04/05/23  1709 04/05/23 1743       Subjective: [name] today has no fevers, no emesis,  No chest pain,   - -Tmax 102.4 T-current 99.4 Right ear mastoid area pain persist  Objective: Vitals:   04/06/23 0220 04/06/23 0447 04/06/23 0544 04/06/23 1528  BP: 139/77 (!) 156/71 (!) 144/71 (!) 140/71  Pulse: 74 85 81 78  Resp: 18 20 17 16   Temp: 98.9 F (37.2 C) 99.4 F (37.4 C) 98.9 F (37.2 C) 99.4 F (37.4 C)  TempSrc: Oral Oral Oral Oral  SpO2: 96% 94% 97% 99%  Weight:      Height:        Intake/Output Summary (Last 24 hours) at 04/06/2023 1754 Last data filed at 04/06/2023 1600 Gross per 24 hour  Intake 202.67 ml  Output --  Net 202.67 ml   Filed Weights   04/05/23 1529  Weight: 90.7 kg    Physical Exam  Gen:- Awake Alert,  in no apparent distress  HEENT:- [place].AT, No sclera icterus Rt TM with erythema, tenderness over the right mastoid area without significant swelling, crepitus or streaking or redness over the mastoid area per se Neck-Supple Neck,No JVD,.  Lungs-  CTAB , fair symmetrical air movement CV- S1, S2 normal, regular  Abd-  +ve B.Sounds, Abd Soft, No tenderness,    Extremity/Skin:- No  edema, pedal pulses present  Psych-affect is appropriate, oriented x3 Neuro-no new focal deficits, no tremors  Data Reviewed: I have personally reviewed following labs and imaging studies  CBC: Recent Labs  Lab 04/05/23 1624 04/06/23 0502  WBC 9.1 8.5  NEUTROABS 6.4  --   HGB 14.2 13.5  HCT 44.4 43.1  MCV 88.8 90.2  PLT 266 218   Basic Metabolic Panel: Recent Labs  Lab 04/05/23 1624 04/06/23 0502  NA 137 140  K 3.8 3.6  CL 104 103  CO2 22 25  GLUCOSE 162* 120*  BUN 12 16  CREATININE 1.10 1.25*  CALCIUM 9.1 8.8*  MG  --  1.9  PHOS  --  4.0   GFR: Estimated Creatinine Clearance: 53.3 mL/min (A) (by C-G formula based on SCr of 1.25 mg/dL (H)). Liver Function Tests: Recent Labs  Lab 04/05/23 1624 04/06/23 0502  AST 26 19  ALT 25 19  ALKPHOS 75 60   BILITOT 0.6 0.8  PROT 7.8 7.3  ALBUMIN 4.5 3.9   HbA1C: Recent Labs    04/06/23 0502  HGBA1C 6.7*   Recent Results (from the past 240 hour(s))  Resp panel by RT-PCR (RSV, Flu A&B, Covid) Anterior Nasal Swab  Status: None   Collection Time: 04/05/23  5:42 PM   Specimen: Anterior Nasal Swab  Result Value Ref Range Status   SARS Coronavirus 2 by RT PCR NEGATIVE NEGATIVE Final    Comment: (NOTE) SARS-CoV-2 target nucleic acids are NOT DETECTED.  The SARS-CoV-2 RNA is generally detectable in upper respiratory specimens during the acute phase of infection. The lowest concentration of SARS-CoV-2 viral copies this assay can detect is 138 copies/mL. A negative result does not preclude SARS-Cov-2 infection and should not be used as the sole basis for treatment or other patient management decisions. A negative result may occur with  improper specimen collection/handling, submission of specimen other than nasopharyngeal swab, presence of viral mutation(s) within the areas targeted by this assay, and inadequate number of viral copies(<138 copies/mL). A negative result must be combined with clinical observations, patient history, and epidemiological information. The expected result is Negative.  Fact Sheet for Patients:  BloggerCourse.com  Fact Sheet for Healthcare Providers:  SeriousBroker.it  This test is no t yet approved or cleared by the [place] FDA and  has been authorized for detection and/or diagnosis of SARS-CoV-2 by FDA under an Emergency Use Authorization (EUA). This EUA will remain  in effect (meaning this test can be used) for the duration of the COVID-19 declaration under Section 564(b)(1) of the Act, 21 U.S.C.section 360bbb-3(b)(1), unless the authorization is terminated  or revoked sooner.       Influenza A by PCR NEGATIVE NEGATIVE Final   Influenza B by PCR NEGATIVE NEGATIVE Final    Comment: (NOTE) The  Xpert Xpress SARS-CoV-2/FLU/RSV plus assay is intended as an aid in the diagnosis of influenza from Nasopharyngeal swab specimens and should not be used as a sole basis for treatment. Nasal washings and aspirates are unacceptable for Xpert Xpress SARS-CoV-2/FLU/RSV testing.  Fact Sheet for Patients: BloggerCourse.com  Fact Sheet for Healthcare Providers: SeriousBroker.it  This test is not yet approved or cleared by the [place] FDA and has been authorized for detection and/or diagnosis of SARS-CoV-2 by FDA under an Emergency Use Authorization (EUA). This EUA will remain in effect (meaning this test can be used) for the duration of the COVID-19 declaration under Section 564(b)(1) of the Act, 21 U.S.C. section 360bbb-3(b)(1), unless the authorization is terminated or revoked.     Resp Syncytial Virus by PCR NEGATIVE NEGATIVE Final    Comment: (NOTE) Fact Sheet for Patients: BloggerCourse.com  Fact Sheet for Healthcare Providers: SeriousBroker.it  This test is not yet approved or cleared by the [place] FDA and has been authorized for detection and/or diagnosis of SARS-CoV-2 by FDA under an Emergency Use Authorization (EUA). This EUA will remain in effect (meaning this test can be used) for the duration of the COVID-19 declaration under Section 564(b)(1) of the Act, 21 U.S.C. section 360bbb-3(b)(1), unless the authorization is terminated or revoked.  Performed at [facility], [address], [place], [place] [address]   Blood Culture (routine x 2)     Status: None (Preliminary result)   Collection Time: 04/05/23  5:42 PM   Specimen: BLOOD  Result Value Ref Range Status   Specimen Description BLOOD RIGHT ANTECUBITAL  Final   Special Requests   Final    BOTTLES DRAWN AEROBIC AND ANAEROBIC Blood Culture results may not be optimal due to an excessive volume of blood  received in culture bottles   Culture   Final    NO GROWTH < 24 HOURS Performed at [facility], [address], [place],  [place] [address]    Report Status PENDING  Incomplete  Blood Culture (routine x 2)     Status: None (Preliminary result)   Collection Time: 04/05/23  5:42 PM   Specimen: BLOOD  Result Value Ref Range Status   Specimen Description BLOOD BLOOD RIGHT ARM  Final   Special Requests   Final    BOTTLES DRAWN AEROBIC AND ANAEROBIC Blood Culture results may not be optimal due to an excessive volume of blood received in culture bottles   Culture   Final    NO GROWTH < 12 HOURS Performed at [facility], [address], [place], [place] [address]    Report Status PENDING  Incomplete    Radiology Studies: CT Temporal Bones W Contrast  Result Date: 04/05/2023 CLINICAL DATA:  Right ear ache for 3 or 4 weeks EXAM: CT TEMPORAL BONES WITH CONTRAST TECHNIQUE: Axial and coronal plane CT imaging of the petrous temporal bones was performed with thin-collimation image reconstruction following intravenous contrast administration. Multiplanar CT image reconstructions were also generated. RADIATION DOSE REDUCTION: This exam was performed according to the departmental dose-optimization program which includes automated exposure control, adjustment of the mA and/or kV according to patient size and/or use of iterative reconstruction technique. CONTRAST:  75mL OMNIPAQUE IOHEXOL 300 MG/ML  SOLN COMPARISON:  07/15/2020 CT head FINDINGS: RIGHT TEMPORAL BONE External auditory canal: Normal. Middle ear cavity: Fluid in the dependent portion of the middle ear, particularly in the hypo tympanum. The scutum and ossicles appear intact. The tegmen tympani is intact. Inner ear structures: The cochlea, vestibule and semicircular canals are normal. The vestibular aqueduct is not enlarged. Internal auditory and facial nerve canals:  Normal Mastoid air cells: Fluid in the right mastoid air cells. No osseous erosion.  LEFT TEMPORAL BONE External auditory canal: Normal. Middle ear cavity: Normally aerated. The scutum and ossicles are normal. The tegmen tympani is intact. Inner ear structures: The cochlea, vestibule and semicircular canals are normal. The vestibular aqueduct is not enlarged. Internal auditory and facial nerve canals:  Normal. Mastoid air cells: Normally aerated. No osseous erosion. Vascular: Normal appearance of the carotid canal, jugular bulbs, and sigmoid plates. Limited intracranial:  No acute or significant finding. Visible orbits/paranasal sinuses: Sequela of chronic right sinusitis. Mucous retention cyst in the right maxillary sinus. Mucosal thickening in the left maxillary sinus. Mucosal thickening in the ethmoid air cells and right frontal sinus. No acute finding in the imaged orbits. Soft tissues: Normal. IMPRESSION: 1. Fluid in the right mastoid air cells and right middle ear cavity, without osseous erosion, concerning for otitis media and uncomplicated mastoiditis. 2. Normal left temporal bone. 3. Sequela of chronic right sinusitis. Electronically Signed   By: Wiliam Ke M.D.   On: 04/05/2023 19:29   CT Head Wo Contrast  Result Date: 04/05/2023 CLINICAL DATA:  Headache EXAM: CT HEAD WITHOUT CONTRAST TECHNIQUE: Contiguous axial images were obtained from the base of the skull through the vertex without intravenous contrast. RADIATION DOSE REDUCTION: This exam was performed according to the departmental dose-optimization program which includes automated exposure control, adjustment of the mA and/or kV according to patient size and/or use of iterative reconstruction technique. COMPARISON:  CT brain 07/15/2020 FINDINGS: Brain: No acute territorial infarction, hemorrhage or intracranial mass. Mild chronic small vessel ischemic changes of the white matter and mild atrophy Vascular: No hyperdense vessels.  Carotid vascular calcification Skull: Normal. Negative for fracture or focal lesion.  Sinuses/Orbits: Chronic sinus disease right sphenoid sinus. Mucosal thickening in the anterior ethmoid and  right frontal sinus Other: None IMPRESSION: 1. No CT evidence for acute intracranial abnormality. 2. Mild atrophy and chronic small vessel ischemic changes of the white matter. 3. Chronic sinus disease. Electronically Signed   By: Jasmine Pang M.D.   On: 04/05/2023 19:14   DG Chest Port 1 View  Result Date: 04/05/2023 CLINICAL DATA:  Possible sepsis EXAM: PORTABLE CHEST 1 VIEW COMPARISON:  01/23/2023 FINDINGS: Low lung volumes. Mild bronchitic changes. No consolidation, pleural effusion, or pneumothorax. IMPRESSION: Low lung volumes with mild bronchitic changes. Electronically Signed   By: Jasmine Pang M.D.   On: 04/05/2023 19:04    scheduled Meds:  amLODipine  5 mg Oral Daily   dolutegravir  50 mg Oral Daily   emtricitabine-tenofovir AF  1 tablet Oral Daily   enoxaparin (LOVENOX) injection  40 mg Subcutaneous Q24H   ezetimibe  10 mg Oral Daily   insulin aspart  0-15 Units Subcutaneous TID WC   pantoprazole  40 mg Oral Daily   tamsulosin  0.4 mg Oral QPC supper   Continuous Infusions:  sodium chloride 10 mL/hr at 04/06/23 0918   cefTRIAXone (ROCEPHIN)  IV 2 g (04/06/23 0920)    LOS: 1 day   Shon Hale M.D on 04/06/2023 at 5:54 PM  Go to [email or web address] - for contact info  [facility] - Office  [phone]  If 7PM-7AM, please contact night-coverage [email or web address] 04/06/2023, 5:54 PM

## 2023-04-06 NOTE — Progress Notes (Signed)
Has complained of pain in right ear and eye today. Received tylenol twice for pain.  Temp just now of 99.4.  Ambulated to bathroom with walker and voided.

## 2023-04-06 NOTE — Plan of Care (Signed)
  Problem: Health Behavior/Discharge Planning: Goal: Ability to manage health-related needs will improve Outcome: Progressing   Problem: Activity: Goal: Risk for activity intolerance will decrease Outcome: Progressing   Problem: Coping: Goal: Level of anxiety will decrease Outcome: Progressing   Problem: Pain Managment: Goal: General experience of comfort will improve Outcome: Progressing   

## 2023-04-07 DIAGNOSIS — A419 Sepsis, unspecified organism: Secondary | ICD-10-CM | POA: Diagnosis not present

## 2023-04-07 LAB — GLUCOSE, CAPILLARY: Glucose-Capillary: 115 mg/dL — ABNORMAL HIGH (ref 70–99)

## 2023-04-07 MED ORDER — DOXYCYCLINE HYCLATE 100 MG PO TABS
100.0000 mg | ORAL_TABLET | Freq: Two times a day (BID) | ORAL | 0 refills | Status: AC
Start: 2023-04-07 — End: 2023-04-12

## 2023-04-07 MED ORDER — CEPHALEXIN 500 MG PO CAPS: 500.0000 mg | ORAL_CAPSULE | Freq: Three times a day (TID) | ORAL | 0 refills | Status: AC

## 2023-04-07 MED ORDER — DOXYCYCLINE HYCLATE 100 MG PO TABS
100.0000 mg | ORAL_TABLET | Freq: Once | ORAL | Status: AC
  Administered 2023-04-07: 100 mg via ORAL
  Filled 2023-04-07: qty 1

## 2023-04-07 NOTE — Discharge Summary (Signed)
Kerry Solis, is a 77 y.o. male  DOB 06/27/1945  MRN [id number].  Admission date:  04/05/2023  Admitting Physician  Frankey Shown, DO  Discharge Date:  04/07/2023   Primary MD  Babs Sciara, MD  Recommendations for primary care physician for things to follow:  1)Outpatient follow-up with ENT physician Dr. Ashok Croon in 1  week at [address], [address].--- -Please call [phone] recheck and reevaluation of her right ear and right mastoid area infection  2) complete antibiotics as prescribed  Admission Diagnosis  Acute otitis media, unspecified otitis media type [H66.90] Sepsis, due to unspecified organism, unspecified whether acute organ dysfunction present [facility]) [A41.9]   Discharge Diagnosis  Acute otitis media, unspecified otitis media type [H66.90] Sepsis, due to unspecified organism, unspecified whether acute organ dysfunction present (HCC) [A41.9]    Principal Problem:   Sepsis, due to unspecified organism, unspecified whether acute organ dysfunction present [facility]) Active Problems:   Essential hypertension   Mixed hyperlipidemia   Otitis media   Mastoiditis   Type 2 diabetes mellitus with hyperglycemia (HCC)   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   BPH (benign prostatic hyperplasia)      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   Depression    Elevated liver enzymes    Glaucoma    High triglycerides    History of gout    HIV (human immunodeficiency virus infection) (HCC)    Hypertension    Leukopenia    Low CD4   Multiple sclerosis (HCC)    uses a cane   Myocardial infarction (HCC)    45 years ago   Prostate cancer [facility])     Past Surgical History:  Procedure Laterality Date   BIOPSY  08/14/2016   Procedure: BIOPSY;  Surgeon: West Bali, MD;   Location[facility];  Service: Endoscopy;;  gastric bx's   CHOLECYSTECTOMY  06/10/2011   Procedure: LAPAROSCOPIC CHOLECYSTECTOMY;  Surgeon: Fabio Bering, MD;  Location: [facility] ORS;  Service: General;  Laterality: N/A;   COLONOSCOPY  03/10/2009   WNU:UVOZDGUY internal hemorrhoids/6-mm sessile ascending colon polyp/tortuous colon, diverticulosis. TA   COLONOSCOPY WITH PROPOFOL N/A 04/19/2015   Dr. Darrick Penna: sessile serrated adenoma, surveillance in 5 years    ESOPHAGOGASTRODUODENOSCOPY  06/23/2008   QIH:KVQQVZD gastritis/schatzki ring   ESOPHAGOGASTRODUODENOSCOPY (EGD) WITH PROPOFOL N/A 08/14/2016   Dr. Darrick Penna: moderate Schatzi's ring s/p dilation, LA Grade A esophagitis,small hiatal hernia, chronic gastritis (negative H.pylori), one duodenal diverticulum   LAPAROSCOPIC APPENDECTOMY N/A 12/13/2017   Procedure: APPENDECTOMY LAPAROSCOPIC;  Surgeon: Franky Macho, MD;  Location: [facility] OR[facility];  Service: General;  Laterality: N/A;   NECK SURGERY  unsure   disc   POLYPECTOMY N/A 04/19/2015   Procedure: POLYPECTOMY;  Surgeon: West Bali, MD;  Location: [facility];  Service: Endoscopy;  Laterality: N/A;  ascending colon   PROSTATE BIOPSY     RADIOACTIVE  SEED IMPLANT N/A 11/29/2016   Procedure: RADIOACTIVE SEED IMPLANT/BRACHYTHERAPY IMPLANT;  Surgeon: Marcine Matar, MD;  Location: [facility];  Service: Urology;  Laterality: N/A;  81 seeds implanted   SAVORY DILATION N/A 08/14/2016   Procedure: SAVORY DILATION;  Surgeon: West Bali, MD;  [facility];  Service: Endoscopy;  Laterality: N/A;     HPI  from the history and physical done on the day of admission:   HPI: Kerry Solis is a 77 y.o. male with medical history significant of HIV who presents to the emergency department due to 2-week onset of right ear pain which worsened within the last few days and progressively got worse last night.  Patient complained of worsening balance from baseline within the last few days.  He  denies fever, headache, drainage from the ear, chills.  He presents to the ED for further evaluation and management.   ED Course:  In the emergency department, he was tachycardic, BP was 176/88, other vital signs were within normal range.  Workup in the ED showed normal CBC and BMP except for blood glucose of 162 urinalysis was normal, lactic acid was normal.  Influenza A, B, SARS, respiratory, RSV was negative.  Blood culture was pending. CT temporal bones with contrast showed fluid in the right mastoid air cells and right middle ear cavity, without osseous erosion, concerning for otitis media and uncomplicated mastoiditis.  Normal left temporal bone.  Sequela of chronic right sinusitis. CT head without contrast showed no CT evidence for acute or acute abnormality Chest x-ray showed low lung volumes with mild bronchitis changes ENT specialist (Dr. Irene Pap, [facility] Friendly)  was consulted and recommended medical management of otitis media with antibiotic and possibly steroids, analgesia and outpatient follow-up with ENT in 1 to 2 weeks He was started on IV ceftriaxone, IV Toradol 15 mg x 1 was given, Tylenol was given due to fever. Hospitalist was asked to admit patient for further evaluation and management.   Review of Systems: Review of systems as noted in the HPI. All other systems reviewed and are negative.     Hospital Course:     Brief Narrative:  77 y.o. male with medical history significant of HIV , HTN, DM2, prior history of cryptococcus meningitis, prostate cancer and anxiety disorder admitted on 04/05/2023 with right-sided otitis medial and mastoiditis     -Assessment and Plan: 1)Right-sided otitis medial and mastoiditis as well as chronic right-sided sinusitis ---ENT consult appreciated from Dr. Ashok Croon noted -Patient with underlying immunodeficiency/HIV with prior history of cryptococcus meningitis -CT head without contrast and CT of the temporal bones with contrast  noted Treated with IV Rocephin 2 g daily -Significantly improved right ear moist area discomfort -Okay to discharge on Augmentin, added doxycycline for atypical organisms and possible community MRSA type coverage -Blood cultures NGTD -WBC WNL -outpatient follow-up with ENT  Dr. Ashok Croon  in 1 to 2 weeks at [address].--- After discharge advised -No further fevers   2)HIV--continue PTA medications   3)DM2-A1c 6.7 reflecting good diabetic control PTA -Resume PTA regimen including metformin   4)GERD=-continue Protonix   5)BPH--- continue Rapaflo   6) multiple sclerosis--- continue outpatient follow-[facility] with [facility]  7)HTN--- stable, continue amlodipine     Disposition: The patient is from: Home              Anticipated d/c is to: Home  Discharge Condition: Stable  Follow UP   Follow-up Information  Ashok Croon, MD. Schedule an appointment as soon as possible for a visit in 1 week(s).   Specialty: Otolaryngology Contact information: [address] [place] [address] [phone]                  Consults obtained -ENT  Diet and Activity recommendation:  As advised  Discharge Instructions    Discharge Instructions     Call MD for:  difficulty breathing, headache or visual disturbances   Complete by: As directed    Call MD for:  persistant dizziness or light-headedness   Complete by: As directed    Call MD for:  persistant nausea and vomiting   Complete by: As directed    Call MD for:  temperature >100.4   Complete by: As directed    Diet - low sodium heart healthy   Complete by: As directed    Discharge instructions   Complete by: As directed    1)Outpatient follow-up with ENT physician Dr. Ashok Croon in 1  week at [address], [address].--- -Please call [phone] recheck and reevaluation of her right ear and right mastoid area infection  2)  complete antibiotics as prescribed   Increase activity slowly   Complete by: As directed        Discharge Medications     Allergies as of 04/07/2023       Reactions   Pravastatin    myalgias, elevated CK   Yellow Jacket Venom Swelling        Medication List     TAKE these medications    allopurinol 100 MG tablet Commonly known as: ZYLOPRIM TAKE ONE TABLET BY MOUTH EVERY DAY FOR GOUT   amitriptyline 25 MG tablet Commonly known as: ELAVIL Take 50 mg by mouth at bedtime.   amLODipine 5 MG tablet Commonly known as: NORVASC TAKE ONE TABLET BY MOUTH EVERY DAY HIGH BLOOD PRESSURE   cephALEXin 500 MG capsule Commonly known as: Keflex Take 1 capsule (500 mg total) by mouth 3 (three) times daily for 5 days.   Descovy 200-25 MG tablet Generic drug: emtricitabine-tenofovir AF Take 1 tablet by mouth daily.   doxycycline 100 MG tablet Commonly known as: VIBRA-TABS Take 1 tablet (100 mg total) by mouth 2 (two) times daily for 5 days.   ezetimibe 10 MG tablet Commonly known as: Zetia Take 1 tablet (10 mg total) by mouth daily.   fluticasone 50 MCG/ACT nasal spray Commonly known as: FLONASE Place 2 sprays into both nostrils daily.   gabapentin 100 MG capsule Commonly known as: NEURONTIN Take 1 capsule by mouth 3 (three) times daily.   metFORMIN 500 MG 24 hr tablet Commonly known as: GLUCOPHAGE-XR Take 1 tablet (500 mg total) by mouth 2 (two) times daily with a meal.   pantoprazole 40 MG tablet Commonly known as: PROTONIX TAKE ONE TABLET BY MOUTH EVERY DAY   silodosin 8 MG Caps capsule Commonly known as: RAPAFLO Take 1 capsule (8 mg total) by mouth at bedtime.   Tivicay 50 MG tablet Generic drug: dolutegravir Take 50 mg by mouth daily.       Major procedures and Radiology Reports - PLEASE review detailed and final reports for all details, in brief -   CT Temporal Bones W Contrast  Result Date: 04/05/2023 CLINICAL DATA:  Right ear ache for 3 or 4 weeks  EXAM: CT TEMPORAL BONES WITH CONTRAST TECHNIQUE: Axial and coronal plane CT imaging of the petrous temporal bones was performed  with thin-collimation image reconstruction following intravenous contrast administration. Multiplanar CT image reconstructions were also generated. RADIATION DOSE REDUCTION: This exam was performed according to the departmental dose-optimization program which includes automated exposure control, adjustment of the mA and/or kV according to patient size and/or use of iterative reconstruction technique. CONTRAST:  75mL OMNIPAQUE IOHEXOL 300 MG/ML  SOLN COMPARISON:  07/15/2020 CT head FINDINGS: RIGHT TEMPORAL BONE External auditory canal: Normal. Middle ear cavity: Fluid in the dependent portion of the middle ear, particularly in the hypo tympanum. The scutum and ossicles appear intact. The tegmen tympani is intact. Inner ear structures: The cochlea, vestibule and semicircular canals are normal. The vestibular aqueduct is not enlarged. Internal auditory and facial nerve canals:  Normal Mastoid air cells: Fluid in the right mastoid air cells. No osseous erosion. LEFT TEMPORAL BONE External auditory canal: Normal. Middle ear cavity: Normally aerated. The scutum and ossicles are normal. The tegmen tympani is intact. Inner ear structures: The cochlea, vestibule and semicircular canals are normal. The vestibular aqueduct is not enlarged. Internal auditory and facial nerve canals:  Normal. Mastoid air cells: Normally aerated. No osseous erosion. Vascular: Normal appearance of the carotid canal, jugular bulbs, and sigmoid plates. Limited intracranial:  No acute or significant finding. Visible orbits/paranasal sinuses: Sequela of chronic right sinusitis. Mucous retention cyst in the right maxillary sinus. Mucosal thickening in the left maxillary sinus. Mucosal thickening in the ethmoid air cells and right frontal sinus. No acute finding in the imaged orbits. Soft tissues: Normal. IMPRESSION: 1. Fluid  in the right mastoid air cells and right middle ear cavity, without osseous erosion, concerning for otitis media and uncomplicated mastoiditis. 2. Normal left temporal bone. 3. Sequela of chronic right sinusitis. Electronically Signed   By: Wiliam Ke M.D.   On: 04/05/2023 19:29   CT Head Wo Contrast  Result Date: 04/05/2023 CLINICAL DATA:  Headache EXAM: CT HEAD WITHOUT CONTRAST TECHNIQUE: Contiguous axial images were obtained from the base of the skull through the vertex without intravenous contrast. RADIATION DOSE REDUCTION: This exam was performed according to the departmental dose-optimization program which includes automated exposure control, adjustment of the mA and/or kV according to patient size and/or use of iterative reconstruction technique. COMPARISON:  CT brain 07/15/2020 FINDINGS: Brain: No acute territorial infarction, hemorrhage or intracranial mass. Mild chronic small vessel ischemic changes of the white matter and mild atrophy Vascular: No hyperdense vessels.  Carotid vascular calcification Skull: Normal. Negative for fracture or focal lesion. Sinuses/Orbits: Chronic sinus disease right sphenoid sinus. Mucosal thickening in the anterior ethmoid and right frontal sinus Other: None IMPRESSION: 1. No CT evidence for acute intracranial abnormality. 2. Mild atrophy and chronic small vessel ischemic changes of the white matter. 3. Chronic sinus disease. Electronically Signed   By: Jasmine Pang M.D.   On: 04/05/2023 19:14   DG Chest Port 1 View  Result Date: 04/05/2023 CLINICAL DATA:  Possible sepsis EXAM: PORTABLE CHEST 1 VIEW COMPARISON:  01/23/2023 FINDINGS: Low lung volumes. Mild bronchitic changes. No consolidation, pleural effusion, or pneumothorax. IMPRESSION: Low lung volumes with mild bronchitic changes. Electronically Signed   By: Jasmine Pang M.D.   On: 04/05/2023 19:04    Micro Results   Recent Results (from the past 240 hour(s))  Resp panel by RT-PCR (RSV, Flu A&B, Covid)  Anterior Nasal Swab     Status: None   Collection Time: 04/05/23  5:42 PM   Specimen: Anterior Nasal Swab  Result Value Ref Range Status   SARS Coronavirus 2 by RT  PCR NEGATIVE NEGATIVE Final    Comment: (NOTE) SARS-CoV-2 target nucleic acids are NOT DETECTED.  The SARS-CoV-2 RNA is generally detectable in upper respiratory specimens during the acute phase of infection. The lowest concentration of SARS-CoV-2 viral copies this assay can detect is 138 copies/mL. A negative result does not preclude SARS-Cov-2 infection and should not be used as the sole basis for treatment or other patient management decisions. A negative result may occur with  improper specimen collection/handling, submission of specimen other than nasopharyngeal swab, presence of viral mutation(s) within the areas targeted by this assay, and inadequate number of viral copies(<138 copies/mL). A negative result must be combined with clinical observations, patient history, and epidemiological information. The expected result is Negative.  Fact Sheet for Patients:  BloggerCourse.com  Fact Sheet for Healthcare Providers:  SeriousBroker.it  This test is no t yet approved or cleared by the [place] FDA and  has been authorized for detection and/or diagnosis of SARS-CoV-2 by FDA under an Emergency Use Authorization (EUA). This EUA will remain  in effect (meaning this test can be used) for the duration of the COVID-19 declaration under Section 564(b)(1) of the Act, 21 U.S.C.section 360bbb-3(b)(1), unless the authorization is terminated  or revoked sooner.       Influenza A by PCR NEGATIVE NEGATIVE Final   Influenza B by PCR NEGATIVE NEGATIVE Final    Comment: (NOTE) The Xpert Xpress SARS-CoV-2/FLU/RSV plus assay is intended as an aid in the diagnosis of influenza from Nasopharyngeal swab specimens and should not be used as a sole basis for treatment. Nasal washings  and aspirates are unacceptable for Xpert Xpress SARS-CoV-2/FLU/RSV testing.  Fact Sheet for Patients: BloggerCourse.com  Fact Sheet for Healthcare Providers: SeriousBroker.it  This test is not yet approved or cleared by the [place] FDA and has been authorized for detection and/or diagnosis of SARS-CoV-2 by FDA under an Emergency Use Authorization (EUA). This EUA will remain in effect (meaning this test can be used) for the duration of the COVID-19 declaration under Section 564(b)(1) of the Act, 21 U.S.C. section 360bbb-3(b)(1), unless the authorization is terminated or revoked.     Resp Syncytial Virus by PCR NEGATIVE NEGATIVE Final    Comment: (NOTE) Fact Sheet for Patients: BloggerCourse.com  Fact Sheet for Healthcare Providers: SeriousBroker.it  This test is not yet approved or cleared by the [place] FDA and has been authorized for detection and/or diagnosis of SARS-CoV-2 by FDA under an Emergency Use Authorization (EUA). This EUA will remain in effect (meaning this test can be used) for the duration of the COVID-19 declaration under Section 564(b)(1) of the Act, 21 U.S.C. section 360bbb-3(b)(1), unless the authorization is terminated or revoked.  Performed at [facility], [address], [place], [place] [address]   Blood Culture (routine x 2)     Status: None (Preliminary result)   Collection Time: 04/05/23  5:42 PM   Specimen: BLOOD  Result Value Ref Range Status   Specimen Description BLOOD RIGHT ANTECUBITAL  Final   Special Requests   Final    BOTTLES DRAWN AEROBIC AND ANAEROBIC Blood Culture results may not be optimal due to an excessive volume of blood received in culture bottles   Culture   Final    NO GROWTH 2 DAYS Performed at [facility], [address], [place], [place] [address]    Report Status PENDING  Incomplete  Blood Culture (routine x 2)      Status: None (Preliminary result)   Collection Time: 04/05/23  5:42 PM   Specimen: BLOOD  Result Value Ref Range Status   Specimen Description BLOOD BLOOD RIGHT ARM  Final   Special Requests   Final    BOTTLES DRAWN AEROBIC AND ANAEROBIC Blood Culture results may not be optimal due to an excessive volume of blood received in culture bottles   Culture   Final    NO GROWTH 2 DAYS Performed at [facility], [address] [place], [place] [address]    Report Status PENDING  Incomplete   Today   Subjective    Kerry Solis today has no new complaints - Right ear and mastoid area discomfort has improved significantly No further fever  Or chills           Patient has been seen and examined prior to discharge   Objective   Blood pressure (!) 146/82, pulse 82, temperature 98.9 F (37.2 C), temperature source Oral, resp. rate 19, height 5\' 9"  (1.753 m), weight 90.7 kg, SpO2 94%.   Intake/Output Summary (Last 24 hours) at 04/07/2023 1135 Last data filed at 04/07/2023 0453 Gross per 24 hour  Intake 102.67 ml  Output 1000 ml  Net -897.33 ml    Exam Gen:- Awake Alert, no acute distress  HEENT:- [place].AT, No sclera icterus, much improved--Rt TM with erythema, tenderness over the right mastoid area without significant swelling, crepitus or streaking or redness over the mastoid area per se  Neck-Supple Neck,No JVD,.  Lungs-  CTAB , good air movement bilaterally CV- S1, S2 normal, regular Abd-  +ve B.Sounds, Abd Soft, No tenderness,    Extremity/Skin:- No  edema,   good pulses Psych-affect is appropriate, oriented x3 Neuro-no new focal deficits, no tremors   Data Review   CBC w Diff:  Lab Results  Component Value Date   WBC 8.5 04/06/2023   HGB 13.5 04/06/2023   HGB 15.3 05/23/2020   HCT 43.1 04/06/2023   HCT 45.0 05/23/2020   PLT 218 04/06/2023   PLT 265 05/23/2020   LYMPHOPCT 24 04/05/2023   BANDSPCT 0 10/28/2012   MONOPCT 5 04/05/2023   EOSPCT 1 04/05/2023   BASOPCT  0 04/05/2023   CMP:  Lab Results  Component Value Date   NA 140 04/06/2023   NA 143 01/02/2023   K 3.6 04/06/2023   CL 103 04/06/2023   CO2 25 04/06/2023   BUN 16 04/06/2023   BUN 14 01/02/2023   CREATININE 1.25 (H) 04/06/2023   CREATININE 1.19 12/28/2013   PROT 7.3 04/06/2023   PROT 7.6 03/07/2021   ALBUMIN 3.9 04/06/2023   ALBUMIN 5.0 (H) 03/07/2021   BILITOT 0.8 04/06/2023   BILITOT 0.3 03/07/2021   ALKPHOS 60 04/06/2023   AST 19 04/06/2023   ALT 19 04/06/2023   Total Discharge time is about 33 minutes  Shon Hale M.D on 04/07/2023 at 11:35 AM  Go to [email or web address] -  for contact info  [facility] - Office  [phone]

## 2023-04-07 NOTE — Discharge Instructions (Signed)
1)Outpatient follow-up with ENT physician Dr. Ashok Croon in 1  week at [address].--- -Please call [phone] recheck and reevaluation of her right ear and right mastoid area infection  2) complete antibiotics as prescribed

## 2023-04-07 NOTE — Progress Notes (Signed)
Went over discharge instructions w/ pt.

## 2023-04-07 NOTE — Plan of Care (Signed)

## 2023-04-07 NOTE — Plan of Care (Signed)

## 2023-04-07 NOTE — Progress Notes (Signed)
Transition of Care [facility]) - Inpatient Brief Assessment   Patient Details  Name: CIRE CAUTHON MRN: [id number] Date of Birth: 08-Mar-1946  Transition of Care [facility] CM/SW Contact:    Aarsh Fristoe A Ayva Veilleux, RN Phone Number: 04/07/2023, 11:34 AM   Clinical Narrative: Has plans to discharge today and plans to go home. No TOC needs have been identified at this time.  If new patient transition needs arise, please place a TOC consult. TOC signing off at this time.   Transition of Care Asessment: Insurance and Status: Insurance coverage has been reviewed Patient has primary care physician: Yes Home environment has been reviewed: From Home Prior level of function:: Independent Prior/Current Home Services: No current home services Social Determinants of Health Reivew: SDOH reviewed no interventions necessary Readmission risk has been reviewed: Yes Transition of care needs: no transition of care needs at this time

## 2023-04-08 ENCOUNTER — Ambulatory Visit: Payer: Medicare Other | Admitting: Family Medicine

## 2023-04-08 ENCOUNTER — Telehealth (INDEPENDENT_AMBULATORY_CARE_PROVIDER_SITE_OTHER): Payer: Self-pay | Admitting: Otolaryngology

## 2023-04-08 VITALS — BP 154/80 | HR 82 | Temp 98.4°F | Ht 69.0 in | Wt 198.4 lb

## 2023-04-08 DIAGNOSIS — H7091 Unspecified mastoiditis, right ear: Secondary | ICD-10-CM | POA: Diagnosis not present

## 2023-04-08 DIAGNOSIS — H66001 Acute suppurative otitis media without spontaneous rupture of ear drum, right ear: Secondary | ICD-10-CM

## 2023-04-08 MED ORDER — TRAMADOL HCL 50 MG PO TABS: 50.0000 mg | ORAL_TABLET | Freq: Three times a day (TID) | ORAL | 0 refills | Status: AC | PRN

## 2023-04-08 NOTE — Progress Notes (Signed)
Subjective:  Patient ID: Kerry Solis, male    DOB: October 11, 1945  Age: 77 y.o. MRN: [id number]  CC:  Headache/Ear pain   HPI:  77 year old male presents for evaluation the above.  Patient recently admitted to the hospital from 10/18 to 10/20.  Diagnosed with sepsis secondary to otitis and mastoiditis.  He was discharged home on Keflex and doxycycline.  Patient reports that he is having significant pain in the mastoid region.  He endorses compliance with antibiotic therapy.  He has not yet been seen by ear nose and throat.  He states that his pain was managed well in the hospital but he is now experiencing significant pain.  No fever.  No other reported symptoms.  No other complaints.  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) 05/30/2021   Glaucoma    Anxiety with depression    Memory deficits 01/14/2020   Personal history of pneumonia (recurrent) 06/19/2019   Prostate cancer (HCC) 06/27/2016   Multiple sclerosis (HCC)    Fatty liver 06/24/2015   History of colonic polyps 03/31/2015   Primary open angle glaucoma of left eye, indeterminate stage 07/17/2014   Prediabetes 12/04/2013   Mixed hyperlipidemia 03/11/2013   HIV (human immunodeficiency virus infection) (HCC) 02/14/2009   Essential hypertension 02/14/2009   Optic neuritis, left 02/16/2006    Social Hx   Social History   Socioeconomic History   Marital status: Divorced    Spouse name: Not on file   Number of children: Not on file   Years of education: Not on file   Highest education level: Not on file  Occupational  History   Not on file  Tobacco Use   Smoking status: Never   Smokeless tobacco: Never  Vaping Use   Vaping status: Never Used  Substance and Sexual Activity   Alcohol use: No   Drug use: No   Sexual activity: Never    Birth control/protection: Abstinence  Other Topics Concern   Not on file  Social History Narrative   Lives alone with dog   Daughter lives in [place]    Social Determinants of Health   Financial Resource Strain: Low Risk  (06/08/2022)   Overall Financial Resource Strain (CARDIA)    Difficulty of Paying Living Expenses: Not hard at all  Food Insecurity: No Food Insecurity (04/05/2023)   Hunger Vital Sign    Worried About Radiation protection practitioner of Food in the Last Year: Never true    Ran Out of Food in the Last Year: Never true  Transportation Needs: No Transportation Needs (04/05/2023)   PRAPARE - Administrator, Civil Service (Medical): No    Lack of Transportation (Non-Medical): No  Physical Activity: Inactive (03/05/2023)   Exercise Vital Sign    Days of Exercise per Week: 0 days    Minutes of Exercise per Session: 0 min  Stress: Stress Concern Present (03/05/2023)   [employer] of Occupational Health - Occupational Stress Questionnaire    Feeling of Stress : To some extent  Social Connections: Moderately Integrated (06/08/2022)   Social Connection and Isolation Panel [NHANES]  Frequency of Communication with Friends and Family: More than three times a week    Frequency of Social Gatherings with Friends and Family: Three times a week    Attends Religious Services: More than 4 times per year    Active Member of Clubs or Organizations: Yes    Attends Engineer, structural: More than 4 times per year    Marital Status: Divorced    Review of Systems Per HPI  Objective:  BP (!) 154/80   Pulse 82   Temp 98.4 F (36.9 C)   Ht 5\' 9"  (1.753 m)   Wt 198 lb 6.4 oz (90 kg)   SpO2 98%   BMI 29.30 kg/m      04/08/2023    2:13 PM  04/07/2023    4:52 AM 04/06/2023   10:11 PM  BP/Weight  Systolic BP 154 146 150  Diastolic BP 80 82 82  Wt. (Lbs) 198.4    BMI 29.3 kg/m2      Physical Exam Vitals and nursing note reviewed.  Constitutional:      Appearance: Normal appearance.  HENT:     Head: Normocephalic and atraumatic.     Ears:     Comments: Right TM mildly erythematous.  No effusion. Cardiovascular:     Rate and Rhythm: Normal rate and regular rhythm.  Pulmonary:     Effort: Pulmonary effort is normal.     Breath sounds: Normal breath sounds.  Neurological:     Mental Status: He is alert.     Lab Results  Component Value Date   WBC 8.5 04/06/2023   HGB 13.5 04/06/2023   HCT 43.1 04/06/2023   PLT 218 04/06/2023   GLUCOSE 120 (H) 04/06/2023   CHOL 158 12/17/2022   TRIG 133 12/17/2022   HDL 37 (L) 12/17/2022   LDLCALC 97 12/17/2022   ALT 19 04/06/2023   AST 19 04/06/2023   NA 140 04/06/2023   K 3.6 04/06/2023   CL 103 04/06/2023   CREATININE 1.25 (H) 04/06/2023   BUN 16 04/06/2023   CO2 25 04/06/2023   PSA 2.39 04/30/2014   INR 1.0 04/05/2023   HGBA1C 6.7 (H) 04/06/2023   MICROALBUR 6.55 (H) 04/14/2013     Assessment & Plan:   Problem List Items Addressed This Visit       Nervous and Auditory   Otitis media   Relevant Orders   Ambulatory referral to ENT     Musculoskeletal and Integument   Mastoiditis - Primary    Otitis and mastoiditis.  Advised to finish antibiotic course.  Tramadol as needed for pain.  Referral placed to ENT.      Relevant Orders   Ambulatory referral to ENT    Meds ordered this encounter  Medications   traMADol (ULTRAM) 50 MG tablet    Sig: Take 1 tablet (50 mg total) by mouth every 8 (eight) hours as needed for up to 5 days for moderate pain (pain score 4-6) or severe pain (pain score 7-10).    Dispense:  15 tablet    Refill:  0    Follow-up:  Return if symptoms worsen or fail to improve.  Everlene Other DO [facility]

## 2023-04-08 NOTE — Telephone Encounter (Signed)
Called patient and left vmail msg for patient to call our office to schedule an appointment for ED Follow up for middle ear infection.  Call made per Dr. Leighton Roach email request on 04/06/23.  Dr. Leighton Roach instructions were to follow up 2-3 weeks from ED visit on 04/05/23.

## 2023-04-08 NOTE — Assessment & Plan Note (Signed)
Otitis and mastoiditis.  Advised to finish antibiotic course.  Tramadol as needed for pain.  Referral placed to ENT.

## 2023-04-08 NOTE — Patient Instructions (Signed)
Finish the antibiotics.  Pain medication as needed.  Referral to ENT placed.  Take care  Dr. Adriana Simas

## 2023-04-09 ENCOUNTER — Telehealth: Payer: Self-pay

## 2023-04-09 NOTE — Patient Outreach (Signed)
Care Coordination   Note   04/09/2023 Name: Kerry Solis MRN: [id number] DOB: 1946/05/19 Return call placed to patient that I had called ENT as he requested, and made appointment for him.  Informed patient He has appointment on Apr 19, 2023@0830  am with Ashok Croon, MD.  Provided address and office number in case patient has an issue.  Patient confirmed he will go to appointment. [name] RN, BSN, CCM RN Care Manager  Transitions of Care  [facility]  [phone]

## 2023-04-09 NOTE — Transitions of Care (Post Inpatient/ED Visit) (Signed)
04/09/2023  Name: Kerry Solis MRN: [id number] DOB: [birth year]  Today's TOC FU Call Status: Today's TOC FU Call Status:: Successful TOC FU Call Completed TOC FU Call Complete Date: 04/09/23 Patient's Name and Date of Birth confirmed.  Transition Care Management Follow-up Telephone Call Date of Discharge: 04/07/23 [facility] Type of Discharge: Inpatient Admission Primary Inpatient Discharge Diagnosis:: Sepsis (Mastoiditis) How have you been since you were released from the hospital?: Better ([name]) Any questions or concerns?: No  Items Reviewed: Did you receive and understand the discharge instructions provided?: Yes Medications obtained,verified, and reconciled?: Yes (Medications Reviewed) Any new allergies since your discharge?: No Dietary orders reviewed?: No Do you have support at home?: No  Medications Reviewed Today: Medications Reviewed Today     Reviewed by Kerry Gross, RN (Case Manager) on 04/09/23 at 1526  Med List Status: <None>   Medication Order Taking? Sig Documenting Provider Last Dose Status Informant  allopurinol (ZYLOPRIM) 100 MG tablet [id number] No TAKE ONE TABLET BY MOUTH EVERY DAY FOR GOUT  Patient not taking: Reported on 04/08/2023   Kerry Sciara, MD Not Taking Active Family Member, Pharmacy Records  amitriptyline (ELAVIL) 25 MG tablet [id number] Yes Take 50 mg by mouth at bedtime. [provider] Taking Active Family Member, Pharmacy Records  amLODipine (NORVASC) 5 MG tablet [id number] Yes TAKE ONE TABLET BY MOUTH EVERY DAY HIGH BLOOD PRESSURE Solis, Kerry Coup, MD Taking Active Family Member, Pharmacy Records  cephALEXin [facility]) 500 MG capsule [id number] Yes Take 1 capsule (500 mg total) by mouth 3 (three) times daily for 5 days. Kerry Hale, MD Taking Active   DESCOVY 200-25 MG tablet [id number] Yes Take 1 tablet by mouth daily. [provider] Taking Active Family  Member, Pharmacy Records  doxycycline (VIBRA-TABS) 100 MG tablet [id number] Yes Take 1 tablet (100 mg total) by mouth 2 (two) times daily for 5 days. Kerry Hale, MD Taking Active   ezetimibe (ZETIA) 10 MG tablet [id number] Yes Take 1 tablet (10 mg total) by mouth daily. Kerry Sciara, MD Taking Active Family Member, Pharmacy Records  fluticasone [facility]) 50 MCG/ACT nasal spray [id number] Yes Place 2 sprays into both nostrils daily. Kerry Solis, Kerry A, PA-C Taking Active   gabapentin (NEURONTIN) 100 MG capsule [id number] No Take 1 capsule by mouth 3 (three) times daily.  Patient not taking: Reported on 04/08/2023   [provider] Not Taking Active Family Member, Pharmacy Records  metFORMIN (GLUCOPHAGE-XR) 500 MG 24 hr tablet [id number] Yes Take 1 tablet (500 mg total) by mouth 2 (two) times daily with a meal. Gerda Solis, Kerry Coup, MD Taking Active Family Member, Pharmacy Records  pantoprazole (PROTONIX) 40 MG tablet [id number] Yes TAKE ONE TABLET BY MOUTH EVERY DAY Kerry Sciara, MD Taking Active Family Member, Pharmacy Records  silodosin (RAPAFLO) 8 MG CAPS capsule [id number] Yes Take 1 capsule (8 mg total) by mouth at bedtime. Solis, Kerry Celeste, MD Taking Active Family Member, Pharmacy Records  TIVICAY 50 MG tablet [id number] Yes Take 50 mg by mouth daily. [provider] Taking Active Family Member, Pharmacy Records  traMADol (ULTRAM) 50 MG tablet [id number] Yes Take 1 tablet (50 mg total) by mouth every 8 (eight) hours as needed for up to 5 days for moderate pain (pain score 4-6) or severe pain (pain score 7-10). Tommie Sams, DO Taking Active   Med List Note Kerry Solis, Kerry Solis 03/15/16 1244): Daughter Marga Melnick [phone] or [phone].  Home Care and Equipment/Supplies: Were Home Health Services Ordered?: No Any new equipment or medical supplies ordered?: No  Functional Questionnaire: Do you need assistance with bathing/showering or  dressing?: No Do you need assistance with meal preparation?: No Do you need assistance with eating?: No Do you have difficulty maintaining continence: No Do you need assistance with getting out of bed/getting out of a chair/moving?: No Do you have difficulty managing or taking your medications?: No  Follow up appointments reviewed: PCP Follow-up appointment confirmed?: Yes Date of PCP follow-up appointment?: 04/08/23 Follow-up Provider: Dr. Adriana Simas [facility] Follow-up appointment confirmed?: Yes Date of Specialist follow-up appointment?: 04/19/23 Follow-Up Specialty Provider:: Dr. Lawrence Marseilles Do you need transportation to your follow-up appointment?: No Do you understand care options if your condition(s) worsen?: Yes-patient verbalized understanding  SDOH Interventions Today    Flowsheet Row Most Recent Value  SDOH Interventions   Food Insecurity Interventions Intervention Not Indicated  Housing Interventions Intervention Not Indicated  Transportation Interventions Intervention Not Indicated  Utilities Interventions Intervention Not Indicated      TOC Interventions Today    Flowsheet Row Most Recent Value  TOC Interventions   TOC Interventions Discussed/Reviewed TOC Interventions Discussed, TOC Interventions Reviewed, Contacted provider for patient needs  [Patient requested this writer to call ENT office to schedule appt.  Dr. Andrez Grime RN, BSN, CCM RN Care Manager  Transitions of Care  [place] [facility]  [phone]

## 2023-04-10 LAB — CULTURE, BLOOD (ROUTINE X 2)

## 2023-04-11 ENCOUNTER — Telehealth: Payer: Self-pay | Admitting: Family Medicine

## 2023-04-11 NOTE — Telephone Encounter (Signed)
Patient left message on voice mail can't hear out of right ear well. Please advise

## 2023-04-12 NOTE — Telephone Encounter (Signed)
Campbell Riches, NP     Has this been present the whole time with his infection? Any fever? Did he complete his antibiotic?

## 2023-04-12 NOTE — Telephone Encounter (Signed)
Telephone call- mailbox is full 

## 2023-04-15 ENCOUNTER — Telehealth: Payer: Self-pay | Admitting: Family Medicine

## 2023-04-15 NOTE — Telephone Encounter (Signed)
Patient was told to bring by his medications he was longer taking for provider to review. Patient  states you were going to review medications and change some of them.Bag of medications are in your office in the chair in [facility] bag for review.

## 2023-04-15 NOTE — Telephone Encounter (Signed)
Patient states that the pain is better. He did finish his antibiotic, no fever. Patient scheduled to see Dr Adriana Simas tomorrow.

## 2023-04-16 ENCOUNTER — Ambulatory Visit (INDEPENDENT_AMBULATORY_CARE_PROVIDER_SITE_OTHER): Payer: Medicare Other | Admitting: Family Medicine

## 2023-04-16 VITALS — BP 159/92 | HR 86 | Temp 97.8°F | Ht 69.0 in | Wt 192.6 lb

## 2023-04-16 DIAGNOSIS — H6591 Unspecified nonsuppurative otitis media, right ear: Secondary | ICD-10-CM | POA: Diagnosis not present

## 2023-04-16 DIAGNOSIS — H6590 Unspecified nonsuppurative otitis media, unspecified ear: Secondary | ICD-10-CM | POA: Insufficient documentation

## 2023-04-16 MED ORDER — AMOXICILLIN-POT CLAVULANATE 875-125 MG PO TABS: 1.0000 | ORAL_TABLET | Freq: Two times a day (BID) | ORAL | 0 refills | Status: DC

## 2023-04-16 NOTE — Assessment & Plan Note (Signed)
Treating with Augmentin.  Follow-up with ENT.

## 2023-04-16 NOTE — Patient Instructions (Addendum)
Please take all of your medications as prescribed.  Please call ENT and schedule appt. [facility] [address] [place], [place] [address] [phone]

## 2023-04-16 NOTE — Progress Notes (Signed)
Subjective:  Patient ID: Kerry Solis, male    DOB: [birth year]  Age: 77 y.o. MRN: [id number]  CC: Difficulty hearing out of the right ear   HPI:  77 year old male with the below mentioned medical history presents for evaluation of the above.  Patient recently admitted for mastoiditis and otitis.  Was placed on antibiotic therapy.  Was seen here in follow-up on 10/21.  He has now completed his antibiotics.  He states that he has difficulty hearing out of the right ear.  States that it throbs.  No significant pain at this time.  He has not been seen by ENT yet.  Has follow-up Friday.  Additionally, patient has not been taking his regular/chronic medications.  There has been some miscommunication.  Advised him that he needs to resume all of his regular medications.  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) 05/30/2021   Glaucoma    Anxiety with depression    Memory deficits 01/14/2020   Personal history of pneumonia (recurrent) 06/19/2019   Prostate cancer (HCC) 06/27/2016   Multiple sclerosis (HCC)    Fatty liver 06/24/2015   History of colonic polyps 03/31/2015   Primary open angle glaucoma of left eye, indeterminate stage 07/17/2014   Prediabetes 12/04/2013   Mixed hyperlipidemia 03/11/2013   HIV (human immunodeficiency virus infection) (HCC) 02/14/2009   Essential hypertension 02/14/2009   Optic neuritis, left 02/16/2006    Social Hx   Social History   Socioeconomic History   Marital status: Divorced    Spouse name: Not on file   Number of children: Not on file   Years of education: Not on  file   Highest education level: Not on file  Occupational History   Not on file  Tobacco Use   Smoking status: Never   Smokeless tobacco: Never  Vaping Use   Vaping status: Never Used  Substance and Sexual Activity   Alcohol use: No   Drug use: No   Sexual activity: Never    Birth control/protection: Abstinence  Other Topics Concern   Not on file  Social History Narrative   Lives alone with dog   Daughter lives in [place]    Social Determinants of Health   Financial Resource Strain: Low Risk  (06/08/2022)   Overall Financial Resource Strain (CARDIA)    Difficulty of Paying Living Expenses: Not hard at all  Food Insecurity: No Food Insecurity (04/09/2023)   Hunger Vital Sign    Worried About Running Out of Food in the Last Year: Never true    Ran Out of Food in the Last Year: Never true  Transportation Needs: No Transportation Needs (04/09/2023)   PRAPARE - Administrator, Civil Service (Medical): No    Lack of Transportation (Non-Medical): No  Physical Activity: Inactive (03/05/2023)   Exercise Vital Sign    Days of Exercise per Week: 0 days    Minutes of Exercise per Session: 0 min  Stress: Stress Concern Present (03/05/2023)   [employer] of Occupational Health - Occupational Stress Questionnaire    Feeling of Stress : To some extent  Social Connections: Moderately Integrated (  06/08/2022)   Social Connection and Isolation Panel [NHANES]    Frequency of Communication with Friends and Family: More than three times a week    Frequency of Social Gatherings with Friends and Family: Three times a week    Attends Religious Services: More than 4 times per year    Active Member of Clubs or Organizations: Yes    Attends Engineer, structural: More than 4 times per year    Marital Status: Divorced    Review of Systems Per HPI  Objective:  BP (!) 159/92   Pulse 86   Temp 97.8 F (36.6 C) (Oral)   Ht 5\' 9"  (1.753 m)   Wt 192 lb 9.6 oz (87.4 kg)    SpO2 100%   BMI 28.44 kg/m      04/16/2023    2:02 PM 04/08/2023    2:13 PM 04/07/2023    4:52 AM  BP/Weight  Systolic BP 159 154 146  Diastolic BP 92 80 82  Wt. (Lbs) 192.6 198.4   BMI 28.44 kg/m2 29.3 kg/m2     Physical Exam Vitals and nursing note reviewed.  Constitutional:      General: He is not in acute distress. HENT:     Head: Normocephalic and atraumatic.     Ears:     Comments: Right TM with effusion. Cardiovascular:     Rate and Rhythm: Normal rate and regular rhythm.  Pulmonary:     Effort: Pulmonary effort is normal.     Breath sounds: Normal breath sounds.  Neurological:     Mental Status: He is alert.     Lab Results  Component Value Date   WBC 8.5 04/06/2023   HGB 13.5 04/06/2023   HCT 43.1 04/06/2023   PLT 218 04/06/2023   GLUCOSE 120 (H) 04/06/2023   CHOL 158 12/17/2022   TRIG 133 12/17/2022   HDL 37 (L) 12/17/2022   LDLCALC 97 12/17/2022   ALT 19 04/06/2023   AST 19 04/06/2023   NA 140 04/06/2023   K 3.6 04/06/2023   CL 103 04/06/2023   CREATININE 1.25 (H) 04/06/2023   BUN 16 04/06/2023   CO2 25 04/06/2023   PSA 2.39 04/30/2014   INR 1.0 04/05/2023   HGBA1C 6.7 (H) 04/06/2023   MICROALBUR 6.55 (H) 04/14/2013     Assessment & Plan:   Problem List Items Addressed This Visit       Nervous and Auditory   Middle ear effusion - Primary    Treating with Augmentin.  Follow-up with ENT.       Meds ordered this encounter  Medications   amoxicillin-clavulanate (AUGMENTIN) 875-125 MG tablet    Sig: Take 1 tablet by mouth 2 (two) times daily.    Dispense:  20 tablet    Refill:  0    Yolander Goodie DO [facility]

## 2023-04-18 NOTE — Telephone Encounter (Signed)
Medications were sent back to the front desk patient will do a follow-up office visit with me and bring all of his medicines to the, resume all medicines currently

## 2023-04-19 ENCOUNTER — Institutional Professional Consult (permissible substitution) (INDEPENDENT_AMBULATORY_CARE_PROVIDER_SITE_OTHER): Payer: Medicare Other

## 2023-04-19 ENCOUNTER — Institutional Professional Consult (permissible substitution) (INDEPENDENT_AMBULATORY_CARE_PROVIDER_SITE_OTHER): Payer: Medicare Other | Admitting: Otolaryngology

## 2023-04-23 ENCOUNTER — Ambulatory Visit: Payer: Self-pay | Admitting: Licensed Clinical Social Worker

## 2023-04-23 NOTE — Patient Outreach (Signed)
  Care Coordination   04/23/2023 Name: Kerry Solis MRN: [id number] DOB: 1945/07/07   Care Coordination Outreach Attempts:  An unsuccessful telephone outreach was attempted today to offer the patient information about available care coordination services.  Follow Up Plan:  Additional outreach attempts will be made to offer the patient care coordination information and services.   Encounter Outcome:  No Answer   Care Coordination Interventions:  No, not indicated    Kelton Pillar.Nazire Fruth MSW, LCSW Licensed Visual merchandiser [facility] Care Management [phone]

## 2023-05-08 ENCOUNTER — Ambulatory Visit: Payer: Self-pay | Admitting: Licensed Clinical Social Worker

## 2023-05-08 NOTE — Patient Instructions (Signed)
Visit Information  Thank you for taking time to visit with me today. Please don't hesitate to contact me if I can be of assistance to you.   Following are the goals we discussed today:   Goals Addressed             This Visit's Progress    Patient Stated he gets sad sometimes due to inability to do activities he used to do       Interventions:  Spoke with client via phone  today about his current needs Client has support of his granddaughter and of his nephews. Client has daughter who lives in [place], [place]. His daughter calls him frequently Spoke of transportation needs. Client said he drives for short trips.  He only drives short distances from his home Spoke of medication procurement Discussed energy level of client. He fatigues occasionally and has to take rest breaks as needed. He has talked with PCP about his decreased energy [name] Discussed program support with RN, LCSW, Pharmacist Provided counseling support for client Discussed Medicaid status of client. Client said he has appointment today at DSS to talk with DSS representative about his current Medicaid status Discussed sleeping issues. Client said he sometimes has difficulty sleeping Encouraged client to access program support as needed. Encouraged Onalee Hua to call LCSW as needed for SW support  at [phone].           Our next appointment is by telephone on 06/24/23 at 10:00 AM   Please call the care guide team at [phone] if you need to cancel or reschedule your appointment.   If you are experiencing a Mental Health or Behavioral Health Crisis or need someone to talk to, please go to [facility] [address] [place] [phone])   The patient verbalized understanding of instructions, educational materials, and care plan provided today and DECLINED offer to receive copy of patient instructions, educational materials, and care plan.   The  patient has been provided with contact information for the care management team and has been advised to call with any health related questions or concerns.   Kelton Pillar.Amoree Newlon MSW, LCSW Licensed Visual merchandiser [facility] Care Management [phone]

## 2023-05-08 NOTE — Patient Outreach (Signed)
  Care Coordination   Follow Up Visit Note   05/08/2023 Name: Kerry Solis MRN: [id number] DOB: [birth year]  Kerry Solis is a 77 y.o. year old male who sees Luking, Jonna Coup, MD for primary care. I spoke with  Kerry Solis by phone today.  What matters to the patients health and wellness today?  Patient Stated he gets sad sometimes due to inability to do activities he used to do    Goals Addressed             This Visit's Progress    Patient Stated he gets sad sometimes due to inability to do activities he used to do       Interventions:  Spoke with client via phone  today about his current needs Client has support of his granddaughter and of his nephews. Client has daughter who lives in [place], [place]. His daughter calls him frequently Spoke of transportation needs. Client said he drives for short trips.  He only drives short distances from his home Spoke of medication procurement Discussed energy level of client. He fatigues occasionally and has to take rest breaks as needed. He has talked with PCP about his decreased energy [name] Discussed program support with RN, LCSW, Pharmacist Provided counseling support for client Discussed Medicaid status of client. Client said he has appointment today at DSS to talk with DSS representative about his current Medicaid status Discussed sleeping issues. Client said he sometimes has difficulty sleeping Encouraged client to access program support as needed. Encouraged Kerry Solis to call LCSW as needed for SW support  at [phone].           SDOH assessments and interventions completed:  Yes  SDOH Interventions Today    Flowsheet Row Most Recent Value  SDOH Interventions   Depression Interventions/Treatment  Counseling  Physical Activity Interventions Other (Comments)  [some mobility issues]  Stress Interventions Provide Counseling  [has stress related to managing medical needs]        Care  Coordination Interventions:  Yes, provided  Interventions Today    Flowsheet Row Most Recent Value  Chronic Disease   Chronic disease during today's visit Other  [spoke with client about client needs]  General Interventions   General Interventions Discussed/Reviewed General Interventions Discussed, Community Resources  Education Interventions   Education Provided Provided Education  Provided Verbal Education On [employer]  Nutrition Interventions   Nutrition Discussed/Reviewed Nutrition Discussed  Pharmacy Interventions   Pharmacy Dicussed/Reviewed Pharmacy Topics Discussed        Follow up plan: Follow up call scheduled for 06/24/23 at 10:00 AM    Encounter Outcome:  Patient Visit Completed   Kelton Pillar.Errin Whitelaw MSW, LCSW Licensed Visual merchandiser [facility] Care Management [phone]

## 2023-05-14 ENCOUNTER — Ambulatory Visit: Payer: Medicare Other | Admitting: Family Medicine

## 2023-05-15 ENCOUNTER — Ambulatory Visit (INDEPENDENT_AMBULATORY_CARE_PROVIDER_SITE_OTHER): Payer: Medicare Other | Admitting: Family Medicine

## 2023-05-15 ENCOUNTER — Other Ambulatory Visit: Payer: Self-pay

## 2023-05-15 VITALS — BP 132/76 | HR 74 | Temp 98.2°F | Ht 69.0 in | Wt 195.0 lb

## 2023-05-15 DIAGNOSIS — E785 Hyperlipidemia, unspecified: Secondary | ICD-10-CM | POA: Diagnosis not present

## 2023-05-15 DIAGNOSIS — G35 Multiple sclerosis: Secondary | ICD-10-CM | POA: Diagnosis not present

## 2023-05-15 DIAGNOSIS — Z23 Encounter for immunization: Secondary | ICD-10-CM | POA: Diagnosis not present

## 2023-05-15 DIAGNOSIS — E1169 Type 2 diabetes mellitus with other specified complication: Secondary | ICD-10-CM

## 2023-05-15 DIAGNOSIS — H9191 Unspecified hearing loss, right ear: Secondary | ICD-10-CM

## 2023-05-15 DIAGNOSIS — I10 Essential (primary) hypertension: Secondary | ICD-10-CM | POA: Diagnosis not present

## 2023-05-15 DIAGNOSIS — B20 Human immunodeficiency virus [HIV] disease: Secondary | ICD-10-CM

## 2023-05-15 DIAGNOSIS — G720 Drug-induced myopathy: Secondary | ICD-10-CM | POA: Diagnosis not present

## 2023-05-15 NOTE — Progress Notes (Signed)
   Subjective:    Patient ID: Kerry Solis, male    DOB: 1946/04/13, 77 y.o.   MRN: [id number]  HPI Patient presents for follow up Brings all of his medicines Meds reviewed in detail Has underlying history of MS hyperlipidemia hypertension HIV Sees MS specialist at [facility] sees HIV specialist at [facility] to stay physically active Does have some degree of cognitive dysfunction along with short-term memory issues He states he has a family member who helps him out during the day but he prefers to be at home by himself    Review of Systems     Objective:   Physical Exam  General-in no acute distress Eyes-no discharge Lungs-respiratory rate normal, CTA CV-no murmurs,RRR Extremities skin warm dry no edema Neuro grossly normal Behavior normal, alert       Assessment & Plan:  1. Immunization due Today - Flu Vaccine Trivalent High Dose (Fluad)  2. Multiple sclerosis (HCC) He was on amitriptyline by his MS doctor 50 mg each evening given his age and some mild cognitive decline related to a drug recommend reducing amitriptyline down to 25 mg each evening  3. Controlled type 2 diabetes mellitus with other specified complication, without long-term current use of insulin (HCC) Dietary measures to control follow A1c check labs before next visit  4. Essential hypertension Metabolic 7 before next visit continue current blood pressure medicine  5. Hyperlipidemia associated with type 2 diabetes mellitus (HCC) continue Zetia does not tolerate statins due to myalgia Patient had myalgia along with elevated CK previously  6. Currently asymptomatic HIV infection, with history of HIV-related illness (HCC) Continue current measures  Patient to follow-up within 4 months

## 2023-05-15 NOTE — Patient Instructions (Signed)
Reduce

## 2023-05-27 ENCOUNTER — Encounter (INDEPENDENT_AMBULATORY_CARE_PROVIDER_SITE_OTHER): Payer: Self-pay | Admitting: Otolaryngology

## 2023-05-27 DIAGNOSIS — G35 Multiple sclerosis: Secondary | ICD-10-CM | POA: Diagnosis not present

## 2023-05-28 ENCOUNTER — Institutional Professional Consult (permissible substitution) (INDEPENDENT_AMBULATORY_CARE_PROVIDER_SITE_OTHER): Payer: Medicare Other

## 2023-05-28 ENCOUNTER — Ambulatory Visit (INDEPENDENT_AMBULATORY_CARE_PROVIDER_SITE_OTHER): Payer: Medicare Other | Admitting: Audiology

## 2023-05-29 ENCOUNTER — Other Ambulatory Visit: Payer: Self-pay | Admitting: Family Medicine

## 2023-06-14 ENCOUNTER — Ambulatory Visit (INDEPENDENT_AMBULATORY_CARE_PROVIDER_SITE_OTHER): Payer: Medicare Other

## 2023-06-14 DIAGNOSIS — Z0000 Encounter for general adult medical examination without abnormal findings: Secondary | ICD-10-CM

## 2023-06-14 NOTE — Patient Instructions (Signed)
Kerry Solis , Thank you for taking time to come for your Medicare Wellness Visit. I appreciate your ongoing commitment to your health goals. Please review the following plan we discussed and let me know if I can assist you in the future.   Referrals/Orders/Follow-Ups/Clinician Recommendations: none  This is a list of the screening recommended for you and due dates:  Health Maintenance  Topic Date Due   DTaP/Tdap/Td vaccine (1 - Tdap) Never done   Eye exam for diabetics  09/17/2018   Complete foot exam   03/14/2023   Hemoglobin A1C  10/05/2023   Yearly kidney health urinalysis for diabetes  12/17/2023   Yearly kidney function blood test for diabetes  04/05/2024   Medicare Annual Wellness Visit  06/13/2024   Pneumonia Vaccine  Completed   Flu Shot  Completed   Hepatitis C Screening  Completed   Zoster (Shingles) Vaccine  Completed   HPV Vaccine  Aged Out   Colon Cancer Screening  Discontinued   COVID-19 Vaccine  Discontinued    Advanced directives: (Copy Requested) Please bring a copy of your health care power of attorney and living will to the office to be added to your chart at your convenience.  Next Medicare Annual Wellness Visit scheduled for next year: Yes  insert Preventive Care attachment Insert FALL PREVENTION attachment if needed

## 2023-06-14 NOTE — Progress Notes (Signed)
Subjective:   CLATE MACKERT is a 77 y.o. male who presents for Medicare Annual/Subsequent preventive examination.  Visit Complete: Virtual I connected with  [name] on 06/14/23 by a audio enabled telemedicine application and verified that I am speaking with the correct person using two identifiers.  Patient Location: Home  Provider Location: Office/Clinic  I discussed the limitations of evaluation and management by telemedicine. The patient expressed understanding and agreed to proceed.  Vital Signs: Because this visit was a virtual/telehealth visit, some criteria may be missing or patient reported. Any vitals not documented were not able to be obtained and vitals that have been documented are patient reported.    Cardiac Risk Factors include: advanced age (>39men, >22 women);diabetes mellitus;dyslipidemia;hypertension;male gender     Objective:    Today's Vitals   06/14/23 1126  PainSc: 8    There is no height or weight on file to calculate BMI.     06/14/2023   11:40 AM 04/05/2023    9:55 PM 04/05/2023    3:30 PM 01/16/2023    1:01 PM 06/08/2022   11:45 AM 09/04/2021    4:11 AM 05/30/2021    1:26 PM  Advanced Directives  Does Patient Have a Medical Advance Directive? Yes Yes No;Yes No Yes No Yes  Type of Estate agent of [place];Living will Healthcare Power of [place];Living will Healthcare Power of [place];Living will  Living will;Healthcare Power of [employer] Power of [place];Living will  Does patient want to make changes to medical advance directive?  No - Patient declined   No - Patient declined    Copy of Healthcare Power of Attorney in Chart? No - copy requested No - copy requested   No - copy requested  No - copy requested  Would patient like information on creating a medical advance directive?    No - Patient declined  No - Patient declined No - Patient declined    Current Medications (verified) Outpatient Encounter  Medications as of 06/14/2023  Medication Sig   allopurinol (ZYLOPRIM) 100 MG tablet TAKE ONE TABLET BY MOUTH EVERY DAY FOR GOUT   amitriptyline (ELAVIL) 25 MG tablet Take 50 mg by mouth at bedtime.   amLODipine (NORVASC) 5 MG tablet TAKE ONE TABLET BY MOUTH EVERY DAY HIGH BLOOD PRESSURE   DESCOVY 200-25 MG tablet Take 1 tablet by mouth daily.   ezetimibe (ZETIA) 10 MG tablet TAKE ONE TABLET BY MOUTH ONCE DAILY   metFORMIN (GLUCOPHAGE-XR) 500 MG 24 hr tablet TAKE ONE TABLET BY MOUTH TWICE DAILY WITH A MEAL   pantoprazole (PROTONIX) 40 MG tablet TAKE ONE TABLET BY MOUTH EVERY DAY   silodosin (RAPAFLO) 8 MG CAPS capsule Take 1 capsule (8 mg total) by mouth at bedtime.   fluticasone (FLONASE) 50 MCG/ACT nasal spray Place 2 sprays into both nostrils daily. (Patient not taking: Reported on 06/14/2023)   gabapentin (NEURONTIN) 100 MG capsule Take 1 capsule by mouth 3 (three) times daily. (Patient not taking: Reported on 05/15/2023)   TIVICAY 50 MG tablet Take 50 mg by mouth daily. (Patient not taking: Reported on 06/14/2023)   No facility-administered encounter medications on file as of 06/14/2023.    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   Depression    Elevated liver enzymes    Glaucoma  High triglycerides    History of gout    HIV (human immunodeficiency virus infection) (HCC)    Hypertension    Leukopenia    Low CD4   Multiple sclerosis (HCC)    uses a cane   Myocardial infarction (HCC)    45 years ago   Prostate cancer [facility])    Past Surgical History:  Procedure Laterality Date   BIOPSY  08/14/2016   Procedure: BIOPSY;  Surgeon: West Bali, MD;  Location[facility];  Service: Endoscopy;;  gastric bx's   CHOLECYSTECTOMY  06/10/2011   Procedure: LAPAROSCOPIC CHOLECYSTECTOMY;  Surgeon: Fabio Bering, MD;  Location:  [facility];  Service: General;  Laterality: N/A;   COLONOSCOPY  03/10/2009   [id number] internal hemorrhoids/6-mm sessile ascending colon polyp/tortuous colon, diverticulosis. TA   COLONOSCOPY WITH PROPOFOL N/A 04/19/2015   Dr. Darrick Penna: sessile serrated adenoma, surveillance in 5 years    ESOPHAGOGASTRODUODENOSCOPY  06/23/2008   [id number] gastritis/schatzki ring   ESOPHAGOGASTRODUODENOSCOPY (EGD) WITH PROPOFOL N/A 08/14/2016   Dr. Darrick Penna: moderate Schatzi's ring s/p dilation, LA Grade A esophagitis,small hiatal hernia, chronic gastritis (negative H.pylori), one duodenal diverticulum   LAPAROSCOPIC APPENDECTOMY N/A 12/13/2017   Procedure: APPENDECTOMY LAPAROSCOPIC;  Surgeon: Franky Macho, MD;  Location: [facility] ORS;  Service: General;  Laterality: N/A;   NECK SURGERY  unsure   disc   POLYPECTOMY N/A 04/19/2015   Procedure: POLYPECTOMY;  Surgeon: West Bali, MD;  Location: [facility];  Service: Endoscopy;  Laterality: N/A;  ascending colon   PROSTATE BIOPSY     RADIOACTIVE SEED IMPLANT N/A 11/29/2016   Procedure: RADIOACTIVE SEED IMPLANT/BRACHYTHERAPY IMPLANT;  Surgeon: Marcine Matar, MD;  Location: [facility];  Service: Urology;  Laterality: N/A;  81 seeds implanted   SAVORY DILATION N/A 08/14/2016   Procedure: SAVORY DILATION;  Surgeon: West Bali, MD;  [facility];  Service: Endoscopy;  Laterality: N/A;   Family History  Problem Relation Age of Onset   Hypertension Mother    Heart attack Mother    Heart attack Father    Cancer Sister        Breast   Diabetes Sister    Diabetes Brother    Cancer Brother        unknown   Cancer Brother        colon   Cancer Sister        breast   Cancer Brother        prostate   Cancer Brother        colon   Social History   Socioeconomic History   Marital status: Divorced    Spouse name: Not on file   Number of children: Not on file   Years of education: Not on file   Highest education level: Not on file   Occupational History   Not on file  Tobacco Use   Smoking status: Never   Smokeless tobacco: Never  Vaping Use   Vaping status: Never Used  Substance and Sexual Activity   Alcohol use: No   Drug use: No   Sexual activity: Never    Birth control/protection: Abstinence  Other Topics Concern   Not on file  Social History Narrative   Lives alone with dog   Daughter lives in [place]    Social Drivers of Health   Financial Resource Strain: Low Risk  (06/14/2023)   Overall Financial Resource Strain (CARDIA)    Difficulty of Paying Living Expenses: Not hard at all  Food Insecurity: No Food Insecurity (06/14/2023)   Hunger Vital Sign    Worried About Running Out of Food in the Last Year: Never true    Ran Out of Food in the Last Year: Never true  Transportation Needs: No Transportation Needs (06/14/2023)   PRAPARE - Administrator, Civil Service (Medical): No    Lack of Transportation (Non-Medical): No  Physical Activity: Inactive (06/14/2023)   Exercise Vital Sign    Days of Exercise per Week: 0 days    Minutes of Exercise per Session: 0 min  Stress: Stress Concern Present (06/14/2023)   [employer] of Occupational Health - Occupational Stress Questionnaire    Feeling of Stress : To some extent  Social Connections: Moderately Isolated (06/14/2023)   Social Connection and Isolation Panel [NHANES]    Frequency of Communication with Friends and Family: Twice a week    Frequency of Social Gatherings with Friends and Family: Once a week    Attends Religious Services: More than 4 times per year    Active Member of [employer] or Organizations: No    Attends Engineer, structural: Never    Marital Status: Divorced    Tobacco Counseling Counseling given: Not Answered   Clinical Intake:  Pre-visit preparation completed: Yes  Pain : 0-10 Pain Score: 8  Pain Type: Chronic pain Pain Location: Generalized Pain Descriptors / Indicators: Aching Pain Onset:  More than a month ago Pain Frequency: Constant     Nutritional Risks: None Diabetes: Yes CBG done?: No Did pt. bring in CBG monitor from home?: No  How often do you need to have someone help you when you read instructions, pamphlets, or other written materials from your doctor or pharmacy?: 1 - Never  Interpreter Needed?: No  Information entered by :: NAllen LPN   Activities of Daily Living    06/14/2023   11:29 AM 04/05/2023    9:55 PM  In your present state of health, do you have any difficulty performing the following activities:  Hearing? 1 0  Comment trouble with right ear   Vision? 1 0  Comment trouble with small print   Difficulty concentrating or making decisions? 1 0  Walking or climbing stairs? 1   Dressing or bathing? 1   Comment has to do slowly   Doing errands, shopping? 0 0  Preparing Food and eating ? N   Using the Toilet? N   In the past six months, have you accidently leaked urine? N   Do you have problems with loss of bowel control? N   Managing your Medications? N   Comment pre packaged   Managing your Finances? N   Housekeeping or managing your Housekeeping? Y     Patient Care Team: Babs Sciara, MD as PCP - General (Family Medicine) West Bali, MD (Inactive) as Consulting Physician (Gastroenterology) Lanelle Bal, DO as Consulting Physician (Internal Medicine) Audrie Gallus, RN as [facility] Care Management Harlen Labs, MD as Referring Physician (Neurology) Ronne Binning Mardene Celeste, MD as Consulting Physician (Urology) Alspaugh, Havery Moros, MD as Referring Physician (Infectious Diseases) Genelle Gather, OD (Optometry) Randa Spike Kelton Pillar, LCSW as [facility] Care Management (Licensed Clinical Social Worker)  Indicate any recent Medical Services you may have received from other than [facility] providers in the past year (date may be approximate).     Assessment:   This is a routine wellness examination for  Daoud.  Hearing/Vision screen Hearing Screening - Comments::  Trouble hearing right ear Vision Screening - Comments:: No regular eye exams, [facility]   Goals Addressed             This Visit's Progress    Patient Stated       06/14/2023, denies goals       Depression Screen    06/14/2023   11:43 AM 05/15/2023    9:33 AM 05/08/2023    9:24 AM 04/08/2023    2:26 PM 03/05/2023   12:06 PM 02/11/2023   11:02 AM 01/30/2023   11:35 AM  PHQ 2/9 Scores  PHQ - 2 Score 3 4 2 3 2 4 2   PHQ- 9 Score 17 14 8 14 9 11 9     Fall Risk    06/14/2023   11:41 AM 04/16/2023    2:01 PM 04/08/2023    2:25 PM 02/11/2023   11:02 AM 01/23/2023   11:21 AM  Fall Risk   Falls in the past year? 0 0 0 0 0  Number falls in past yr: 0 0  1 0  Injury with Fall? 0 0  0 0  Risk for fall due to : Medication side effect;Impaired mobility;Impaired balance/gait    No Fall Risks  Follow up Falls prevention discussed;Falls evaluation completed    Falls evaluation completed    MEDICARE RISK AT HOME: Medicare Risk at Home Any stairs in or around the home?: Yes If so, are there any without handrails?: No Home free of loose throw rugs in walkways, pet beds, electrical cords, etc?: Yes Adequate lighting in your home to reduce risk of falls?: Yes Life alert?: No Use of a cane, walker or w/c?: Yes Grab bars in the bathroom?: Yes Shower chair or bench in shower?: No Elevated toilet seat or a handicapped toilet?: No  TIMED UP AND GO:  Was the test performed?  No    Cognitive Function:      01/23/2020    6:39 AM  Montreal Cognitive Assessment   Visuospatial/ Executive (0/5) 2  Naming (0/3) 3  Attention: Read list of digits (0/2) 2  Attention: Read list of letters (0/1) 1  Attention: Serial 7 subtraction starting at 100 (0/3) 1  Language: Repeat phrase (0/2) 2  Language : Fluency (0/1) 1  Abstraction (0/2) 1  Delayed Recall (0/5) 0  Orientation (0/6) 3  Total 16  Adjusted Score (based on  education) 17      06/14/2023   11:47 AM 06/08/2022   11:45 AM 05/30/2021    1:33 PM  6CIT Screen  What Year? 0 points 0 points 0 points  What month? 0 points 0 points 0 points  What time? 0 points 0 points 0 points  Count back from 20 0 points 0 points 0 points  Months in reverse 4 points 2 points 4 points  Repeat phrase 4 points 2 points 2 points  Total Score 8 points 4 points 6 points    Immunizations Immunization History  Administered Date(s) Administered   Fluad Quad(high Dose 65+) 03/14/2020, 04/11/2020, 03/07/2021, 03/13/2022   Fluad Trivalent(High Dose 65+) 05/15/2023   Hep A / Hep B 08/09/2010   Influenza Whole 03/19/2011   Influenza,inj,Quad PF,6+ Mos 03/11/2013, 04/30/2014, 03/08/2015, 03/23/2016, 02/28/2017, 03/14/2018, 03/19/2019   Influenza-Unspecified 04/02/2012, 03/17/2013, 02/24/2014, 03/16/2015, 03/29/2016   Moderna Sars-Covid-2 Vaccination 08/18/2019, 09/22/2019   Pneumococcal Conjugate-13 01/25/2014, 03/30/2015   Pneumococcal Polysaccharide-23 03/19/2011   Zoster Recombinant(Shingrix) 12/05/2018, 02/02/2019    TDAP status: Due, Education has been provided regarding the  importance of this vaccine. Advised may receive this vaccine at local pharmacy or Health Dept. Aware to provide a copy of the vaccination record if obtained from local pharmacy or Health Dept. Verbalized acceptance and understanding.  Flu Vaccine status: Up to date  Pneumococcal vaccine status: Up to date  Covid-19 vaccine status: Information provided on how to obtain vaccines.   Qualifies for Shingles Vaccine? Yes   Zostavax completed Yes   Shingrix Completed?: Yes  Screening Tests Health Maintenance  Topic Date Due   DTaP/Tdap/Td (1 - Tdap) Never done   OPHTHALMOLOGY EXAM  09/17/2018   FOOT EXAM  03/14/2023   HEMOGLOBIN A1C  10/05/2023   Diabetic kidney evaluation - Urine ACR  12/17/2023   Diabetic kidney evaluation - eGFR measurement  04/05/2024   Medicare Annual Wellness  (AWV)  06/13/2024   Pneumonia Vaccine 32+ Years old  Completed   INFLUENZA VACCINE  Completed   Hepatitis C Screening  Completed   Zoster Vaccines- Shingrix  Completed   HPV VACCINES  Aged Out   Colonoscopy  Discontinued   COVID-19 Vaccine  Discontinued    Health Maintenance  Health Maintenance Due  Topic Date Due   DTaP/Tdap/Td (1 - Tdap) Never done   OPHTHALMOLOGY EXAM  09/17/2018   FOOT EXAM  03/14/2023    Colorectal cancer screening: No longer required.   Lung Cancer Screening: (Low Dose CT Chest recommended if Age 97-80 years, 20 pack-year currently smoking OR have quit w/in 15years.) does not qualify.   Lung Cancer Screening Referral: no  Additional Screening:  Hepatitis C Screening: does qualify; Completed 10/06/2015  Vision Screening: Recommended annual ophthalmology exams for early detection of glaucoma and other disorders of the eye. Is the patient up to date with their annual eye exam?  No  Who is the provider or what is the name of the office in which the patient attends annual eye exams? [facility] If pt is not established with a provider, would they like to be referred to a provider to establish care? No .   Dental Screening: Recommended annual dental exams for proper oral hygiene  Diabetic Foot Exam: Diabetic Foot Exam: Overdue, Pt has been advised about the importance in completing this exam. Pt is scheduled for diabetic foot exam on next appointment.  Community Resource Referral / Chronic Care Management: CRR required this visit?  No   CCM required this visit?  No     Plan:     I have personally reviewed and noted the following in the patient's chart:   Medical and social history Use of alcohol, tobacco or illicit drugs  Current medications and supplements including opioid prescriptions. Patient is not currently taking opioid prescriptions. Functional ability and status Nutritional status Physical activity Advanced directives List of other  physicians Hospitalizations, surgeries, and ER visits in previous 12 months Vitals Screenings to include cognitive, depression, and falls Referrals and appointments  In addition, I have reviewed and discussed with patient certain preventive protocols, quality metrics, and best practice recommendations. A written personalized care plan for preventive services as well as general preventive health recommendations were provided to patient.     Barb Merino, LPN   [id number]   After Visit Summary: (Pick Up) Due to this being a telephonic visit, with patients personalized plan was offered to patient and patient has requested to Pick up at office.  Nurse Notes: none

## 2023-06-24 ENCOUNTER — Ambulatory Visit: Payer: Self-pay | Admitting: Licensed Clinical Social Worker

## 2023-06-24 NOTE — Patient Instructions (Signed)
 Visit Information  Thank you for taking time to visit with me today. Please don't hesitate to contact me if I can be of assistance to you.   Following are the goals we discussed today:   Goals Addressed             This Visit's Progress    Patient Stated he gets sad sometimes due to inability to do activities he used to do       Interventions:  Spoke with client via phone  today about his current needs and status Client said he sometimes has balance issues. He does use a cane to help him walk Client has support of his granddaughter and of his nephews. Client has daughter who lives in [place], [place] . His daughter calls him frequently Spoke of transportation needs. Client said he drives for short trips.  He only drives short distances from his home Spoke of upcoming appointments for client. He said he was having pain issue in his right ear. Client said he has appointment this Wednesday for ear exam in [place], [place] Discussed medication procurement of client [name] Spoke with client about his last appointment with neurologist at [facility]. Client said he was having some numbness in his toes.  He said he forgot to share this information with MD (neurologist). LCSW suggested that client could call office of neurologist at [facility] and let them know of his numbness in his toes.  Client said he would call that office and inform them of numbness in his toes.   Spoke with client about HHPT and HHNSG support. Client said he is still waiting to hear from agency regarding these services for client in the home  Provided counseling support for client Client said he has decreased sleep. Client uses [employer] as needed Thanked client for phone call with LCSW today. Client was appreciative of call today from LCSW Encouraged client to access program support as needed. Encouraged Alm to call LCSW as needed for SW support at [phone].           Our next  appointment is by telephone on 08/20/23 at 11:00 AM   Please call the care guide team at [phone] if you need to cancel or reschedule your appointment.   If you are experiencing a Mental Health or Behavioral Health Crisis or need someone to talk to, please go to [facility] [address] [place] [phone])   The patient verbalized understanding of instructions, educational materials, and care plan provided today and DECLINED offer to receive copy of patient instructions, educational materials, and care plan.   The patient has been provided with contact information for the care management team and has been advised to call with any health related questions or concerns.   Ozell RAMAN.Verdis Koval MSW, LCSW Licensed Visual Merchandiser [facility] Care Management [phone]

## 2023-06-24 NOTE — Patient Outreach (Signed)
 Care Coordination   Follow Up Visit Note   06/24/2023 Name: Kerry Solis MRN: [id number] DOB: 06/15/1946  Kerry Solis is a 78 y.o. year old male who sees Luking, Glendia LABOR, MD for primary care. I spoke with  Kerry Solis by phone today.  What matters to the patients health and wellness today?  Patient Stated he gets sad sometimes due to inability to do activities he used to do    Goals Addressed             This Visit's Progress    Patient Stated he gets sad sometimes due to inability to do activities he used to do       Interventions:  Spoke with client via phone  today about his current needs and status Client said he sometimes has balance issues. He does use a cane to help him walk Client has support of his granddaughter and of his nephews. Client has daughter who lives in [place], [place] . His daughter calls him frequently Spoke of transportation needs. Client said he drives for short trips.  He only drives short distances from his home Spoke of upcoming appointments for client. He said he was having pain issue in his right ear. Client said he has appointment this Wednesday for ear exam in [place], [place] Discussed medication procurement of client [name] Spoke with client about his last appointment with neurologist at [facility]. Client said he was having some numbness in his toes.  He said he forgot to share this information with MD (neurologist). LCSW suggested that client could call office of neurologist at [facility] and let them know of his numbness in his toes.  Client said he would call that office and inform them of numbness in his toes.   Spoke with client about HHPT and HHNSG support. Client said he is still waiting to hear from agency regarding these services for client in the home  Provided counseling support for client Client said he has decreased sleep. Client uses [employer] as needed Thanked client for phone call with LCSW today.  Client was appreciative of call today from LCSW Encouraged client to access program support as needed. Encouraged Kerry to call LCSW as needed for SW support at [phone].           SDOH assessments and interventions completed:  Yes  SDOH Interventions Today    Flowsheet Row Most Recent Value  SDOH Interventions   Depression Interventions/Treatment  Currently on Treatment  Physical Activity Interventions Other (Comments)  [uses a cane to help him walk. risk of falls. decreased energy occasionally]  Stress Interventions Other (Comment)  [has stress in managing medical needs]        Care Coordination Interventions:  Yes, provided    Interventions Today    Flowsheet Row Most Recent Value  Chronic Disease   Chronic disease during today's visit Other  [spoke with client about client needs]  General Interventions   General Interventions Discussed/Reviewed General Interventions Discussed, Community Resources  Education Interventions   Education Provided Provided Education  Provided Verbal Education On [employer]  Mental Health Interventions   Mental Health Discussed/Reviewed Coping Strategies  [discussed coping with medical needs and receiving care needed to address medical needs]  Nutrition Interventions   Nutrition Discussed/Reviewed Nutrition Discussed  Pharmacy Interventions   Pharmacy Dicussed/Reviewed Pharmacy Topics Discussed  Safety Interventions   Safety Discussed/Reviewed Fall Risk       Follow up plan: Follow up call  scheduled for 08/20/23 at 11:00 AM    Encounter Outcome:  Patient Visit Completed   Ozell RAMAN.Rogue Pautler MSW, LCSW Licensed Visual Merchandiser [facility] Care Management [phone]

## 2023-06-26 NOTE — Progress Notes (Signed)
  [address] [place], [place] [address] [phone]  Audiological Evaluation    Name: Kerry Solis     DOB:   [birth year]      MRN:   [id number]                                                                                     Service Date: 06/26/2023     Accompanied by: unaccompanied    Patient comes today after Dr. Tobie, ENT sent a referral for a hearing evaluation due to concerns with fluid/mastoiditis in the right ear .   Symptoms Yes Details  Hearing loss  [x]  Right ear  Tinnitus  []    Ear pain/ Ear infections  [x]   Right ear sounds like in a barrel, onset about 1 month ago  Balance problems  [x]  Reportedly is due to his MS diagnosis  Noise exposure  []    Previous ear surgeries  []    Family history  [x]  Sister started wearing hearing aids due to age  Amplification  []    Other  []      Otoscopy: Right ear: Abnormal eardrum appearance. Left ear:  Clear external ear canals and notable landmarks visualized on the tympanic membrane.  Tympanometry: Right ear: Type B- Normal external ear canal volume with no middle ear pressure and tympanic membrane compliance Left ear: Type A- Normal external ear canal volume with normal middle ear pressure and tympanic membrane compliance    Pure tone Audiometry: Right ear-Borderline normal to profound mixed hearing loss from 250 Hz - 8000 Hz. Left ear-  Normal sloping to severe sensorineural hearing loss from 250 Hz - 8000 Hz.  The hearing test results were completed under headphones and results are deemed to be of good to fair reliability. Test technique:  conventional     Speech Audiometry: Right ear- Speech Reception Threshold (SRT) was obtained at 35 dBHL Left ear-Speech Awareness Threshold (SAT) was observed at 15 dBHL   Word Recognition Score Tested using NU-6 (MLV) Right ear: 80% was obtained at a presentation level of 85 dBHL with contralateral masking which is deemed as  good  Left ear: 80% was obtained at  a presentation level of 80 dBHL with contralateral masking which is deemed as  good     Recommendations: Follow up with ENT as scheduled for today.  Repeat audiogram after medical care.   Zandria Woldt MARIE LEROUX-MARTINEZ, AUD

## 2023-06-27 ENCOUNTER — Ambulatory Visit (INDEPENDENT_AMBULATORY_CARE_PROVIDER_SITE_OTHER): Payer: Medicare Other | Admitting: Audiology

## 2023-06-27 ENCOUNTER — Ambulatory Visit (INDEPENDENT_AMBULATORY_CARE_PROVIDER_SITE_OTHER): Payer: Medicare Other | Admitting: Otolaryngology

## 2023-06-27 ENCOUNTER — Encounter (INDEPENDENT_AMBULATORY_CARE_PROVIDER_SITE_OTHER): Payer: Self-pay

## 2023-06-27 VITALS — BP 171/91 | HR 81 | Ht 69.0 in | Wt 195.0 lb

## 2023-06-27 DIAGNOSIS — H6991 Unspecified Eustachian tube disorder, right ear: Secondary | ICD-10-CM

## 2023-06-27 DIAGNOSIS — H90A21 Sensorineural hearing loss, unilateral, right ear, with restricted hearing on the contralateral side: Secondary | ICD-10-CM | POA: Diagnosis not present

## 2023-06-27 DIAGNOSIS — H6501 Acute serous otitis media, right ear: Secondary | ICD-10-CM

## 2023-06-27 DIAGNOSIS — J328 Other chronic sinusitis: Secondary | ICD-10-CM | POA: Diagnosis not present

## 2023-06-27 DIAGNOSIS — H90A31 Mixed conductive and sensorineural hearing loss, unilateral, right ear with restricted hearing on the contralateral side: Secondary | ICD-10-CM

## 2023-06-27 MED ORDER — AZELASTINE HCL 0.1 % NA SOLN: 2.0000 | Freq: Two times a day (BID) | NASAL | 12 refills | Status: DC

## 2023-06-27 MED ORDER — FLUTICASONE PROPIONATE 50 MCG/ACT NA SUSP: 2.0000 | Freq: Two times a day (BID) | NASAL | 2 refills | Status: DC

## 2023-06-27 NOTE — Patient Instructions (Signed)
 Use flonase spray - two sprays each nostril twice per day Use astelin spray - two sprays each nostril twice per day - use it right after the flonase spray Try to pop your ear a few times a day

## 2023-06-27 NOTE — Progress Notes (Signed)
 Dear Dr. Bluford, Here is my assessment for our mutual patient, Kerry Solis. Thank you for allowing me the opportunity to care for your patient. Please do not hesitate to contact me should you have any other questions. Sincerely, Dr. Eldora Blanch  Otolaryngology Clinic Note Referring provider: Dr. Bluford HPI:  Kerry Solis is a 78 y.o. male kindly referred by Dr. Bluford for evaluation of right mastoiditis and ME effusion and eustachian tube dysfunction.  Initial visit (06/2023): Patient reports: right ear pain and pressure starting in October, admitted to hospital for mastoiditis. This helped with pain and pressure, but he reports that he still feels like there is some fluid behind the ear (hearing under a barrel). He thinks it is getting a little better. No pain or pressure in the ear. Hearing is also down in that ear. Patient denies: vertigo, drainage, tinnitus Patient additionally denies: deep pain in ear canal, epopping/crackling, sensitivity to pressure changes Patient also denies barotrauma, vestibular suppressant use, ototoxic medication use Prior ear surgery: no No frequent ear infections. Before this, he did not have much ear trouble. No nasal sprays. No nasal obstruction, neck masses, bleeding from nose, facial pressure/pain. Denies frequent sinus infections or CRS sx including pressure, pain, hyposmia, discolored drainage. Need for Abx  Not on any sprays  H&N Surgery: Neck fusion Personal or FHx of bleeding dz or anesthesia difficulty: no  GLP-1: no AP/AC: no  Tobacco: no. Alcohol: no. 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 Lives in [place], [place]  PMHx: MS, PNA, HIV, HTN, T2DM, GERD, BPH  Independent Review of Additional Tests or Records:  ID notes Kerry Solis) - 03/20/2023: HIV follow up, progressive decline, still taking HIV meds. No missed doses. Dx: HIV, Asx, no changes; f/u regularly ED/Hospital admission notes (04/05/2023) - Dr. Pearlean - noted 2 week h/o right ear  pain, worse. No fever, headache, drainage ear; CT Temporal bones done, given ceftriaxone , tylenol . Rx: d/c on keflex  and doxy Dr. Bluford - Primary Care (04/08/2023 and 04/16/2023): still some right ear pain pain, ref to ENT, given tramadol  and augmentin  Dr. Okey (ENT) 04/05/2023: HIV w/ R ear pain x 2 weeks, CT with otitis media no coalescent changes; recommend abx and outpatient management Labs: HbA1c 04/06/2023: 6.7, CBC 04/06/2023: WBC 8.5; BCX 04/05/2023: NG' HIV PCR 09/2022: undetectable viral load CT Temporal bone 04/05/2023 independently reviewed and interpreted: agree with read - some fluid in right mastoid cells and right ME; no coalscent changes; right sinusitis (right max, left max, ethmoid and right frontal); likely right sphenoid fungal ball MRI 12/2014 Brain: persitent right sinusitis changes; likely right sphenoid fungal ball  06/2023 Audiogram was independently reviewed and interpreted by me and it reveals generally symmetric downsloping HL with SNHL on left, mixed on right with type B tymp right, A left; WRT 80% at 35 and 15 dB AD and AS respectively   SNHL= Sensorineural hearing loss  PMH/Meds/All/SocHx/FamHx/ROS:   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   Depression    Elevated liver enzymes    Glaucoma    High triglycerides    History of gout    HIV (human immunodeficiency virus infection) (HCC)    Hypertension    Leukopenia    Low CD4   Multiple sclerosis (HCC)    uses a cane   Myocardial infarction (HCC)    45 years ago   Prostate cancer (HCC)  Past Surgical History:  Procedure Laterality Date   BIOPSY  08/14/2016   Procedure: BIOPSY;  Surgeon: [name], MD;  [facility];  Service: Endoscopy;;  gastric bx's   CHOLECYSTECTOMY  06/10/2011   Procedure: LAPAROSCOPIC CHOLECYSTECTOMY;  Surgeon: [name], MD;  Location: [facility];  Service: General;   Laterality: N/A;   COLONOSCOPY  03/10/2009   DOQ:fnizmjuz internal hemorrhoids/6-mm sessile ascending colon polyp/tortuous colon, diverticulosis. TA   COLONOSCOPY WITH PROPOFOL  N/A 04/19/2015   Dr. Haddock: sessile serrated adenoma, surveillance in 5 years    ESOPHAGOGASTRODUODENOSCOPY  06/23/2008   DOQ:rymnwpr gastritis/schatzki ring   ESOPHAGOGASTRODUODENOSCOPY (EGD) WITH PROPOFOL  N/A 08/14/2016   Dr. Haddock: moderate Schatzi's ring s/p dilation, LA Grade A esophagitis,small hiatal hernia, chronic gastritis (negative H.pylori), one duodenal diverticulum   LAPAROSCOPIC APPENDECTOMY N/A 12/13/2017   Procedure: APPENDECTOMY LAPAROSCOPIC;  Surgeon: [name], MD;  Location: [facility];  Service: General;  Laterality: N/A;   NECK SURGERY  unsure   disc   POLYPECTOMY N/A 04/19/2015   Procedure: POLYPECTOMY;  Surgeon: [name], MD;  Location: [facility];  Service: Endoscopy;  Laterality: N/A;  ascending colon   PROSTATE BIOPSY     RADIOACTIVE SEED IMPLANT N/A 11/29/2016   Procedure: RADIOACTIVE SEED IMPLANT/BRACHYTHERAPY IMPLANT;  Surgeon: [name], MD;  Location: [facility];  Service: Urology;  Laterality: N/A;  81 seeds implanted   SAVORY DILATION N/A 08/14/2016   Procedure: SAVORY DILATION;  Surgeon: [name], MD;  [facility];  Service: Endoscopy;  Laterality: N/A;    Family History  Problem Relation Age of Onset   Hypertension Mother    Heart attack Mother    Heart attack Father    Cancer Sister        Breast   Diabetes Sister    Diabetes Brother    Cancer Brother        unknown   Cancer Brother        colon   Cancer Sister        breast   Cancer Brother        prostate   Cancer Brother        colon     Social Connections: Moderately Isolated (06/14/2023)   Social Connection and Isolation Panel [NHANES]    Frequency of Communication with Friends and Family: Twice a week    Frequency of Social Gatherings with Friends and Family: Once  a week    Attends Religious Services: More than 4 times per year    Active Member of [employer] or Organizations: No    Attends Engineer, Structural: Never    Marital Status: Divorced      Current Outpatient Medications:    allopurinol  (ZYLOPRIM ) 100 MG tablet, TAKE ONE TABLET BY MOUTH EVERY DAY FOR GOUT, Disp: 30 tablet, Rfl: 5   amitriptyline  (ELAVIL ) 25 MG tablet, Take 50 mg by mouth at bedtime., Disp: , Rfl:    amLODipine  (NORVASC ) 5 MG tablet, TAKE ONE TABLET BY MOUTH EVERY DAY HIGH BLOOD PRESSURE, Disp: 30 tablet, Rfl: 5   azelastine  (ASTELIN ) 0.1 % nasal spray, Place 2 sprays into both nostrils 2 (two) times daily. Use in each nostril as directed, Disp: 30 mL, Rfl: 12   DESCOVY  200-25 MG tablet, Take 1 tablet by mouth daily., Disp: , Rfl:    ezetimibe  (ZETIA ) 10 MG tablet, TAKE ONE TABLET BY MOUTH ONCE DAILY, Disp: 30 tablet, Rfl: 5   gabapentin  (NEURONTIN ) 100 MG  capsule, Take 1 capsule by mouth 3 (three) times daily., Disp: , Rfl:    metFORMIN  (GLUCOPHAGE -XR) 500 MG 24 hr tablet, TAKE ONE TABLET BY MOUTH TWICE DAILY WITH A MEAL, Disp: 60 tablet, Rfl: 5   pantoprazole  (PROTONIX ) 40 MG tablet, TAKE ONE TABLET BY MOUTH EVERY DAY, Disp: 30 tablet, Rfl: 5   silodosin  (RAPAFLO ) 8 MG CAPS capsule, Take 1 capsule (8 mg total) by mouth at bedtime., Disp: 30 capsule, Rfl: 11   TIVICAY  50 MG tablet, Take 50 mg by mouth daily., Disp: , Rfl:    fluticasone  (FLONASE ) 50 MCG/ACT nasal spray, Place 2 sprays into both nostrils in the morning and at bedtime., Disp: 16 g, Rfl: 2   Physical Exam:   BP (!) 171/91 (BP Location: Right Arm, Patient Position: Sitting, Cuff Size: Normal)   Pulse 81   Ht 5' 9 (1.753 m)   Wt 195 lb (88.5 kg)   SpO2 97%   BMI 28.80 kg/m   Salient findings:  CN II-XII intact Given history and complaints, ear microscopy was indicated and performed for evaluation with findings as below in physical exam section and in procedures  Bilateral EAC clear and TM intact b/l;  right serous effusion; no postauricular proptosis or tenderness Anterior rhinoscopy: Septum relatively midline; bilateral inferior turbinates without significant hypertrophy No lesions of oral cavity/oropharynx No obviously palpable neck masses/lymphadenopathy/thyromegaly No respiratory distress or stridor  Seprately Identifiable Procedures:  Procedure: Bilateral ear microscopy using microscope (CPT 92504) Pre-procedure diagnosis: right mastoiditis, right middle ear effusion Post-procedure diagnosis: same Indication: Concern for right middle ear effusion and mastoiditis; given patient's otologic complaints and history, for improved and comprehensive examination of external ear and tympanic membrane, bilateral otologic examination using microscope was performed  Procedure: Patient was placed semi-recumbent. Both ear canals were examined using the microscope with findings above. Patient tolerated the procedure well.    Impression & Plans:  Krikor Willet is a 78 y.o. male with h/o MS, PNA, HIV, HTN, T2DM, GERD, BPH now seen in follow up for:  1. Non-recurrent acute serous otitis media of right ear   2. Dysfunction of right eustachian tube   3. Other chronic sinusitis    HIV Viral load undetectable; persistent effusion after likely AOM of right ear in October for which he was admitted and treated with IV and then PO antibiotics. He is almost 3 months out, but with persistent effusion. D/w pt endo but declined given unilateral effusion.  In addition, discussed options -- observation with medical management and right tube. He wishes for medical management; if does not resolve, will treat. Consider right sphenoidotomy for likely fungal ball given immunocompromised but will re-discuss at next visit  - Start Flonase  BID - Start Astelin  BID - Autoinsufflate ears multiple times per day - f/u in 1 month for recheck  See below regarding exact medications prescribed this encounter including  dosages and route: Meds ordered this encounter  Medications   fluticasone  (FLONASE ) 50 MCG/ACT nasal spray    Sig: Place 2 sprays into both nostrils in the morning and at bedtime.    Dispense:  16 g    Refill:  2   azelastine  (ASTELIN ) 0.1 % nasal spray    Sig: Place 2 sprays into both nostrils 2 (two) times daily. Use in each nostril as directed    Dispense:  30 mL    Refill:  12      Thank you for allowing me the opportunity to care for your patient. Please do  not hesitate to contact me should you have any other questions.  Sincerely, Eldora Blanch, MD Otolarynoglogist (ENT), [facility] Phone: [phone] Fax: [phone]  07/07/2023, 6:18 PM   I have personally spent 50 minutes involved in face-to-face and non-face-to-face activities for this patient on the day of the visit.  Professional time spent excludes any procedures performed but includes the following activities, in addition to those noted in the documentation: preparing to see the patient (review of outside documentation and results), performing a medically appropriate examination, counseling, ordering medications, documenting in the electronic health record

## 2023-07-18 ENCOUNTER — Other Ambulatory Visit: Payer: Self-pay | Admitting: Family Medicine

## 2023-07-21 ENCOUNTER — Other Ambulatory Visit: Payer: Self-pay | Admitting: Family Medicine

## 2023-07-22 ENCOUNTER — Other Ambulatory Visit: Payer: Self-pay

## 2023-07-22 DIAGNOSIS — K219 Gastro-esophageal reflux disease without esophagitis: Secondary | ICD-10-CM

## 2023-07-22 MED ORDER — PANTOPRAZOLE SODIUM 40 MG PO TBEC: DELAYED_RELEASE_TABLET | ORAL | 5 refills | Status: DC

## 2023-07-30 ENCOUNTER — Telehealth (INDEPENDENT_AMBULATORY_CARE_PROVIDER_SITE_OTHER): Payer: Self-pay | Admitting: Otolaryngology

## 2023-07-30 NOTE — Telephone Encounter (Signed)
Reminder Call: Date: 07/31/2023 Status: Sch  Time: 10:15 AM [address] [place], [place] [address]  Confirmed time and location w/patient.

## 2023-07-31 ENCOUNTER — Ambulatory Visit (INDEPENDENT_AMBULATORY_CARE_PROVIDER_SITE_OTHER): Payer: Medicare Other | Admitting: Otolaryngology

## 2023-07-31 ENCOUNTER — Ambulatory Visit (INDEPENDENT_AMBULATORY_CARE_PROVIDER_SITE_OTHER): Payer: Medicare Other | Admitting: Audiology

## 2023-07-31 ENCOUNTER — Encounter (INDEPENDENT_AMBULATORY_CARE_PROVIDER_SITE_OTHER): Payer: Self-pay

## 2023-07-31 VITALS — BP 149/81 | HR 54

## 2023-07-31 DIAGNOSIS — H90A31 Mixed conductive and sensorineural hearing loss, unilateral, right ear with restricted hearing on the contralateral side: Secondary | ICD-10-CM | POA: Diagnosis not present

## 2023-07-31 DIAGNOSIS — Z0110 Encounter for examination of ears and hearing without abnormal findings: Secondary | ICD-10-CM | POA: Diagnosis not present

## 2023-07-31 DIAGNOSIS — J328 Other chronic sinusitis: Secondary | ICD-10-CM

## 2023-07-31 DIAGNOSIS — H6501 Acute serous otitis media, right ear: Secondary | ICD-10-CM

## 2023-07-31 DIAGNOSIS — H6991 Unspecified Eustachian tube disorder, right ear: Secondary | ICD-10-CM

## 2023-07-31 MED ORDER — AZELASTINE HCL 0.1 % NA SOLN: 2.0000 | Freq: Two times a day (BID) | NASAL | 12 refills | Status: DC

## 2023-07-31 MED ORDER — FLUTICASONE PROPIONATE 50 MCG/ACT NA SUSP: 2.0000 | Freq: Two times a day (BID) | NASAL | 2 refills | Status: DC

## 2023-07-31 NOTE — Progress Notes (Signed)
 Dear Dr. Gerda Diss, Here is my assessment for our mutual patient, Kerry Solis. Thank you for allowing me the opportunity to care for your patient. Please do not hesitate to contact me should you have any other questions. Sincerely, Dr. Jovita Kussmaul  Otolaryngology Clinic Note Referring provider: Dr. Gerda Diss HPI:  Kerry Solis is a 78 y.o. male kindly referred by Dr. Gerda Diss for evaluation of right mastoiditis and ME effusion and eustachian tube dysfunction.  Initial visit (06/2023): Patient reports: right ear pain and pressure starting in October, admitted to hospital for mastoiditis. This helped with pain and pressure, but he reports that he still feels like there is some fluid behind the ear ("hearing under a barrel"). He thinks it is getting a little better. No pain or pressure in the ear. Hearing is also down in that ear. Patient denies: vertigo, drainage, tinnitus Patient additionally denies: deep pain in ear canal, epopping/crackling, sensitivity to pressure changes Patient also denies barotrauma, vestibular suppressant use, ototoxic medication use Prior ear surgery: no No frequent ear infections. Before this, he did not have much ear trouble. No nasal sprays. No nasal obstruction, neck masses, bleeding from nose, facial pressure/pain. Denies frequent sinus infections or CRS sx including pressure, pain, hyposmia, discolored drainage. Need for Abx  Not on any sprays --------------------------------------------------------- 07/31/2023 Doing much better. Feels like fluid is gone, no hearing loss, no vertigo, drainage, tinnitus, no popping/crackling. He has been doing the sprays. No pain. Tymps unfortunately showed B on right, A on left. -----------------------------------------------------------  H&N Surgery: Neck fusion Personal or FHx of bleeding dz or anesthesia difficulty: no  GLP-1: no AP/AC: no  Tobacco: no. Alcohol: no. 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 Lives in [place],  [place]  PMHx: MS, PNA, HIV, HTN, T2DM, GERD, BPH  Independent Review of Additional Tests or Records:  ID notes Kerry Solis) - 03/20/2023: HIV follow up, progressive decline, still taking HIV meds. No missed doses. Dx: HIV, Asx, no changes; f/u regularly ED/Hospital admission notes (04/05/2023) - Dr. Mariea Solis - noted 2 week h/o right ear pain, worse. No fever, headache, drainage ear; CT Temporal bones done, given ceftriaxone, tylenol. Rx: d/c on keflex and doxy Dr. Adriana Solis - Primary Care (04/08/2023 and 04/16/2023): still some right ear pain pain, ref to ENT, given tramadol and augmentin Dr. Irene Solis (ENT) 04/05/2023: HIV w/ R ear pain x 2 weeks, CT with otitis media no coalescent changes; recommend abx and outpatient management Labs: HbA1c 04/06/2023: 6.7, CBC 04/06/2023: WBC 8.5; BCX 04/05/2023: NG' HIV PCR 09/2022: undetectable viral load CT Temporal bone 04/05/2023 independently reviewed and interpreted: agree with read - some fluid in right mastoid cells and right ME; no coalscent changes; right sinusitis (right max, left max, ethmoid and right frontal); likely right sphenoid fungal ball MRI 12/2014 Brain: persitent right sinusitis changes; likely right sphenoid fungal ball  06/2023 Audiogram was independently reviewed and interpreted by me and it reveals generally symmetric downsloping HL with SNHL on left, mixed on right with type B tymp right, A left; WRT 80% at 35 and 15 dB AD and AS respectively   SNHL= Sensorineural hearing loss  07/2023 Tymps: B AD; A AS   PMH/Meds/All/SocHx/FamHx/ROS:   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   Depression    Elevated liver enzymes    Glaucoma    High triglycerides    History of gout    HIV (human immunodeficiency  virus infection) (HCC)    Hypertension    Leukopenia    Low CD4   Multiple sclerosis (HCC)    uses a cane   Myocardial infarction (HCC)    45  years ago   Prostate cancer [facility])      Past Surgical History:  Procedure Laterality Date   BIOPSY  08/14/2016   Procedure: BIOPSY;  Surgeon: Kerry Bali, MD;  [facility];  Service: Endoscopy;;  gastric bx's   CHOLECYSTECTOMY  06/10/2011   Procedure: LAPAROSCOPIC CHOLECYSTECTOMY;  Surgeon: Kerry Bering, MD;  Location: [facility];  Service: General;  Laterality: N/A;   COLONOSCOPY  03/10/2009   WUJ:WJXBJYNW internal hemorrhoids/6-mm sessile ascending colon polyp/tortuous colon, diverticulosis. TA   COLONOSCOPY WITH PROPOFOL N/A 04/19/2015   Kerry Solis: sessile serrated adenoma, surveillance in 5 years    ESOPHAGOGASTRODUODENOSCOPY  06/23/2008   GNF:AOZHYQM gastritis/schatzki ring   ESOPHAGOGASTRODUODENOSCOPY (EGD) WITH PROPOFOL N/A 08/14/2016   Kerry Solis: moderate Schatzi's ring s/p dilation, LA Grade A esophagitis,small hiatal hernia, chronic gastritis (negative H.pylori), one duodenal diverticulum   LAPAROSCOPIC APPENDECTOMY N/A 12/13/2017   Procedure: APPENDECTOMY LAPAROSCOPIC;  Surgeon: Kerry Macho, MD;  Location: [facility];  Service: General;  Laterality: N/A;   NECK SURGERY  unsure   disc   POLYPECTOMY N/A 04/19/2015   Procedure: POLYPECTOMY;  Surgeon: Kerry Bali, MD;  Location: [facility];  Service: Endoscopy;  Laterality: N/A;  ascending colon   PROSTATE BIOPSY     RADIOACTIVE SEED IMPLANT N/A 11/29/2016   Procedure: RADIOACTIVE SEED IMPLANT/BRACHYTHERAPY IMPLANT;  Surgeon: Kerry Matar, MD;  Location: [facility];  Service: Urology;  Laterality: N/A;  81 seeds implanted   SAVORY DILATION N/A 08/14/2016   Procedure: SAVORY DILATION;  Surgeon: Kerry Bali, MD;  [facility];  Service: Endoscopy;  Laterality: N/A;    Family History  Problem Relation Age of Onset   Hypertension Mother    Heart attack Mother    Heart attack Father    Cancer Sister        Breast   Diabetes Sister    Diabetes Brother    Cancer Brother        unknown    Cancer Brother        colon   Cancer Sister        breast   Cancer Brother        prostate   Cancer Brother        colon     Social Connections: Moderately Isolated (06/14/2023)   Social Connection and Isolation Panel [NHANES]    Frequency of Communication with Friends and Family: Twice a week    Frequency of Social Gatherings with Friends and Family: Once a week    Attends Religious Services: More than 4 times per year    Active Member of [employer] or Organizations: No    Attends Engineer, structural: Never    Marital Status: Divorced      Current Outpatient Medications:    allopurinol (ZYLOPRIM) 100 MG tablet, TAKE ONE TABLET BY MOUTH EVERY DAY FOR GOUT, Disp: 30 tablet, Rfl: 5   amitriptyline (ELAVIL) 25 MG tablet, Take 50 mg by mouth at bedtime., Disp: , Rfl:    amLODipine (NORVASC) 5 MG tablet, TAKE ONE TABLET BY MOUTH ONCE DAILY HIGH BLOOD PRESSURE, Disp: 30 tablet, Rfl: 5   DESCOVY 200-25 MG tablet, Take 1 tablet by mouth daily., Disp: , Rfl:    ezetimibe (ZETIA) 10 MG  tablet, TAKE ONE TABLET BY MOUTH ONCE DAILY, Disp: 30 tablet, Rfl: 5   gabapentin (NEURONTIN) 100 MG capsule, Take 1 capsule by mouth 3 (three) times daily., Disp: , Rfl:    metFORMIN (GLUCOPHAGE-XR) 500 MG 24 hr tablet, TAKE ONE TABLET BY MOUTH TWICE DAILY WITH A MEAL, Disp: 60 tablet, Rfl: 5   pantoprazole (PROTONIX) 40 MG tablet, TAKE ONE TABLET BY MOUTH EVERY DAY, Disp: 30 tablet, Rfl: 5   silodosin (RAPAFLO) 8 MG CAPS capsule, Take 1 capsule (8 mg total) by mouth at bedtime., Disp: 30 capsule, Rfl: 11   TIVICAY 50 MG tablet, Take 50 mg by mouth daily., Disp: , Rfl:    azelastine (ASTELIN) 0.1 % nasal spray, Place 2 sprays into both nostrils 2 (two) times daily. Use in each nostril as directed, Disp: 30 mL, Rfl: 12   fluticasone (FLONASE) 50 MCG/ACT nasal spray, Place 2 sprays into both nostrils in the morning and at bedtime., Disp: 16 g, Rfl: 2   Physical Exam:   BP (!) 149/81 (BP Location: Left  Arm, Cuff Size: Normal)   Pulse (!) 54   SpO2 99%   Salient findings:  CN II-XII intact  Bilateral EAC clear and TM intact b/l - query minimal right posterior effusion so rigid endoscopy was performed; no postauricular proptosis or tenderness Anterior rhinoscopy: Septum relatively midline; bilateral inferior turbinates without significant hypertrophy No lesions of oral cavity/oropharynx No obviously palpable neck masses/lymphadenopathy/thyromegaly No respiratory distress or stridor  Seprately Identifiable Procedures:  PROCEDURE: Bilateral Diagnostic Rigid Nasal Endoscopy Pre-procedure diagnosis: Concern for unilateral ear effusion, need to rule out eustachian tube mass Post-procedure diagnosis: same Indication: See pre-procedure diagnosis and physical exam above Complications: None apparent EBL: 0 mL Anesthesia: Lidocaine 4% and topical decongestant was topically sprayed in each nasal cavity  Description of Procedure:  Patient was identified. A rigid 30 degree endoscope was utilized to evaluate the sinonasal cavities, mucosa, sinus ostia and turbinates and septum.  Overall, signs of mucosal inflammation are not noted.  Also noted are no masses over ETD.  No mucopurulence, polyps, or masses noted.   Right Middle meatus: clear Right SE Recess: clear Left MM: clear Left SE Recess: clear   Photodocumentation was obtained.  CPT CODE -- 19147 - Mod 25    Impression & Plans:  Giovanni Biby is a 78 y.o. male with h/o MS, PNA, HIV, HTN, T2DM, GERD, BPH now seen in follow up for:  1. 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 Eustachian tube dysfunction, right   3. Dysfunction of right eustachian tube   4. Other chronic sinusitis   5. Non-recurrent acute serous otitis media of right ear    HIV Viral load undetectable; persistent effusion after likely AOM of right ear in October for which he was admitted and treated with IV  and then PO antibiotics. He is now over 3 months out, and is feeling better after medical management of ETD but still with small effusion; no ET masses on nasal endoscopy. Again discussed options -- observation with medical management and right tube. He wishes for medical management; if does not resolve, will need tube.  Will also again consider right sphenoidotomy for likely fungal ball given immunocompromised but will re-discuss at next visit  - Continue Flonase BID - Continue Astelin BID - Autoinsufflate ears multiple times per day - f/u in 6-8 weeks for recheck  See below regarding exact medications prescribed this encounter including  dosages and route: Meds ordered this encounter  Medications   fluticasone (FLONASE) 50 MCG/ACT nasal spray    Sig: Place 2 sprays into both nostrils in the morning and at bedtime.    Dispense:  16 g    Refill:  2   azelastine (ASTELIN) 0.1 % nasal spray    Sig: Place 2 sprays into both nostrils 2 (two) times daily. Use in each nostril as directed    Dispense:  30 mL    Refill:  12   Thank you for allowing me the opportunity to care for your patient. Please do not hesitate to contact me should you have any other questions.  Sincerely, Jovita Kussmaul, MD Otolaryngologist (ENT), [facility] Phone: [phone] Fax: [phone]  08/11/2023, 4:03 PM   I have personally spent 33 minutes involved in face-to-face and non-face-to-face activities for this patient on the day of the visit.  Professional time spent excludes any procedures performed but includes the following activities, in addition to those noted in the documentation: preparing to see the patient (review of outside documentation and results), performing a medically appropriate examination, counseling, ordering medications (astelin, flonase refill), documenting in the electronic health record, independently interpreting results

## 2023-08-01 NOTE — Progress Notes (Signed)
  [address] [place], [place] [address] [phone]  Tympanometry Evaluation    Name: Kerry Solis     DOB:   June 10, 1946      MRN:   [id number]                                                                                     Service Date: 08/01/2023     Accompanied by: unaccompanied to the booth   Patient comes today after Dr. Allena Katz, ENT sent a referral for a tympanometry as a follow up after right ear mixed hearing loss and type B tympanogram on 06-27-2023. Patient reports to feel much better and that can also hear better from the right side.   Otoscopy: Both ears: Clear external ear canals.   Tympanometry: Right ear: Normal external ear canal volume with negative middle ear pressure and reduced tympanic membrane compliance. Left ear: Type A- Normal external ear canal volume with normal middle ear pressure and tympanic membrane compliance.  Recommendations:   Repeat audiogram in conjunctions with medical care/follow up.   Erynn Vaca MARIE LEROUX-MARTINEZ, AUD

## 2023-08-06 ENCOUNTER — Ambulatory Visit: Payer: Self-pay | Admitting: Licensed Clinical Social Worker

## 2023-08-06 ENCOUNTER — Encounter: Payer: Self-pay | Admitting: Audiology

## 2023-08-06 NOTE — Patient Instructions (Signed)
Visit Information  Thank you for taking time to visit with me today. Please don't hesitate to contact me if I can be of assistance to you.   Following are the goals we discussed today:   Goals Addressed             This Visit's Progress    Patient Stated he gets sad sometimes due to inability to do activities he used to do       Interventions:  Spoke with client via phone  today about his current needs and status Client said he sometimes has balance issues. He does use a cane to help him walk Client has support of his granddaughter and of his nephews. Client has daughter who lives in [place], [place]. His daughter calls him frequently Spoke of transportation needs. Client said he drives for short trips.  He only drives short distances from his home Client said he had an appointment recently with doctor regarding ear issue. He said he is scheduled to have follow up appointment with that doctor in 2 months regarding care for his ear Client said he is having some stomach pain issues. LCSW encouraged client to call PCP office and talk with nurse at PCP  office about stomach issues of client.  Nurse can then collaborate with client PCP about treatment options for client stomach pain issue Discussed medication procurement of client Discussed client numbness in his toes. Client said numbness in his toes has been improving.  Provided counseling support for client Client said he has decreased sleep. Client uses [employer] as needed Thanked client for phone call with LCSW today. Client was appreciative of call today from LCSW Encouraged client to access program support as needed. Encouraged Onalee Hua to call LCSW as needed for SW support at [phone].           LCSW to call client as scheduled to assess client needs at that time  Please call the care guide team at [phone] if you need to cancel or reschedule your appointment.   If you are experiencing a Mental Health or Behavioral  Health Crisis or need someone to talk to, please go to [facility] [address] [place] [phone])   The patient verbalized understanding of instructions, educational materials, and care plan provided today and DECLINED offer to receive copy of patient instructions, educational materials, and care plan.   The patient has been provided with contact information for the care management team and has been advised to call with any health related questions or concerns.    Lorna Few  MSW, LCSW [place] Licensed Clinical Social Worker Direct Dial:  [phone] Fax:  [phone] Website:  [email or web address]

## 2023-08-06 NOTE — Patient Outreach (Signed)
Care Coordination   Follow Up Visit Note   08/06/2023 Name: Kerry Solis MRN: [id number] DOB: [birth year]  Kerry Solis is a 78 y.o. year old male who sees Luking, Jonna Coup, MD for primary care. I spoke with  Kerry Solis by phone today.  What matters to the patients health and wellness today? Patient Stated he gets sad sometimes due to inability to do activities he used to do    Goals Addressed             This Visit's Progress    Patient Stated he gets sad sometimes due to inability to do activities he used to do       Interventions:  Spoke with client via phone  today about his current needs and status Client said he sometimes has balance issues. He does use a cane to help him walk Client has support of his granddaughter and of his nephews. Client has daughter who lives in [place], [place]. His daughter calls him frequently Spoke of transportation needs. Client said he drives for short trips.  He only drives short distances from his home Client said he had an appointment recently with doctor regarding ear issue. He said he is scheduled to have follow up appointment with that doctor in 2 months regarding care for his ear Client said he is having some stomach pain issues. LCSW encouraged client to call PCP office and talk with nurse at PCP  office about stomach issues of client.  Nurse can then collaborate with client PCP about treatment options for client stomach pain issue Discussed medication procurement of client Discussed client numbness in his toes. Client said numbness in his toes has been improving.  Provided counseling support for client Client said he has decreased sleep. Client uses [employer] as needed Thanked client for phone call with LCSW today. Client was appreciative of call today from LCSW Encouraged client to access program support as needed. Encouraged Kerry Solis to call LCSW as needed for SW support at [phone].            SDOH assessments and  interventions completed:  Yes   SDOH Interventions Today    Flowsheet Row Most Recent Value  SDOH Interventions   Depression Interventions/Treatment  Currently on Treatment  Physical Activity Interventions Other (Comments)  [client has some occasional mobility issues]  Stress Interventions Provide Counseling  [client has some stress in managing medical needs]        Care Coordination Interventions:  Yes, provided   Interventions Today    Flowsheet Row Most Recent Value  Chronic Disease   Chronic disease during today's visit Other  [spoke with client about client needs]  General Interventions   General Interventions Discussed/Reviewed General Interventions Discussed, Community Resources  Education Interventions   Education Provided Provided Education  Provided Verbal Education On [employer]  Mental Health Interventions   Mental Health Discussed/Reviewed Coping Strategies  [client is trying to manage medical needs faced. He has some anxiety issues]  Nutrition Interventions   Nutrition Discussed/Reviewed Nutrition Discussed  Pharmacy Interventions   Pharmacy Dicussed/Reviewed Pharmacy Topics Discussed  Safety Interventions   Safety Discussed/Reviewed Fall Risk        Follow up plan: LCSW to call client as scheduled to assess client needs at that time   Encounter Outcome:  Patient Visit Completed    Lorna Few  MSW, LCSW [place] Licensed Clinical Social Worker Direct Dial:  [phone] Fax:  [phone] Website:  [email or web address]

## 2023-08-08 ENCOUNTER — Encounter: Payer: Self-pay | Admitting: Family Medicine

## 2023-08-08 ENCOUNTER — Ambulatory Visit: Payer: Medicare Other | Admitting: Family Medicine

## 2023-08-08 VITALS — BP 144/82 | HR 82 | Temp 98.3°F | Ht 69.0 in | Wt 191.0 lb

## 2023-08-08 DIAGNOSIS — R1030 Lower abdominal pain, unspecified: Secondary | ICD-10-CM | POA: Diagnosis not present

## 2023-08-08 DIAGNOSIS — Z8546 Personal history of malignant neoplasm of prostate: Secondary | ICD-10-CM

## 2023-08-08 DIAGNOSIS — Z79899 Other long term (current) drug therapy: Secondary | ICD-10-CM | POA: Diagnosis not present

## 2023-08-08 NOTE — Patient Instructions (Addendum)
Plan of Treatment - documented as of this encounter  Plan of Treatment - Upcoming Encounters Upcoming Encounters Date Type Department Care Team (Latest Contact Info) Description  09/04/2023 9:00 AM EDT Infusion [facility] - [facility] 09 Neurology Infusion  [facility]  [place], [place] [address]  [phone]        Please [facility] we will let you know the results  We will set you up for a CT scan of the abdomen and pelvis  Also you have a follow-up appointment on 22nd at 9 AM on Wednesday with me  Plus also see documentation above you have an appointment with [facility] March 19 9 AM for an infusion with neurology  If any symptoms or problems please call-thanks-Dr. Lilyan Punt

## 2023-08-08 NOTE — Progress Notes (Signed)
   Subjective:    Patient ID: Kerry Solis, male    DOB: 1946-03-11, 78 y.o.   MRN: [id number]  HPI  Soreness in lower abd - 2 to 3 months , weigth loss noticed pants are too big now .Early satiety, has noticed fatigue and lack of memory acuity w/o ocrevus MS medicine thru [facility] has not heard from them  Significant mid to lower abdominal pain discomfort over the past 2 to 3 months his weight is gone down 5 pounds according to the patient less appetite compared to where it used to be Denies any rectal bleeding Does have MS and HIV as underlying issues Patient denies sweats at nighttime.  Denies vomiting.  Urination going well  Review of Systems     Objective:   Physical Exam General-in no acute distress Eyes-no discharge Lungs-respiratory rate normal, CTA CV-no murmurs,RRR Extremities skin warm dry no edema Neuro grossly normal Behavior normal, alert Abdomen is soft with mild tenderness in the mid abdomen and lower abdomen       Assessment & Plan:  1. Lower abdominal pain (Primary) Because of the persistent of abdominal pain over the past several weeks and worsening to the point that it has waking him up at night it is recommended to do CT scan and lab work.  Patient also has a history of cancer and also has HIV so he is at higher risk of infections - CT ABDOMEN PELVIS WO CONTRAST - Basic Metabolic Panel - CBC with Differential - Lipase - Hepatic Function Panel - PSA  2. History of prostate cancer Check PSA await the result, CT scan also indicated see discussion above - CT ABDOMEN PELVIS WO CONTRAST - Basic Metabolic Panel - CBC with Differential - Lipase - Hepatic Function Panel - PSA  3. High risk medication use Labs ordered - Basic Metabolic Panel - CBC with Differential - Lipase - Hepatic Function Panel - PSA

## 2023-08-09 LAB — HEPATIC FUNCTION PANEL
ALT: 21 [IU]/L (ref 0–44)
AST: 26 [IU]/L (ref 0–40)
Albumin: 5 g/dL — ABNORMAL HIGH (ref 3.8–4.8)
Alkaline Phosphatase: 87 [IU]/L (ref 44–121)
Bilirubin Total: 0.3 mg/dL (ref 0.0–1.2)
Bilirubin, Direct: 0.11 mg/dL (ref 0.00–0.40)
Total Protein: 7.4 g/dL (ref 6.0–8.5)

## 2023-08-09 LAB — CBC WITH DIFFERENTIAL/PLATELET
Basophils Absolute: 0 10*3/uL (ref 0.0–0.2)
Basos: 1 %
EOS (ABSOLUTE): 0 10*3/uL (ref 0.0–0.4)
Eos: 1 %
Hematocrit: 43.3 % (ref 37.5–51.0)
Hemoglobin: 14.3 g/dL (ref 13.0–17.7)
Immature Grans (Abs): 0 10*3/uL (ref 0.0–0.1)
Immature Granulocytes: 0 %
Lymphocytes Absolute: 2.4 10*3/uL (ref 0.7–3.1)
Lymphs: 49 %
MCH: 28.9 pg (ref 26.6–33.0)
MCHC: 33 g/dL (ref 31.5–35.7)
MCV: 88 fL (ref 79–97)
Monocytes Absolute: 0.4 10*3/uL (ref 0.1–0.9)
Monocytes: 9 %
Neutrophils Absolute: 2 10*3/uL (ref 1.4–7.0)
Neutrophils: 40 %
Platelets: 268 10*3/uL (ref 150–450)
RBC: 4.94 x10E6/uL (ref 4.14–5.80)
RDW: 14 % (ref 11.6–15.4)
WBC: 4.9 10*3/uL (ref 3.4–10.8)

## 2023-08-09 LAB — BASIC METABOLIC PANEL
BUN/Creatinine Ratio: 11 (ref 10–24)
BUN: 12 mg/dL (ref 8–27)
CO2: 24 mmol/L (ref 20–29)
Calcium: 10.2 mg/dL (ref 8.6–10.2)
Chloride: 107 mmol/L — ABNORMAL HIGH (ref 96–106)
Creatinine, Ser: 1.07 mg/dL (ref 0.76–1.27)
Glucose: 84 mg/dL (ref 70–99)
Potassium: 4.6 mmol/L (ref 3.5–5.2)
Sodium: 145 mmol/L — ABNORMAL HIGH (ref 134–144)
eGFR: 71 mL/min/{1.73_m2} (ref >59)

## 2023-08-09 LAB — PSA: Prostate Specific Ag, Serum: <0.1 ng/mL (ref 0.0–4.0)

## 2023-08-09 LAB — LIPASE: Lipase: 54 U/L (ref 13–78)

## 2023-08-10 ENCOUNTER — Encounter: Payer: Self-pay | Admitting: Family Medicine

## 2023-08-10 NOTE — Progress Notes (Signed)
 Please mail to the patient thank you

## 2023-08-17 ENCOUNTER — Ambulatory Visit (HOSPITAL_COMMUNITY): Payer: Medicare Other

## 2023-08-20 ENCOUNTER — Ambulatory Visit: Payer: Self-pay | Admitting: Licensed Clinical Social Worker

## 2023-08-20 NOTE — Patient Instructions (Signed)
 Visit Information  Thank you for taking time to visit with me today. Please don't hesitate to contact me if I can be of assistance to you.   Following are the goals we discussed today:   Goals Addressed             This Visit's Progress    Patient Stated he gets sad sometimes due to inability to do activities he used to do       Interventions:  Spoke with client via phone  today about his current needs and status Client said he sometimes has balance issues. He does use a cane to help him walk. He does like to walk for exercise.  He has a Development worker, international aid and enjoys walking his dog Client has support of his granddaughter and of his nephews. Client has daughter who lives in [place], [place]. His daughter calls him frequently Spoke of transportation needs. Client said he drives for short trips.  He only drives short distances from his home Client said he has a follow up appointment next month regarding care for his ears Discussed medication procurement of client Discussed client support with PCP, Dr. Babs Sciara Provided counseling support for client Used Active Listening skills to allow client to share his feelings with LCSW Client said he has decreased sleep occasionally. Client uses Life Alert as needed Client said he sometimes has decreased appetite Regarding socializing with friends, client likes to go out to eat with his friends weekly .  He said he eats well when he is out going to restaurant with his friends Discussed mood issues of client. No mood issues noted at present Thanked client for phone call with LCSW today. Client was appreciative of call today from LCSW Encouraged client to access program support as needed. Encouraged Kerry Solis to call LCSW as needed for SW support at [phone].           Our next appointment is by telephone on 10/22/23 at 4:00 PM   Please call the care guide team at [phone] if you need to cancel or reschedule your appointment.   If you are  experiencing a Mental Health or Behavioral Health Crisis or need someone to talk to, please go to [facility] [address] [place] [phone])   The patient verbalized understanding of instructions, educational materials, and care plan provided today and DECLINED offer to receive copy of patient instructions, educational materials, and care plan.   The patient has been provided with contact information for the care management team and has been advised to call with any health related questions or concerns.    Lorna Few  MSW, LCSW [place] [facility] Licensed Clinical Social Worker Direct Dial:  [phone] Fax:  [phone] Website:  [email or web address]

## 2023-08-20 NOTE — Patient Outreach (Signed)
 Care Coordination   Follow Up Visit Note   08/20/2023 Name: Kerry Solis MRN: [id number] DOB: Sep 29, 1945  Kerry Solis is a 78 y.o. year old male who sees Luking, Jonna Coup, MD for primary care. I spoke with  Kerry Solis by phone today.  What matters to the patients health and wellness today?  Patient Stated he gets sad sometimes due to inability to do activities he used to do    Goals Addressed             This Visit's Progress    Patient Stated he gets sad sometimes due to inability to do activities he used to do       Interventions:  Spoke with client via phone  today about his current needs and status Client said he sometimes has balance issues. He does use a cane to help him walk. He does like to walk for exercise.  He has a Development worker, international aid and enjoys walking his dog Client has support of his granddaughter and of his nephews. Client has daughter who lives in [place], [place]. His daughter calls him frequently Spoke of transportation needs. Client said he drives for short trips.  He only drives short distances from his home Client said he has a follow up appointment next month regarding care for his ears Discussed medication procurement of client Discussed client support with PCP, Dr. Babs Sciara Provided counseling support for client Used Active Listening skills to allow client to share his feelings with LCSW Client said he has decreased sleep occasionally. Client uses Life Alert as needed Client said he sometimes has decreased appetite Regarding socializing with friends, client likes to go out to eat with his friends weekly .  He said he eats well when he is out going to restaurant with his friends Discussed mood issues of client. No mood issues noted at present Thanked client for phone call with LCSW today. Client was appreciative of call today from LCSW Encouraged client to access program support as needed. Encouraged Kerry Solis to call LCSW as needed for SW support at  [phone].           SDOH assessments and interventions completed:  Yes  SDOH Interventions Today    Flowsheet Row Most Recent Value  SDOH Interventions   Depression Interventions/Treatment  Counseling  Physical Activity Interventions Other (Comments)  [likes to walk for exercise. uses a cane to help him walk]  Stress Interventions Provide Counseling  [has stress in managing medical needs]        Care Coordination Interventions:  Yes, provided   Interventions Today    Flowsheet Row Most Recent Value  Chronic Disease   Chronic disease during today's visit Other  [spoke with client about client needs]  General Interventions   General Interventions Discussed/Reviewed General Interventions Discussed, Community Resources  Education Interventions   Education Provided Provided Education  Provided Engineer, [employer]  Mental Health Interventions   Mental Health Discussed/Reviewed Coping Strategies  [likes to walk for exercise. No mood issues noted]  Nutrition Interventions   Nutrition Discussed/Reviewed Nutrition Discussed  Pharmacy Interventions   Pharmacy Dicussed/Reviewed Pharmacy Topics Discussed  Safety Interventions   Safety Discussed/Reviewed Fall Risk        Follow up plan: Follow up call scheduled for 10/22/23 at 4:00 PM    Encounter Outcome:  Patient Visit Completed    Lorna Few  MSW, LCSW [place] Licensed Clinical Social Worker Direct Dial:  [phone] Fax:  [phone] Website:  [email or web address]

## 2023-09-04 DIAGNOSIS — G35 Multiple sclerosis: Secondary | ICD-10-CM | POA: Diagnosis not present

## 2023-09-11 ENCOUNTER — Other Ambulatory Visit: Payer: Self-pay | Admitting: Family Medicine

## 2023-09-18 ENCOUNTER — Ambulatory Visit: Payer: Medicare Other | Admitting: Family Medicine

## 2023-09-18 DIAGNOSIS — G35 Multiple sclerosis: Secondary | ICD-10-CM | POA: Diagnosis not present

## 2023-09-18 DIAGNOSIS — B451 Cerebral cryptococcosis: Secondary | ICD-10-CM | POA: Diagnosis not present

## 2023-09-18 DIAGNOSIS — Z79899 Other long term (current) drug therapy: Secondary | ICD-10-CM | POA: Diagnosis not present

## 2023-09-18 DIAGNOSIS — Z792 Long term (current) use of antibiotics: Secondary | ICD-10-CM | POA: Diagnosis not present

## 2023-09-18 DIAGNOSIS — I10 Essential (primary) hypertension: Secondary | ICD-10-CM | POA: Diagnosis not present

## 2023-09-19 ENCOUNTER — Ambulatory Visit: Admitting: Family Medicine

## 2023-09-19 ENCOUNTER — Encounter: Payer: Self-pay | Admitting: Family Medicine

## 2023-09-19 VITALS — BP 136/76 | HR 52 | Temp 98.2°F | Ht 69.0 in | Wt 189.0 lb

## 2023-09-19 DIAGNOSIS — E1169 Type 2 diabetes mellitus with other specified complication: Secondary | ICD-10-CM

## 2023-09-19 DIAGNOSIS — R413 Other amnesia: Secondary | ICD-10-CM

## 2023-09-19 DIAGNOSIS — Z8546 Personal history of malignant neoplasm of prostate: Secondary | ICD-10-CM

## 2023-09-19 DIAGNOSIS — G35 Multiple sclerosis: Secondary | ICD-10-CM | POA: Diagnosis not present

## 2023-09-19 DIAGNOSIS — B20 Human immunodeficiency virus [HIV] disease: Secondary | ICD-10-CM

## 2023-09-19 NOTE — Progress Notes (Signed)
 Subjective:    Patient ID: Kerry Solis, male    DOB: Apr 21, 1946, 78 y.o.   MRN: [id number]  HPI Patient is very concerned that he is not doing as well as he thinks he should.  He relates a lot of fatigue tiredness feeling rundown.  In addition to this he is concerned that his health issues could get worse he enjoys being independent does his own driving stays local 3 month follow up  Discussed the use of AI scribe software for clinical note transcription with the patient, who gave verbal consent to proceed.  History of Present Illness   Kerry Solis is a 78 year old male with multiple sclerosis who presents with fatigue and memory issues.  He experiences significant fatigue, describing it as 'no energy' nearly every day, lasting throughout the day. Resting and pacing himself have been ineffective, and he feels he is 'not getting anywhere.'  He has concerns about memory issues, stating he can remember conversations but forgets details shortly after. He is considering writing notes to compensate.  His sleep pattern is disrupted; he often falls asleep in a chair while watching TV and wakes up around midnight, finding it difficult to return to sleep. On nights he goes to bed, he typically does so around 10 PM but wakes up multiple times during the night, struggling to fall back asleep.  He has been receiving infusions for multiple sclerosis, which were previously every six months but have been extended to every nine months. He notes feeling unwell after the change in frequency and does not feel the same immediate benefit from the infusions as before.  He lives alone and manages his own grocery shopping and meal preparation. He prefers cooking his meals, typically having scrambled eggs and [place] bacon for breakfast, vegetables for lunch, and leftovers for dinner. He primarily drinks water and occasionally lemonade. He has a daughter who calls every few days. He attends church regularly and  participates in church activities. He has a group of friends he meets with occasionally, who are younger than him.      Review of Systems     Objective:   Physical Exam General-in no acute distress Eyes-no discharge Lungs-respiratory rate normal, CTA CV-no murmurs,RRR Extremities skin warm dry no edema Neuro grossly normal Behavior normal, alert        Assessment & Plan:  1. History of prostate cancer (Primary) Previous PSA look very good no sign of recurrence  2. Multiple sclerosis (HCC) Under care of specialist at [facility] doing well they have backed off the infusion every 9 months to reduce the risk of infections  3. Controlled type 2 diabetes mellitus with other specified complication, without long-term current use of insulin (HCC) Previously doing well no lab work indicated today  4. Poor short term memory Patient does have mild short-term memory dysfunction but otherwise able to function well moods are doing well he has decent support network  HIV-being handled by doctors at [facility] under good care treatments are going very well Follow-up within 5 months  Assessment and Plan    Multiple Sclerosis (MS) Reports worsening symptoms after extending infusion interval to nine months to reduce immunosuppression risk. Prefers to continue current treatment schedule. Neurologist monitoring with follow-up in December. MRI every 1-2 years for disease progression. - Continue MS infusions every nine months. - Follow up with neurologist in December. - Monitor for signs of infection due to immunosuppression.  Fatigue Chronic daily fatigue likely multifactorial, possibly related to  age and MS treatment adjustments.  Age-related Memory Changes Forgetfulness likely age-related without severe cognitive impairment. Discussed compensatory strategies. - Use index cards or a small notebook for notes. - Place reminders on sticky notes in visible areas.  General Health Maintenance Overall  health well-managed with normal labs and controlled blood pressure. - Continue current nutritional measures. - Maintain regular check-ups and lab work as needed.

## 2023-09-30 ENCOUNTER — Encounter (INDEPENDENT_AMBULATORY_CARE_PROVIDER_SITE_OTHER): Payer: Self-pay

## 2023-09-30 ENCOUNTER — Ambulatory Visit (INDEPENDENT_AMBULATORY_CARE_PROVIDER_SITE_OTHER): Admitting: Audiology

## 2023-09-30 ENCOUNTER — Ambulatory Visit (INDEPENDENT_AMBULATORY_CARE_PROVIDER_SITE_OTHER): Payer: Medicare Other | Admitting: Otolaryngology

## 2023-09-30 VITALS — BP 175/76 | HR 51

## 2023-09-30 DIAGNOSIS — H6991 Unspecified Eustachian tube disorder, right ear: Secondary | ICD-10-CM

## 2023-09-30 DIAGNOSIS — H90A31 Mixed conductive and sensorineural hearing loss, unilateral, right ear with restricted hearing on the contralateral side: Secondary | ICD-10-CM

## 2023-09-30 DIAGNOSIS — H6501 Acute serous otitis media, right ear: Secondary | ICD-10-CM | POA: Diagnosis not present

## 2023-09-30 DIAGNOSIS — Z0110 Encounter for examination of ears and hearing without abnormal findings: Secondary | ICD-10-CM

## 2023-09-30 MED ORDER — FLUTICASONE PROPIONATE 50 MCG/ACT NA SUSP: 2.0000 | Freq: Two times a day (BID) | NASAL | 2 refills | Status: AC

## 2023-09-30 MED ORDER — AZELASTINE HCL 0.1 % NA SOLN: 2.0000 | Freq: Two times a day (BID) | NASAL | 12 refills | Status: AC

## 2023-09-30 NOTE — Progress Notes (Signed)
 Dear Dr. Fairy Homer, Here is my assessment for our mutual patient, Kerry Solis. Thank you for allowing me the opportunity to care for your patient. Please do not hesitate to contact me should you have any other questions. Sincerely, Dr. Milon Aloe  Otolaryngology Clinic Note Referring provider: Dr. Fairy Homer HPI:  Kerry Solis is a 78 y.o. male kindly referred by Dr. Fairy Homer for evaluation of right mastoiditis and ME effusion and eustachian tube dysfunction.  Initial visit (06/2023): Patient reports: right ear pain and pressure starting in October, admitted to hospital for mastoiditis. This helped with pain and pressure, but he reports that he still feels like there is some fluid behind the ear ("hearing under a barrel"). He thinks it is getting a little better. No pain or pressure in the ear. Hearing is also down in that ear. Patient denies: vertigo, drainage, tinnitus Patient additionally denies: deep pain in ear canal, epopping/crackling, sensitivity to pressure changes Patient also denies barotrauma, vestibular suppressant use, ototoxic medication use Prior ear surgery: no No frequent ear infections. Before this, he did not have much ear trouble. No nasal sprays. No nasal obstruction, neck masses, bleeding from nose, facial pressure/pain. Denies frequent sinus infections or CRS sx including pressure, pain, hyposmia, discolored drainage. Need for Abx  Not on any sprays --------------------------------------------------------- 07/31/2023 Doing much better. Feels like fluid is gone, no hearing loss, no vertigo, drainage, tinnitus, no popping/crackling. He has been doing the sprays. No pain. Tymps unfortunately showed B on right, A on left. --------------------------------------------------------- 09/30/2023 Seen in follow up. Feels like he has no ear issues and he "forgot all about it". Ran out of pains. No pain. Tymps done again today. No headaches, no sinus  issues --------------------------------------------------------  H&N Surgery: Neck fusion Personal or FHx of bleeding dz or anesthesia difficulty: no  GLP-1: no AP/AC: no  Tobacco: no. Alcohol: no. 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 Lives in [place], [place]  PMHx: MS, PNA, HIV, HTN, T2DM, GERD, BPH  Independent Review of Additional Tests or Records:  ID notes Maryellen Snare) - 03/20/2023: HIV follow up, progressive decline, still taking HIV meds. No missed doses. Dx: HIV, Asx, no changes; f/u regularly ED/Hospital admission notes (04/05/2023) - Dr. Quintella Buck - noted 2 week h/o right ear pain, worse. No fever, headache, drainage ear; CT Temporal bones done, given ceftriaxone, tylenol. Rx: d/c on keflex and doxy Dr. Debrah Fan - Primary Care (04/08/2023 and 04/16/2023): still some right ear pain pain, ref to ENT, given tramadol and augmentin Dr. Larkin Plumb (ENT) 04/05/2023: HIV w/ R ear pain x 2 weeks, CT with otitis media no coalescent changes; recommend abx and outpatient management Labs: HbA1c 04/06/2023: 6.7, CBC 04/06/2023: WBC 8.5; BCX 04/05/2023: NG' HIV PCR 09/2022: undetectable viral load CT Temporal bone 04/05/2023 independently reviewed and interpreted: agree with read - some fluid in right mastoid cells and right ME; no coalscent changes; right sinusitis (right max, left max, ethmoid and right frontal); likely right sphenoid fungal ball MRI 12/2014 Brain: persitent right sinusitis changes; likely right sphenoid fungal ball  06/2023 Audiogram was independently reviewed and interpreted by me and it reveals generally symmetric downsloping HL with SNHL on left, mixed on right with type B tymp right, A left; WRT 80% at 35 and 15 dB AD and AS respectively   SNHL= Sensorineural hearing loss  07/2023 Tymps: B AD; A AS 09/2023 Tymps: C(?) - slightly negative pressure AD; A AS   PMH/Meds/All/SocHx/FamHx/ROS:   Past Medical History:  Diagnosis Date   Anxiety    Arthritis  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   Depression    Elevated liver enzymes    Glaucoma    High triglycerides    History of gout    HIV (human immunodeficiency virus infection) (HCC)    Hypertension    Leukopenia    Low CD4   Multiple sclerosis (HCC)    uses a cane   Myocardial infarction (HCC)    45 years ago   Prostate cancer [facility])      Past Surgical History:  Procedure Laterality Date   BIOPSY  08/14/2016   Procedure: BIOPSY;  Surgeon: West Bali, MD;  [facility];  Service: Endoscopy;;  gastric bx's   CHOLECYSTECTOMY  06/10/2011   Procedure: LAPAROSCOPIC CHOLECYSTECTOMY;  Surgeon: Fabio Bering, MD;  Location: [facility];  Service: General;  Laterality: N/A;   COLONOSCOPY  03/10/2009   [id number] internal hemorrhoids/6-mm sessile ascending colon polyp/tortuous colon, diverticulosis. TA   COLONOSCOPY WITH PROPOFOL N/A 04/19/2015   Dr. Darrick Penna: sessile serrated adenoma, surveillance in 5 years    ESOPHAGOGASTRODUODENOSCOPY  06/23/2008   JWJ:XBJYNWG gastritis/schatzki ring   ESOPHAGOGASTRODUODENOSCOPY (EGD) WITH PROPOFOL N/A 08/14/2016   Dr. Darrick Penna: moderate Schatzi's ring s/p dilation, LA Grade A esophagitis,small hiatal hernia, chronic gastritis (negative H.pylori), one duodenal diverticulum   LAPAROSCOPIC APPENDECTOMY N/A 12/13/2017   Procedure: APPENDECTOMY LAPAROSCOPIC;  Surgeon: Franky Macho, MD;  Location: [facility];  Service: General;  Laterality: N/A;   NECK SURGERY  unsure   disc   POLYPECTOMY N/A 04/19/2015   Procedure: POLYPECTOMY;  Surgeon: West Bali, MD;  Location: [facility];  Service: Endoscopy;  Laterality: N/A;  ascending colon   PROSTATE BIOPSY     RADIOACTIVE SEED IMPLANT N/A 11/29/2016   Procedure: RADIOACTIVE SEED IMPLANT/BRACHYTHERAPY IMPLANT;  Surgeon: Marcine Matar, MD;  Location[facility];  Service: Urology;  Laterality: N/A;  81 seeds implanted   SAVORY DILATION N/A 08/14/2016   Procedure: SAVORY DILATION;   Surgeon: West Bali, MD;  [facility]: [facility];  Service: Endoscopy;  Laterality: N/A;    Family History  Problem Relation Age of Onset   Hypertension Mother    Heart attack Mother    Heart attack Father    Cancer Sister        Breast   Diabetes Sister    Diabetes Brother    Cancer Brother        unknown   Cancer Brother        colon   Cancer Sister        breast   Cancer Brother        prostate   Cancer Brother        colon     Social Connections: Moderately Isolated (06/14/2023)   Social Connection and Isolation Panel [NHANES]    Frequency of Communication with Friends and Family: Twice a week    Frequency of Social Gatherings with Friends and Family: Once a week    Attends Religious Services: More than 4 times per year    Active Member of [employer] or Organizations: No    Attends Engineer, structural: Never    Marital Status: Divorced      Current Outpatient Medications:    allopurinol (ZYLOPRIM) 100 MG tablet, TAKE ONE TABLET BY MOUTH ONCE DAILY FOR GOUT, Disp: 30 tablet, Rfl: 5   amitriptyline (ELAVIL) 25 MG tablet, Take 50 mg by mouth at bedtime., Disp: , Rfl:    amLODipine (  NORVASC) 5 MG tablet, TAKE ONE TABLET BY MOUTH ONCE DAILY HIGH BLOOD PRESSURE, Disp: 30 tablet, Rfl: 5   DESCOVY 200-25 MG tablet, Take 1 tablet by mouth daily., Disp: , Rfl:    ezetimibe (ZETIA) 10 MG tablet, TAKE ONE TABLET BY MOUTH ONCE DAILY, Disp: 30 tablet, Rfl: 5   gabapentin (NEURONTIN) 100 MG capsule, Take 1 capsule by mouth 3 (three) times daily., Disp: , Rfl:    metFORMIN (GLUCOPHAGE-XR) 500 MG 24 hr tablet, TAKE ONE TABLET BY MOUTH TWICE DAILY WITH A MEAL, Disp: 60 tablet, Rfl: 5   pantoprazole (PROTONIX) 40 MG tablet, TAKE ONE TABLET BY MOUTH EVERY DAY, Disp: 30 tablet, Rfl: 5   silodosin (RAPAFLO) 8 MG CAPS capsule, Take 1 capsule (8 mg total) by mouth at bedtime., Disp: 30 capsule, Rfl: 11   TIVICAY 50 MG tablet, Take 50 mg by mouth daily., Disp: , Rfl:    azelastine  (ASTELIN) 0.1 % nasal spray, Place 2 sprays into both nostrils 2 (two) times daily. Use in each nostril as directed, Disp: 30 mL, Rfl: 12   fluticasone (FLONASE) 50 MCG/ACT nasal spray, Place 2 sprays into both nostrils in the morning and at bedtime., Disp: 16 g, Rfl: 2   Physical Exam:   BP (!) 175/76   Pulse (!) 51   SpO2 95%   Salient findings:  CN II-XII intact Given history and complaints, ear microscopy was indicated and performed for evaluation with findings as below in physical exam section and in procedures; Bilateral EAC clear and TM intact b/l - no effusion noted today and his right ear looks to be without pathology Anterior rhinoscopy: Septum relatively midline; bilateral inferior turbinates without significant hypertrophy No lesions of oral cavity/oropharynx No obviously palpable neck masses/lymphadenopathy/thyromegaly No respiratory distress or stridor  Seprately Identifiable Procedures:  Procedure: Bilateral ear microscopy using microscope (CPT 92504) Pre-procedure diagnosis: right eustachian tube dysfunction and otitis media Post-procedure diagnosis: same Indication: given patient's otologic complaints and history, for improved and comprehensive examination of external ear and tympanic membrane, bilateral otologic examination using microscope was performed  Procedure: Patient was placed semi-recumbent. Both ear canals were examined using the microscope with findings above.  Patient tolerated the procedure well.     Impression & Plans:  Elya Diloreto is a 78 y.o. male with h/o MS, PNA, HIV, HTN, T2DM, GERD, BPH now seen in follow up for:  1. 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 Eustachian tube dysfunction, right   3. Non-recurrent acute serous otitis media of right ear    HIV Viral load undetectable; had an effusion after likely AOM of right ear in October 2024 for which he was admitted and treated  with IV and then PO antibiotics. He is now multiple months out and his symptoms have completely resolved; given unilateral effusion, did do prior endo which was reassuring Tymp today is type C on right but seems borderline A. Exam is reassuring Again discussed options -- observation with medical management and right tube. He wishes for observation, which is reasonable Will also again consider right sphenoidotomy for likely fungal ball given immunocompromised; discussed but declined  - Would re-start flonase and astelin BID - he ran out and has not been using it - Autoinsufflate ears multiple times per day - f/u next year with audio  See below regarding exact medications prescribed this encounter including dosages and route: Meds ordered this encounter  Medications   fluticasone (  FLONASE) 50 MCG/ACT nasal spray    Sig: Place 2 sprays into both nostrils in the morning and at bedtime.    Dispense:  16 g    Refill:  2   azelastine (ASTELIN) 0.1 % nasal spray    Sig: Place 2 sprays into both nostrils 2 (two) times daily. Use in each nostril as directed    Dispense:  30 mL    Refill:  12   Thank you for allowing me the opportunity to care for your patient. Please do not hesitate to contact me should you have any other questions.  Sincerely, Milon Aloe, MD Otolaryngologist (ENT), [facility] Phone: [phone] Fax: [phone]  09/30/2023, 10:33 AM   MDM:  Level 3: 99213 Complexity/Problems addressed: mod - multiple chronic issues Data complexity: low - Morbidity: mod - Prescription Drug prescribed or managed: yes

## 2023-09-30 NOTE — Progress Notes (Signed)
  [address], [address] [place], [place] [address] [phone]  Audiological Evaluation    Name: Kerry Solis     DOB:   [birth year]      MRN:   [id number]                                                                                     Service Date: 09/30/2023     Accompanied by: unaccompanied    Patient comes today after Dr. Lydia Sams, ENT sent a referral for a hearing evaluation due to concerns with Eustachian tube dysfunction.   Symptoms Yes Details  Hearing loss  [x]  06-26-2023: Right ear-Borderline normal to profound mixed hearing loss from 250 Hz - 8000 Hz. Left ear-  Normal sloping to severe sensorineural hearing loss from 250 Hz - 8000 Hz.  Tinnitus  []    Ear pain/ infections/pressure  [x]  Reports right ear feels better.  Balance problems  [x]  Reportedly is due to his MS diagnosis  Noise exposure history  []    Previous ear surgeries  []    Family history of hearing loss  [x]  Sister started wearing hearing aids due to age  Amplification  []    Other  []      Otoscopy: Right ear: Clear external ear canals and notable landmarks visualized on the tympanic membrane. Left ear:  Clear external ear canals and notable landmarks visualized on the tympanic membrane.  Tympanometry: Right ear: Type C- Normal external ear canal volume with negative middle ear pressure and normal tympanic membrane compliance Left ear: Type A- Normal external ear canal volume with normal middle ear pressure and tympanic membrane compliance   Recommendations: Follow up with ENT as scheduled for today.  Return for a hearing evaluation if concerns with hearing changes arise or per MD recommendation.   Tarry Fountain MARIE LEROUX-MARTINEZ, AUD

## 2023-10-22 ENCOUNTER — Ambulatory Visit: Payer: Self-pay | Admitting: Licensed Clinical Social Worker

## 2023-10-22 NOTE — Patient Instructions (Signed)
 Visit Information  Thank you for taking time to visit with me today. Please don't hesitate to contact me if I can be of assistance to you before our next scheduled appointment.  Our next appointment is by telephone on 12/16/23 at 3:30 PM   Please call the care guide team at [phone] if you need to cancel or reschedule your appointment.   Following is a copy of your care plan:   Goals Addressed             This Visit's Progress    VBCI Social Work Care Plan       Problems:   Some transportation challenges             Some vision challenges             Mobility challenges. Uses a cane to help him walk             Management of MS diagnosis is challenging to client              Pain issues  CSW Clinical Goal(s):   Over the next 30 days the Patient will attend all scheduled medical appointments as evidenced by patient report and care team review of appointment completion in electronic MEDICAL RECORD NUMBER  .              Over next 30 days, patient will use coping skills learned to help him manage Pain issues faced AEB patient report of  adequate pain management  daily and patient report of completing ADLs daily.   Interventions:  Discussed MS symptoms of client and client management of MS.  Client sees neurologist to provide medical support for client for MS management            Discussed medication procurement of client            Discussed pain management of client            Discussed coping skills of client. Client has pet dog and enjoys walking his pet dog. Client enjoys watching TV or listening to music             Discussed transport needs. Client drives only short distances. He spoke of vision challenges for client            Discussed socialization of client. He enjoys going out to eat with his friends            Discussed ear issues of client. He is taking a prescribed medication for his ear            Discussed client care with PCP, Dr. Charlotta Cook              Encouraged client to call LCSW as needed for SW support at [phone]   Patient Goals/Self-Care Activities:  Take medications as prescribed              Attend appointments with PCP as scheduled. Client sees Dr. Charlotta Cook as PCP for client             Attend appointments with neurologist as scheduled             Allow time to complete ADLs. Allow time to rest and relax. Allow time to do daily activities of choice.             Call LCSW as needed for SW support             Plan:  Telephone follow up appointment with care management team member scheduled for:  12/16/23 at 3:30 PM         Please go to [facility] [address] [place] [phone]) if you are experiencing a Mental Health or Behavioral Health Crisis or need someone to talk to.  The patient verbalized understanding of instructions, educational materials, and care plan provided today and DECLINED offer to receive copy of patient instructions, educational materials, and care plan.   Patient was provided with contact information about Care Team.  LCSW gave Kerry Solis name and phone number and encouraged Kerry Solis to call LCSW as needed for SW support.   Alexandria Angel  MSW, LCSW [place] Licensed Clinical Social Worker Direct Dial:  [phone] Fax:  [phone] Website:  [email or web address]

## 2023-10-22 NOTE — Patient Outreach (Signed)
 Complex Care Management   Visit Note  10/22/2023  Name:  Kerry Solis MRN: [id number] DOB: January 18, 1946  Situation: Referral received for Complex Care Management related to  client management of medical needs. Client has diagnosis of MS.   I obtained verbal consent from Patient.  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 May fatigue occasionally    Endocrine    Low energy reported. Has to take rest breaks periodically  Gastrointestinal    GERD    Genitourinary  No issues reported    Integumentary    Unable to assess from patient information provided  Musculoskeletal  Gout; fall risk; uses a cane to help him walk. MS diagnosis  Reports some weight loss      Psychosocial              10/22/2023    3:43 PM  Depression screen PHQ 2/9  Decreased Interest 1  Down, Depressed, Hopeless 1  PHQ - 2 Score 2  Altered sleeping 1  Tired, decreased energy 1  Change in appetite 1  Feeling bad or failure about yourself  1  Trouble concentrating 0  Moving slowly or fidgety/restless 1  Suicidal thoughts 0  PHQ-9 Score 7  Difficult doing work/chores Somewhat difficult    Vitals:   BP within normal range per client information  Medications Reviewed Today     Reviewed by Afton Horse (Social Worker) on 10/22/23 at 1541  Med List Status: <None>   Medication Order Taking? Sig Documenting Provider Last Dose Status Informant  allopurinol  (ZYLOPRIM ) 100 MG tablet [id number] No TAKE ONE TABLET BY MOUTH ONCE DAILY FOR GOUT Luking, Jackelyn Marvel, MD Taking Active   amitriptyline  (ELAVIL ) 25 MG tablet [id number] No Take 50 mg by mouth at bedtime. [provider] Taking Active Family Member, Pharmacy Records  amLODipine  (NORVASC ) 5 MG tablet [id number] No TAKE ONE TABLET BY MOUTH ONCE DAILY HIGH BLOOD PRESSURE Luking, Jackelyn Marvel, MD Taking Active   azelastine  (ASTELIN ) 0.1 % nasal spray [id number]  Place 2 sprays into both nostrils 2 (two) times daily. Use in each nostril as directed Evelina Hippo, MD  Active   DESCOVY  200-25 MG tablet [id number] No Take 1 tablet by mouth daily. [provider] Taking Active Family Member, Pharmacy Records  ezetimibe  (ZETIA ) 10 MG tablet [id number] No TAKE ONE TABLET BY MOUTH ONCE DAILY Luking, Jackelyn Marvel, MD Taking Active   fluticasone  (FLONASE ) 50 MCG/ACT nasal spray [id number]  Place 2 sprays into both nostrils in the morning and at bedtime. Evelina Hippo, MD  Active  gabapentin  (NEURONTIN ) 100 MG capsule [id number] No Take 1 capsule by mouth 3 (three) times daily. [provider] Taking Active Family Member, Pharmacy Records  metFORMIN  (GLUCOPHAGE -XR) 500 MG 24 hr tablet [id number] No TAKE ONE TABLET BY MOUTH TWICE DAILY WITH A MEAL Luking, Scott A, MD Taking Active   pantoprazole  (PROTONIX ) 40 MG tablet [id number] No TAKE ONE TABLET BY MOUTH EVERY DAY Luking, Jackelyn Marvel, MD Taking Active   silodosin  (RAPAFLO ) 8 MG CAPS capsule [id number] No Take 1 capsule (8 mg total) by mouth at bedtime. McKenzie, Arden Beck, MD Taking Active Family Member, Pharmacy Records  TIVICAY  50 MG tablet [id number] No Take 50 mg by mouth daily. [provider] Taking Active Family Member, Pharmacy Records  Med List Note Clarance Cristal, CPhT 03/15/16  1244): Daughter Ferna How [phone] or [phone].             Recommendation:   PCP Follow-up  as needed Client to attend appointments with neurologist as scheduled Client to take medications as prescribed Client to continue to socialize with friends as he is able Client to drive short distances as he is able. Client said he only drives short trips during day time hours He said he does not drive at night Client to allow time to complete ADLs. Client to allow time to rest and to relax Client to call LCSW as needed for SW support   Follow Up Plan:   LCSW to call client on 12/16/23 at 3:30 PM    Alexandria Angel  MSW, LCSW [place] Licensed Clinical Social Worker Direct Dial:  [phone] Fax:  [phone] Website:  [email or web address]

## 2023-11-05 ENCOUNTER — Other Ambulatory Visit: Payer: Self-pay | Admitting: Family Medicine

## 2023-12-16 ENCOUNTER — Other Ambulatory Visit: Payer: Self-pay | Admitting: Licensed Clinical Social Worker

## 2023-12-16 ENCOUNTER — Other Ambulatory Visit: Payer: Self-pay

## 2023-12-16 NOTE — Patient Instructions (Signed)
 Visit Information  Thank you for taking time to visit with me today. Please don't hesitate to contact me if I can be of assistance to you before our next scheduled appointment.  Our next appointment is by telephone on 01/20/24 at 10:00 AM   Please call the care guide team at [phone] if you need to cancel or reschedule your appointment.   Following is a copy of your care plan:   Goals Addressed             This Visit's Progress    VBCI Social Work Care Plan       Problems:   Some transportation challenges  Client drives short distances during the day             Some vision challenges             Mobility challenges. Uses a cane to help him walk. Kerry Solis said he uses a cane daily to help him walk             Management of MS diagnosis is challenging to client. He spoke of support from neurologist              Pain issues.  He talks with PCP about pain management  CSW Clinical Goal(s):   Over the next 30 days the Patient will attend all scheduled medical appointments as evidenced by patient report and care team review of appointment completion in electronic MEDICAL RECORD NUMBER .              Over next 30 days, patient will use coping skills learned to help him manage Pain issues faced AEB patient report of  adequate pain management  daily and patient report of completing ADLs daily.   Interventions:  Discussed MS symptoms of client and client management of MS.  Client sees neurologist to provide medical support for client for MS management Client said his next appointment with neurologist was in December of 2025            Discussed medication procurement of client            Discussed pain management of client            Discussed coping skills of client. Client has pet dog and enjoys walking his pet dog. Client enjoys watching TV or listening to music             Discussed transport needs. Client drives only short distances. He spoke of vision challenges for client             Discussed socialization of client. He enjoys going out to eat with his friends occasionally            Discussed ear issues of client. He is taking a prescribed medication for his ear. He spoke of having some hearing challenges            Discussed client care with PCP, Dr. Glendia Fielding.  Client said he feels that he gets great medical support from Dr. Glendia Fielding.            Reminded client of program support with RN, LCSW, Pharmacist           Discussed MS symptoms of client and client management of MS symptoms          Discussed client completion of ADLs          Encouraged client to call LCSW as needed for SW support at [phone]  Patient Goals/Self-Care Activities:  Take medications as prescribed              Attend appointments with PCP as scheduled. Client sees Dr. Glendia Fielding as PCP for client             Attend appointments with neurologist as scheduled             Allow time to complete ADLs. Allow time to rest and relax. Allow time to do daily activities of choice.              Allow time to socialize with friends as client is able             Call LCSW as needed for SW support             Plan:   Telephone follow up appointment with care management team member scheduled for:  01/20/24 at 10:00 AM         Please call the [facility]: [phone] if you are experiencing a Mental Health or Behavioral Health Crisis or need someone to talk to.  The patient verbalized understanding of instructions, educational materials, and care plan provided today and DECLINED offer to receive copy of patient instructions, educational materials, and care plan.    Kerry Solis  MSW, LCSW [place] Licensed Clinical Social Worker Direct Dial:  [phone] Fax:  [phone] Website:  [email or web address]

## 2023-12-16 NOTE — Patient Outreach (Signed)
 Complex Care Management   Visit Note  12/16/2023  Name:  Kerry Solis MRN: [id number] DOB: 1945/10/24  Situation: Referral received for Complex Care Management related to diagnosis of MS I obtained verbal consent from Patient.  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   Depression    Elevated liver enzymes    Glaucoma    High triglycerides    History of gout    HIV (human immunodeficiency virus infection) (HCC)    Hypertension    Leukopenia    Low CD4   Multiple sclerosis (HCC)    uses a cane   Myocardial infarction (HCC)    45 years ago   Prostate cancer (HCC)     Assessment: Patient Reported Symptoms:  Cognitive Cognitive Status: Alert and oriented to person, place, and time Cognitive/Intellectual Conditions Management [RPT]: None reported or documented in medical history or problem list   Health Maintenance Behaviors: Stress management Health Facilitated by: Stress management  Neurological Neurological Review of Symptoms: Hearing changes, Weakness Neurological Management Strategies: Coping strategies, Counseling Neurological Comment: Has MS diagnosis  HEENT HEENT Symptoms Reported: Change or loss of hearing HEENT Management Strategies: Coping strategies  Some vision challenges; some hearing challenges  Cardiovascular Cardiovascular Symptoms Reported: Fatigue, Dizziness Cardiovascular Management Strategies: Adequate rest, Coping strategies  Respiratory Respiratory Symptoms Reported: No symptoms reported Respiratory Management Strategies: Adequate rest  Endocrine Endocrine Symptoms Reported: Blurry vision, Weakness or fatigue Is patient diabetic?: No    Gastrointestinal Gastrointestinal Symptoms Reported: No symptoms reported Gastrointestinal Management Strategies: Adequate rest, Coping strategies    Genitourinary  Genitourinary Symptoms Reported: No symptoms reported Genitourinary Management Strategies: Adequate rest, Coping strategies  Integumentary Integumentary Symptoms Reported: No symptoms reported Skin Management Strategies: Adequate rest, Coping strategies  Musculoskeletal Musculoskelatal Symptoms Reviewed: Limited mobility, Muscle pain, Unsteady gait, Weakness Musculoskeletal Management Strategies: Coping strategies Falls in the past year?: No  Diagnosis of MS  Psychosocial Psychosocial Symptoms Reported: No symptoms reported Behavioral Management Strategies: Activity, Adequate rest, Coping strategies, Counseling Major Change/Loss/Stressor/Fears (CP): Medical condition, self Techniques to Cope with Loss/Stress/Change: Counseling Quality of Family Relationships: unable to assess Do you feel physically threatened by others?: No      12/16/2023    4:07 PM  Depression screen PHQ 2/9  Decreased Interest 1  Down, Depressed, Hopeless 1  PHQ - 2 Score 2  Altered sleeping 1  Tired, decreased energy 1  Change in appetite 1  Feeling bad or failure about yourself  0  Trouble concentrating 1  Moving slowly or fidgety/restless 1  Suicidal thoughts 0  PHQ-9 Score 7  Difficult doing work/chores Somewhat difficult    Vitals:   BP is being monitored for client by PCP (Dr. Glendia Fielding) Medications Reviewed Today     Reviewed by Frances Ozell RAMAN, LCSW (Social Worker) on 12/16/23 at 1556  Med List Status: <None>   Medication Order Taking? Sig Documenting Provider Last Dose Status Informant  allopurinol  (ZYLOPRIM ) 100 MG tablet [id number] Yes TAKE ONE TABLET BY MOUTH ONCE DAILY FOR GOUT Luking, Scott A, MD  Active   amitriptyline  (ELAVIL ) 25 MG tablet [id number] Yes Take 50 mg by mouth at bedtime. [provider]  Active Family Member, Pharmacy Records  amLODipine  (NORVASC ) 5 MG tablet [id number] Yes TAKE ONE TABLET BY MOUTH ONCE DAILY HIGH BLOOD PRESSURE Fielding, Glendia  A, MD  Active    azelastine  (ASTELIN ) 0.1 % nasal spray [id number] Yes Place 2 sprays into both nostrils 2 (two) times daily. Use in each nostril as directed Tobie Eldora NOVAK, MD  Active   DESCOVY  200-25 MG tablet [id number] Yes Take 1 tablet by mouth daily. [provider]  Active Family Member, Pharmacy Records  ezetimibe  (ZETIA ) 10 MG tablet [id number] Yes TAKE ONE TABLET BY MOUTH ONCE DAILY Luking, Scott A, MD  Active   fluticasone  (FLONASE ) 50 MCG/ACT nasal spray [id number] Not taking Place 2 sprays into both nostrils in the morning and at bedtime.  Patient not taking: Reported on 12/16/2023   Tobie Eldora NOVAK, MD  Expired 10/30/23 2359   gabapentin  (NEURONTIN ) 100 MG capsule [id number] Yes Take 1 capsule by mouth 3 (three) times daily. [provider]  Active Family Member, Pharmacy Records  metFORMIN  (GLUCOPHAGE -XR) 500 MG 24 hr tablet [id number] Yes TAKE ONE TABLET BY MOUTH TWICE DAILY WITH A MEAL Luking, Scott A, MD  Active   pantoprazole  (PROTONIX ) 40 MG tablet [id number] Yes TAKE ONE TABLET BY MOUTH EVERY DAY Luking, Scott A, MD  Active   silodosin  (RAPAFLO ) 8 MG CAPS capsule [id number] Yes Take 1 capsule (8 mg total) by mouth at bedtime. McKenzie, Belvie CROME, MD  Active Family Member, Pharmacy Records  TIVICAY  50 MG tablet [id number] Yes Take 50 mg by mouth daily. [provider]  Active Family Member, Pharmacy Records             Recommendation:   PCP Follow-up Continue Current Plan of Care Take medications as prescribed Allow time for ADLs completion, for rest, and for eating meals Socialize with friends as client is able Call LCSW as needed for SW support  Follow Up Plan:   Telephone follow up appointment date/time:  01/20/24 at 10:00 AM   Glendia Pear  MSW, LCSW [place]/[facility] Care [facility] Licensed Clinical Social Worker Direct Dial:  [phone] Fax:  [phone] Website:  [email or web address]

## 2023-12-25 ENCOUNTER — Ambulatory Visit: Admitting: Family Medicine

## 2023-12-27 ENCOUNTER — Ambulatory Visit (INDEPENDENT_AMBULATORY_CARE_PROVIDER_SITE_OTHER): Admitting: Family Medicine

## 2023-12-27 VITALS — BP 134/78 | HR 48 | Temp 98.6°F | Ht 69.0 in | Wt 194.2 lb

## 2023-12-27 DIAGNOSIS — M2550 Pain in unspecified joint: Secondary | ICD-10-CM | POA: Diagnosis not present

## 2023-12-27 DIAGNOSIS — M791 Myalgia, unspecified site: Secondary | ICD-10-CM | POA: Diagnosis not present

## 2023-12-27 DIAGNOSIS — C61 Malignant neoplasm of prostate: Secondary | ICD-10-CM

## 2023-12-27 DIAGNOSIS — Z79899 Other long term (current) drug therapy: Secondary | ICD-10-CM | POA: Diagnosis not present

## 2023-12-27 DIAGNOSIS — E1169 Type 2 diabetes mellitus with other specified complication: Secondary | ICD-10-CM | POA: Diagnosis not present

## 2023-12-27 DIAGNOSIS — E785 Hyperlipidemia, unspecified: Secondary | ICD-10-CM | POA: Diagnosis not present

## 2023-12-27 DIAGNOSIS — Z7984 Long term (current) use of oral hypoglycemic drugs: Secondary | ICD-10-CM | POA: Diagnosis not present

## 2023-12-27 DIAGNOSIS — I10 Essential (primary) hypertension: Secondary | ICD-10-CM
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
## 2023-12-28 ENCOUNTER — Ambulatory Visit: Payer: Self-pay | Admitting: Family Medicine

## 2023-12-28 LAB — HEMOGLOBIN A1C
Est. average glucose Bld gHb Est-mCnc: 131 mg/dL
Hgb A1c MFr Bld: 6.2 % — ABNORMAL HIGH (ref 4.8–5.6)

## 2023-12-28 LAB — LIPID PANEL
Chol/HDL Ratio: 4.3 ratio (ref 0.0–5.0)
Cholesterol, Total: 163 mg/dL (ref 100–199)
HDL: 38 mg/dL — ABNORMAL LOW (ref >39)
LDL Chol Calc (NIH): 107 mg/dL — ABNORMAL HIGH (ref 0–99)
Triglycerides: 97 mg/dL (ref 0–149)
VLDL Cholesterol Cal: 18 mg/dL (ref 5–40)

## 2023-12-28 LAB — BASIC METABOLIC PANEL WITH GFR
BUN/Creatinine Ratio: 11 (ref 10–24)
BUN: 11 mg/dL (ref 8–27)
CO2: 19 mmol/L — ABNORMAL LOW (ref 20–29)
Calcium: 9.6 mg/dL (ref 8.6–10.2)
Chloride: 105 mmol/L (ref 96–106)
Creatinine, Ser: 1.03 mg/dL (ref 0.76–1.27)
Glucose: 102 mg/dL — ABNORMAL HIGH (ref 70–99)
Potassium: 4.7 mmol/L (ref 3.5–5.2)
Sodium: 143 mmol/L (ref 134–144)
eGFR: 73 mL/min/1.73 (ref >59)

## 2023-12-28 LAB — CK: Total CK: 211 U/L (ref 41–331)

## 2023-12-28 LAB — HEPATIC FUNCTION PANEL
ALT: 19 IU/L (ref 0–44)
AST: 25 IU/L (ref 0–40)
Albumin: 4.8 g/dL (ref 3.8–4.8)
Alkaline Phosphatase: 78 IU/L (ref 44–121)
Bilirubin Total: 0.4 mg/dL (ref 0.0–1.2)
Bilirubin, Direct: 0.14 mg/dL (ref 0.00–0.40)
Total Protein: 6.9 g/dL (ref 6.0–8.5)

## 2023-12-28 LAB — SEDIMENTATION RATE: Sed Rate: 6 mm/h (ref 0–30)

## 2023-12-30 ENCOUNTER — Other Ambulatory Visit: Payer: Self-pay

## 2023-12-30 MED ORDER — METFORMIN HCL ER 500 MG PO TB24: 500.0000 mg | ORAL_TABLET | Freq: Every day | ORAL | 3 refills | Status: AC

## 2024-01-06 ENCOUNTER — Other Ambulatory Visit: Payer: Self-pay | Admitting: Family Medicine

## 2024-01-06 DIAGNOSIS — K219 Gastro-esophageal reflux disease without esophagitis: Secondary | ICD-10-CM

## 2024-01-20 ENCOUNTER — Other Ambulatory Visit: Payer: Self-pay | Admitting: Licensed Clinical Social Worker

## 2024-01-23 ENCOUNTER — Ambulatory Visit: Admitting: Family Medicine

## 2024-02-21 ENCOUNTER — Other Ambulatory Visit: Payer: Medicare Other

## 2024-02-21 DIAGNOSIS — C61 Malignant neoplasm of prostate: Secondary | ICD-10-CM

## 2024-02-22 LAB — PSA: Prostate Specific Ag, Serum: <0.1 ng/mL (ref 0.0–4.0)

## 2024-02-25 ENCOUNTER — Ambulatory Visit: Payer: Self-pay | Admitting: Urology

## 2024-02-25 ENCOUNTER — Other Ambulatory Visit: Payer: Self-pay | Admitting: Family Medicine

## 2024-02-25 ENCOUNTER — Other Ambulatory Visit: Payer: Self-pay | Admitting: Urology

## 2024-02-28 ENCOUNTER — Ambulatory Visit: Payer: Medicare Other | Admitting: Urology

## 2024-02-28 ENCOUNTER — Encounter: Payer: Self-pay | Admitting: Urology

## 2024-02-28 ENCOUNTER — Telehealth: Payer: Self-pay

## 2024-02-28 VITALS — BP 174/72 | HR 58

## 2024-02-28 DIAGNOSIS — C61 Malignant neoplasm of prostate: Secondary | ICD-10-CM | POA: Diagnosis not present

## 2024-02-28 DIAGNOSIS — N401 Enlarged prostate with lower urinary tract symptoms: Secondary | ICD-10-CM | POA: Diagnosis not present

## 2024-02-28 DIAGNOSIS — N138 Other obstructive and reflux uropathy: Secondary | ICD-10-CM

## 2024-02-28 DIAGNOSIS — R351 Nocturia: Secondary | ICD-10-CM | POA: Diagnosis not present

## 2024-02-28 MED ORDER — SILODOSIN 8 MG PO CAPS: 8.0000 mg | ORAL_CAPSULE | Freq: Every day | ORAL | 11 refills | Status: AC

## 2024-02-28 NOTE — Patient Instructions (Signed)

## 2024-02-28 NOTE — Patient Outreach (Signed)
 Complex Care Management Care Guide Note  02/28/2024 Name: Kerry Solis MRN: [id number] DOB: 02-09-1946  Kerry Solis is a 78 y.o. year old male who is a primary care patient of Luking, Glendia LABOR, MD and is actively engaged with the care management team. I reached out to Kerry Solis by phone today to assist with re-scheduling  with the Licensed Clinical Child psychotherapist.  Follow up plan: Unsuccessful telephone outreach attempt made. A HIPAA compliant phone message was left for the patient providing contact information and requesting a return call.  Shereen Gin [facility]  Population Health VBCI Assistant Direct Dial: [phone]  Fax: [phone] Website: [email or web address]

## 2024-02-28 NOTE — Progress Notes (Signed)
 02/28/2024 10:53 AM   Kerry Solis July 07, 1945 [id number]  Referring provider: Alphonsa Glendia LABOR, MD [address] [place],  [place] [address]  Followup prostate cancer and BPH   HPI: Mr Kerry Solis is a 78yo here for followup fro prostate cancer and BPH. PSA remains undetectable. IPSS 12 QOL 1 on rapalfo 8mg  daily. Nocturia 1-2x. Uirne stream strong. No straining to urinate. No urinary hesitancy.    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   Depression    Elevated liver enzymes    Glaucoma    High triglycerides    History of gout    HIV (human immunodeficiency virus infection) (HCC)    Hypertension    Leukopenia    Low CD4   Multiple sclerosis (HCC)    uses a cane   Myocardial infarction (HCC)    45 years ago   Prostate cancer [facility])     Surgical History: Past Surgical History:  Procedure Laterality Date   BIOPSY  08/14/2016   Procedure: BIOPSY;  Surgeon: [name], MD;  Location: [facility];  Service: Endoscopy;;  gastric bx's   CHOLECYSTECTOMY  06/10/2011   Procedure: LAPAROSCOPIC CHOLECYSTECTOMY;  Surgeon: [name], MD;  Location: [facility] ORS;  Service: General;  Laterality: N/A;   COLONOSCOPY  03/10/2009   DOQ:fnizmjuz internal hemorrhoids/6-mm sessile ascending colon polyp/tortuous colon, diverticulosis. TA   COLONOSCOPY WITH PROPOFOL  N/A 04/19/2015   Dr. Haddock: sessile serrated adenoma, surveillance in 5 years    ESOPHAGOGASTRODUODENOSCOPY  06/23/2008   DOQ:rymnwpr gastritis/schatzki ring   ESOPHAGOGASTRODUODENOSCOPY (EGD) WITH PROPOFOL  N/A 08/14/2016   Dr. Haddock: moderate Schatzi's ring s/p dilation, LA Grade A esophagitis,small hiatal hernia, chronic gastritis (negative H.pylori), one duodenal diverticulum   LAPAROSCOPIC APPENDECTOMY N/A 12/13/2017   Procedure: APPENDECTOMY LAPAROSCOPIC;  Surgeon: [name], MD;  Location: [facility] OR[facility];  Service: General;   Laterality: N/A;   NECK SURGERY  unsure   disc   POLYPECTOMY N/A 04/19/2015   Procedure: POLYPECTOMY;  Surgeon: [name], MD;  Location: [facility];  Service: Endoscopy;  Laterality: N/A;  ascending colon   PROSTATE BIOPSY     RADIOACTIVE SEED IMPLANT N/A 11/29/2016   Procedure: RADIOACTIVE SEED IMPLANT/BRACHYTHERAPY IMPLANT;  Surgeon: [name], MD;  Location: [facility];  Service: Urology;  Laterality: N/A;  81 seeds implanted   SAVORY DILATION N/A 08/14/2016   Procedure: SAVORY DILATION;  Surgeon: [name], MD;  Location: [facility];  Service: Endoscopy;  Laterality: N/A;    Home Medications:  Allergies as of 02/28/2024       Reactions   Pravastatin     myalgias, elevated CK   Yellow Jacket Venom Swelling        Medication List        Accurate as of February 28, 2024 10:53 AM. If you have any questions, ask your nurse or doctor.          allopurinol  100 MG tablet Commonly known as: ZYLOPRIM  TAKE ONE TABLET BY MOUTH ONCE DAILY FOR GOUT   amitriptyline  25 MG tablet Commonly known as: ELAVIL  Take 50 mg by mouth at bedtime.   amLODipine  5 MG tablet Commonly known as: NORVASC  TAKE ONE TABLET BY MOUTH ONCE DAILY FOR HIGH BLOOD PRESSURE   azelastine  0.1 % nasal spray Commonly known as: ASTELIN  Place 2 sprays into both nostrils 2 (two) times  daily. Use in each nostril as directed   Descovy  200-25 MG tablet Generic drug: emtricitabine -tenofovir  AF Take 1 tablet by mouth daily.   ezetimibe  10 MG tablet Commonly known as: ZETIA  TAKE ONE TABLET BY MOUTH ONCE DAILY   fluticasone  50 MCG/ACT nasal spray Commonly known as: FLONASE  Place 2 sprays into both nostrils in the morning and at bedtime.   gabapentin  100 MG capsule Commonly known as: NEURONTIN  Take 1 capsule by mouth 3 (three) times daily.   metFORMIN  500 MG 24 hr tablet Commonly known as: GLUCOPHAGE -XR Take 1 tablet (500 mg total) by mouth daily with breakfast.    pantoprazole  40 MG tablet Commonly known as: PROTONIX  TAKE ONE TABLET BY MOUTH ONCE DAILY   silodosin  8 MG Caps capsule Commonly known as: RAPAFLO  TAKE ONE CAPSULE BY MOUTH AT BEDTIME   Tivicay  50 MG tablet Generic drug: dolutegravir  Take 50 mg by mouth daily.        Allergies:  Allergies  Allergen Reactions   Pravastatin      myalgias, elevated CK   Yellow Jacket Venom Swelling    Family History: Family History  Problem Relation Age of Onset   Hypertension Mother    Heart attack Mother    Heart attack Father    Cancer Sister        Breast   Diabetes Sister    Diabetes Brother    Cancer Brother        unknown   Cancer Brother        colon   Cancer Sister        breast   Cancer Brother        prostate   Cancer Brother        colon    Social History:  reports that he has never smoked. He has never used smokeless tobacco. He reports that he does not drink alcohol and does not use drugs.  ROS: All other review of systems were reviewed and are negative except what is noted above in HPI  Physical Exam: BP (!) 174/72   Pulse (!) 58   Constitutional:  Alert and oriented, No acute distress. HEENT: [place] AT, moist mucus membranes.  Trachea midline, no masses. Cardiovascular: No clubbing, cyanosis, or edema. Respiratory: Normal respiratory effort, no increased work of breathing. GI: Abdomen is soft, nontender, nondistended, no abdominal masses GU: No CVA tenderness.  Lymph: No cervical or inguinal lymphadenopathy. Skin: No rashes, bruises or suspicious lesions. Neurologic: Grossly intact, no focal deficits, moving all 4 extremities. Psychiatric: Normal mood and affect.  Laboratory Data: Lab Results  Component Value Date   WBC 4.9 08/08/2023   HGB 14.3 08/08/2023   HCT 43.3 08/08/2023   MCV 88 08/08/2023   PLT 268 08/08/2023    Lab Results  Component Value Date   CREATININE 1.03 12/27/2023    Lab Results  Component Value Date   PSA 2.39 04/30/2014    PSA 1.89 04/14/2013    No results found for: TESTOSTERONE  Lab Results  Component Value Date   HGBA1C 6.2 (H) 12/27/2023    Urinalysis    Component Value Date/Time   COLORURINE STRAW (A) 04/05/2023 1742   APPEARANCEUR CLEAR 04/05/2023 1742   APPEARANCEUR Clear 01/17/2022 1547   LABSPEC 1.011 04/05/2023 1742   PHURINE 6.0 04/05/2023 1742   GLUCOSEU NEGATIVE 04/05/2023 1742   HGBUR SMALL (A) 04/05/2023 1742   BILIRUBINUR NEGATIVE 04/05/2023 1742   BILIRUBINUR Negative 01/17/2022 1547   KETONESUR NEGATIVE 04/05/2023 1742   PROTEINUR  100 (A) 04/05/2023 1742   UROBILINOGEN 0.2 10/28/2012 1444   NITRITE NEGATIVE 04/05/2023 1742   LEUKOCYTESUR NEGATIVE 04/05/2023 1742    Lab Results  Component Value Date   LABMICR 26.1 12/17/2022   WBCUA None seen 01/17/2022   LABEPIT None seen 01/17/2022   MUCUS Present 12/27/2020   BACTERIA NONE SEEN 04/05/2023    Pertinent Imaging:  No results found for this or any previous visit.  No results found for this or any previous visit.  No results found for this or any previous visit.  No results found for this or any previous visit.  No results found for this or any previous visit.  No results found for this or any previous visit.  No results found for this or any previous visit.  No results found for this or any previous visit.   Assessment & Plan:    1. Prostate cancer (HCC) (Primary) Followup1 year with a PSA - Urinalysis, Routine w reflex microscopic  2. Benign prostatic hyperplasia with urinary obstruction Continue rapaflo  8mg  daily  3. Nocturia Rapalfo 8mg  daily   No follow-ups on file.  Belvie Clara, MD  [facility] [place]

## 2024-03-04 NOTE — Patient Outreach (Signed)
 Complex Care Management Care Guide Note  03/04/2024 Name: Kerry Solis MRN: [id number] DOB: Jan 11, 1946  Kerry Solis is a 78 y.o. year old male who is a primary care patient of Luking, Glendia LABOR, MD and is actively engaged with the care management team. I reached out to Kerry Solis by phone today to assist with re-scheduling  with the Licensed Clinical Child psychotherapist.  Follow up plan: 3rd Unsuccessful telephone outreach attempt made. A HIPAA compliant phone message was left for the patient providing contact information and requesting a return call. No further outreach attempts will be made at this time.   Shereen Gin [facility]  Population Health VBCI Assistant Direct Dial: [phone]  Fax: [phone] Website: [email or web address]

## 2024-03-25 DIAGNOSIS — G35D Multiple sclerosis, unspecified: Secondary | ICD-10-CM | POA: Diagnosis not present

## 2024-03-25 DIAGNOSIS — Z79899 Other long term (current) drug therapy: Secondary | ICD-10-CM | POA: Diagnosis not present

## 2024-03-25 DIAGNOSIS — B451 Cerebral cryptococcosis: Secondary | ICD-10-CM | POA: Diagnosis not present

## 2024-03-25 DIAGNOSIS — I10 Essential (primary) hypertension: Secondary | ICD-10-CM | POA: Diagnosis not present

## 2024-03-25 DIAGNOSIS — Z792 Long term (current) use of antibiotics: Secondary | ICD-10-CM | POA: Diagnosis not present

## 2024-03-29 ENCOUNTER — Emergency Department (HOSPITAL_COMMUNITY)

## 2024-03-29 ENCOUNTER — Other Ambulatory Visit: Payer: Self-pay

## 2024-03-29 ENCOUNTER — Emergency Department (HOSPITAL_COMMUNITY)
Admission: EM | Admit: 2024-03-29 | Discharge: 2024-03-29 | Disposition: A | Discharge status: Home / Self Care | Attending: Emergency Medicine | Admitting: Emergency Medicine

## 2024-03-29 DIAGNOSIS — R531 Weakness: Secondary | ICD-10-CM | POA: Insufficient documentation

## 2024-03-29 DIAGNOSIS — R109 Unspecified abdominal pain: Secondary | ICD-10-CM | POA: Diagnosis not present

## 2024-03-29 DIAGNOSIS — N200 Calculus of kidney: Secondary | ICD-10-CM | POA: Diagnosis not present

## 2024-03-29 DIAGNOSIS — K222 Esophageal obstruction: Secondary | ICD-10-CM

## 2024-03-29 DIAGNOSIS — R1084 Generalized abdominal pain: Secondary | ICD-10-CM

## 2024-03-29 DIAGNOSIS — N2889 Other specified disorders of kidney and ureter: Secondary | ICD-10-CM | POA: Diagnosis not present

## 2024-03-29 DIAGNOSIS — I700 Atherosclerosis of aorta: Secondary | ICD-10-CM | POA: Diagnosis not present

## 2024-03-29 DIAGNOSIS — K5730 Diverticulosis of large intestine without perforation or abscess without bleeding: Secondary | ICD-10-CM | POA: Diagnosis not present

## 2024-03-29 LAB — CBC WITH DIFFERENTIAL/PLATELET
Abs Immature Granulocytes: 0.01 K/uL (ref 0.00–0.07)
Basophils Absolute: 0 K/uL (ref 0.0–0.1)
Basophils Relative: 1 %
Eosinophils Absolute: 0 K/uL (ref 0.0–0.5)
Eosinophils Relative: 1 %
HCT: 44 % (ref 39.0–52.0)
Hemoglobin: 14.1 g/dL (ref 13.0–17.0)
Immature Granulocytes: 0 %
Lymphocytes Relative: 52 %
Lymphs Abs: 2 K/uL (ref 0.7–4.0)
MCH: 28.7 pg (ref 26.0–34.0)
MCHC: 32 g/dL (ref 30.0–36.0)
MCV: 89.6 fL (ref 80.0–100.0)
Monocytes Absolute: 0.4 K/uL (ref 0.1–1.0)
Monocytes Relative: 10 %
Neutro Abs: 1.4 K/uL — ABNORMAL LOW (ref 1.7–7.7)
Neutrophils Relative %: 36 %
Platelets: 260 K/uL (ref 150–400)
RBC: 4.91 MIL/uL (ref 4.22–5.81)
RDW: 15.1 % (ref 11.5–15.5)
WBC: 3.8 K/uL — ABNORMAL LOW (ref 4.0–10.5)
nRBC: 0 % (ref 0.0–0.2)

## 2024-03-29 LAB — COMPREHENSIVE METABOLIC PANEL WITH GFR
ALT: 22 U/L (ref 0–44)
AST: 31 U/L (ref 15–41)
Albumin: 5.1 g/dL — ABNORMAL HIGH (ref 3.5–5.0)
Alkaline Phosphatase: 76 U/L (ref 38–126)
Anion gap: 12 (ref 5–15)
BUN: 13 mg/dL (ref 8–23)
CO2: 26 mmol/L (ref 22–32)
Calcium: 10.3 mg/dL (ref 8.9–10.3)
Chloride: 105 mmol/L (ref 98–111)
Creatinine, Ser: 1.28 mg/dL — ABNORMAL HIGH (ref 0.61–1.24)
GFR, Estimated: 57 mL/min — ABNORMAL LOW (ref >60)
Glucose, Bld: 103 mg/dL — ABNORMAL HIGH (ref 70–99)
Potassium: 4.4 mmol/L (ref 3.5–5.1)
Sodium: 143 mmol/L (ref 135–145)
Total Bilirubin: 0.3 mg/dL (ref 0.0–1.2)
Total Protein: 7.8 g/dL (ref 6.5–8.1)

## 2024-03-29 LAB — URINALYSIS, ROUTINE W REFLEX MICROSCOPIC
Bilirubin Urine: NEGATIVE
Glucose, UA: NEGATIVE mg/dL
Hgb urine dipstick: NEGATIVE
Ketones, ur: NEGATIVE mg/dL
Leukocytes,Ua: NEGATIVE
Nitrite: NEGATIVE
Protein, ur: NEGATIVE mg/dL
Specific Gravity, Urine: 1.01 (ref 1.005–1.030)
pH: 7 (ref 5.0–8.0)

## 2024-03-29 LAB — LIPASE, BLOOD: Lipase: 77 U/L — ABNORMAL HIGH (ref 11–51)

## 2024-03-29 LAB — TROPONIN T, HIGH SENSITIVITY: Troponin T High Sensitivity: <15 ng/L (ref 0–19)

## 2024-03-29 MED ORDER — MORPHINE SULFATE (PF) 4 MG/ML IV SOLN
4.0000 mg | Freq: Once | INTRAVENOUS | Status: AC
  Administered 2024-03-29: 4 mg via INTRAVENOUS
  Filled 2024-03-29: qty 1

## 2024-03-29 MED ORDER — ONDANSETRON HCL 4 MG/2ML IJ SOLN
4.0000 mg | Freq: Once | INTRAMUSCULAR | Status: AC
  Administered 2024-03-29: 4 mg via INTRAVENOUS
  Filled 2024-03-29: qty 2

## 2024-03-29 MED ORDER — ONDANSETRON 4 MG PO TBDP: 4.0000 mg | ORAL_TABLET | Freq: Three times a day (TID) | ORAL | 0 refills | Status: AC | PRN

## 2024-03-29 MED ORDER — SODIUM CHLORIDE 0.9 % IV BOLUS
500.0000 mL | Freq: Once | INTRAVENOUS | Status: AC
  Administered 2024-03-29: 500 mL via INTRAVENOUS

## 2024-03-29 MED ORDER — PANTOPRAZOLE SODIUM 40 MG PO TBEC: 40.0000 mg | DELAYED_RELEASE_TABLET | Freq: Every day | ORAL | 0 refills | Status: DC

## 2024-03-29 MED ORDER — IOHEXOL 300 MG/ML  SOLN
100.0000 mL | Freq: Once | INTRAMUSCULAR | Status: AC | PRN
Start: 2024-03-29 — End: 2024-03-29
  Administered 2024-03-29: 100 mL via INTRAVENOUS

## 2024-03-29 NOTE — ED Notes (Signed)
 ED Provider at bedside.

## 2024-03-29 NOTE — Discharge Instructions (Signed)
 You were seen in the emergency department for general weakness and abdominal pain.  You had lab work EKG chest x-ray and a CAT scan of your abdomen and pelvis that did not show an obvious explanation for your symptoms.  Please reach out to your neurologist and primary care doctor for close follow-up.  Return to the emergency department if any worsening or concerning symptoms.

## 2024-03-29 NOTE — ED Notes (Signed)
 Pt/family received d/c paperwork at this time. After going over the paperwork any questions, comments, or concerns were answered to the best of this nurse's knowledge. The pt/family verbally acknowledged the teachings/instructions.

## 2024-03-29 NOTE — ED Triage Notes (Signed)
 Patient come in POV, from home ambulated with cane to exam room. Patient complaint of abdominal pain that radiates to back, dr heaving last night, having weakness. LBM this morning. Pain 9/10.

## 2024-03-29 NOTE — ED Provider Notes (Signed)
 [place] EMERGENCY DEPARTMENT AT [facility] Provider Note   CSN: [id number] Arrival date & time: 03/29/24  1047     Patient presents with: Abdominal Pain, Back Pain, and Weakness   Kerry Solis is a 78 y.o. male.  He is here with abdominal pain and distention, poor appetite, feeling generally weak that is been going on for a while but worse over the last few days.  He has pain in his upper back and shoulders.  Having more difficulty ambulating and feeling more unsteady.  He attributes it to a medication change for his MS medication, he used to get it every 6 months and now its every 9 months.  He has talked to his neurologist about it but they cannot get him into the clinic until December.  No fevers or chills.  No chest pain or shortness of breath.  Has had some dry heaves.  Moving his bowels okay.  No dysuria.   The history is provided by the patient.  Abdominal Pain Pain location:  Generalized Pain quality: aching   Pain severity:  Moderate Onset quality:  Gradual Timing:  Constant Progression:  Worsening Context: not trauma   Associated symptoms: anorexia, fatigue and nausea   Associated symptoms: no chest pain, no constipation, no cough, no diarrhea, no dysuria, no fever and no hematemesis   Back Pain Associated symptoms: abdominal pain and weakness   Associated symptoms: no chest pain, no dysuria and no fever   Weakness Associated symptoms: abdominal pain, anorexia and nausea   Associated symptoms: no chest pain, no cough, no diarrhea, no dysuria and no fever        Prior to Admission medications   Medication Sig Start Date End Date Taking? Authorizing Provider  allopurinol  (ZYLOPRIM ) 100 MG tablet TAKE ONE TABLET BY MOUTH ONCE DAILY FOR GOUT 02/25/24   Alphonsa Glendia LABOR, MD  amLODipine  (NORVASC ) 5 MG tablet TAKE ONE TABLET BY MOUTH ONCE DAILY FOR HIGH BLOOD PRESSURE 01/06/24   Alphonsa Glendia LABOR, MD  azelastine  (ASTELIN ) 0.1 % nasal spray Place 2 sprays into  both nostrils 2 (two) times daily. Use in each nostril as directed 09/30/23   Tobie Eldora NOVAK, MD  DESCOVY  200-25 MG tablet Take 1 tablet by mouth daily. 03/06/21   [provider]  ezetimibe  (ZETIA ) 10 MG tablet TAKE ONE TABLET BY MOUTH ONCE DAILY 11/05/23   Alphonsa Glendia LABOR, MD  fluticasone  (FLONASE ) 50 MCG/ACT nasal spray Place 2 sprays into both nostrils in the morning and at bedtime. 09/30/23 12/27/23  Tobie Eldora NOVAK, MD  gabapentin  (NEURONTIN ) 100 MG capsule Take 1 capsule by mouth 3 (three) times daily. 04/25/22   [provider]  metFORMIN  (GLUCOPHAGE -XR) 500 MG 24 hr tablet Take 1 tablet (500 mg total) by mouth daily with breakfast. 12/30/23   Alphonsa Glendia LABOR, MD  pantoprazole  (PROTONIX ) 40 MG tablet TAKE ONE TABLET BY MOUTH ONCE DAILY 01/06/24   Alphonsa Glendia LABOR, MD  silodosin  (RAPAFLO ) 8 MG CAPS capsule Take 1 capsule (8 mg total) by mouth at bedtime. 02/28/24   McKenzie, Belvie LITTIE, MD  TIVICAY  50 MG tablet Take 50 mg by mouth daily. 03/06/21   [provider]    Allergies: Pravastatin  and Yellow jacket venom    Review of Systems  Constitutional:  Positive for fatigue. Negative for fever.  Respiratory:  Negative for cough.   Cardiovascular:  Negative for chest pain.  Gastrointestinal:  Positive for abdominal pain, anorexia and nausea. Negative for constipation, diarrhea  and hematemesis.  Genitourinary:  Negative for dysuria.  Musculoskeletal:  Positive for back pain.  Neurological:  Positive for weakness.    Updated Vital Signs BP (!) 167/112   Pulse 73   Temp 98.1 F (36.7 C) (Oral)   Ht 5' 9 (1.753 m)   Wt 95.3 kg   SpO2 97%   BMI 31.01 kg/m   Physical Exam Vitals and nursing note reviewed.  Constitutional:      Appearance: He is well-developed.  HENT:     Head: Normocephalic and atraumatic.  Eyes:     Conjunctiva/sclera: Conjunctivae normal.  Cardiovascular:     Rate and Rhythm: Normal rate and regular rhythm.     Heart sounds: No murmur  heard. Pulmonary:     Effort: Pulmonary effort is normal. No respiratory distress.     Breath sounds: Normal breath sounds.  Abdominal:     Palpations: Abdomen is soft.     Tenderness: There is no abdominal tenderness. There is no guarding or rebound.  Musculoskeletal:     Cervical back: Neck supple.  Skin:    General: Skin is warm and dry.  Neurological:     General: No focal deficit present.     Mental Status: He is alert.     GCS: GCS eye subscore is 4. GCS verbal subscore is 5. GCS motor subscore is 6.     (all labs ordered are listed, but only abnormal results are displayed) Labs Reviewed  COMPREHENSIVE METABOLIC PANEL WITH GFR - Abnormal; Notable for the following components:      Result Value   Glucose, Bld 103 (*)    Creatinine, Ser 1.28 (*)    Albumin 5.1 (*)    GFR, Estimated 57 (*)    All other components within normal limits  LIPASE, BLOOD - Abnormal; Notable for the following components:   Lipase 77 (*)    All other components within normal limits  CBC WITH DIFFERENTIAL/PLATELET - Abnormal; Notable for the following components:   WBC 3.8 (*)    Neutro Abs 1.4 (*)    All other components within normal limits  URINALYSIS, ROUTINE W REFLEX MICROSCOPIC - Abnormal; Notable for the following components:   Color, Urine STRAW (*)    All other components within normal limits  TROPONIN T, HIGH SENSITIVITY    EKG: EKG Interpretation Date/Time:  Sunday March 29 2024 11:47:33 EDT Ventricular Rate:  61 PR Interval:  279 QRS Duration:  96 QT Interval:  479 QTC Calculation: 483 R Axis:   -46  Text Interpretation: Sinus rhythm Prolonged PR interval Left anterior fascicular block Abnormal R-wave progression, late transition Borderline T abnormalities, diffuse leads Borderline prolonged QT interval Confirmed by Towana Sharper [phone]) on 03/29/2024 11:53:25 AM  Radiology: CT ABDOMEN PELVIS W CONTRAST Result Date: 03/29/2024 EXAM: CT ABDOMEN AND PELVIS WITH CONTRAST  03/29/2024 12:51:48 PM TECHNIQUE: CT of the abdomen and pelvis was performed with the administration of 100 mL of intravenous iohexol  (OMNIPAQUE ) 300 MG/ML solution. Multiplanar reformatted images are provided for review. Automated exposure control, iterative reconstruction, and/or weight-based adjustment of the mA/kV was utilized to reduce the radiation dose to as low as reasonably achievable. COMPARISON: 01/16/2023 CLINICAL HISTORY: Abdominal pain, acute, nonlocalized. Abd pain; Omnipaque  300; 100 cc ; come in POV, from home ambulated with cane to exam room. Patient complaint of abdominal pain that radiates to back, dr heaving last night, having weakness. LBM this morning. Pain 9/10. FINDINGS: LOWER CHEST: Interval clearing of previous right lower lobe non-solid  nodule compatible with an inflammatory or infectious process. No acute findings within the imaged portions of the lung bases. LIVER: There is no suspicious liver abnormality. GALLBLADDER AND BILE DUCTS: Status post cholecystectomy. No biliary ductal dilatation. SPLEEN: No acute abnormality. PANCREAS: The pancreas is normal in size and contour without focal lesion or ductal dilatation. ADRENAL GLANDS: Within normal limits in size and appearance. Normal size and morphology bilaterally. No nodule, thickening, or hemorrhage. No periadrenal stranding. KIDNEYS, URETERS AND BLADDER: Bosniak class 1 cyst arises off the lower pole of the left kidney measuring 2.7 cm, image 83/4. 1 cm cortical-based cyst within the lower pole of the right kidney measures 62 hounsfield units. Image 76/4. On the previous exam this measured 0.9 cm and 34 hounsfield units. This is indeterminate coronal image 76/4. Lower pole left kidney stone measures 6 mm. Focal scarring involving the lateral cortex of the interpolar right kidney with foci of dystrophic calcification. Per consensus, no follow-up is needed for simple Bosniak type 1 and 2 renal cysts, unless the patient has a malignancy  history or risk factors. No stones in the ureters. No hydronephrosis. No perinephric or periureteral stranding. Urinary bladder is unremarkable. GI AND BOWEL: Stomach demonstrates no acute abnormality. Status post appendectomy. Mild colonic stool burden. Sigmoid colon demonstrates diverticulosis without evidence of diverticulitis. No bowel wall thickening, pericolonic stranding, abscess, or free air. There is no bowel obstruction. PERITONEUM AND RETROPERITONEUM: No free fluid or fluid collections. No free air. VASCULATURE: Aorta is normal in caliber. Aortic atherosclerotic calcification. Patent abdominal vascularity. LYMPH NODES: No lymphadenopathy. REPRODUCTIVE ORGANS: See the implants identified within the prostate gland. Bilateral hydroceles noted. BONES AND SOFT TISSUES: No acute or suspicious osseous abnormality. Moderate degenerative disc disease noted at L2-3 with vacuum disc and endplate spurring. Small bilateral fat-containing inguinal hernias. Multiple calcified nodules within the lower ventral abdominal wall which likely represent injection granulomas. IMPRESSION: 1. No acute findings in the abdomen or pelvis to explain abdominal pain. 2. Indeterminate 1 cm right lower pole renal cortical lesion with increased attenuation (62 HU) and slight interval growth from prior (previously 0.9 cm, 34 HU); recommend nonemergent renal protocol CT or MRI for characterization. 3. Sigmoid colon demonstrates diverticulosis without evidence of diverticulitis. No bowel wall thickening, pericolonic stranding, abscess, or free air. 4. Aortic atherosclerotic calcification. Electronically signed by: Waddell Calk MD 03/29/2024 01:29 PM EDT RP Workstation: [id number]   DG Chest Port 1 View Result Date: 03/29/2024 CLINICAL DATA:  Weakness. EXAM: PORTABLE CHEST 1 VIEW COMPARISON:  04/05/2023. FINDINGS: The heart size and mediastinal contours are within normal limits. Aortic atherosclerosis. No focal consolidation, pleural  effusion, or pneumothorax. No acute osseous abnormality. IMPRESSION: 1. No acute cardiopulmonary findings. 2.  Aortic Atherosclerosis (ICD10-I70.0). Electronically Signed   By: Harrietta Sherry M.D.   On: 03/29/2024 11:44     Procedures   Medications Ordered in the ED  ondansetron  (ZOFRAN ) injection 4 mg (4 mg Intravenous Given 03/29/24 1202)  sodium chloride  0.9 % bolus 500 mL (0 mLs Intravenous Stopped 03/29/24 1300)  morphine  (PF) 4 MG/ML injection 4 mg (4 mg Intravenous Given 03/29/24 1203)  iohexol  (OMNIPAQUE ) 300 MG/ML solution 100 mL (100 mLs Intravenous Contrast Given 03/29/24 1244)    Clinical Course as of 03/29/24 1656  Sun Mar 29, 2024  1136 Chest x-ray interpreted by me as no acute infiltrate.  Awaiting radiology reading. [MB]  1339 Nothing definitive on his workup.  CT showing mild constipation nothing acute.  Lab work was fairly baseline for him.  Reviewed this with patient.  Recommended close follow-up with his neurology team. [MB]    Clinical Course User Index [MB] Towana Ozell BROCKS, MD                                 Medical Decision Making Amount and/or Complexity of Data Reviewed Labs: ordered. Radiology: ordered.  Risk Prescription drug management.   This patient complains of feeling weak and unsteady, abdominal pain, poor appetite; this involves an extensive number of treatment Options and is a complaint that carries with it a high risk of complications and morbidity. The differential includes gastritis, peptic ulcer disease, diverticulitis, obstruction, constipation, neurologic  I ordered, reviewed and interpreted labs, which included CBC with low white count, chemistries with elevated creatinine, urinalysis without signs of infection, troponin flat I ordered medication IV fluids and nausea medicine pain medicine and reviewed PMP when indicated. I ordered imaging studies which included chest x-ray, CT abdomen and pelvis and I independently    visualized  and interpreted imaging which showed no acute findings Previous records obtained and reviewed in epic including recent PCP and ID notes Cardiac monitoring reviewed, sinus rhythm Social determinants considered, stress Critical Interventions: None  After the interventions stated above, I reevaluated the patient and found patient to have a benign abdominal exam and hemodynamically stable Admission and further testing considered, no indications for admission at this time.  Recommended close follow-up with his PCP and his neurologist.  Return instructions discussed.      Final diagnoses:  Generalized weakness  Generalized abdominal pain    ED Discharge Orders          Ordered    pantoprazole  (PROTONIX ) 40 MG tablet  Daily       Note to Pharmacy: This prescription was filled on 12/23/2023. Any refills authorized will be placed on file.   03/29/24 1344    ondansetron  (ZOFRAN -ODT) 4 MG disintegrating tablet  Every 8 hours PRN        03/29/24 1344               Betty Daidone C, MD 03/29/24 1658

## 2024-04-01 ENCOUNTER — Ambulatory Visit: Payer: Self-pay

## 2024-04-01 NOTE — Telephone Encounter (Signed)
 FYI Only or Action Required?: Action required by provider: request for appointment.  Patient was last seen in primary care on 12/27/2023 by Alphonsa Glendia LABOR, MD.  Called Nurse Triage reporting Abdominal Pain.  Symptoms began several days ago.  Interventions attempted: Nothing.  Symptoms are: unchanged.  Triage Disposition: See Physician Within 24 Hours  Patient/caregiver understands and will follow disposition?: YesCopied from CRM [id number]. Topic: Clinical - Red Word Triage >> Apr 01, 2024  1:32 PM Kerry Solis wrote: Red Word that prompted transfer to Nurse Triage: patient was seen in the hospital for stomach pains 03/29/24. He is still in pain. Rate his pain 7/10. Reason for Disposition  [1] MODERATE pain (e.g., interferes with normal activities) AND [2] comes and goes (cramps) AND [3] present > 24 hours  (Exception: Pain with Vomiting or Diarrhea - see that Guideline.)  Answer Assessment - Initial Assessment Questions Pt went to ED Sunday for stomach pain. CAL ok'd pt to be seen by different provider tomorrow.     1. LOCATION: Where does it hurt?      Lower stomach 2. RADIATION: Does the pain shoot anywhere else? (e.g., chest, back)     Around to back  3. ONSET: When did the pain begin? (e.g., minutes, hours or days ago)      1 week  4. SUDDEN: Gradual or sudden onset?     na 5. PATTERN Does the pain come and go, or is it constant?     Intensity changes 6. SEVERITY: How bad is the pain?  (e.g., Scale 1-10; mild, moderate, or severe)     7 7. RECURRENT SYMPTOM: Have you ever had this type of stomach pain before? If Yes, ask: When was the last time? and What happened that time?      na 8. AGGRAVATING FACTORS: Does anything seem to cause this pain? (e.g., foods, stress, alcohol)     Lying down 9. CARDIAC SYMPTOMS: Do you have any of the following symptoms: chest pain, difficulty breathing, sweating, nausea?     nausea 10. OTHER SYMPTOMS: Do you have any  other symptoms? (e.g., back pain, diarrhea, fever, urination pain, vomiting)  Back pain, nauseated  Protocols used: Abdominal Pain - Upper-A-AH

## 2024-04-02 ENCOUNTER — Ambulatory Visit (INDEPENDENT_AMBULATORY_CARE_PROVIDER_SITE_OTHER): Admitting: Physician Assistant

## 2024-04-02 ENCOUNTER — Encounter: Payer: Self-pay | Admitting: Physician Assistant

## 2024-04-02 VITALS — BP 138/77 | HR 78 | Temp 98.6°F | Ht 69.0 in | Wt 200.0 lb

## 2024-04-02 DIAGNOSIS — R531 Weakness: Secondary | ICD-10-CM | POA: Diagnosis not present

## 2024-04-02 DIAGNOSIS — R1030 Lower abdominal pain, unspecified: Secondary | ICD-10-CM | POA: Diagnosis not present

## 2024-04-02 DIAGNOSIS — Z23 Encounter for immunization: Secondary | ICD-10-CM

## 2024-04-02 DIAGNOSIS — R748 Abnormal levels of other serum enzymes: Secondary | ICD-10-CM

## 2024-04-02 NOTE — Progress Notes (Signed)
   Subjective:    Patient ID: AMEET SANDY, male    DOB: [birth year], 78 y.o.   MRN: [id number]  HPI    Review of Systems     Objective:   Physical Exam        Assessment & Plan:

## 2024-04-02 NOTE — Assessment & Plan Note (Signed)
 Weakness worsening since March, coinciding with decreased MS medication. Exacerbated by activity, with mild shortness of breath. MS and aging are likely contributing factors. Neurology follow-up scheduled for December 17th. He appears stable today, no acute findings on physical exam.  - Monitor symptoms and report any significant changes to PCP, follow up with PCP as scheduled in December. - Warning signs and ER precautions discussed.

## 2024-04-02 NOTE — Assessment & Plan Note (Signed)
 Abdominal pain improving post-ER visit. CT scan showed no acute findings. Persistent nausea without vomiting or diarrhea. Bowel movements irregular but not constipated, no diarrhea, no blood in stool. - Repeat CMP and lipase to ensure normalization to baseline post-ER visit. - Continue Zofran  as needed for nausea. - Advise to return if symptoms worsen, such as increased abdominal pain, vomiting, diarrhea, or fever.

## 2024-04-02 NOTE — Telephone Encounter (Signed)
 Appointment scheduled.

## 2024-04-02 NOTE — Progress Notes (Signed)
 Acute Office Visit  Subjective:     Patient ID: Kerry Solis, male    DOB: [birth year], 78 y.o.   MRN: [id number]   Discussed the use of AI scribe software for clinical note transcription with the patient, who gave verbal consent to proceed.  History of Present Illness Kerry Solis is an 78 year old male with multiple sclerosis and HIV who presents with weakness and abdominal pain.  He has experienced progressive weakness since his multiple sclerosis medication was reduced in March. His next neurology appointment is scheduled for December. Earlier this week, he visited the ER for abdominal pain and weakness. The pain is located in the lower abdomen, below the umbilicus, with radiation to the back. A CT scan in the ER showed no infection or acute issue. Since ER visit and using Zofran  as needed, his abdominal pain has improved, but weakness and dyspnea on exertion persist.  He experiences nausea without emesis and has irregular bowel movements, occurring two to three times a day, generally formed. There is no hematochezia or urinary issues. He eats normally since receiving medication for gastrointestinal symptoms but struggles with food choices. He is currently taking Zofran  for nausea and has about four pills remaining.  He is concerned about increasing forgetfulness and cognitive difficulties. He prefers not to relocate to [place] with his daughter. No fever, vomiting, diarrhea, blood in stool, or urinary issues.   Review of Systems  Constitutional:  Positive for malaise/fatigue. Negative for fever.  Gastrointestinal:  Positive for abdominal pain and nausea. Negative for blood in stool, constipation, diarrhea and vomiting.  Genitourinary:  Negative for dysuria and hematuria.  Musculoskeletal:  Positive for back pain.  Neurological:  Positive for weakness.        Objective:     BP 138/77   Pulse 78   Temp 98.6 F (37 C)   Ht 5' 9 (1.753 m)   Wt 200 lb (90.7 kg)   SpO2  97%   BMI 29.53 kg/m   Physical Exam Constitutional:      General: He is not in acute distress.    Appearance: Normal appearance. He is obese. He is not ill-appearing.  HENT:     Head: Normocephalic and atraumatic.  Cardiovascular:     Rate and Rhythm: Normal rate and regular rhythm.     Heart sounds: Normal heart sounds. No murmur heard. Pulmonary:     Effort: Pulmonary effort is normal.     Breath sounds: Normal breath sounds.  Abdominal:     General: Abdomen is flat. Bowel sounds are normal. There is no distension.     Palpations: Abdomen is soft.     Tenderness: There is no abdominal tenderness. There is no guarding or rebound.  Skin:    General: Skin is warm and dry.     Coloration: Skin is not jaundiced or pale.  Neurological:     General: No focal deficit present.     Mental Status: He is alert.  Psychiatric:        Mood and Affect: Mood normal.        Behavior: Behavior normal.     No results found for any visits on 04/02/24.      Assessment & Plan:  Generalized weakness Assessment & Plan: Weakness worsening since March, coinciding with decreased MS medication. Exacerbated by activity, with mild shortness of breath. MS and aging are likely contributing factors. Neurology follow-up scheduled for December 17th. He appears stable today, no acute findings  on physical exam.  - Monitor symptoms and report any significant changes to PCP, follow up with PCP as scheduled in December. - Warning signs and ER precautions discussed.    Lower abdominal pain Assessment & Plan: Abdominal pain improving post-ER visit. CT scan showed no acute findings. Persistent nausea without vomiting or diarrhea. Bowel movements irregular but not constipated, no diarrhea, no blood in stool. - Repeat CMP and lipase to ensure normalization to baseline post-ER visit. - Continue Zofran  as needed for nausea. - Advise to return if symptoms worsen, such as increased abdominal pain, vomiting,  diarrhea, or fever.   Orders: -     Lipase -     Comprehensive metabolic panel with GFR  Elevated lipase -     Lipase  Immunization due -     Flu vaccine HIGH DOSE PF(Fluzone  Trivalent)    Return if symptoms worsen or fail to improve.  Charmaine Eames Dibiasio, PA-C

## 2024-04-03 ENCOUNTER — Ambulatory Visit: Payer: Self-pay

## 2024-04-03 NOTE — Telephone Encounter (Signed)
 FYI Only or Action Required?: Action required by provider: any additional recommendations for pain.  Patient was last seen in primary care on 04/02/2024 by Grooms, [place], [place].  Called Nurse Triage reporting Abdominal Pain.  Symptoms began several months ago.  Interventions attempted: Prescription medications: Zofran  and Rest, hydration, or home remedies.  Symptoms are: gradually worsening.  Triage Disposition: See PCP When Office is Open (Within 3 Days)  Patient/caregiver understands and will follow disposition?: Yes  Copied from CRM #[id number]. Topic: Clinical - Red Word Triage >> Apr 03, 2024 10:03 AM Antwanette L wrote: Red Word that prompted transfer to Nurse Triage: Pt is calling back in with stomach and neck pain. Pt has been up all night. The pt rates his pain 8/10 Reason for Disposition  [1] MODERATE back pain (e.g., interferes with normal activities) AND [2] present > 3 days  Answer Assessment - Initial Assessment Questions 1. LOCATION: Where does it hurt?      *No Answer* 2. RADIATION: Does the pain shoot anywhere else? (e.g., chest, back)     *No Answer* 3. ONSET: When did the pain begin? (Minutes, hours or days ago)      *No Answer* 4. SUDDEN: Gradual or sudden onset?     *No Answer* 5. PATTERN Does the pain come and go, or is it constant?     *No Answer* 6. SEVERITY: How bad is the pain?  (e.g., Scale 1-10; mild, moderate, or severe)  7. RECURRENT SYMPTOM: Have you ever had this type of stomach pain before? If Yes, ask: When was the last time? and What happened that time?      *No Answer* 8. CAUSE: What do you think is causing the stomach pain? (e.g., gallstones, recent abdominal surgery)     *No Answer* 9. RELIEVING/AGGRAVATING FACTORS: What makes it better or worse? (e.g., antacids, bending or twisting motion, bowel movement)     *No Answer* 10. OTHER SYMPTOMS: Do you have any other symptoms? (e.g., back pain, diarrhea, fever,  urination pain, vomiting)       *No Answer*  Answer Assessment - Initial Assessment Questions Patient seen yesterday in clinic for ongoing abdominal pain and back pain. Abdominal pain is consistent with visit yesterday. Zofran  helping when he gets nauseous. Flank pain bilaterally. Feels different then the abdominal pain. Has been going on for a good while. Denies fever, painful urination, or foul odor. UA at ED clean, Hx of prostate CA followed by Urology.  Hx of MS- Have trouble getting up and down due to Worsened generalized weakness. Uses cane at baseline. Getting tired very easily. Feels like Neurology related with his MS- no appts until December- he has been off his MS infusions for a year and feeling it.  Semi constipated- advised fruits that start with P to assist with BM's, and staying very hydrated.  Taking Extra strength tylenol  for arthritis- 4-6 capsules daily for a couple months to help with abdominal pain and back/flank pain, MS pain.  Advised to reach out to Neurology about work in appt or recommendations on his MS sx. Advised I would send note to office to review for additional recommendations, and appt made for Tuesday per request. Patient will go back to ED this weekend if feeling worse.  1. ONSET: When did the pain begin? (e.g., minutes, hours, days)     A good while  2. LOCATION: Where does it hurt? (upper, mid or lower back)     Flank pain bilateral 3. SEVERITY: How bad is the pain?  (  e.g., Scale 1-10; mild, moderate, or severe)     8-10/10- back  4. PATTERN: Is the pain constant? (e.g., yes, no; constant, intermittent)      Occasionally lessens.  5. RADIATION: Does the pain shoot into your legs or somewhere else?     Ongoing abdominal pain and flank pain  6. CAUSE:  What do you think is causing the back pain?      denies 7. BACK OVERUSE:  Any recent lifting of heavy objects, strenuous work or exercise?     denies 8. MEDICINES: What have you taken so far  for the pain? (e.g., nothing, acetaminophen , NSAIDS)     Xtra Strength Tylenol  Arthritis 9. NEUROLOGIC SYMPTOMS: Do you have any weakness, numbness, or problems with bowel/bladder control?     Hx of MS 10. OTHER SYMPTOMS: Do you have any other symptoms? (e.g., fever, abdomen pain, burning with urination, blood in urine)       Ongoing abdominal pain  Protocols used: Abdominal Pain - Male-A-AH, Back Pain-A-AH

## 2024-04-06 NOTE — Telephone Encounter (Signed)
 Appointment scheduled.

## 2024-04-07 ENCOUNTER — Ambulatory Visit: Admitting: Physician Assistant

## 2024-04-07 ENCOUNTER — Encounter: Payer: Self-pay | Admitting: Physician Assistant

## 2024-04-07 ENCOUNTER — Ambulatory Visit (HOSPITAL_COMMUNITY)
Admission: RE | Admit: 2024-04-07 | Discharge: 2024-04-07 | Disposition: A | Discharge status: Home / Self Care | Source: Ambulatory Visit | Attending: Physician Assistant | Admitting: Physician Assistant

## 2024-04-07 VITALS — BP 143/75 | HR 84 | Temp 98.2°F | Ht 69.0 in | Wt 197.8 lb

## 2024-04-07 DIAGNOSIS — K5901 Slow transit constipation: Secondary | ICD-10-CM

## 2024-04-07 DIAGNOSIS — R109 Unspecified abdominal pain: Secondary | ICD-10-CM | POA: Diagnosis not present

## 2024-04-07 DIAGNOSIS — K5900 Constipation, unspecified: Secondary | ICD-10-CM | POA: Diagnosis not present

## 2024-04-07 NOTE — Patient Instructions (Signed)
 MiraLax  and Colace for constipation  MiraLax  1 capful in 6-8 oz of fluid every hour for 6 hours today. May repeat tomorrow if no relief.  - Otherwise MiraLax  once daily.

## 2024-04-07 NOTE — Assessment & Plan Note (Signed)
 Reports difficulty with bowel movements, hard stools, and inadequate fluid and fiber intake. Reassuring abdominal exam.  - Recommended Miralax : one capful in 6-8 ounces of fluid every hour for six hours until bowel movement occurs; repeat next day if no relief. - Advised Colace to maintain soft stools. - Advised increased intake of fruits, vegetables, and water . - KUB to assess for air levels and blockages. - Instructed to report blood in stool, persistent fevers, vomiting, or lethargy.

## 2024-04-07 NOTE — Progress Notes (Signed)
 Acute Office Visit  Subjective:     Patient ID: Kerry Solis, male    DOB: 06-13-1946, 78 y.o.   MRN: [id number]   Discussed the use of AI scribe software for clinical note transcription with the patient, who gave verbal consent to proceed.  History of Present Illness Kerry Solis is an 78 year old male who presents with constipation and abdominal pain.  He experiences significant constipation with abdominal pain, localized to his lower abdomen. He has the urge to defecate but produces only small amounts of hard stool, with no blood present. Nausea is present without vomiting. He takes Zofran  as needed for nausea. He has not checked his temperature recently and is unsure about having a fever.  He acknowledges inadequate fluid intake and insufficient consumption of fruits and vegetables. His symptoms have significantly impacted his daily activities, causing him to miss church for three consecutive Sundays.   Review of Systems  Constitutional:  Positive for malaise/fatigue. Negative for fever.  Gastrointestinal:  Positive for abdominal pain, constipation and nausea. Negative for blood in stool, diarrhea and vomiting.  Genitourinary:  Negative for dysuria, frequency and urgency.        Objective:     BP (!) 143/75 (BP Location: Left Arm, Patient Position: Sitting)   Pulse 84   Temp 98.2 F (36.8 C)   Ht 5' 9 (1.753 m)   Wt 197 lb 12 oz (89.7 kg)   SpO2 98%   BMI 29.20 kg/m   Physical Exam Constitutional:      General: He is not in acute distress.    Appearance: Normal appearance. He is obese. He is not ill-appearing.  HENT:     Head: Normocephalic and atraumatic.     Mouth/Throat:     Mouth: Mucous membranes are moist.     Pharynx: Oropharynx is clear.  Eyes:     Extraocular Movements: Extraocular movements intact.     Conjunctiva/sclera: Conjunctivae normal.  Cardiovascular:     Rate and Rhythm: Normal rate and regular rhythm.     Heart sounds: Normal heart  sounds. No murmur heard. Pulmonary:     Effort: Pulmonary effort is normal.     Breath sounds: Normal breath sounds.  Abdominal:     General: Abdomen is flat. Bowel sounds are normal. There is no distension.     Palpations: Abdomen is soft. There is no mass.     Tenderness: There is no abdominal tenderness. There is no guarding.  Skin:    General: Skin is warm and dry.  Neurological:     General: No focal deficit present.     Mental Status: He is alert and oriented to person, place, and time.  Psychiatric:        Mood and Affect: Mood normal.        Behavior: Behavior normal.     No results found for any visits on 04/07/24.      Assessment & Plan:  Slow transit constipation Assessment & Plan: Reports difficulty with bowel movements, hard stools, and inadequate fluid and fiber intake. Reassuring abdominal exam.  - Recommended Miralax : one capful in 6-8 ounces of fluid every hour for six hours until bowel movement occurs; repeat next day if no relief. - Advised Colace to maintain soft stools. - Advised increased intake of fruits, vegetables, and water . - KUB to assess for air levels and blockages. - Instructed to report blood in stool, persistent fevers, vomiting, or lethargy.   Orders: -  DG Abd 1 View    Return if symptoms worsen or fail to improve.  Charmaine Luisantonio Adinolfi, PA-C

## 2024-04-08 ENCOUNTER — Ambulatory Visit: Payer: Self-pay | Admitting: Physician Assistant

## 2024-04-13 NOTE — Telephone Encounter (Signed)
-----   Message from [place] Grooms sent at 04/08/2024 11:59 AM EDT ----- X-ray shows moderate constipation as expected. No signs of obstruction. Continue MiraLax  as discussed.  ----- Message ----- From: Interface, Rad Results In Sent: 04/07/2024   1:53 PM EDT To: Charmaine Grooms, PA-C

## 2024-04-13 NOTE — Telephone Encounter (Signed)
 Left message for patient to call back to discuss results and recommendations.  Ok for E2C2 to discuss.

## 2024-04-22 ENCOUNTER — Other Ambulatory Visit: Payer: Self-pay | Admitting: Family Medicine

## 2024-04-23 NOTE — Progress Notes (Signed)
Patient has been informed per provider result notes and recommendations, pt verbalizes understanding.   °

## 2024-04-24 ENCOUNTER — Other Ambulatory Visit: Payer: Self-pay | Admitting: Family Medicine

## 2024-06-01 ENCOUNTER — Ambulatory Visit (INDEPENDENT_AMBULATORY_CARE_PROVIDER_SITE_OTHER): Admitting: Family Medicine

## 2024-06-01 VITALS — BP 136/87 | Ht 69.0 in | Wt 198.4 lb

## 2024-06-01 DIAGNOSIS — B20 Human immunodeficiency virus [HIV] disease: Secondary | ICD-10-CM

## 2024-06-01 DIAGNOSIS — G35D Multiple sclerosis, unspecified: Secondary | ICD-10-CM | POA: Diagnosis not present

## 2024-06-01 DIAGNOSIS — R531 Weakness: Secondary | ICD-10-CM

## 2024-06-01 DIAGNOSIS — E1169 Type 2 diabetes mellitus with other specified complication: Secondary | ICD-10-CM

## 2024-06-01 DIAGNOSIS — E119 Type 2 diabetes mellitus without complications: Secondary | ICD-10-CM | POA: Diagnosis not present

## 2024-06-01 DIAGNOSIS — E785 Hyperlipidemia, unspecified: Secondary | ICD-10-CM

## 2024-06-01 DIAGNOSIS — C61 Malignant neoplasm of prostate: Secondary | ICD-10-CM

## 2024-06-01 MED ORDER — AMOXICILLIN-POT CLAVULANATE 875-125 MG PO TABS: 1.0000 | ORAL_TABLET | Freq: Two times a day (BID) | ORAL | 0 refills | Status: DC

## 2024-06-01 NOTE — Patient Instructions (Addendum)
 Prescription-I sent a prescription of antibiotics to [facility] pharmacy  Please do your lab work later this month or early January  We will see you for a follow-up visit in approximately 4 to 5 months  Please take care-Dr. Glendia

## 2024-06-01 NOTE — Progress Notes (Signed)
° °  Subjective:    Patient ID: Kerry Solis, male    DOB: June 18, 1946, 78 y.o.   MRN: [id number]  HPI  Discussed the use of AI scribe software for clinical note transcription with the patient, who gave verbal consent to proceed.  History of Present Illness   Kerry Solis is an 78 year old male with multiple sclerosis who presents with fatigue, low energy, and respiratory symptoms.  He has been experiencing significant fatigue and low energy levels, describing it as feeling 'like I've been running or something' when moving around. He often feels 'real tired' and needs to sit down frequently to rest. Despite not taking naps during the day, he typically goes to bed around 10 or 11 PM but does not sleep well, stating 'I do not sleep.'  For the past couple of weeks, he has had respiratory symptoms, including a 'nasty cold' with a cough producing thick, yellow sputum. His breathing is labored, similar to 'like I've been running or something,' and he has experienced some chest discomfort, though he does not believe it is significant.  He is experiencing constipation, with bowel movements occurring only every couple of days. He uses a powder laxative daily to help with bowel movements.  His appetite is poor, saying 'if I fix it, I don't eat,' indicating a lack of interest in food preparation and consumption.  He has a history of multiple sclerosis and has been seeing a neurologist, although he feels that he is not being seen as frequently as needed. He also saw an infectious disease specialist in October, who reported that he was doing well.  He manages his medications with the help of a packet system to avoid taking them twice, although he occasionally forgets to take them. He gets his medications from [facility].  Socially, he lives alone and has a daughter who helps with his finances and visits from [place]. He has some family conflicts, particularly with his son's wife, and feels he cannot  trust many people around him. 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       Review of Systems     Objective:   Physical Exam General-in no acute distress Eyes-no discharge Lungs-respiratory rate normal, CTA CV-no murmurs,RRR Extremities skin warm dry no edema Neuro grossly normal Behavior normal, alert        Assessment & Plan:  Assessment and Plan    Type 2 diabetes mellitus Due for A1c test to assess control. - Ordered A1c test within 30 days.  Chronic constipation Chronic constipation managed with daily powder. - Continue current bowel regimen with daily powder.  General health maintenance Emphasized regular follow-up and monitoring. - Scheduled follow-up in four to five months.  MS-followed by specialist sees them every 6 to 9 months  HIV under good control was followed by specialist every 4 months  Patient will do lab work somewhere within the next 30 to 60 days

## 2024-06-19 ENCOUNTER — Ambulatory Visit: Payer: Medicare Other

## 2024-06-19 VITALS — BP 138/74 | Ht 69.0 in | Wt 199.0 lb

## 2024-06-19 DIAGNOSIS — Z0000 Encounter for general adult medical examination without abnormal findings: Secondary | ICD-10-CM

## 2024-06-19 NOTE — Patient Instructions (Addendum)
 Mr. Bubb,  Thank you for taking the time for your Medicare Wellness Visit. I appreciate your continued commitment to your health goals. Please review the care plan we discussed, and feel free to reach out if I can assist you further.  Please note that Annual Wellness Visits do not include a physical exam. Some assessments may be limited, especially if the visit was conducted virtually. If needed, we may recommend an in-person follow-up with your provider.  Ongoing Care Seeing your primary care provider every 3 to 6 months helps us  monitor your health and provide consistent, personalized care.   Referrals If a referral was made during today's visit and you haven't received any updates within two weeks, please contact the referred provider directly to check on the status.  Recommended Screenings:  Health Maintenance  Topic Date Due   DTaP/Tdap/Td vaccine (1 - Tdap) Never done   Complete foot exam   03/14/2023   Yearly kidney health urinalysis for diabetes  12/17/2023   Medicare Annual Wellness Visit  06/13/2024   Hemoglobin A1C  06/28/2024   Eye exam for diabetics  07/24/2024   Yearly kidney function blood test for diabetes  03/29/2025   Pneumococcal Vaccine for age over 46  Completed   Flu Shot  Completed   Zoster (Shingles) Vaccine  Completed   Meningitis B Vaccine  Aged Out   Hepatitis B Vaccine  Discontinued   Colon Cancer Screening  Discontinued   COVID-19 Vaccine  Discontinued   Hepatitis C Screening  Discontinued       03/29/2024   11:03 AM  Advanced Directives  Does Patient Have a Medical Advance Directive? Yes  Type of Estate Agent of [place];Living will  Copy of Healthcare Power of Attorney in Chart? No - copy requested   Information on Advanced Care Planning can be found at [place]  Secretary of [facility] Advance Health Care Directives Advance Health Care Directives ([email or web address]   Vision: Annual vision screenings are recommended for early  detection of glaucoma, cataracts, and diabetic retinopathy. These exams can also reveal signs of chronic conditions such as diabetes and high blood pressure.  Dental: Annual dental screenings help detect early signs of oral cancer, gum disease, and other conditions linked to overall health, including heart disease and diabetes.  Please see the attached documents for additional preventive care recommendations.

## 2024-06-19 NOTE — Progress Notes (Signed)
 "  Chief Complaint  Patient presents with   Medicare Wellness     Subjective:   Kerry Solis is a 79 y.o. male who presents for a Medicare Annual Wellness Visit.  Visit info / Clinical Intake: Medicare Wellness Visit Type:: Subsequent Annual Wellness Visit Persons participating in visit and providing information:: patient Medicare Wellness Visit Mode:: In-person (required for WTM) Interpreter Needed?: No Pre-visit prep was completed: yes AWV questionnaire completed by patient prior to visit?: no Living arrangements:: (!) lives alone Patient's Overall Health Status Rating: good Typical amount of pain: some Does pain affect daily life?: no Are you currently prescribed opioids?: no  Dietary Habits and Nutritional Risks How many meals a day?: 2 Eats fruit and vegetables daily?: yes Most meals are obtained by: preparing own meals In the last 2 weeks, have you had any of the following?: none Diabetic:: no  Functional Status Activities of Daily Living (to include ambulation/medication): Independent Ambulation: Independent with device- listed below Home Assistive Devices/Equipment: Cane Medication Administration: Independent Home Management (perform basic housework or laundry): Independent Manage your own finances?: (!) no Primary transportation is: driving Concerns about vision?: no *vision screening is required for WTM* Concerns about hearing?: no  Fall Screening Falls in the past year?: 1 Number of falls in past year: 1 Was there an injury with Fall?: 0 Fall Risk Category Calculator: 2 Patient Fall Risk Level: Moderate Fall Risk  Fall Risk Patient at Risk for Falls Due to: History of fall(s); Impaired balance/gait; Impaired mobility Fall risk Follow up: Falls prevention discussed; Education provided; Falls evaluation completed  Home and Transportation Safety: All rugs have non-skid backing?: yes All stairs or steps have railings?: yes Grab bars in the bathtub or  shower?: yes Have non-skid surface in bathtub or shower?: yes Good home lighting?: yes Regular seat belt use?: yes Hospital stays in the last year:: no  Cognitive Assessment Difficulty concentrating, remembering, or making decisions? : yes Will 6CIT or Mini Cog be Completed: yes What year is it?: 0 points What month is it?: 0 points Give patient an address phrase to remember (5 components): [address] [place] About what time is it?: 0 points Count backwards from 20 to 1: 2 points Say the months of the year in reverse: 2 points Repeat the address phrase from earlier: 2 points 6 CIT Score: 6 points  Advance Directives (For Healthcare) Does Patient Have a Medical Advance Directive?: No Type of Advance Directive: Healthcare Power of [place]; Living will Copy of Healthcare Power of Attorney in Chart?: No - copy requested Copy of Living Will in Chart?: No - copy requested Would patient like information on creating a medical advance directive?: Yes (MAU/Ambulatory/Procedural Areas - Information given)  Reviewed/Updated  Reviewed/Updated: Reviewed All (Medical, Surgical, Family, Medications, Allergies, Care Teams, Patient Goals)    Allergies (verified) Pravastatin  and Yellow jacket venom   Current Medications (verified) Outpatient Encounter Medications as of 06/19/2024  Medication Sig   allopurinol  (ZYLOPRIM ) 100 MG tablet TAKE ONE TABLET BY MOUTH ONCE DAILY FOR GOUT   amLODipine  (NORVASC ) 5 MG tablet TAKE ONE TABLET BY MOUTH ONCE DAILY FOR HIGH BLOOD PRESSURE   azelastine  (ASTELIN ) 0.1 % nasal spray Place 2 sprays into both nostrils 2 (two) times daily. Use in each nostril as directed   DESCOVY  200-25 MG tablet Take 1 tablet by mouth daily.   ezetimibe  (ZETIA ) 10 MG tablet TAKE ONE TABLET BY MOUTH ONCE DAILY   fluticasone  (FLONASE ) 50 MCG/ACT nasal spray Place 2 sprays  into both nostrils in the morning and at bedtime.   gabapentin  (NEURONTIN ) 100 MG capsule Take 1 capsule by  mouth 3 (three) times daily.   latanoprost  (XALATAN ) 0.005 % ophthalmic solution INSTILL 1 DROP INTO EACH EYE AT BEDTIME   metFORMIN  (GLUCOPHAGE -XR) 500 MG 24 hr tablet Take 1 tablet (500 mg total) by mouth daily with breakfast.   ondansetron  (ZOFRAN -ODT) 4 MG disintegrating tablet Take 1 tablet (4 mg total) by mouth every 8 (eight) hours as needed.   pantoprazole  (PROTONIX ) 40 MG tablet Take 1 tablet (40 mg total) by mouth daily.   silodosin  (RAPAFLO ) 8 MG CAPS capsule Take 1 capsule (8 mg total) by mouth at bedtime.   TIVICAY  50 MG tablet Take 50 mg by mouth daily.   amoxicillin -clavulanate (AUGMENTIN ) 875-125 MG tablet Take 1 tablet by mouth 2 (two) times daily.   No facility-administered encounter medications on file as of 06/19/2024.    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   Depression    Elevated liver enzymes    Glaucoma    High triglycerides    History of gout    HIV (human immunodeficiency virus infection) (HCC)    Hypertension    Leukopenia    Low CD4   Multiple sclerosis    uses a cane   Myocardial infarction (HCC)    45 years ago   Prostate cancer [facility])    Past Surgical History:  Procedure Laterality Date   BIOPSY  08/14/2016   Procedure: BIOPSY;  Surgeon: [name], MD;  [facility];  Service: Endoscopy;;  gastric bx's   CHOLECYSTECTOMY  06/10/2011   Procedure: LAPAROSCOPIC CHOLECYSTECTOMY;  Surgeon: [name], MD;  Location: [facility];  Service: General;  Laterality: N/A;   COLONOSCOPY  03/10/2009   DOQ:fnizmjuz internal hemorrhoids/6-mm sessile ascending colon polyp/tortuous colon, diverticulosis. TA   COLONOSCOPY WITH PROPOFOL  N/A 04/19/2015   Dr. Haddock: sessile serrated adenoma, surveillance in 5 years    ESOPHAGOGASTRODUODENOSCOPY  06/23/2008   DOQ:rymnwpr gastritis/schatzki ring   ESOPHAGOGASTRODUODENOSCOPY (EGD) WITH PROPOFOL  N/A 08/14/2016    Dr. Haddock: moderate Schatzi's ring s/p dilation, LA Grade A esophagitis,small hiatal hernia, chronic gastritis (negative H.pylori), one duodenal diverticulum   LAPAROSCOPIC APPENDECTOMY N/A 12/13/2017   Procedure: APPENDECTOMY LAPAROSCOPIC;  Surgeon: [name], MD;  Location: [facility];  Service: General;  Laterality: N/A;   NECK SURGERY  unsure   disc   POLYPECTOMY N/A 04/19/2015   Procedure: POLYPECTOMY;  Surgeon: [name], MD;  Location: [facility];  Service: Endoscopy;  Laterality: N/A;  ascending colon   PROSTATE BIOPSY     RADIOACTIVE SEED IMPLANT N/A 11/29/2016   Procedure: RADIOACTIVE SEED IMPLANT/BRACHYTHERAPY IMPLANT;  Surgeon: [name], MD;  Location[facility];  Service: Urology;  Laterality: N/A;  81 seeds implanted   SAVORY DILATION N/A 08/14/2016   Procedure: SAVORY DILATION;  Surgeon: [name], MD;  [facility];  Service: Endoscopy;  Laterality: N/A;   Family History  Problem Relation Age of Onset   Hypertension Mother    Heart attack Mother    Heart attack Father    Cancer Sister        Breast   Diabetes Sister    Diabetes Brother    Cancer Brother        unknown   Cancer Brother  colon   Cancer Sister        breast   Cancer Brother        prostate   Cancer Brother        colon   Social History   Occupational History   Not on file  Tobacco Use   Smoking status: Never   Smokeless tobacco: Never  Vaping Use   Vaping status: Never Used  Substance and Sexual Activity   Alcohol use: No   Drug use: No   Sexual activity: Never    Birth control/protection: Abstinence   Tobacco Counseling Counseling given: Not Answered  SDOH Screenings   Food Insecurity: No Food Insecurity (06/19/2024)  Housing: Low Risk (06/19/2024)  Transportation Needs: No Transportation Needs (06/19/2024)  Utilities: Not At Risk (06/19/2024)  Alcohol Screen: Low Risk (06/14/2023)  Depression (PHQ2-9): High Risk (06/19/2024)  Financial  Resource Strain: Low Risk (06/14/2023)  Physical Activity: Insufficiently Active (06/19/2024)  Social Connections: Moderately Isolated (06/19/2024)  Stress: No Stress Concern Present (06/19/2024)  Tobacco Use: Low Risk (06/19/2024)  Health Literacy: Inadequate Health Literacy (06/19/2024)   See flowsheets for full screening details  Depression Screen PHQ 2 & 9 Depression Scale- Over the past 2 weeks, how often have you been bothered by any of the following problems? Little interest or pleasure in doing things: 2 Feeling down, depressed, or hopeless (PHQ Adolescent also includes...irritable): 2 PHQ-2 Total Score: 4 Trouble falling or staying asleep, or sleeping too much: 3 Feeling tired or having little energy: 3 Poor appetite or overeating (PHQ Adolescent also includes...weight loss): 1 Feeling bad about yourself - or that you are a failure or have let yourself or your family down: 2 Trouble concentrating on things, such as reading the newspaper or watching television (PHQ Adolescent also includes...like school work): 2 Moving or speaking so slowly that other people could have noticed. Or the opposite - being so fidgety or restless that you have been moving around a lot more than usual: 0 Thoughts that you would be better off dead, or of hurting yourself in some way: 0 PHQ-9 Total Score: 15 If you checked off any problems, how difficult have these problems made it for you to do your work, take care of things at home, or get along with other people?: Very difficult  Depression Treatment Depression Interventions/Treatment : Counseling     Goals Addressed             This Visit's Progress    Prevent falls   On track            Objective:    Today's Vitals   06/19/24 1123  BP: 138/74  Weight: 199 lb (90.3 kg)  Height: 5' 9 (1.753 m)   Body mass index is 29.39 kg/m.  Hearing/Vision screen Hearing Screening - Comments:: Patient is able to hear conversational tones without  difficulty. No issues reported.   Vision Screening - Comments:: Wears rx glasses - up to date with routine eye exams with [facility]  Immunizations and Health Maintenance Health Maintenance  Topic Date Due   DTaP/Tdap/Td (1 - Tdap) Never done   FOOT EXAM  03/14/2023   Diabetic kidney evaluation - Urine ACR  12/17/2023   HEMOGLOBIN A1C  06/28/2024   OPHTHALMOLOGY EXAM  07/24/2024   Diabetic kidney evaluation - eGFR measurement  03/29/2025   Medicare Annual Wellness (AWV)  06/19/2025   Pneumococcal Vaccine: 50+ Years  Completed   Influenza Vaccine  Completed   Zoster Vaccines-  Shingrix   Completed   Meningococcal B Vaccine  Aged Out   Hepatitis B Vaccines 19-59 Average Risk  Discontinued   Colonoscopy  Discontinued   COVID-19 Vaccine  Discontinued   Hepatitis C Screening  Discontinued        Assessment/Plan:  This is a routine wellness examination for [place].  Patient Care Team: Alphonsa Glendia LABOR, MD as PCP - General (Family Medicine) Cindie Carlin POUR, DO as Consulting Physician (Internal Medicine) Aura Mliss LABOR, RN as [facility] Care Management Clarice Perkins, MD as Referring Physician (Neurology) Sherrilee Belvie CROME, MD as Consulting Physician (Urology) Alspaugh, Lynwood Barter, MD as Referring Physician (Infectious Diseases) Frances Ozell RAMAN, LCSW as [facility] Care Management (Licensed Clinical Social Worker) Claudene Muskrat, OD (Optometry)  I have personally reviewed and noted the following in the patients chart:   Medical and social history Use of alcohol, tobacco or illicit drugs  Current medications and supplements including opioid prescriptions. Functional ability and status Nutritional status Physical activity Advanced directives List of other physicians Hospitalizations, surgeries, and ER visits in previous 12 months Vitals Screenings to include cognitive, depression, and falls Referrals and appointments  No  orders of the defined types were placed in this encounter.  In addition, I have reviewed and discussed with patient certain preventive protocols, quality metrics, and best practice recommendations. A written personalized care plan for preventive services as well as general preventive health recommendations were provided to patient.   Lavelle Charmaine Browner, LPN   01/23/7972   Return in 1 year (on 06/19/2025).  After Visit Summary: (In Person-Printed) AVS printed and given to the patient  Nurse Notes: Would like to discuss memory with pcp at next office visit  "

## 2024-06-26 ENCOUNTER — Other Ambulatory Visit: Payer: Self-pay | Admitting: Family Medicine

## 2024-06-26 DIAGNOSIS — K219 Gastro-esophageal reflux disease without esophagitis: Secondary | ICD-10-CM

## 2024-07-02 LAB — HEMOGLOBIN A1C
Est. average glucose Bld gHb Est-mCnc: 146 mg/dL
Hgb A1c MFr Bld: 6.7 % — ABNORMAL HIGH (ref 4.8–5.6)

## 2024-07-02 LAB — LIPID PANEL
Chol/HDL Ratio: 3.7 ratio (ref 0.0–5.0)
Cholesterol, Total: 176 mg/dL (ref 100–199)
HDL: 47 mg/dL
LDL Chol Calc (NIH): 111 mg/dL — ABNORMAL HIGH (ref 0–99)
Triglycerides: 97 mg/dL (ref 0–149)
VLDL Cholesterol Cal: 18 mg/dL (ref 5–40)

## 2024-07-02 LAB — COMPREHENSIVE METABOLIC PANEL WITH GFR
ALT: 23 IU/L (ref 0–44)
AST: 28 IU/L (ref 0–40)
Albumin: 4.6 g/dL (ref 3.8–4.8)
Alkaline Phosphatase: 80 IU/L (ref 47–123)
BUN/Creatinine Ratio: 8 — ABNORMAL LOW (ref 10–24)
BUN: 10 mg/dL (ref 8–27)
Bilirubin Total: 0.5 mg/dL (ref 0.0–1.2)
CO2: 22 mmol/L (ref 20–29)
Calcium: 9.8 mg/dL (ref 8.6–10.2)
Chloride: 107 mmol/L — ABNORMAL HIGH (ref 96–106)
Creatinine, Ser: 1.3 mg/dL — ABNORMAL HIGH (ref 0.76–1.27)
Globulin, Total: 2.5 g/dL (ref 1.5–4.5)
Glucose: 102 mg/dL — ABNORMAL HIGH (ref 70–99)
Potassium: 4.8 mmol/L (ref 3.5–5.2)
Sodium: 147 mmol/L — ABNORMAL HIGH (ref 134–144)
Total Protein: 7.1 g/dL (ref 6.0–8.5)
eGFR: 56 mL/min/1.73 — ABNORMAL LOW

## 2024-07-02 LAB — MICROALBUMIN / CREATININE URINE RATIO
Creatinine, Urine: 78.1 mg/dL
Microalb/Creat Ratio: 70 mg/g{creat} — ABNORMAL HIGH (ref 0–29)
Microalbumin, Urine: 54.4 ug/mL

## 2024-07-03 ENCOUNTER — Ambulatory Visit: Payer: Self-pay | Admitting: Family Medicine

## 2024-07-07 ENCOUNTER — Ambulatory Visit: Payer: Self-pay

## 2024-07-10 ENCOUNTER — Ambulatory Visit: Admitting: Family Medicine

## 2024-07-10 VITALS — BP 126/62 | HR 62 | Temp 99.2°F | Ht 69.0 in | Wt 195.0 lb

## 2024-07-10 DIAGNOSIS — I10 Essential (primary) hypertension: Secondary | ICD-10-CM

## 2024-07-10 DIAGNOSIS — R413 Other amnesia: Secondary | ICD-10-CM | POA: Diagnosis not present

## 2024-07-10 DIAGNOSIS — G35D Multiple sclerosis, unspecified: Secondary | ICD-10-CM | POA: Diagnosis not present

## 2024-07-10 DIAGNOSIS — E119 Type 2 diabetes mellitus without complications: Secondary | ICD-10-CM

## 2024-07-10 MED ORDER — VALSARTAN 40 MG PO TABS: 40.0000 mg | ORAL_TABLET | Freq: Every day | ORAL | 5 refills | Status: AC

## 2024-07-10 MED ORDER — AMLODIPINE BESYLATE 2.5 MG PO TABS: ORAL_TABLET | ORAL | 5 refills | Status: AC

## 2024-07-10 NOTE — Progress Notes (Signed)
 Rales no close months also oh but I guess we have to be ready for the sleep maybe I think that through they do not really get the powder a lot when you I want to do  Subjective:    Patient ID: Kerry Solis, male    DOB: 1946/01/04, 79 y.o.   MRN: [id number]  HPI Patient is here for follow up Patient states he is stressed He stays by himself Grandkids help him out His daughter helps him with finances His stress is mainly because recently he had to go back on methylprednisolone  for his MS and that caused some sleep interruption and increased appetite He is trying to take good care of himself take his medicines He does suffer with some mild short-term memory but denies any severe cognitive changes he is able to drive during the day safely he avoids driving at night Stays by himself feels like he is doing okay in that regard Patient stated he is having a lot of memory loss and being stressed about a lot of things   Results for orders placed or performed in visit on 06/01/24  Hemoglobin A1c   Collection Time: 07/01/24  9:06 AM  Result Value Ref Range   Hgb A1c MFr Bld 6.7 (H) 4.8 - 5.6 %   Est. average glucose Bld gHb Est-mCnc 146 mg/dL  Comprehensive metabolic panel with GFR   Collection Time: 07/01/24  9:06 AM  Result Value Ref Range   Glucose 102 (H) 70 - 99 mg/dL   BUN 10 8 - 27 mg/dL   Creatinine, Ser 8.69 (H) 0.76 - 1.27 mg/dL   eGFR 56 (L) >40 fO/fpw/8.26   BUN/Creatinine Ratio 8 (L) 10 - 24   Sodium 147 (H) 134 - 144 mmol/L   Potassium 4.8 3.5 - 5.2 mmol/L   Chloride 107 (H) 96 - 106 mmol/L   CO2 22 20 - 29 mmol/L   Calcium  9.8 8.6 - 10.2 mg/dL   Total Protein 7.1 6.0 - 8.5 g/dL   Albumin 4.6 3.8 - 4.8 g/dL   Globulin, Total 2.5 1.5 - 4.5 g/dL   Bilirubin Total 0.5 0.0 - 1.2 mg/dL   Alkaline Phosphatase 80 47 - 123 IU/L   AST 28 0 - 40 IU/L   ALT 23 0 - 44 IU/L  Microalbumin/Creatinine Ratio, Urine   Collection Time: 07/01/24  9:06 AM  Result Value Ref Range    Creatinine, Urine 78.1 Not Estab. mg/dL   Microalbumin, Urine 45.5 Not Estab. ug/mL   Microalb/Creat Ratio 70 (H) 0 - 29 mg/g creat  Lipid Panel   Collection Time: 07/01/24  9:06 AM  Result Value Ref Range   Cholesterol, Total 176 100 - 199 mg/dL   Triglycerides 97 0 - 149 mg/dL   HDL 47 >60 mg/dL   VLDL Cholesterol Cal 18 5 - 40 mg/dL   LDL Chol Calc (NIH) 888 (H) 0 - 99 mg/dL   Chol/HDL Ratio 3.7 0.0 - 5.0 ratio   Recent lab work reviewed my main concern is mild increased protein as well as creatinine given him being a diabetic it would be reasonable to make some shifts in his medication Review of Systems     Objective:   Physical Exam General-in no acute distress Eyes-no discharge Lungs-respiratory rate normal, CTA CV-no murmurs,RRR Extremities skin warm dry no edema Neuro grossly normal Behavior normal, alert        Assessment & Plan:  1. Primary hypertension (Primary) Blood pressure decent control Check lab  work Because his most recent lab work showed elevated creatinine along with protein in the urine we are making some changes Reduce amlodipine  from 5 mg down to 2.5 Start valsartan  40 mg daily Blood pressure recheck with our providers in a few weeks Standard follow-up already scheduled in May May need to adjust medicines based on lab work and blood pressure Goal is to try to preserve kidney function - Basic metabolic panel with GFR  2. Diabetes mellitus without complication (HCC) Decent A1c control reasonable continue everything as is - Basic metabolic panel with GFR  3. Multiple sclerosis They are starting treatment with Solu-Medrol  every 3 months to try to help him with his MS flareup this could affect his diabetes we will monitor  4. Poor short term memory Patient does not have dementia but does have some short-term memory and cognitive dysfunction he is safe to drive during the day and is able to go to grocery store and manage his household his daughter  helps him out with finances and grandkids help him out with some of his housework

## 2024-07-10 NOTE — Patient Instructions (Addendum)
 Hi Kerry Solis  It was good to see you today We reviewed over your lab work today.  Overall your A1c under good control at 6.7.  But your kidneys show a slight decline with function with a small amount of protein in the urine.  Because of this we need to make some changes with your medications.  Amlodipine -we will reduce the dose from 5 mg down to a new dose of 2.5 mg Valsartan -we will start this at 40 mg once a day which is a low dose  It is important for you to do blood work in approximately 2 weeks to recheck your kidney functions  You have a standard office visit with me in May But we will also do a blood pressure check with our nurse practitioner in 3 weeks  If you are having any problems or issues please reach out  We will see you soon [facility]-Dr. Glendia

## 2024-07-31 ENCOUNTER — Ambulatory Visit

## 2024-10-30 ENCOUNTER — Ambulatory Visit: Admitting: Family Medicine

## 2025-02-23 ENCOUNTER — Other Ambulatory Visit

## 2025-03-03 ENCOUNTER — Ambulatory Visit: Admitting: Urology
